# Patient Record
Sex: Male | Born: 1945
Health system: Southern US, Community
[De-identification: ages and names within clinical notes are randomized; demographics above are authoritative.]

## PROBLEM LIST (undated history)

## (undated) DIAGNOSIS — K219 Gastro-esophageal reflux disease without esophagitis: Secondary | ICD-10-CM

## (undated) DIAGNOSIS — E785 Hyperlipidemia, unspecified: Secondary | ICD-10-CM

## (undated) DIAGNOSIS — I10 Essential (primary) hypertension: Secondary | ICD-10-CM

## (undated) DIAGNOSIS — K589 Irritable bowel syndrome without diarrhea: Secondary | ICD-10-CM

## (undated) DIAGNOSIS — M1990 Unspecified osteoarthritis, unspecified site: Secondary | ICD-10-CM

## (undated) DIAGNOSIS — F419 Anxiety disorder, unspecified: Secondary | ICD-10-CM

## (undated) DIAGNOSIS — S83242A Other tear of medial meniscus, current injury, left knee, initial encounter: Secondary | ICD-10-CM

## (undated) HISTORY — PX: NASAL SINUS SURGERY: SHX719

## (undated) HISTORY — PX: CHOLECYSTECTOMY: SHX55

## (undated) HISTORY — DX: Essential (primary) hypertension: I10

## (undated) HISTORY — DX: Hyperlipidemia, unspecified: E78.5

## (undated) HISTORY — DX: Irritable bowel syndrome, unspecified: K58.9

---

## 2002-05-10 ENCOUNTER — Ambulatory Visit (HOSPITAL_BASED_OUTPATIENT_CLINIC_OR_DEPARTMENT_OTHER): Admission: RE | Admit: 2002-05-10 | Discharge: 2002-05-10 | Payer: Self-pay | Admitting: *Deleted

## 2002-10-14 ENCOUNTER — Encounter: Payer: Self-pay | Admitting: Emergency Medicine

## 2002-10-14 ENCOUNTER — Inpatient Hospital Stay (HOSPITAL_COMMUNITY): Admission: AD | Admit: 2002-10-14 | Discharge: 2002-10-15 | Payer: Self-pay | Admitting: Emergency Medicine

## 2002-10-15 ENCOUNTER — Encounter: Payer: Self-pay | Admitting: Internal Medicine

## 2004-02-05 ENCOUNTER — Ambulatory Visit: Payer: Self-pay | Admitting: Family Medicine

## 2004-04-20 ENCOUNTER — Ambulatory Visit: Payer: Self-pay | Admitting: Family Medicine

## 2004-05-03 ENCOUNTER — Ambulatory Visit: Payer: Self-pay | Admitting: Family Medicine

## 2004-05-31 ENCOUNTER — Ambulatory Visit: Payer: Self-pay | Admitting: Family Medicine

## 2004-08-20 ENCOUNTER — Ambulatory Visit: Payer: Self-pay | Admitting: Family Medicine

## 2004-11-25 ENCOUNTER — Ambulatory Visit: Payer: Self-pay | Admitting: Family Medicine

## 2005-01-17 ENCOUNTER — Ambulatory Visit: Payer: Self-pay | Admitting: Family Medicine

## 2005-03-24 ENCOUNTER — Ambulatory Visit: Payer: Self-pay | Admitting: Family Medicine

## 2005-04-19 ENCOUNTER — Ambulatory Visit: Payer: Self-pay | Admitting: Family Medicine

## 2005-05-20 ENCOUNTER — Ambulatory Visit: Payer: Self-pay | Admitting: Family Medicine

## 2005-08-17 ENCOUNTER — Ambulatory Visit: Payer: Self-pay | Admitting: Family Medicine

## 2005-09-06 ENCOUNTER — Ambulatory Visit: Payer: Self-pay | Admitting: Family Medicine

## 2006-03-21 ENCOUNTER — Ambulatory Visit: Payer: Self-pay | Admitting: Family Medicine

## 2006-04-03 ENCOUNTER — Ambulatory Visit: Payer: Self-pay | Admitting: Family Medicine

## 2006-04-03 LAB — CONVERTED CEMR LAB
ALT: 25 units/L (ref 0–40)
AST: 22 units/L (ref 0–37)
Albumin: 3.9 g/dL (ref 3.5–5.2)
Calcium: 8.9 mg/dL (ref 8.4–10.5)
Chloride: 105 meq/L (ref 96–112)
Creatinine, Ser: 1 mg/dL (ref 0.4–1.5)
GFR calc non Af Amer: 81 mL/min
Total Bilirubin: 0.7 mg/dL (ref 0.3–1.2)

## 2006-07-04 DIAGNOSIS — K219 Gastro-esophageal reflux disease without esophagitis: Secondary | ICD-10-CM | POA: Insufficient documentation

## 2006-07-04 DIAGNOSIS — J330 Polyp of nasal cavity: Secondary | ICD-10-CM

## 2006-07-04 DIAGNOSIS — IMO0001 Reserved for inherently not codable concepts without codable children: Secondary | ICD-10-CM

## 2006-07-28 ENCOUNTER — Encounter: Payer: Self-pay | Admitting: Family Medicine

## 2006-09-05 ENCOUNTER — Telehealth (INDEPENDENT_AMBULATORY_CARE_PROVIDER_SITE_OTHER): Payer: Self-pay | Admitting: *Deleted

## 2006-10-24 ENCOUNTER — Ambulatory Visit: Payer: Self-pay | Admitting: Family Medicine

## 2006-10-24 DIAGNOSIS — N410 Acute prostatitis: Secondary | ICD-10-CM | POA: Insufficient documentation

## 2006-10-24 DIAGNOSIS — R319 Hematuria, unspecified: Secondary | ICD-10-CM | POA: Insufficient documentation

## 2006-10-24 LAB — CONVERTED CEMR LAB
Bilirubin Urine: NEGATIVE
Glucose, Urine, Semiquant: NEGATIVE
Ketones, urine, test strip: NEGATIVE
Nitrite: NEGATIVE
Protein, U semiquant: NEGATIVE
Specific Gravity, Urine: 1.015
Urobilinogen, UA: NEGATIVE
WBC Urine, dipstick: NEGATIVE
pH: 6

## 2006-10-31 ENCOUNTER — Encounter (INDEPENDENT_AMBULATORY_CARE_PROVIDER_SITE_OTHER): Payer: Self-pay | Admitting: *Deleted

## 2006-10-31 LAB — CONVERTED CEMR LAB
Basophils Absolute: 0 10*3/uL (ref 0.0–0.1)
HCT: 47.1 % (ref 39.0–52.0)
Hemoglobin: 16.5 g/dL (ref 13.0–17.0)
Lymphocytes Relative: 20.2 % (ref 12.0–46.0)
MCHC: 35 g/dL (ref 30.0–36.0)
MCV: 91.7 fL (ref 78.0–100.0)
Monocytes Absolute: 0.8 10*3/uL — ABNORMAL HIGH (ref 0.2–0.7)
Neutro Abs: 6.3 10*3/uL (ref 1.4–7.7)
Neutrophils Relative %: 65.9 % (ref 43.0–77.0)
RDW: 12.8 % (ref 11.5–14.6)

## 2006-11-01 ENCOUNTER — Telehealth: Payer: Self-pay | Admitting: Family Medicine

## 2006-11-07 ENCOUNTER — Telehealth (INDEPENDENT_AMBULATORY_CARE_PROVIDER_SITE_OTHER): Payer: Self-pay | Admitting: *Deleted

## 2006-11-07 ENCOUNTER — Ambulatory Visit: Payer: Self-pay | Admitting: Family Medicine

## 2006-11-09 ENCOUNTER — Encounter (INDEPENDENT_AMBULATORY_CARE_PROVIDER_SITE_OTHER): Payer: Self-pay | Admitting: *Deleted

## 2006-11-29 ENCOUNTER — Ambulatory Visit: Payer: Self-pay | Admitting: Family Medicine

## 2006-12-01 ENCOUNTER — Telehealth (INDEPENDENT_AMBULATORY_CARE_PROVIDER_SITE_OTHER): Payer: Self-pay | Admitting: *Deleted

## 2006-12-07 LAB — CONVERTED CEMR LAB
AST: 27 units/L (ref 0–37)
Bilirubin, Direct: 0.1 mg/dL (ref 0.0–0.3)
CO2: 28 meq/L (ref 19–32)
Chloride: 105 meq/L (ref 96–112)
Cholesterol: 135 mg/dL (ref 0–200)
Creatinine, Ser: 1.1 mg/dL (ref 0.4–1.5)
Creatinine,U: 81.5 mg/dL
Glucose, Bld: 152 mg/dL — ABNORMAL HIGH (ref 70–99)
HDL: 51.8 mg/dL (ref >39.0)
Sodium: 139 meq/L (ref 135–145)
Total Bilirubin: 1 mg/dL (ref 0.3–1.2)

## 2006-12-15 ENCOUNTER — Telehealth: Payer: Self-pay | Admitting: Family Medicine

## 2007-01-08 ENCOUNTER — Telehealth (INDEPENDENT_AMBULATORY_CARE_PROVIDER_SITE_OTHER): Payer: Self-pay | Admitting: *Deleted

## 2007-01-22 ENCOUNTER — Ambulatory Visit: Payer: Self-pay | Admitting: Family Medicine

## 2007-01-22 DIAGNOSIS — J069 Acute upper respiratory infection, unspecified: Secondary | ICD-10-CM | POA: Insufficient documentation

## 2007-02-05 ENCOUNTER — Telehealth (INDEPENDENT_AMBULATORY_CARE_PROVIDER_SITE_OTHER): Payer: Self-pay | Admitting: *Deleted

## 2007-02-06 ENCOUNTER — Ambulatory Visit: Payer: Self-pay | Admitting: Internal Medicine

## 2007-02-06 DIAGNOSIS — J019 Acute sinusitis, unspecified: Secondary | ICD-10-CM

## 2007-03-27 ENCOUNTER — Ambulatory Visit: Payer: Self-pay | Admitting: Family Medicine

## 2007-05-15 ENCOUNTER — Ambulatory Visit: Payer: Self-pay | Admitting: Family Medicine

## 2007-05-20 ENCOUNTER — Encounter (INDEPENDENT_AMBULATORY_CARE_PROVIDER_SITE_OTHER): Payer: Self-pay | Admitting: *Deleted

## 2007-05-22 ENCOUNTER — Encounter (INDEPENDENT_AMBULATORY_CARE_PROVIDER_SITE_OTHER): Payer: Self-pay | Admitting: *Deleted

## 2007-07-03 ENCOUNTER — Encounter (INDEPENDENT_AMBULATORY_CARE_PROVIDER_SITE_OTHER): Payer: Self-pay | Admitting: *Deleted

## 2007-07-03 ENCOUNTER — Telehealth (INDEPENDENT_AMBULATORY_CARE_PROVIDER_SITE_OTHER): Payer: Self-pay | Admitting: *Deleted

## 2007-07-05 ENCOUNTER — Ambulatory Visit: Payer: Self-pay | Admitting: Internal Medicine

## 2007-07-30 ENCOUNTER — Telehealth (INDEPENDENT_AMBULATORY_CARE_PROVIDER_SITE_OTHER): Payer: Self-pay | Admitting: *Deleted

## 2007-07-30 ENCOUNTER — Encounter (INDEPENDENT_AMBULATORY_CARE_PROVIDER_SITE_OTHER): Payer: Self-pay | Admitting: *Deleted

## 2007-09-03 ENCOUNTER — Telehealth (INDEPENDENT_AMBULATORY_CARE_PROVIDER_SITE_OTHER): Payer: Self-pay | Admitting: *Deleted

## 2007-10-09 ENCOUNTER — Telehealth (INDEPENDENT_AMBULATORY_CARE_PROVIDER_SITE_OTHER): Payer: Self-pay | Admitting: *Deleted

## 2007-11-06 ENCOUNTER — Ambulatory Visit: Payer: Self-pay | Admitting: Family Medicine

## 2007-11-14 ENCOUNTER — Telehealth (INDEPENDENT_AMBULATORY_CARE_PROVIDER_SITE_OTHER): Payer: Self-pay | Admitting: *Deleted

## 2007-11-21 ENCOUNTER — Ambulatory Visit: Payer: Self-pay | Admitting: Internal Medicine

## 2008-01-01 ENCOUNTER — Telehealth (INDEPENDENT_AMBULATORY_CARE_PROVIDER_SITE_OTHER): Payer: Self-pay | Admitting: *Deleted

## 2008-01-07 ENCOUNTER — Telehealth (INDEPENDENT_AMBULATORY_CARE_PROVIDER_SITE_OTHER): Payer: Self-pay | Admitting: *Deleted

## 2008-03-13 ENCOUNTER — Encounter: Payer: Self-pay | Admitting: Family Medicine

## 2008-03-28 ENCOUNTER — Ambulatory Visit: Payer: Self-pay | Admitting: Family Medicine

## 2008-04-04 ENCOUNTER — Encounter: Payer: Self-pay | Admitting: Family Medicine

## 2008-04-21 ENCOUNTER — Telehealth (INDEPENDENT_AMBULATORY_CARE_PROVIDER_SITE_OTHER): Payer: Self-pay | Admitting: *Deleted

## 2008-05-01 ENCOUNTER — Encounter: Payer: Self-pay | Admitting: Family Medicine

## 2008-05-13 ENCOUNTER — Ambulatory Visit: Payer: Self-pay | Admitting: Family Medicine

## 2008-05-13 DIAGNOSIS — I10 Essential (primary) hypertension: Secondary | ICD-10-CM | POA: Insufficient documentation

## 2008-05-13 DIAGNOSIS — E559 Vitamin D deficiency, unspecified: Secondary | ICD-10-CM | POA: Insufficient documentation

## 2008-05-13 DIAGNOSIS — E785 Hyperlipidemia, unspecified: Secondary | ICD-10-CM

## 2008-05-29 LAB — CONVERTED CEMR LAB
Albumin: 4.1 g/dL (ref 3.5–5.2)
Alkaline Phosphatase: 44 units/L (ref 39–117)
Basophils Absolute: 0 10*3/uL (ref 0.0–0.1)
CO2: 28 meq/L (ref 19–32)
CRP, High Sensitivity: 2 (ref 0.00–5.00)
Calcium: 8.7 mg/dL (ref 8.4–10.5)
Creatinine,U: 93.9 mg/dL
Eosinophils Absolute: 0.5 10*3/uL (ref 0.0–0.7)
Glucose, Bld: 159 mg/dL — ABNORMAL HIGH (ref 70–99)
HDL: 43.1 mg/dL (ref >39.00)
Hemoglobin: 15.7 g/dL (ref 13.0–17.0)
Hgb A1c MFr Bld: 6.7 % — ABNORMAL HIGH (ref 4.6–6.5)
Lymphocytes Relative: 22.5 % (ref 12.0–46.0)
Lymphs Abs: 1.6 10*3/uL (ref 0.7–4.0)
MCHC: 34.3 g/dL (ref 30.0–36.0)
Microalb, Ur: 0.1 mg/dL (ref 0.0–1.9)
Monocytes Absolute: 0.6 10*3/uL (ref 0.1–1.0)
Neutro Abs: 4.4 10*3/uL (ref 1.4–7.7)
RDW: 12.3 % (ref 11.5–14.6)
Sodium: 141 meq/L (ref 135–145)
Triglycerides: 83 mg/dL (ref 0.0–149.0)

## 2008-05-30 ENCOUNTER — Encounter (INDEPENDENT_AMBULATORY_CARE_PROVIDER_SITE_OTHER): Payer: Self-pay | Admitting: *Deleted

## 2008-06-13 ENCOUNTER — Telehealth (INDEPENDENT_AMBULATORY_CARE_PROVIDER_SITE_OTHER): Payer: Self-pay | Admitting: *Deleted

## 2008-08-06 ENCOUNTER — Telehealth (INDEPENDENT_AMBULATORY_CARE_PROVIDER_SITE_OTHER): Payer: Self-pay | Admitting: *Deleted

## 2008-09-02 ENCOUNTER — Ambulatory Visit: Payer: Self-pay | Admitting: Family Medicine

## 2008-09-03 ENCOUNTER — Encounter (INDEPENDENT_AMBULATORY_CARE_PROVIDER_SITE_OTHER): Payer: Self-pay | Admitting: *Deleted

## 2008-09-03 ENCOUNTER — Encounter: Payer: Self-pay | Admitting: Family Medicine

## 2008-09-07 LAB — CONVERTED CEMR LAB: Vit D, 25-Hydroxy: 42 ng/mL (ref 30–89)

## 2008-09-09 ENCOUNTER — Telehealth (INDEPENDENT_AMBULATORY_CARE_PROVIDER_SITE_OTHER): Payer: Self-pay | Admitting: *Deleted

## 2008-09-17 ENCOUNTER — Encounter (INDEPENDENT_AMBULATORY_CARE_PROVIDER_SITE_OTHER): Payer: Self-pay | Admitting: *Deleted

## 2008-09-19 ENCOUNTER — Telehealth (INDEPENDENT_AMBULATORY_CARE_PROVIDER_SITE_OTHER): Payer: Self-pay | Admitting: *Deleted

## 2008-11-05 ENCOUNTER — Telehealth (INDEPENDENT_AMBULATORY_CARE_PROVIDER_SITE_OTHER): Payer: Self-pay | Admitting: *Deleted

## 2008-11-18 ENCOUNTER — Encounter: Payer: Self-pay | Admitting: Family Medicine

## 2009-02-17 ENCOUNTER — Encounter: Payer: Self-pay | Admitting: Family Medicine

## 2009-02-19 ENCOUNTER — Telehealth (INDEPENDENT_AMBULATORY_CARE_PROVIDER_SITE_OTHER): Payer: Self-pay | Admitting: *Deleted

## 2009-02-24 ENCOUNTER — Encounter: Payer: Self-pay | Admitting: Family Medicine

## 2009-02-24 ENCOUNTER — Ambulatory Visit: Payer: Self-pay | Admitting: Family

## 2009-02-25 LAB — CONVERTED CEMR LAB: Hgb A1c MFr Bld: 5.7 % (ref 4.6–6.5)

## 2009-02-27 ENCOUNTER — Telehealth: Payer: Self-pay | Admitting: Family Medicine

## 2009-03-02 LAB — CONVERTED CEMR LAB: Vit D, 25-Hydroxy: 47 ng/mL (ref 30–89)

## 2009-03-04 ENCOUNTER — Encounter: Payer: Self-pay | Admitting: Family Medicine

## 2009-03-17 ENCOUNTER — Ambulatory Visit: Payer: Self-pay | Admitting: Family Medicine

## 2009-04-08 ENCOUNTER — Encounter: Payer: Self-pay | Admitting: Family Medicine

## 2009-05-26 ENCOUNTER — Ambulatory Visit: Payer: Self-pay | Admitting: Family Medicine

## 2009-05-26 DIAGNOSIS — H6120 Impacted cerumen, unspecified ear: Secondary | ICD-10-CM

## 2009-05-27 ENCOUNTER — Ambulatory Visit: Payer: Self-pay | Admitting: Family Medicine

## 2009-05-28 LAB — CONVERTED CEMR LAB
ALT: 21 units/L (ref 0–53)
Alkaline Phosphatase: 41 units/L (ref 39–117)
BUN: 18 mg/dL (ref 6–23)
Bilirubin, Direct: 0 mg/dL (ref 0.0–0.3)
Cholesterol: 113 mg/dL (ref 0–200)
Creatinine, Ser: 1.1 mg/dL (ref 0.4–1.5)
GFR calc non Af Amer: 71.44 mL/min (ref >60)
Glucose, Bld: 110 mg/dL — ABNORMAL HIGH (ref 70–99)
Hgb A1c MFr Bld: 5.8 % (ref 4.6–6.5)
Total Bilirubin: 0.4 mg/dL (ref 0.3–1.2)
Total Protein: 7.1 g/dL (ref 6.0–8.3)
Vit D, 25-Hydroxy: 42 ng/mL (ref 30–89)

## 2009-06-04 ENCOUNTER — Telehealth (INDEPENDENT_AMBULATORY_CARE_PROVIDER_SITE_OTHER): Payer: Self-pay | Admitting: *Deleted

## 2009-07-31 ENCOUNTER — Telehealth (INDEPENDENT_AMBULATORY_CARE_PROVIDER_SITE_OTHER): Payer: Self-pay | Admitting: *Deleted

## 2009-09-21 ENCOUNTER — Encounter: Payer: Self-pay | Admitting: Family Medicine

## 2009-11-03 ENCOUNTER — Ambulatory Visit: Payer: Self-pay | Admitting: Family Medicine

## 2009-11-03 DIAGNOSIS — R350 Frequency of micturition: Secondary | ICD-10-CM

## 2009-11-03 DIAGNOSIS — M25519 Pain in unspecified shoulder: Secondary | ICD-10-CM

## 2009-11-03 LAB — CONVERTED CEMR LAB
Nitrite: NEGATIVE
Specific Gravity, Urine: 1.01
Urobilinogen, UA: 0.2
WBC Urine, dipstick: NEGATIVE

## 2009-11-06 ENCOUNTER — Telehealth (INDEPENDENT_AMBULATORY_CARE_PROVIDER_SITE_OTHER): Payer: Self-pay | Admitting: *Deleted

## 2009-11-10 LAB — CONVERTED CEMR LAB
BUN: 19 mg/dL (ref 6–23)
Basophils Absolute: 0.1 10*3/uL (ref 0.0–0.1)
Chloride: 101 meq/L (ref 96–112)
Creatinine, Ser: 1.2 mg/dL (ref 0.4–1.5)
Eosinophils Absolute: 0.2 10*3/uL (ref 0.0–0.7)
Eosinophils Relative: 2.8 % (ref 0.0–5.0)
Glucose, Bld: 134 mg/dL — ABNORMAL HIGH (ref 70–99)
HCT: 47.1 % (ref 39.0–52.0)
Lymphs Abs: 1.6 10*3/uL (ref 0.7–4.0)
MCHC: 34.6 g/dL (ref 30.0–36.0)
MCV: 92.2 fL (ref 78.0–100.0)
Monocytes Absolute: 0.8 10*3/uL (ref 0.1–1.0)
Neutrophils Relative %: 65.3 % (ref 43.0–77.0)
PSA: 0.52 ng/mL (ref 0.10–4.00)
Platelets: 181 10*3/uL (ref 150.0–400.0)
Potassium: 5 meq/L (ref 3.5–5.1)
RDW: 13.1 % (ref 11.5–14.6)
WBC: 7.6 10*3/uL (ref 4.5–10.5)

## 2009-11-30 ENCOUNTER — Ambulatory Visit: Payer: Self-pay | Admitting: Family Medicine

## 2009-12-01 ENCOUNTER — Encounter: Payer: Self-pay | Admitting: Family Medicine

## 2009-12-02 LAB — CONVERTED CEMR LAB: Hgb A1c MFr Bld: 6 % (ref 4.6–6.5)

## 2009-12-03 ENCOUNTER — Encounter: Payer: Self-pay | Admitting: Family Medicine

## 2009-12-14 ENCOUNTER — Telehealth: Payer: Self-pay | Admitting: Family Medicine

## 2009-12-14 ENCOUNTER — Telehealth (INDEPENDENT_AMBULATORY_CARE_PROVIDER_SITE_OTHER): Payer: Self-pay | Admitting: *Deleted

## 2009-12-15 ENCOUNTER — Telehealth: Payer: Self-pay | Admitting: Family Medicine

## 2009-12-16 ENCOUNTER — Ambulatory Visit: Payer: Self-pay | Admitting: Internal Medicine

## 2010-01-04 ENCOUNTER — Telehealth: Payer: Self-pay | Admitting: Family Medicine

## 2010-01-06 ENCOUNTER — Encounter: Payer: Self-pay | Admitting: Family Medicine

## 2010-02-10 ENCOUNTER — Ambulatory Visit
Admission: RE | Admit: 2010-02-10 | Discharge: 2010-02-10 | Payer: Self-pay | Source: Home / Self Care | Attending: Family Medicine | Admitting: Family Medicine

## 2010-02-10 DIAGNOSIS — N951 Menopausal and female climacteric states: Secondary | ICD-10-CM | POA: Insufficient documentation

## 2010-03-07 LAB — CONVERTED CEMR LAB
Albumin: 3.9 g/dL (ref 3.5–5.2)
Albumin: 4.2 g/dL (ref 3.5–5.2)
BUN: 8 mg/dL (ref 6–23)
CO2: 28 meq/L (ref 19–32)
Calcium: 8.8 mg/dL (ref 8.4–10.5)
Creatinine, Ser: 1.1 mg/dL (ref 0.4–1.5)
Creatinine, Ser: 1.1 mg/dL (ref 0.4–1.5)
GFR calc Af Amer: 87 mL/min
GFR calc non Af Amer: 72 mL/min
Hgb A1c MFr Bld: 6.2 % — ABNORMAL HIGH (ref 4.6–6.0)
Ketones, urine, test strip: NEGATIVE
Nitrite: NEGATIVE
Potassium: 3.8 meq/L (ref 3.5–5.1)
Sodium: 138 meq/L (ref 135–145)
Total Bilirubin: 0.9 mg/dL (ref 0.3–1.2)
Total Protein: 6.7 g/dL (ref 6.0–8.3)
Urobilinogen, UA: NEGATIVE
pH: 5

## 2010-03-09 NOTE — Consult Note (Signed)
Summary: [facility]  [facility]   Imported By: Lanelle Bal 12/10/2009 11:39:58  _____________________________________________________________________  External Attachment:    Type:   Image     Comment:   External Document

## 2010-03-09 NOTE — Progress Notes (Signed)
Summary: inhaler  Phone Note Call from Patient Call back at Work Phone [phone]   Caller: Patient Complaint: Breathing Problems Summary of Call: Pt would like to know if you would rx a inhaler as well. Pt c/o some wheezing with cough. [facility] uses [facility] [address]. Pls advise...............Marland KitchenFelecia Deloach CMA  December 15, 2009 1:08 PM   Follow-up for Phone Call        he would have to be seen to get an inhaler Follow-up by: Loreen Freud DO,  December 15, 2009 1:39 PM  Additional Follow-up for Phone Call Additional follow up Details #1::        Advised pt he will need an OV, he said he wanted to try the humidifier first and continue to take the meds  and see if he gets better, if not he will come in for an appt...Marland KitchenMarland KitchenMarland Kitchen  Call ended

## 2010-03-09 NOTE — Progress Notes (Signed)
Summary: refills  Phone Note Refill Request Call back at Work Phone [phone]   Refills Requested: Medication #1:  SPIRONOLACTONE 25 MG  TABS 1 by mouth once daily   Supply Requested: 3 months  Medication #2:  AMARYL 4 MG  TABS 1 by mouth once daily   Supply Requested: 3 months  Medication #3:  NEXIUM 40 MG 1 by mouth once daily   Supply Requested: 3 months  Medication #4:  ACCUPRIL 20 MG  TABS 1 by mouth once daily   Supply Requested: 3 months norvasc 10mg , onglyza 5 mg . [facility]..................................Marland KitchenFelecia Deloach CMA  December 14, 2009 9:47 AM      Prescriptions: ONGLYZA 5 MG TABS (SAXAGLIPTIN HCL) Take one tablet daily.  #90 x 1   Entered by:   Almeta Monas CMA (AAMA)   Authorized by:   Loreen Freud DO   Signed by:   Almeta Monas CMA (AAMA) on 12/15/2009   Method used:   Faxed to ...       [facility]       [address] [place], [place]  [address]       Ph: [phone]       Fax: [phone]   RxID:   [id number] AMARYL 4 MG  TABS (GLIMEPIRIDE) 1 by mouth once daily  #90 x 1   Entered by:   Almeta Monas CMA (AAMA)   Authorized by:   Loreen Freud DO   Signed by:   Almeta Monas CMA (AAMA) on 12/15/2009   Method used:   Faxed to ...       [facility] (mail-order)       [address] [place], [place]  [address]       Ph: [phone]       Fax: [phone]   RxID:   [id number] NEXIUM 40 MG 1 by mouth once daily  #90 x 1   Entered by:   Almeta Monas CMA (AAMA)   Authorized by:   Loreen Freud DO   Signed by:   Almeta Monas CMA (AAMA) on 12/15/2009   Method used:   Faxed to ...       [facility] (mail-order)       [address] [place], [place]  [address]       Ph: [phone]       Fax: [phone]   RxID:   [id number] ACCUPRIL 20 MG  TABS (QUINAPRIL HCL) 1 by mouth once daily  #90 x 1   Entered by:   Almeta Monas CMA (AAMA)   Authorized by:   Loreen Freud DO   Signed by:   Almeta Monas  CMA (AAMA) on 12/15/2009   Method used:   Faxed to ...       [facility] (mail[facility]order)       [address] [place], [place]  [address]       Ph: [phone]       Fax: [phone]   RxID:   [id number] NORVASC 10 MG  TABS (AMLODIPINE BESYLATE) 1 by mouth once daily  #90 x 1   Entered by:   Almeta Monas CMA (AAMA)   Authorized by:   Loreen Freud DO   Signed by:   Almeta Monas CMA (AAMA) on 12/15/2009   Method used:   Faxed to .Marland KitchenMarland Kitchen  [facility] (mail-order)       [address] [place], [place]  [address]       Ph: [phone]       Fax: [phone]   RxID:   [id number]

## 2010-03-09 NOTE — Consult Note (Signed)
Summary: [facility]  [facility]   Imported By: Lanelle Bal 01/14/2010 15:52:57  _____________________________________________________________________  External Attachment:    Type:   Image     Comment:   External Document

## 2010-03-09 NOTE — Progress Notes (Signed)
Summary: QUESTIONS ON LABS FOR 1/18; ALSO PRESCRIPTION REFILLS  Phone Note Call from Patient Call back at [facility] Phone [phone]   Caller: Patient Summary of Call: PATIENT IS SCHEDULED FOR LABS FOR 1/18 AT 10 AM----I SEE FROM OTHER LABS HE NEEDS VIT D RECHECK (WHAT DIAG CODE), HE NEEDS HGBA1C (250.00?) ---IS THERE ANY OTHER  LAB THAT NEEDS TO BE ENTERED?  MICROALBUMIN?  ALSO SAYS NEXIUM, QUINOPRIL 20 MG, GLIMEPIRIDE 4 MG, SPIRONOLACTONE 25 MG, ACTOS 45 MG, ONGLYSA 5 MG  PRESCRIPTION HAVE  EXPIRED OR BEEN FILLED FOR THE LAST REFILL---NEEDS NEW PRESCRIPTIONS   ALSO SAYS AMLODIPINE HAS TWO REFILLS LEFT---CAN A NEW PRESCRIPTION BE WRITTEN FOR IT SO HE CAN GET ALL HIS PRESCRIPTIONS AT THE SAME TIME---CALL HIM WHEN ALL THESE PRESCRIPTIONS ARE READY TO BE PICKED UP Initial call taken by: Jerolyn Shin,  February 19, 2009 12:50 PM  Follow-up for Phone Call        2500.0,268.9 vitamin d,a1c other labs not due until 05/2009.  left message to call  office. ..............Marland KitchenFelecia Deloach CMA  February 19, 2009 2:58 PM pt called back pt recently changed insurance company and mail order needs new rx sent to new pharmacy. pt aware rx sent to pharmacy.orders and codes added to labs...........Marland KitchenFelecia Deloach CMA  February 19, 2009 3:24 PM     Prescriptions: AMARYL 4 MG  TABS (GLIMEPIRIDE) 1 by mouth once daily  #90 x 0   Entered by:   Jeremy Johann CMA   Authorized by:   Loreen Freud DO   Signed by:   Jeremy Johann CMA on 02/19/2009   Method used:   Faxed to ...       [facility] (mail-order)       [address] [place], [place]  [address]       Ph: [phone]       Fax: [phone]   RxID:   [id number] NEXIUM 40 MG 1 by mouth once daily  #90 x 0   Entered by:   Jeremy Johann CMA   Authorized by:   Loreen Freud DO   Signed by:   Jeremy Johann CMA on 02/19/2009   Method used:   Faxed to ...       [facility] (mail-order)       [address] [place], [place]  [address]       Ph:  [phone]       Fax: [phone]   RxID:   [id number] ONGLYZA 5 MG TABS (SAXAGLIPTIN HCL) Take one tablet daily.  #90 x 0   Entered by:   Jeremy Johann CMA   Authorized by:   Loreen Freud DO   Signed by:   Jeremy Johann CMA on 02/19/2009   Method used:   Faxed to ...       [facility] (mail-order)       [address] [place], [place]  [address]       Ph: [phone]       Fax: [phone]   RxID:   [id number]

## 2010-03-09 NOTE — Progress Notes (Signed)
Summary: refills  Phone Note Refill Request Call back at Work Phone [phone] Message from:  Patient  Refills Requested: Medication #1:  SPIRONOLACTONE 25 MG  TABS 1 by mouth once daily   Supply Requested: 3 months  90-day [facility] Pt currently out of med and need 30-day supply sent to local pharmacy [facility] [address]............Marland KitchenFelecia Deloach CMA  January 04, 2010 8:32 AM    Follow-up for Phone Call        ok to fill 30 day x1 Follow-up by: Loreen Freud DO,  January 04, 2010 10:58 AM    Prescriptions: SPIRONOLACTONE 25 MG  TABS (SPIRONOLACTONE) 1 by mouth once daily  #30 x 1   Entered by:   Almeta Monas CMA (AAMA)   Authorized by:   Loreen Freud DO   Signed by:   Almeta Monas CMA (AAMA) on 01/04/2010   Method used:   Electronically to        [facility]  [address] [id number]* (retail)       [address]       [place], [place]  [address]       Ph: [phone] or [phone]       Fax: [phone]   RxID:   [id number]

## 2010-03-09 NOTE — Assessment & Plan Note (Signed)
Summary: SINUS INFECTION/[facility]   Vital Signs:  Patient profile:   65 year old male Weight:      229 pounds Temp:     97.9 degrees F oral Pulse rate:   66 / minute Pulse rhythm:   regular BP sitting:   126 / 80  (left arm) Cuff size:   large  Vitals Entered By: Army Fossa CMA (May 26, 2009 1:57 PM) CC: Pt c/o left side of face hurt, pressure in ear. , URI symptoms   History of Present Illness:       This is a 65 year old man who presents with URI symptoms.  The symptoms began 2 days ago.  Pt here c/o B/L sinus tenderness.  Pt is using Neti pot with little relief.  The patient complains of nasal congestion, purulent nasal discharge, earache, and sick contacts, but denies dry cough and productive cough.  The patient denies fever, low-grade fever (<100.5 degrees), fever of 100.5-103 degrees, fever of 103.1-104 degrees, fever to >104 degrees, stiff neck, dyspnea, wheezing, rash, vomiting, diarrhea, use of an antipyretic, and response to antipyretic.  The patient denies itchy watery eyes, itchy throat, sneezing, seasonal symptoms, response to antihistamine, headache, muscle aches, and severe fatigue.  The patient denies the following risk factors for Strep sinusitis: unilateral facial pain, unilateral nasal discharge, poor response to decongestant, double sickening, tooth pain, Strep exposure, tender adenopathy, and absence of cough.    Current Medications (verified): 1)  Actos 30 Mg Tabs (Pioglitazone Hcl) .... Take 1 Tab Once Daily 2)  Allegra 180 Mg  Tabs (Fexofenadine Hcl) .Marland Kitchen.. 1 By Mouth Once Daily 3)  Norvasc 10 Mg  Tabs (Amlodipine Besylate) .Marland Kitchen.. 1 By Mouth Once Daily 4)  Accupril 20 Mg  Tabs (Quinapril Hcl) .Marland Kitchen.. 1 By Mouth Once Daily 5)  Advicor 1000-40 Mg  Tb24 (Niacin-Lovastatin) .Marland Kitchen.. 1 By Mouth At Bedtime 6)  Nexium 40 Mg .Marland Kitchen.. 1 By Mouth Once Daily 7)  Spironolactone 25 Mg  Tabs (Spironolactone) .Marland Kitchen.. 1 By Mouth Once Daily 8)  Ambien 10 Mg  Tabs (Zolpidem Tartrate) .Marland Kitchen.. 1 By  Mouth At Bedtime As Needed 9)  Amaryl 4 Mg  Tabs (Glimepiride) .Marland Kitchen.. 1 By Mouth Once Daily 10)  Veramyst 27.5 Mcg/spray Susp (Fluticasone Furoate) .... 2 Sprays Each Nostril Once Daily 11)  Onglyza 5 Mg Tabs (Saxagliptin Hcl) .... Take One Tablet Daily. 12)  Glucosamine-Chondroitin  Caps (Glucosamine-Chondroit-Vit C-Mn) .... Take 2 Tabs Daily 13)  Fish Oil  Oil (Fish Oil) .... Take One Tablet 14)  Cayenne Pepper 450 Mg Caps (Capsicum [facility])) .... 2 Capsules Daily 15)  Multivitamins  Tabs (Multiple Vitamin) .... 2 Tabs Daily  Allergies: 1)  ! Keflex  Past History:  Past medical, surgical, family and social histories (including risk factors) reviewed for relevance to current acute and chronic problems.  Past Medical History: Reviewed history from 02/06/2007 and no changes required. 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 Cholecystectomy  Family History: Reviewed history and no changes required.  Social History: Reviewed history from 07/04/2006 and no changes required. Former Smoker  Physical Exam  General:  Well-developed,well-nourished,in no acute distress; alert,appropriate and cooperative throughout examination Ears:  b/L cerumen impaction irrigated b/l with little results colace used in L ear with good results--- L ear clear Nose:  no external deformity, L maxillary sinus tenderness, and R maxillary sinus tenderness.   Mouth:  Oral mucosa and oropharynx without lesions or  exudates.  Teeth in good repair. Neck:  No deformities, masses, or tenderness noted. Lungs:  Normal respiratory effort, chest expands symmetrically. Lungs are clear to auscultation, no crackles or wheezes. Heart:  normal rate and no murmur.   Psych:  Oriented X3 and normally interactive.     Impression & Recommendations:  Problem # 1:  SINUSITIS- ACUTE-NOS (ICD-461.9)  His updated medication list for this  problem includes:    Veramyst 27.5 Mcg/spray Susp (Fluticasone furoate) .Marland Kitchen... 2 sprays each nostril once daily    Augmentin 875-125 Mg Tabs (Amoxicillin-pot clavulanate) .Marland Kitchen... 1 by mouth two times a day  Instructed on treatment. Call if symptoms persist or worsen.   Problem # 2:  CERUMEN IMPACTION, BILATERAL (ICD-380.4) L ear clear  con't debrox in R ear and rto as needed   Complete Medication List: 1)  Actos 30 Mg Tabs (Pioglitazone hcl) .... Take 1 tab once daily 2)  Allegra 180 Mg Tabs (Fexofenadine hcl) .Marland Kitchen.. 1 by mouth once daily 3)  Norvasc 10 Mg Tabs (Amlodipine besylate) .Marland Kitchen.. 1 by mouth once daily 4)  Accupril 20 Mg Tabs (Quinapril hcl) .Marland Kitchen.. 1 by mouth once daily 5)  Advicor 1000-40 Mg Tb24 (Niacin-lovastatin) .Marland Kitchen.. 1 by mouth at bedtime 6)  Nexium 40 Mg  .Marland KitchenMarland Kitchen. 1 by mouth once daily 7)  Spironolactone 25 Mg Tabs (Spironolactone) .Marland Kitchen.. 1 by mouth once daily 8)  Ambien 10 Mg Tabs (Zolpidem tartrate) .Marland Kitchen.. 1 by mouth at bedtime as needed 9)  Amaryl 4 Mg Tabs (Glimepiride) .Marland Kitchen.. 1 by mouth once daily 10)  Veramyst 27.5 Mcg/spray Susp (Fluticasone furoate) .... 2 sprays each nostril once daily 11)  Onglyza 5 Mg Tabs (Saxagliptin hcl) .... Take one tablet daily. 12)  Glucosamine-chondroitin Caps (Glucosamine-chondroit-vit c-mn) .... Take 2 tabs daily 13)  Fish Oil Oil (Fish oil) .... Take one tablet 14)  Cayenne Pepper 450 Mg Caps (Capsicum (cayenne)) .... 2 capsules daily 15)  Multivitamins Tabs (Multiple vitamin) .... 2 tabs daily 16)  Augmentin 875-125 Mg Tabs (Amoxicillin-pot clavulanate) .Marland Kitchen.. 1 by mouth two times a day Prescriptions: AUGMENTIN 875-125 MG TABS (AMOXICILLIN-POT CLAVULANATE) 1 by mouth two times a day  #20 x 0   Entered and Authorized by:   Loreen Freud DO   Signed by:   Loreen Freud DO on 05/26/2009   Method used:   Electronically to        [id number] (retail)       [address]       [place], [place]  [address]       Ph:  [phone] or [phone]       Fax: [phone]   RxID:   [id number]

## 2010-03-09 NOTE — Progress Notes (Signed)
Phone Note Call from Patient   Summary of Call: Pt called and needs Nexium, Actos, Spironolactone, Amryl, Norvasc, and Onglyza all sent to [facility]. Army Fossa CMA  June 04, 2009 11:26 AM     Prescriptions: ONGLYZA 5 MG TABS (SAXAGLIPTIN HCL) Take one tablet daily.  #90 x 1   Entered by:   Army Fossa CMA   Authorized by:   Loreen Freud DO   Signed by:   Army Fossa CMA on 06/04/2009   Method used:   Faxed to ...       [facility] (mail[facility]order)       [address] [place], [place]  [address]       Ph: [phone]       Fax: [phone]   RxID:   [id number] AMARYL 4 MG  TABS (GLIMEPIRIDE) 1 by mouth once daily  #90 x 1   Entered by:   Army Fossa CMA   Authorized by:   Loreen Freud DO   Signed by:   Army Fossa CMA on 06/04/2009   Method used:   Faxed to ...       [facility] (mail[facility]order)       [address] [place], [place]  [address]       Ph: [phone]       Fax: [phone]   RxID:   [id number] SPIRONOLACTONE 25 MG  TABS (SPIRONOLACTONE) 1 by mouth once daily  #90 x 1   Entered by:   Army Fossa CMA   Authorized by:   Loreen Freud DO   Signed by:   Army Fossa CMA on 06/04/2009   Method used:   Faxed to ...       [facility] (mail-order)       [address] [place], [place]  [address]       Ph: [phone]       Fax: [phone]   RxID:   [id number] NEXIUM 40 MG 1 by mouth once daily  #90 x 1   Entered by:   Army Fossa CMA   Authorized by:   Loreen Freud DO   Signed by:   Army Fossa CMA on 06/04/2009   Method used:   Faxed to ...       [facility] (mail-order)       [address] [place], [place]  [address]       Ph: [phone]       Fax: [phone]   RxID:   [id number] ACCUPRIL 20 MG  TABS (QUINAPRIL HCL) 1 by mouth once daily  #90 x 1   Entered by:   Army Fossa CMA   Authorized by:   Loreen Freud DO   Signed by:   Army Fossa CMA on 06/04/2009   Method used:   Faxed  to ...       [facility] (mail-order)       [address] [place], [place]  [address]       Ph: [phone]       Fax: [phone]   RxID:   [id number] NORVASC 10 MG  TABS (AMLODIPINE BESYLATE) 1 by mouth once daily  #90 x 1   Entered by:   Army Fossa CMA   Authorized by:   Loreen Freud DO   Signed by:   Army Fossa  CMA on 06/04/2009   Method used:   Faxed to ...       [facility] (mail-order)       [address] [place], [place]  [address]       Ph: [phone]       Fax: [phone]   RxID:   [id number] ACTOS 30 MG TABS (PIOGLITAZONE HCL) take 1 tab once daily  #90 x 1   Entered by:   Army Fossa CMA   Authorized by:   Loreen Freud DO   Signed by:   Army Fossa CMA on 06/04/2009   Method used:   Faxed to ...       [facility] (mail-order)       [address] [place], [place]  [address]       Ph: [phone]       Fax: [phone]   RxID:   [id number]

## 2010-03-09 NOTE — Medication Information (Signed)
Summary: Possible Nonadherence with Amlodipine/[facility]  Possible Nonadherence with [facility]mlodipine/[facility]   Imported By: Lanelle Bal 03/11/2009 12:08:26  _____________________________________________________________________  External Attachment:    Type:   Image     Comment:   External Document

## 2010-03-09 NOTE — Assessment & Plan Note (Signed)
Summary: dr Laury Axon pat--still congested and coughing///sph   Vital Signs:  Patient profile:   65 year old male Weight:      235.6 pounds BMI:     37.03 O2 Sat:      94 % Temp:     98.4 degrees F oral Pulse rate:   80 / minute Resp:     17 per minute BP sitting:   124 / 78  (left arm) Cuff size:   large  Vitals Entered By: Shonna Chock CMA (December 16, 2009 2:57 PM) CC: Ongoing cough and congestion-on second round of ABX, Cough Comments Taking Mucinex for current symptoms   Primary Care Provider:  Laury Axon  CC:  Ongoing cough and congestion-on second round of ABX and Cough.  History of Present Illness: Cough      This is a 65 year old man who presents with Cough for 2-3 weeks.  The patient reports non-productive cough and wheezing, but denies pleuritic chest pain, shortness of breath, exertional dyspnea, fever, hemoptysis, and malaise.  Associated symtpoms include chronic rhinitis.  The patient denies the following symptoms: cold/URI symptoms, sore throat, nasal congestion, and acid reflux symptoms.  The cough is worse with lying down.  No past  history of asthma ; past  history of reflux.  Rx: Ceftin, Mucinex , Rx cough syrup.Marland Kitchen He is on Accupril. A1c was 6% recently; FBS average 120.No hypoglycemia.  Allergies: 1)  ! Keflex  Review of Systems General:  Denies chills and sweats. ENT:  Denies difficulty swallowing and hoarseness; No purulence, facial pain or frontal headache. GI:  Denies indigestion.  Physical Exam  General:  in no acute distress; alert,appropriate and cooperative throughout examination Ears:  External ear exam shows no significant lesions or deformities.  Otoscopic examination reveals clear canals, tympanic membranes are intact bilaterally without bulging, retraction, inflammation or discharge. Hearing is grossly normal bilaterally. Nose:  External nasal examination shows no deformity or inflammation. Nasal mucosa are pink and moist without lesions or  exudates. Mouth:  Oral mucosa and oropharynx without lesions or exudates.  Teeth in good repair. Lungs:  Normal respiratory effort, chest expands symmetrically. Lungs : scattered rhonchi , L > R Heart:  regular rhythm, no murmur, and tachycardia.   Skin:  Spotty erythematous lesions over face from Effudex Cervical Nodes:  No lymphadenopathy noted Axillary Nodes:  No palpable lymphadenopathy   Impression & Recommendations:  Problem # 1:  BRONCHITIS-ACUTE (ICD-466.0)  RAD component His updated medication list for this problem includes:    Cheratussin Ac 100-10 Mg/50ml Syrp (Guaifenesin-codeine) .Marland Kitchen... 1-2 tsp by mouth at bedtime as needed    Ceftin 500 Mg Tabs (Cefuroxime axetil) .Marland Kitchen... Take 1 by mouth two times a day for 10 days    Azithromycin 250 Mg Tabs (Azithromycin)    Dulera 200-5 Mcg/act Aero (Mometasone furo-formoterol fum) .Marland Kitchen... 1-2 puffs every 12 hrs as needed ; gargle & spit after use    Ventolin Hfa 108 (90 Base) Mcg/act Aers (Albuterol sulfate) .Marland Kitchen... 1-2 sprays every 4 hrs as needed    Singulair 10 Mg Tabs (Montelukast sodium) .Marland Kitchen... 1 once daily until cough resolves  Orders: T-2 View CXR (71020TC)  Problem # 2:  DIABETES MELLITUS (ICD-250.00) A1c 6% in 11/2009 His updated medication list for this problem includes:    Accupril 20 Mg Tabs (Quinapril hcl) .Marland Kitchen... 1 by mouth once daily    Amaryl 4 Mg Tabs (Glimepiride) .Marland Kitchen... 1/2  by mouth once daily (a1c)    Onglyza 5 Mg  Tabs (Saxagliptin hcl) .Marland Kitchen... Take one tablet daily.  Complete Medication List: 1)  Allegra 180 Mg Tabs (Fexofenadine hcl) .Marland Kitchen.. 1 by mouth once daily 2)  Norvasc 10 Mg Tabs (Amlodipine besylate) .Marland Kitchen.. 1 by mouth once daily 3)  Accupril 20 Mg Tabs (Quinapril hcl) .Marland Kitchen.. 1 by mouth once daily 4)  Advicor 1000-40 Mg Tb24 (Niacin-lovastatin) .Marland Kitchen.. 1 by mouth at bedtime 5)  Nexium 40 Mg  .Marland KitchenMarland Kitchen. 1 by mouth once daily 6)  Spironolactone 25 Mg Tabs (Spironolactone) .Marland Kitchen.. 1 by mouth once daily 7)  Ambien 10 Mg Tabs (Zolpidem  tartrate) .Marland Kitchen.. 1 by mouth at bedtime as needed 8)  Amaryl 4 Mg Tabs (Glimepiride) .... 1/2  by mouth once daily (a1c) 9)  Onglyza 5 Mg Tabs (Saxagliptin hcl) .... Take one tablet daily. 10)  Multivitamins Tabs (Multiple vitamin) .... 2 tabs daily 11)  Cheratussin Ac 100-10 Mg/37ml Syrp (Guaifenesin-codeine) .Marland Kitchen.. 1-2 tsp by mouth at bedtime as needed 12)  Nasonex 50 Mcg/act Susp (Mometasone furoate) .... 2 spray s each nostril once daily 13)  Ceftin 500 Mg Tabs (Cefuroxime axetil) .... Take 1 by mouth two times a day for 10 days 14)  Azithromycin 250 Mg Tabs (Azithromycin) 15)  Dulera 200-5 Mcg/act Aero (Mometasone furo-formoterol fum) .Marland Kitchen.. 1-2 puffs every 12 hrs as needed ; gargle & spit after use 16)  Ventolin Hfa 108 (90 Base) Mcg/act Aers (Albuterol sulfate) .Marland Kitchen.. 1-2 sprays every 4 hrs as needed 17)  Singulair 10 Mg Tabs (Montelukast sodium) .Marland Kitchen.. 1 once daily until cough resolves  Patient Instructions: 1)  Drink as much  NON dairy fluid as you can tolerate for the next few days. Prescriptions: ALLEGRA 180 MG  TABS (FEXOFENADINE HCL) 1 by mouth once daily  #90 x 1   Entered and Authorized by:   Marga Melnick MD   Signed by:   Marga Melnick MD on 12/16/2009   Method used:   Printed then faxed to ...       [facility]  [address] [id number]* (retail)       [address]       [place], [place]  [address]       Ph: [phone] or [phone]       Fax: [phone]   RxID:   [id number] SINGULAIR 10 MG TABS (MONTELUKAST SODIUM) 1 once daily until cough resolves  #30 x 5   Entered and Authorized by:   Marga Melnick MD   Signed by:   Marga Melnick MD on 12/16/2009   Method used:   Print then Give to Patient   RxID:   [id number] VENTOLIN HFA 108 (90 BASE) MCG/ACT AERS (ALBUTEROL SULFATE) 1-2 sprays every 4 hrs as needed  #1 x 2   Entered and Authorized by:   Marga Melnick MD   Signed by:   Marga Melnick MD on 12/16/2009   Method used:   Print then Give to  Patient   RxID:   [id number] DULERA 200-5 MCG/ACT AERO (MOMETASONE FURO-FORMOTEROL FUM) 1-2 puffs every 12 hrs as needed ; gargle & spit after use  #1 x 5   Entered and Authorized by:   Marga Melnick MD   Signed by:   Marga Melnick MD on 12/16/2009   Method used:   Print then Give to Patient   RxID:   [id number]    Orders Added: 1)  Est. Patient Level IV [23557] 2)  T-2 View CXR [71020TC]

## 2010-03-09 NOTE — Medication Information (Signed)
Summary: Possible Nonadherence with Amlodipine Besylate & Quinapril/[facility]  Possible Nonadherence with [facility]ine [facility] & Quinapril/[facility]   Imported By: Lanelle Bal 02/23/2009 13:51:20  _____________________________________________________________________  External Attachment:    Type:   Image     Comment:   External Document

## 2010-03-09 NOTE — Progress Notes (Signed)
Summary: REFILL REQUEST  Phone Note Refill Request Call back at [phone] Message from:  Pharmacy on July 31, 2009 8:22 AM  Refills Requested: Medication #1:  ADVICOR 1000-40 MG  TB24 1 by mouth at bedtime   Dosage confirmed as above?Dosage Confirmed   Supply Requested: 3 months   Last Refilled: 05/13/2009 [facility]  Next Appointment Scheduled: none Initial call taken by: Lavell Islam,  July 31, 2009 8:23 AM    Prescriptions: ADVICOR 1000-40 MG  TB24 (NIACIN-LOVASTATIN) 1 by mouth at bedtime  #90 x 2   Entered by:   Army Fossa CMA   Authorized by:   Loreen Freud DO   Signed by:   Army Fossa CMA on 07/31/2009   Method used:   Faxed to ...       [facility]       [address] [place], [place]  [address]       Ph: [phone]       Fax: [phone]   RxID:   [id number]

## 2010-03-09 NOTE — Medication Information (Signed)
Summary: Nonadherence with Advicor/[facility]  Nonadherence with Advicor/[facility]   Imported By: Lanelle Bal 04/15/2009 11:44:23  _____________________________________________________________________  External Attachment:    Type:   Image     Comment:   External Document

## 2010-03-09 NOTE — Progress Notes (Signed)
Summary: 9-30-URINE CULTURE  Phone Note Outgoing Call   Call placed by: Jeremy Johann CMA,  November 06, 2009 8:53 AM Details for Reason: technically negative < 100,000 col---finish cipro and recheck urine Summary of Call: left message to call office...............Marland KitchenFelecia Deloach CMA  November 06, 2009 8:53 AM   Follow-up for Phone Call        left detailed msg on patient voicemail...Marland KitchenMarland KitchenDoristine Devoid CMA  November 06, 2009 1:25 PM

## 2010-03-09 NOTE — Assessment & Plan Note (Signed)
Summary: sinus infection, 2500.0,268.9 vitamin d,a1c//fd   Vital Signs:  Patient profile:   65 year old male Weight:      229.6 pounds Temp:     97.7 degrees F BP sitting:   120 / 80  (left arm)  Vitals Entered By: Doristine Devoid (February 24, 2009 9:55 AM) CC: sinus infection    Primary Care Provider:  Laury Axon  CC:  sinus infection .  History of Present Illness: Bruce Romero is here today with a 2 week history of sinus pressure.  Denies fever.  Patient notes that he has used a Netti pot without improvement.    Allergies: 1)  ! Keflex  Review of Systems       Denies fever, notes + sinus pressure with yellow nasal discharge.  Physical Exam  General:  Elderly white male, awake, alert, NAD Head:  Normocephalic and atraumatic without obvious abnormalities. Mild bilateral maxillary sinus tenderness Ears:  External ear exam shows no significant lesions or deformities.  Otoscopic examination reveals clear canals, tympanic membranes are intact bilaterally without bulging, retraction, inflammation or discharge. Hearing is grossly normal bilaterally. Mouth:  Oral mucosa and oropharynx without lesions or exudates.  Teeth in good repair. Neck:  No deformities, masses, or tenderness noted. Lungs:  Normal respiratory effort, chest expands symmetrically. Lungs are clear to auscultation, no crackles or wheezes. Heart:  Normal rate and regular rhythm. S1 and S2 normal without gallop, murmur, click, rub or other extra sounds. Abdomen:  Bowel sounds positive,abdomen soft and non-tender without masses, organomegaly or hernias noted.   Impression & Recommendations:  Problem # 1:  SINUSITIS- ACUTE-NOS (ICD-461.9) Assessment New Will treat patient with z-pak due to history or anaphalaxis to keflex.  Patient  instructed to call if you develop fever over 101, increasing sinus pressure, pain with eye movement, increased facial tenderness of swelling, or if you develop visual changes.  His updated  medication list for this problem includes:    Flonase 50 Mcg/act Susp (Fluticasone propionate) .Marland Kitchen... 2 sprays each nostirl once as needed    Zithromax Z-pak 250 Mg Tabs (Azithromycin) .Marland Kitchen..Marland Kitchen Two tablets by mouth x 1 today, then one tablet by mouth daily x 4 more days  Complete Medication List: 1)  Actos 45 Mg Tabs (Pioglitazone hcl) .Marland Kitchen.. 1 by mouth once daily 2)  Allegra 180 Mg Tabs (Fexofenadine hcl) .Marland Kitchen.. 1 by mouth once daily 3)  Norvasc 10 Mg Tabs (Amlodipine besylate) .Marland Kitchen.. 1 by mouth once daily 4)  Accupril 20 Mg Tabs (Quinapril hcl) .Marland Kitchen.. 1 by mouth once daily 5)  Advicor 1000-40 Mg Tb24 (Niacin-lovastatin) .Marland Kitchen.. 1 by mouth at bedtime 6)  Nexium 40 Mg  .Marland KitchenMarland Kitchen. 1 by mouth once daily 7)  Spironolactone 25 Mg Tabs (Spironolactone) .Marland Kitchen.. 1 by mouth once daily 8)  Ambien 10 Mg Tabs (Zolpidem tartrate) .Marland Kitchen.. 1 by mouth at bedtime as needed 9)  Amaryl 4 Mg Tabs (Glimepiride) .Marland Kitchen.. 1 by mouth once daily 10)  Flonase 50 Mcg/act Susp (Fluticasone propionate) .... 2 sprays each nostirl once as needed 11)  Onglyza 5 Mg Tabs (Saxagliptin hcl) .... Take one tablet daily. 12)  Glucosamine-chondroitin Caps (Glucosamine-chondroit-vit c-mn) .... Take 2 tabs daily 13)  Fish Oil Oil (Fish oil) .... Take one tablet 14)  Cayenne Pepper 450 Mg Caps (Capsicum (cayenne)) .... 2 capsules daily 15)  Multivitamins Tabs (Multiple vitamin) .... 2 tabs daily 16)  Zithromax Z-pak 250 Mg Tabs (Azithromycin) .... Two tablets by mouth x 1 today, then one tablet by mouth daily  x 4 more days  Other Orders: Venipuncture ([address]) T-Vitamin D (25-Hydroxy) ([address]454-[address]11) TLB-A1C / Hgb A1C (Glycohemoglobin) (83036-A1C)  Patient Instructions: 1)   Call if you develop fever over 101, increasing sinus pressure, pain with eye movement, increased facial tenderness of swelling, or if you develop visual changes. Prescriptions: ZITHROMAX Z-PAK 250 MG TABS (AZITHROMYCIN) two tablets by mouth x 1 today, then one tablet by mouth daily x 4 more  days  #1 pack x 0   Entered and Authorized by:   Lemont Fillers FNP   Signed by:   Lemont Fillers FNP on 02/24/2009   Method used:   Electronically to        [id number] (retail)       [address]       [place], [place]  [address]       Ph: [phone] or [phone]       Fax: [phone]   RxID:   [id number]

## 2010-03-09 NOTE — Assessment & Plan Note (Signed)
Summary: FOR SINUS INFECTION//PH   Vital Signs:  Patient profile:   65 year old male Height:      67 inches Weight:      230 pounds BMI:     36.15 Temp:     97.7 degrees F oral Pulse rate:   62 / minute Pulse rhythm:   regular BP sitting:   118 / 76  (left arm) Cuff size:   large  Vitals Entered By: Army Fossa CMA (March 17, 2009 9:46 AM) CC: Pt c/o sinus infection not any better- was on a z-pack. pressure in his face, cough. , URI symptoms   History of Present Illness:       This is a 65 year old man who presents with URI symptoms.  The symptoms began 3 weeks ago.  Pt states he did get a little better after z pack but it never went away totally.  Pt not using any other meds.  The patient complains of nasal congestion, but denies clear nasal discharge, purulent nasal discharge, sore throat, dry cough, productive cough, earache, and sick contacts.  The patient denies fever, low-grade fever (<100.5 degrees), fever of 100.5-103 degrees, fever of 103.1-104 degrees, fever to >104 degrees, stiff neck, dyspnea, wheezing, rash, vomiting, diarrhea, use of an antipyretic, and response to antipyretic.  The patient also reports headache.  The patient denies itchy watery eyes, itchy throat, sneezing, seasonal symptoms, response to antihistamine, muscle aches, and severe fatigue.  The patient denies the following risk factors for Strep sinusitis: unilateral facial pain, unilateral nasal discharge, poor response to decongestant, double sickening, tooth pain, Strep exposure, tender adenopathy, and absence of cough.  + B/L frontal sinus pressure  Preventive Screening-Counseling & Management  Alcohol-Tobacco     Smoking Status: quit  Caffeine-Diet-Exercise     Diet Comments: high protein, low carb     Does Patient Exercise: yes  Current Medications (verified): 1)  Actos 30 Mg Tabs (Pioglitazone Hcl) .... Take 1 Tab Once Daily 2)  Allegra 180 Mg  Tabs (Fexofenadine Hcl) .Marland Kitchen.. 1 By Mouth Once  Daily 3)  Norvasc 10 Mg  Tabs (Amlodipine Besylate) .Marland Kitchen.. 1 By Mouth Once Daily 4)  Accupril 20 Mg  Tabs (Quinapril Hcl) .Marland Kitchen.. 1 By Mouth Once Daily 5)  Advicor 1000-40 Mg  Tb24 (Niacin-Lovastatin) .Marland Kitchen.. 1 By Mouth At Bedtime 6)  Nexium 40 Mg .Marland Kitchen.. 1 By Mouth Once Daily 7)  Spironolactone 25 Mg  Tabs (Spironolactone) .Marland Kitchen.. 1 By Mouth Once Daily 8)  Ambien 10 Mg  Tabs (Zolpidem Tartrate) .Marland Kitchen.. 1 By Mouth At Bedtime As Needed 9)  Amaryl 4 Mg  Tabs (Glimepiride) .Marland Kitchen.. 1 By Mouth Once Daily 10)  Veramyst 27.5 Mcg/spray Susp (Fluticasone Furoate) .... 2 Sprays Each Nostril Once Daily 11)  Onglyza 5 Mg Tabs (Saxagliptin Hcl) .... Take One Tablet Daily. 12)  Glucosamine-Chondroitin  Caps (Glucosamine-Chondroit-Vit C-Mn) .... Take 2 Tabs Daily 13)  Fish Oil  Oil (Fish Oil) .... Take One Tablet 14)  Cayenne Pepper 450 Mg Caps (Capsicum [facility])) .... 2 Capsules Daily 15)  Multivitamins  Tabs (Multiple Vitamin) .... 2 Tabs Daily 16)  Augmentin 875-125 Mg Tabs (Amoxicillin-Pot Clavulanate) .Marland Kitchen.. 1 By Mouth Two Times A Day  Allergies: 1)  ! Keflex  Past History:  Past medical, surgical, family and social histories (including risk factors) reviewed for relevance to current acute and chronic problems.  Past Medical History: Reviewed history from 02/06/2007 and no changes required. 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 Cholecystectomy  Family History: Reviewed history and no changes required.  Social History: Reviewed history from 07/04/2006 and no changes required. Former Smoker Does Patient Exercise:  yes  Review of Systems      See HPI  Physical Exam  General:  Well-developed,well-nourished,in no acute distress; alert,appropriate and cooperative throughout examination Ears:  + CERUMEN B/L  Nose:  no external erythema, L maxillary sinus tenderness, and R maxillary sinus tenderness.   Mouth:  Oral  mucosa and oropharynx without lesions or exudates.  Teeth in good repair. Neck:  cervical lymphadenopathy.   Lungs:  Normal respiratory effort, chest expands symmetrically. Lungs are clear to auscultation, no crackles or wheezes. Heart:  normal rate and Grade  2 /6 systolic ejection murmur.   Skin:  Intact without suspicious lesions or rashes Cervical Nodes:  R anterior LN enlarged and L anterior LN enlarged.   Psych:  Oriented X3 and normally interactive.     Impression & Recommendations:  Problem # 1:  SINUSITIS- ACUTE-NOS (ICD-461.9)  The following medications were removed from the medication list:    Zithromax Z-pak 250 Mg Tabs (Azithromycin) .Marland Kitchen..Marland Kitchen Two tablets by mouth x 1 today, then one tablet by mouth daily x 4 more days His updated medication list for this problem includes:    Veramyst 27.5 Mcg/spray Susp (Fluticasone furoate) .Marland Kitchen... 2 sprays each nostril once daily    Augmentin 875-125 Mg Tabs (Amoxicillin-pot clavulanate) .Marland Kitchen... 1 by mouth two times a day  Instructed on treatment. Call if symptoms persist or worsen.   Complete Medication List: 1)  Actos 30 Mg Tabs (Pioglitazone hcl) .... Take 1 tab once daily 2)  Allegra 180 Mg Tabs (Fexofenadine hcl) .Marland Kitchen.. 1 by mouth once daily 3)  Norvasc 10 Mg Tabs (Amlodipine besylate) .Marland Kitchen.. 1 by mouth once daily 4)  Accupril 20 Mg Tabs (Quinapril hcl) .Marland Kitchen.. 1 by mouth once daily 5)  Advicor 1000-40 Mg Tb24 (Niacin-lovastatin) .Marland Kitchen.. 1 by mouth at bedtime 6)  Nexium 40 Mg  .Marland KitchenMarland Kitchen. 1 by mouth once daily 7)  Spironolactone 25 Mg Tabs (Spironolactone) .Marland Kitchen.. 1 by mouth once daily 8)  Ambien 10 Mg Tabs (Zolpidem tartrate) .Marland Kitchen.. 1 by mouth at bedtime as needed 9)  Amaryl 4 Mg Tabs (Glimepiride) .Marland Kitchen.. 1 by mouth once daily 10)  Veramyst 27.5 Mcg/spray Susp (Fluticasone furoate) .... 2 sprays each nostril once daily 11)  Onglyza 5 Mg Tabs (Saxagliptin hcl) .... Take one tablet daily. 12)  Glucosamine-chondroitin Caps (Glucosamine-chondroit-vit c-mn) ....  Take 2 tabs daily 13)  Fish Oil Oil (Fish oil) .... Take one tablet 14)  Cayenne Pepper 450 Mg Caps (Capsicum (cayenne)) .... 2 capsules daily 15)  Multivitamins Tabs (Multiple vitamin) .... 2 tabs daily 16)  Augmentin 875-125 Mg Tabs (Amoxicillin-pot clavulanate) .Marland Kitchen.. 1 by mouth two times a day Prescriptions: VERAMYST 27.5 MCG/SPRAY SUSP (FLUTICASONE FUROATE) 2 SPRAYS EACH NOSTRIL once daily  #1 x 0   Entered and Authorized by:   Loreen Freud DO   Signed by:   Loreen Freud DO on 03/17/2009   Method used:   Historical   RxID:   [id number] AUGMENTIN 875-125 MG TABS (AMOXICILLIN-POT CLAVULANATE) 1 by mouth two times a day  #20 x 0   Entered and Authorized by:   Loreen Freud DO   Signed by:   Loreen Freud DO on 03/17/2009   Method used:   Electronically to        [id number] (retail)       [address]       [place], [place]  [address]       Ph: [phone] or [phone]       Fax: [phone]   RxID:   [id number]

## 2010-03-09 NOTE — Medication Information (Signed)
Summary: Nonadherence with Advicor/[facility]  Nonadherence with Advicor/[facility]   Imported By: Lanelle Bal 12/09/2009 12:54:29  _____________________________________________________________________  External Attachment:    Type:   Image     Comment:   External Document

## 2010-03-09 NOTE — Progress Notes (Signed)
Summary: still no better  Phone Note Call from Patient Call back at Work Phone [phone]   Caller: Patient Summary of Call: Pt still c/o of chest congestion,  and cough with yellowish mucous.  Pt seen on 11-30-09 for SINUSITIS.[facility] [address] Pls advise..............Marland KitchenFelecia Deloach CMA  December 14, 2009 9:48 AM    Follow-up for Phone Call        ceftin 500 1 by mouth two times a day for 10 days  Follow-up by: Loreen Freud DO,  December 14, 2009 10:13 AM  Additional Follow-up for Phone Call Additional follow up Details #1::        left pt detail message rx sent to pharmacy and OVINB...............Marland KitchenFelecia Deloach CMA  December 14, 2009 12:40 PM     New/Updated Medications: CEFTIN 500 MG TABS (CEFUROXIME AXETIL) Take 1 by mouth two times a day for 10 days Prescriptions: CEFTIN 500 MG TABS (CEFUROXIME AXETIL) Take 1 by mouth two times a day for 10 days  #20 x 0   Entered by:   Jeremy Johann CMA   Authorized by:   Loreen Freud DO   Signed by:   Jeremy Johann CMA on 12/14/2009   Method used:   Faxed to ...       [facility]  [address] [id number]* (retail)       [address]       [place], [place]  [address]       Ph: [phone] or [phone]       Fax: [phone]   RxID:   [id number]

## 2010-03-09 NOTE — Assessment & Plan Note (Signed)
Summary: congested/cough/cbs   Vital Signs:  Patient profile:   65 year old male Weight:      235.2 pounds O2 Sat:      97 % on Room air Temp:     97.9 degrees F oral Pulse rate:   69 / minute Pulse rhythm:   regular BP sitting:   118 / 80  (right arm) Cuff size:   large  Vitals Entered By: Almeta Monas CMA Duncan Dull) (November 30, 2009 2:49 PM)  O2 Flow:  Room air CC: c/o cough, congestion, sinus pressure, wants to discuss actos, URI symptoms   History of Present Illness:       This is a 65 year old man who presents with URI symptoms.  The symptoms began 6 days ago.  Pt is using neti pot, mucincex and flonase with no relief.  The patient complains of nasal congestion, purulent nasal discharge, dry cough, earache, and sick contacts.  The patient denies fever, low-grade fever (<100.5 degrees), fever of 100.5-103 degrees, fever of 103.1-104 degrees, fever to >104 degrees, stiff neck, dyspnea, wheezing, rash, vomiting, diarrhea, use of an antipyretic, and response to antipyretic.  The patient also reports headache.  The patient denies itchy watery eyes, itchy throat, sneezing, seasonal symptoms, response to antihistamine, muscle aches, and severe fatigue.  Risk factors for Strep sinusitis include unilateral facial pain.  The patient denies the following risk factors for Strep sinusitis: unilateral nasal discharge, poor response to decongestant, double sickening, tooth pain, Strep exposure, tender adenopathy, and absence of cough.    Current Medications (verified): 1)  Actos 30 Mg Tabs (Pioglitazone Hcl) .... Take 1 Tab Once Daily 2)  Allegra 180 Mg  Tabs (Fexofenadine Hcl) .Marland Kitchen.. 1 By Mouth Once Daily 3)  Norvasc 10 Mg  Tabs (Amlodipine Besylate) .Marland Kitchen.. 1 By Mouth Once Daily 4)  Accupril 20 Mg  Tabs (Quinapril Hcl) .Marland Kitchen.. 1 By Mouth Once Daily 5)  Advicor 1000-40 Mg  Tb24 (Niacin-Lovastatin) .Marland Kitchen.. 1 By Mouth At Bedtime 6)  Nexium 40 Mg .Marland Kitchen.. 1 By Mouth Once Daily 7)  Spironolactone 25 Mg  Tabs  (Spironolactone) .Marland Kitchen.. 1 By Mouth Once Daily 8)  Ambien 10 Mg  Tabs (Zolpidem Tartrate) .Marland Kitchen.. 1 By Mouth At Bedtime As Needed 9)  Amaryl 4 Mg  Tabs (Glimepiride) .Marland Kitchen.. 1 By Mouth Once Daily 10)  Veramyst 27.5 Mcg/spray Susp (Fluticasone Furoate) .... 2 Sprays Each Nostril Once Daily 11)  Onglyza 5 Mg Tabs (Saxagliptin Hcl) .... Take One Tablet Daily. 12)  Glucosamine-Chondroitin  Caps (Glucosamine-Chondroit-Vit C-Mn) .... Take 2 Tabs Daily 13)  Fish Oil  Oil (Fish Oil) .... Take One Tablet 14)  Cayenne Pepper 450 Mg Caps (Capsicum [facility])) .... 2 Capsules Daily 15)  Multivitamins  Tabs (Multiple Vitamin) .... 2 Tabs Daily 16)  Mobic 15 Mg Tabs (Meloxicam) .... 1/2 -1 By Mouth Once Daily 17)  Mobic 15 Mg Tabs (Meloxicam) .... 1/2 -1 By Mouth Once Daily As Needed Pain  Allergies (verified): 1)  ! 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  Otoscopic examination reveals clear canals, tympanic membranes are intact bilaterally without bulging, retraction, inflammation or discharge. Hearing is grossly normal bilaterally. Nose:  L frontal sinus tenderness, L maxillary sinus tenderness, R frontal sinus tenderness, and R maxillary sinus tenderness.  L frontal sinus tenderness, L maxillary sinus tenderness, R frontal sinus tenderness, and R maxillary sinus tenderness.   Mouth:  pharyngeal erythema and postnasal drip.  pharyngeal  erythema and postnasal drip.   Neck:  No deformities, masses, or tenderness noted. Lungs:  Normal respiratory effort, chest expands symmetrically. Lungs are clear to auscultation, no crackles or wheezes. Heart:  Normal rate and regular rhythm. S1 and S2 normal without gallop, murmur, click, rub or other extra sounds. Psych:  Cognition and judgment appear intact. Alert and cooperative with normal attention span and concentration. No apparent delusions, illusions, hallucinations   Impression & Recommendations:  Problem # 1:  SINUSITIS- ACUTE-NOS (ICD-461.9)  His updated medication list for this problem includes:    Veramyst 27.5 Mcg/spray Susp (Fluticasone furoate) .Marland Kitchen... 2 sprays each nostril once daily    Augmentin 875-125 Mg Tabs (Amoxicillin-pot clavulanate) .Marland Kitchen... 1 by mouth two times a day    Cheratussin Ac 100-10 Mg/92ml Syrp (Guaifenesin-codeine) .Marland Kitchen... 1-2 tsp by mouth at bedtime as needed    Nasonex 50 Mcg/act Susp (Mometasone furoate) .Marland Kitchen... 2 spray s each nostril once daily  Instructed on treatment. Call if symptoms persist or worsen.   Problem # 2:  DIABETES MELLITUS (ICD-250.00)  The following medications were removed from the medication list:    Actos 30 Mg Tabs (Pioglitazone hcl) .Marland Kitchen... Take 1 tab once daily His updated medication list for this problem includes:    Accupril 20 Mg Tabs (Quinapril hcl) .Marland Kitchen... 1 by mouth once daily    Amaryl 4 Mg Tabs (Glimepiride) .Marland Kitchen... 1 by mouth once daily    Onglyza 5 Mg Tabs (Saxagliptin hcl) .Marland Kitchen... Take one tablet daily.  Orders: Venipuncture (16109) TLB-A1C / Hgb A1C (Glycohemoglobin) (83036-A1C) Specimen Handling (60454)  Labs Reviewed: Creat: 1.2 (11/03/2009)    Reviewed HgBA1c results: 5.8 (05/27/2009)  5.7 (02/24/2009)  Complete Medication List: 1)  Allegra 180 Mg Tabs (Fexofenadine hcl) .Marland Kitchen.. 1 by mouth once daily 2)  Norvasc 10 Mg Tabs (Amlodipine besylate) .Marland Kitchen.. 1 by mouth once daily 3)  Accupril 20 Mg Tabs (Quinapril hcl) .Marland Kitchen..  1 by mouth once daily 4)  Advicor 1000-40 Mg Tb24 (Niacin-lovastatin) .Marland Kitchen.. 1 by mouth at bedtime 5)  Nexium 40 Mg  .Marland KitchenMarland Kitchen. 1 by mouth once daily 6)  Spironolactone 25 Mg Tabs (Spironolactone) .Marland Kitchen.. 1 by mouth once daily 7)  Ambien 10 Mg Tabs (Zolpidem tartrate) .Marland Kitchen.. 1 by mouth at bedtime as needed 8)  Amaryl 4 Mg Tabs (Glimepiride) .Marland Kitchen.. 1 by mouth once daily 9)  Veramyst 27.5 Mcg/spray Susp (Fluticasone furoate) .... 2 sprays each nostril once daily 10)  Onglyza 5 Mg Tabs (Saxagliptin hcl) .... Take one tablet daily. 11)  Glucosamine-chondroitin Caps (Glucosamine-chondroit-vit c-mn) .... Take 2 tabs daily 12)  Fish Oil Oil (Fish oil) .... Take one tablet 13)  Cayenne Pepper 450 Mg Caps (Capsicum (cayenne)) .... 2 capsules daily 14)  Multivitamins Tabs (Multiple vitamin) .... 2 tabs daily 15)  Mobic 15 Mg Tabs (Meloxicam) .... 1/2 -1 by mouth once daily 16)  Mobic 15 Mg Tabs (Meloxicam) .... 1/2 -1 by mouth once daily as needed pain 17)  Augmentin 875-125 Mg Tabs (Amoxicillin-pot clavulanate) .Marland Kitchen.. 1 by mouth two times a day 18)  Cheratussin Ac 100-10 Mg/50ml  Syrp (Guaifenesin-codeine) .Marland Kitchen.. 1-2 tsp by mouth at bedtime as needed 19)  Nasonex 50 Mcg/act Susp (Mometasone furoate) .... 2 spray s each nostril once daily Prescriptions: CHERATUSSIN AC 100-10 MG/5ML SYRP (GUAIFENESIN-CODEINE) 1-2 tsp by mouth at bedtime as needed  #6 oz x 0   Entered and Authorized by:   Loreen Freud DO   Signed by:   Loreen Freud DO on 11/30/2009   Method used:   Print then Give to Patient   RxID:   [id number] AUGMENTIN 875-125 MG TABS (AMOXICILLIN-POT CLAVULANATE) 1 by mouth two times a day  #20 x 0   Entered and Authorized by:   Loreen Freud DO   Signed by:   Loreen Freud DO on 11/30/2009   Method used:   Electronically to        [id number] (retail)       [address]       [place], [place]  [address]       Ph: [phone] or [phone]       Fax: [phone]    RxID:   [id number]    Orders Added: 1)  Venipuncture [36415] 2)  TLB-A1C / Hgb A1C (Glycohemoglobin) [83036-A1C] 3)  Specimen Handling [99000] 4)  Est. Patient Level III [95638]  Appended Document: congested/cough/cbs  Laboratory Results   Urine Tests   Date/Time Reported: November 30, 2009 3:07 PM   Routine Urinalysis   Color: yellow Appearance: Clear Glucose: negative   (Normal Range: Negative) Bilirubin: moderate   (Normal Range: Negative) Ketone: negative   (Normal Range: Negative) Spec. Gravity: 1.020   (Normal Range: 1.003-1.035) Blood: small   (Normal Range: Negative) pH: 5.0   (Normal Range: 5.0-8.0) Protein: trace   (Normal Range: Negative) Urobilinogen: negative   (Normal Range: 0-1) Nitrite: negative   (Normal Range: Negative) Leukocyte Esterace: negative   (Normal Range: Negative)    Comments: pt. states needs recheck from previous blood in urine... no symptoms....finished Cipro cx sent     Appended Document: Orders Update blood still in urine---  refer to urology  pt aware.... Almeta Monas CMA Duncan Dull)  November 30, 2009 4:58 PM  Clinical Lists Changes  Orders: Added new Referral order of Urology Referral (Urology) - Signed

## 2010-03-09 NOTE — Medication Information (Signed)
Summary: Fax Regarding Nexium & GERD/[facility]  Fax Regarding Nexium & GERD/[facility]   Imported By: Lanelle Bal 11/05/2009 11:20:52  _____________________________________________________________________  External Attachment:    Type:   Image     Comment:   External Document

## 2010-03-09 NOTE — Assessment & Plan Note (Signed)
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  Other wise it does not hurt---x 1 month Pt also c/o urinary frequency and pressure.  No fever or back pain.    Current Medications (verified): 1)  Actos 30 Mg Tabs (Pioglitazone Hcl) .... Take 1 Tab Once Daily 2)  Allegra 180 Mg  Tabs (Fexofenadine Hcl) .Marland Kitchen.. 1 By Mouth Once Daily 3)  Norvasc 10 Mg  Tabs (Amlodipine Besylate) .Marland Kitchen.. 1 By Mouth Once Daily 4)  Accupril 20 Mg  Tabs (Quinapril Hcl) .Marland Kitchen.. 1 By Mouth Once Daily 5)  Advicor 1000-40 Mg  Tb24 (Niacin-Lovastatin) .Marland Kitchen.. 1 By Mouth At Bedtime 6)  Nexium 40 Mg .Marland Kitchen.. 1 By Mouth Once Daily 7)  Spironolactone 25 Mg  Tabs (Spironolactone) .Marland Kitchen.. 1 By Mouth Once Daily 8)  Ambien 10 Mg  Tabs (Zolpidem Tartrate) .Marland Kitchen.. 1 By Mouth At Bedtime As Needed 9)  Amaryl 4 Mg  Tabs (Glimepiride) .Marland Kitchen.. 1 By Mouth Once Daily 10)  Veramyst 27.5 Mcg/spray Susp (Fluticasone Furoate) .... 2 Sprays Each Nostril Once Daily 11)  Onglyza 5 Mg Tabs (Saxagliptin Hcl) .... Take One Tablet Daily. 12)  Glucosamine-Chondroitin  Caps (Glucosamine-Chondroit-Vit C-Mn) .... Take 2 Tabs Daily 13)  Fish Oil  Oil (Fish Oil) .... Take One Tablet 14)  Cayenne Pepper 450 Mg Caps (Capsicum [facility])) .... 2 Capsules Daily 15)  Multivitamins  Tabs (Multiple Vitamin) .... 2 Tabs Daily 16)  Cipro 500 Mg Tabs (Ciprofloxacin Hcl) .Marland Kitchen.. 1 By Mouth Two Times A Day 17)  Mobic 15 Mg Tabs (Meloxicam) .... 1/2 -1 By Mouth Once Daily 18)  Mobic 15 Mg Tabs (Meloxicam) .... 1/2 -1 By Mouth Once Daily As Needed Pain  Allergies (verified): 1)  ! Keflex  Past History:  Past medical, surgical,  family and social histories (including risk factors) reviewed for relevance to current acute and chronic problems.  Past Medical History: Reviewed history from 02/06/2007 and no changes required. 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 Cholecystectomy  Family History: Reviewed history and no changes required.  Social History: Reviewed history from 07/04/2006 and no changes required. Former Smoker  Review of Systems      See HPI  Physical Exam  General:  Well-developed,well-nourished,in no acute distress; alert,appropriate and cooperative throughout examination Abdomen:  + suprapubic tenderness soft, no guarding, and no rebound tenderness.   Rectal:  No external abnormalities noted. Normal sphincter tone. No rectal masses or tenderness. Genitalia:  Testes bilaterally descended without nodularity, tenderness or masses. No scrotal masses or lesions. No penis lesions or urethral discharge. Prostate:  no nodules and 1+ enlarged.   no pain Psych:  Cognition and judgment appear intact. Alert and cooperative with normal attention span and concentration. No apparent delusions, illusions, hallucinations   Impression & Recommendations:  Problem # 1:  SHOULDER PAIN, RIGHT (ICD-719.41)  His updated medication list for this problem includes:    Mobic 15 Mg Tabs (Meloxicam) .Marland Kitchen... 1/2 -1 by mouth once daily  Mobic 15 Mg Tabs (Meloxicam) .Marland Kitchen... 1/2 -1 by mouth once daily as needed pain  Discussed shoulder exercises, use of moist heat or ice, and medication.   Orders: UA Dipstick w/o Micro (manual) (04540)  Problem # 2:  FREQUENCY, URINARY (ICD-788.41)  Orders: T-Culture, Urine (98119-14782) Venipuncture (95621) TLB-BMP (Basic Metabolic Panel-BMET) (80048-METABOL) TLB-CBC Platelet - w/Differential (85025-CBCD) TLB-PSA (Prostate Specific Antigen) (84153-PSA) UA Dipstick w/o Micro (manual)  (81002)  Discussed use of medication.   Complete Medication List: 1)  Actos 30 Mg Tabs (Pioglitazone hcl) .... Take 1 tab once daily 2)  Allegra 180 Mg Tabs (Fexofenadine hcl) .Marland Kitchen.. 1 by mouth once daily 3)  Norvasc 10 Mg Tabs (Amlodipine besylate) .Marland Kitchen.. 1 by mouth once daily 4)  Accupril 20 Mg Tabs (Quinapril hcl) .Marland Kitchen.. 1 by mouth once daily 5)  Advicor 1000-40 Mg Tb24 (Niacin-lovastatin) .Marland Kitchen.. 1 by mouth at bedtime 6)  Nexium 40 Mg  .Marland KitchenMarland Kitchen. 1 by mouth once daily 7)  Spironolactone 25 Mg Tabs (Spironolactone) .Marland Kitchen.. 1 by mouth once daily 8)  Ambien 10 Mg Tabs (Zolpidem tartrate) .Marland Kitchen.. 1 by mouth at bedtime as needed 9)  Amaryl 4 Mg Tabs (Glimepiride) .Marland Kitchen.. 1 by mouth once daily 10)  Veramyst 27.5 Mcg/spray Susp (Fluticasone furoate) .... 2 sprays each nostril once daily 11)  Onglyza 5 Mg Tabs (Saxagliptin hcl) .... Take one tablet daily. 12)  Glucosamine-chondroitin Caps (Glucosamine-chondroit-vit c-mn) .... Take 2 tabs daily 13)  Fish Oil Oil (Fish oil) .... Take one tablet 14)  Cayenne Pepper 450 Mg Caps (Capsicum (cayenne)) .... 2 capsules daily 15)  Multivitamins Tabs (Multiple vitamin) .... 2 tabs daily 16)  Cipro 500 Mg Tabs (Ciprofloxacin hcl) .Marland Kitchen.. 1 by mouth two times a day 17)  Mobic 15 Mg Tabs (Meloxicam) .... 1/2 -1 by mouth once daily 18)  Mobic 15 Mg Tabs (Meloxicam) .... 1/2 -1 by mouth once daily as needed pain Prescriptions: MOBIC 15 MG TABS (MELOXICAM) 1/2 -1 by mouth once daily as needed pain  #30 x 1   Entered and Authorized by:   Loreen Freud DO   Signed by:   Loreen Freud DO on 11/03/2009   Method used:   Electronically to        [id number] (retail)       [address]       [place], [place]  [address]       Ph: [phone] or [phone]       Fax: [phone]   RxID:   [id number] MOBIC 15 MG TABS (MELOXICAM) 1/2 -1 by mouth once daily  #30 x 2   Entered and Authorized by:   Loreen Freud DO   Signed by:   Loreen Freud DO  on 11/03/2009   Method used:   Electronically to        [id number] (retail)       [address]       [place], [place]  [address]       Ph: [phone] or [phone]       Fax: [phone]   RxID:   [id number] CIPRO 500 MG TABS (CIPROFLOXACIN HCL) 1 by mouth two times a day  #20 x 0   Entered and Authorized by:   Loreen Freud DO   Signed by:   Loreen Freud DO on 11/03/2009   Method used:   Electronically to  [facility]  [address] [id number] (retail)       [address]       [place], [place]  [address]       Ph: [phone] or [phone]       Fax: [phone]   RxID:   [id number]   Laboratory Results   Urine Tests    Routine Urinalysis   Color: yellow Appearance: Clear Glucose: negative   (Normal Range: Negative) Bilirubin: negative   (Normal Range: Negative) Ketone: negative   (Normal Range: Negative) Spec. Gravity: 1.010   (Normal Range: 1.003-1.035) Blood: small   (Normal Range: Negative) pH: 6.0   (Normal Range: 5.0-8.0) Protein: trace   (Normal Range: Negative) Urobilinogen: 0.2   (Normal Range: 0-1) Nitrite: negative   (Normal Range: Negative) Leukocyte Esterace: negative   (Normal Range: Negative)

## 2010-03-09 NOTE — Progress Notes (Signed)
Summary: stopping med  Phone Note Call from Patient Call back at [facility] Phone [phone] Call back at Work Phone [phone]   Caller: Patient Summary of Call: pt left VM that he just got a1c checked and it was 5.7%. pt states that he is on a high protein/fiber diet and would like to know if dr Laury Axon would consider D/C actos to help with his weight loss. dr Laury Axon pls advise on med....................Marland KitchenFelecia Deloach CMA  February 27, 2009 11:31 AM   Follow-up for Phone Call        Let do that slowly---- decrease to 30 mg once daily ---if still ok in 3 months we will decrease to 15 or stop depending on value Follow-up by: Loreen Freud DO,  February 27, 2009 12:17 PM  Additional Follow-up for Phone Call Additional follow up Details #1::        pt aware.new rx sent to pharamacy..............Marland KitchenFelecia Deloach CMA  February 27, 2009 1:45 PM     New/Updated Medications: ACTOS 30 MG TABS (PIOGLITAZONE HCL) take 1 tab once daily Prescriptions: ACTOS 30 MG TABS (PIOGLITAZONE HCL) take 1 tab once daily  #90 x 0   Entered by:   Jeremy Johann CMA   Authorized by:   Loreen Freud DO   Signed by:   Jeremy Johann CMA on 02/27/2009   Method used:   Faxed to ...       [facility]       [address] [place], [place]  [address]       Ph: [phone]       Fax: [phone]   RxID:   [id number]

## 2010-03-09 NOTE — Progress Notes (Signed)
Summary: still no better  Phone Note Call from Patient Call back at Work Phone [phone]   Caller: Patient Summary of Call: Pt  seen on  12-16-09 for acute bronchitis. PT now c/o yellow mucous, post nasal drainage, head congestion,and stuffy nose. Pt denies any fever, cough, or sore throat. Pt would like to have another med rx. Pt uses [facility] [address]. Pls advise........Marland KitchenFelecia Deloach CMA  January 04, 2010 8:35 AM   Follow-up for Phone Call        yvonne , he is post Ceftin & Zpack. Due to age & co-morbidities , he needs F/U  Follow-up by: Marga Melnick MD,  January 04, 2010 10:03 AM  Additional Follow-up for Phone Call Additional follow up Details #1::        avelox 400 mg 1 by mouth once daily for 10 days----ov if no better after that Additional Follow-up by: Loreen Freud DO,  January 04, 2010 11:00 AM    Additional Follow-up for Phone Call Additional follow up Details #2::    pt aware Rx sent to Pharmacy and he also requested to have his 90 day supply sent to [facility] because they don't have it. Adv a 30 day supply has been sent to local pharmacy as well.Pt voiced understanding, Advised if no better he will need an OV. Follow-up by: Almeta Monas CMA Duncan Dull),  January 04, 2010 11:09 AM  New/Updated Medications: AVELOX 400 MG TABS (MOXIFLOXACIN HCL) 1 by mouth once daily x10 days Prescriptions: SPIRONOLACTONE 25 MG  TABS (SPIRONOLACTONE) 1 by mouth once daily  #90 x 1   Entered by:   Almeta Monas CMA (AAMA)   Authorized by:   Loreen Freud DO   Signed by:   Almeta Monas CMA (AAMA) on 01/04/2010   Method used:   Faxed to ...       [facility])       [address] [place], [place]  [address]       Ph: [phone]       Fax: [phone]   RxID:   [id number] AVELOX 400 MG TABS (MOXIFLOXACIN HCL) 1 by mouth once daily x10 days  #10 x 0   Entered by:   Almeta Monas CMA (AAMA)   Authorized by:   Loreen Freud DO   Signed by:   Almeta Monas CMA (AAMA) on 01/04/2010   Method used:   Electronically to        [id number] (retail)       [address]       [place], [place]  [address]       Ph: [phone] or [phone]       Fax: [phone]   RxID:   [id number]

## 2010-03-11 NOTE — Assessment & Plan Note (Signed)
Summary: congestion//lch   Vital Signs:  Patient profile:   65 year old male Weight:      238.4 pounds O2 Sat:      96 % on Room air Temp:     98.1 degrees F oral Pulse rate:   93 / minute Pulse rhythm:   regular BP sitting:   144 / 82  (left arm) Cuff size:   large  Vitals Entered By: Almeta Monas CMA Duncan Dull) (February 10, 2010 9:47 AM)  O2 Flow:  Room air CC: still sick--c/o sinus drainage, ears feel clogged and sinus pressure, URI symptoms   History of Present Illness:       This is a 65 year old man who presents with URI symptoms.  Pt states he never completely got better from last visit.  The patient complains of nasal congestion, purulent nasal discharge, productive cough, earache, and sick contacts.  Associated symptoms include wheezing.  The patient denies fever, low-grade fever (<100.5 degrees), fever of 100.5-103 degrees, fever of 103.1-104 degrees, fever to >104 degrees, stiff neck, dyspnea, rash, vomiting, diarrhea, use of an antipyretic, and response to antipyretic.  The patient denies itchy watery eyes, itchy throat, sneezing, seasonal symptoms, response to antihistamine, headache, muscle aches, and severe fatigue.  The patient denies the following risk factors for Strep sinusitis: unilateral facial pain, unilateral nasal discharge, poor response to decongestant, double sickening, tooth pain, Strep exposure, tender adenopathy, and absence of cough.    Current Medications (verified): 1)  Allegra 180 Mg  Tabs (Fexofenadine Hcl) .Marland Kitchen.. 1 By Mouth Once Daily 2)  Norvasc 10 Mg  Tabs (Amlodipine Besylate) .Marland Kitchen.. 1 By Mouth Once Daily 3)  Accupril 20 Mg  Tabs (Quinapril Hcl) .Marland Kitchen.. 1 By Mouth Once Daily 4)  Advicor 1000-40 Mg  Tb24 (Niacin-Lovastatin) .Marland Kitchen.. 1 By Mouth At Bedtime 5)  Nexium 40 Mg .Marland Kitchen.. 1 By Mouth Once Daily 6)  Spironolactone 25 Mg  Tabs (Spironolactone) .Marland Kitchen.. 1 By Mouth Once Daily 7)  Ambien 10 Mg  Tabs (Zolpidem Tartrate) .Marland Kitchen.. 1 By Mouth At Bedtime As Needed 8)  Amaryl  4 Mg  Tabs (Glimepiride) .... 1/2  By Mouth Once Daily (A1c) 9)  Onglyza 5 Mg Tabs (Saxagliptin Hcl) .... Take One Tablet Daily. 10)  Vitamin D 2000 Unit Tabs (Cholecalciferol) .Marland Kitchen.. 1 By Mouth Once Daily 11)  Flonase 50 Mcg/act Susp (Fluticasone Propionate) .... 2 Sprays Each Nostil Once Daily 12)  Dulera 200-5 Mcg/act Aero (Mometasone Furo-Formoterol Fum) .Marland Kitchen.. 1-2 Puffs Every 12 Hrs As Needed ; Gargle & Spit After Use 13)  Ventolin Hfa 108 (90 Base) Mcg/act Aers (Albuterol Sulfate) .Marland Kitchen.. 1-2 Sprays Every 4 Hrs As Needed 14)  Singulair 10 Mg Tabs (Montelukast Sodium) .Marland Kitchen.. 1 Once Daily Until Cough Resolves 15)  Prednisone 10 Mg Tabs (Prednisone) .... 3 By Mouth Once Daily For 3 Days Then 2 By Mouth Once Daily For 3 Days Then 1 By Mouth Once Daily For 3 Days 16)  Cheratussin Ac 100-10 Mg/92ml Syrp (Guaifenesin-Codeine) .Marland Kitchen.. 1-2 Tsp By Mouth At Bedtime As Needed Cough  Allergies (verified): 1)  ! 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 No apparent alopecia or balding. Ears:  External ear exam shows no significant lesions or deformities.  Otoscopic examination reveals clear canals, tympanic membranes are intact bilaterally without bulging, retraction, inflammation or discharge. Hearing is grossly normal bilaterally. Nose:  R sinus pressure  Lungs:  normal respiratory effort, no intercostal retractions, and no wheezes.     Heart:  normal rate and no murmur.   Psych:  Oriented X3.     Impression & Recommendations:  Problem # 1:  BRONCHITIS- ACUTE (ICD-466.0) Assessment Improved  pt did not want to see pulm yet-- if no better after prednisone we will refer to pulm--pt agrees  The following medications were removed from the medication list:    Cheratussin Ac 100-10 Mg/45ml Syrp (Guaifenesin-codeine) .Marland Kitchen... 1-2 tsp by mouth at bedtime as needed    Ceftin 500 Mg Tabs (Cefuroxime axetil) .Marland Kitchen... Take 1 by mouth two times a day for 10 days    Azithromycin 250 Mg Tabs (Azithromycin)    Avelox 400 Mg Tabs (Moxifloxacin hcl) .Marland Kitchen... 1 by mouth once daily x10 days His updated medication list for this problem includes:    Dulera 200-5 Mcg/act Aero (Mometasone furo-formoterol fum) .Marland Kitchen... 1-2 puffs every 12 hrs as needed ; gargle & spit after use    Ventolin Hfa 108 (90 Base) Mcg/act Aers (Albuterol sulfate) .Marland Kitchen... 1-2 sprays every 4 hrs as needed    Singulair 10 Mg Tabs (Montelukast sodium) .Marland Kitchen... 1 once daily until cough resolves    Cheratussin Ac 100-10 Mg/72ml Syrp (Guaifenesin-codeine) .Marland Kitchen... 1-2 tsp by mouth at bedtime as needed cough  Take antibiotics and other medications as directed. Encouraged to push clear liquids, get enough rest, and take acetaminophen as needed. To be seen in 5-7 days if no improvement, sooner if worse.  Problem # 2:  SHOULDER PAIN, RIGHT (ICD-719.41)  pt prefers not to see ortho yet  Discussed shoulder exercises, use of moist heat or ice, and medication.   Complete Medication List: 1)  Allegra 180 Mg Tabs (Fexofenadine hcl) .Marland Kitchen.. 1 by mouth once daily 2)  Norvasc 10 Mg Tabs (Amlodipine besylate) .Marland Kitchen.. 1 by mouth once daily 3)  Accupril 20 Mg Tabs (Quinapril hcl) .Marland Kitchen.. 1 by mouth once daily 4)  Advicor 1000-40 Mg Tb24 (Niacin-lovastatin) .Marland Kitchen.. 1 by mouth at bedtime 5)  Nexium 40 Mg  .Marland KitchenMarland Kitchen. 1 by mouth once daily 6)  Spironolactone 25 Mg Tabs (Spironolactone) .Marland Kitchen.. 1 by mouth once daily 7)  Ambien 10  Mg Tabs (Zolpidem tartrate) .Marland Kitchen.. 1 by mouth at bedtime as needed 8)  Amaryl 4 Mg Tabs (Glimepiride) .... 1/2  by mouth once daily (a1c) 9)  Onglyza 5 Mg Tabs (Saxagliptin hcl) .... Take one tablet daily. 10)  Vitamin D 2000 Unit Tabs (Cholecalciferol) .Marland Kitchen.. 1 by mouth once daily 11)  Flonase 50 Mcg/act Susp (Fluticasone propionate) .... 2 sprays each nostil once daily 12)  Dulera 200-5 Mcg/act Aero (Mometasone furo-formoterol fum) .Marland Kitchen.. 1-2 puffs every 12 hrs as needed ; gargle & spit after use 13)  Ventolin Hfa 108 (90 Base) Mcg/act Aers (Albuterol sulfate) .Marland Kitchen.. 1-2 sprays every 4 hrs as needed 14)  Singulair 10 Mg Tabs (Montelukast sodium) .Marland Kitchen.. 1 once daily until cough resolves 15)  Prednisone 10 Mg Tabs (Prednisone) .... 3 by mouth once daily for 3 days then 2 by mouth once daily for 3 days then 1 by mouth once daily for 3 days 16)  Cheratussin Ac 100-10 Mg/1ml Syrp (Guaifenesin-codeine) .Marland Kitchen.. 1-2 tsp by mouth at bedtime as needed cough  Patient Instructions: 1)  use the Dulera 2 x a day and the ventolin only as needed Prescriptions: DULERA 200-5 MCG/ACT AERO (MOMETASONE FURO-FORMOTEROL FUM) 1-2 puffs every 12 hrs as needed ; gargle & spit after use  #1 x 5   Entered and Authorized by:   Loreen Freud DO   Signed by:   Loreen Freud DO on 02/10/2010   Method used:   Print then Give to Patient   RxID:   [id number] CHERATUSSIN AC 100-10 MG/5ML SYRP (GUAIFENESIN-CODEINE) 1-2 tsp by mouth at bedtime as needed cough  #6 oz x 0   Entered and Authorized by:   Loreen Freud DO   Signed by:   Loreen Freud DO on 02/10/2010   Method used:   Print then Give to Patient   RxID:   [id number] PREDNISONE 10 MG TABS (PREDNISONE) 3 by mouth once daily for 3 days then 2 by mouth once daily for 3 days then 1 by mouth once daily for 3 days  #18 x 0   Entered and Authorized by:   Loreen Freud DO   Signed by:   Loreen Freud DO on 02/10/2010   Method used:   Electronically to        [facility] [id number] (retail)       [address]       [place], [place]  [address]       Ph: [phone] or [phone]       Fax: [phone]   RxID:   [id number] FLONASE 50 MCG/ACT SUSP (FLUTICASONE PROPIONATE) 2 sprays each nostil once daily  #1 x 1   Entered and Authorized by:   Loreen Freud DO   Signed by:   Loreen Freud DO on 02/10/2010   Method used:   Electronically to        [facility] [address] [id number]* (retail)       [address]       [place], [place]  [address]       Ph: [phone] or [phone]       Fax: [phone]   RxID:   [id number]    Orders Added: 1)  Est. Patient Level III [48546]

## 2010-03-24 ENCOUNTER — Telehealth: Payer: Self-pay | Admitting: Family Medicine

## 2010-03-29 ENCOUNTER — Telehealth: Payer: Self-pay | Admitting: Family Medicine

## 2010-03-30 ENCOUNTER — Encounter (INDEPENDENT_AMBULATORY_CARE_PROVIDER_SITE_OTHER): Payer: Self-pay | Admitting: *Deleted

## 2010-03-30 ENCOUNTER — Other Ambulatory Visit (INDEPENDENT_AMBULATORY_CARE_PROVIDER_SITE_OTHER): Payer: Medicare Other

## 2010-03-30 ENCOUNTER — Other Ambulatory Visit: Payer: Self-pay | Admitting: Family Medicine

## 2010-03-30 ENCOUNTER — Telehealth (INDEPENDENT_AMBULATORY_CARE_PROVIDER_SITE_OTHER): Payer: Self-pay | Admitting: *Deleted

## 2010-03-30 DIAGNOSIS — E119 Type 2 diabetes mellitus without complications: Secondary | ICD-10-CM

## 2010-03-30 LAB — BASIC METABOLIC PANEL
BUN: 20 mg/dL (ref 6–23)
CO2: 26 mEq/L (ref 19–32)
Calcium: 9.3 mg/dL (ref 8.4–10.5)
Chloride: 103 mEq/L (ref 96–112)
Creatinine, Ser: 1.3 mg/dL (ref 0.4–1.5)
Glucose, Bld: 140 mg/dL — ABNORMAL HIGH (ref 70–99)

## 2010-03-31 NOTE — Progress Notes (Signed)
Summary: Test strips rx  Phone Note Call from Patient Call back at Work Phone [phone]   Summary of Call: Patient left message on triage that he uses Abbott Precision Extra test strips and would like them sent to mail order--[facility] in order to save money. He notes that he would like boxes of #50 to prevent them expiring.   Patient aware that prescription was sent. He is also aware that he needs labs, but states he will come in for them once he gets his dermatology issue taken care of. Initial call taken by: Lucious Groves CMA,  March 24, 2010 11:29 AM    New/Updated Medications: PRECISION XTRA BLOOD GLUCOSE  STRP (GLUCOSE BLOOD) use as directed up to twice daily Prescriptions: PRECISION XTRA BLOOD GLUCOSE  STRP (GLUCOSE BLOOD) use as directed up to twice daily  #50 x 3   Entered by:   Lucious Groves CMA   Authorized by:   Loreen Freud DO   Signed by:   Lucious Groves CMA on 03/24/2010   Method used:   Faxed to ...       [facility] (mail[facility]order[facility]       [address] [place], [place]  [address]       Ph: [phone]       Fax: [phone]   RxID:   [id number]

## 2010-04-01 ENCOUNTER — Encounter: Payer: Self-pay | Admitting: Family Medicine

## 2010-04-06 NOTE — Progress Notes (Signed)
Summary: refill  Phone Note Refill Request Message from:  Fax from Pharmacy on March 30, 2010 8:58 AM  Refills Requested: Medication #1:  PRECISION XTRA BLOOD GLUCOSE  STRP use as directed up to twice daily. Jocelyn Lamer  - fax [phone]  Initial call taken by: Okey Regal Spring,  March 30, 2010 8:59 AM    Prescriptions: PRECISION XTRA BLOOD GLUCOSE  STRP (GLUCOSE BLOOD) use as directed up to twice daily  #200 x 3   Entered by:   Almeta Monas CMA (AAMA)   Authorized by:   Loreen Freud DO   Signed by:   Almeta Monas CMA (AAMA) on 03/30/2010   Method used:   Faxed to ...       [facility] (mail[facility]order)       [address] [place], [place]  [address]       Ph: [phone]       Fax: [phone]   RxID:   [id number]

## 2010-04-06 NOTE — Progress Notes (Signed)
Summary: dose changed---FYI  Phone Note Call from Patient Call back at Work Phone [phone]   Summary of Call: patient wanted change of dose noted in chart -dose was changed from 1 tab to 1/2 tak a day -patient was taking 1/2 tab of ameryl blood sugar was going up he increased it to 1 tab a day Initial call taken by: Okey Regal Spring,  March 29, 2010 11:10 AM  Follow-up for Phone Call        Spoke with patient and he was prescribed Amaryl 4mg  1 po qd by Dr.lowne, stated when he came in and saw Dr.Hopper in November he was advised  to cut Amaryl down to half a tablet everyday. Patient wanted to let Dr.Lowne know that his CBG's had been running higher and for the last 2 weeks he had been taking 1 full tablet of the Amaryl, stated he will have labs done in the morning and wanted Dr. Laury Axon to be aware of what was going on.....  Follow-up by: Almeta Monas CMA Duncan Dull),  March 29, 2010 3:52 PM  Additional Follow-up for Phone Call Additional follow up Details #1::        noted Additional Follow-up by: Loreen Freud DO,  March 29, 2010 4:24 PM    New/Updated Medications: AMARYL 4 MG  TABS (GLIMEPIRIDE) 1 by mouth once daily (A1c)

## 2010-04-15 NOTE — Medication Information (Signed)
Summary: Order for Diabetic Test Strips  Order for Diabetic Test Strips   Imported By: Maryln Gottron 04/06/2010 13:46:18  _____________________________________________________________________  External Attachment:    Type:   Image     Comment:   External Document

## 2010-04-28 ENCOUNTER — Encounter: Payer: Self-pay | Admitting: Family Medicine

## 2010-04-28 ENCOUNTER — Ambulatory Visit (INDEPENDENT_AMBULATORY_CARE_PROVIDER_SITE_OTHER): Payer: Medicare Other | Admitting: Family Medicine

## 2010-04-28 VITALS — BP 124/72 | HR 100 | Temp 98.4°F | Wt 220.6 lb

## 2010-04-28 DIAGNOSIS — E119 Type 2 diabetes mellitus without complications: Secondary | ICD-10-CM

## 2010-04-28 DIAGNOSIS — R109 Unspecified abdominal pain: Secondary | ICD-10-CM

## 2010-04-28 LAB — POCT URINALYSIS DIPSTICK
Bilirubin, UA: NEGATIVE
Glucose, UA: NEGATIVE
Ketones, UA: NEGATIVE
Leukocytes, UA: NEGATIVE
Nitrite, UA: NEGATIVE
pH, UA: 5

## 2010-04-28 LAB — POCT CBG (FASTING - GLUCOSE)-MANUAL ENTRY: Glucose Fasting, POC: 160 mg/dL

## 2010-04-28 MED ORDER — CIPROFLOXACIN HCL 500 MG PO TABS: 500.0000 mg | ORAL_TABLET | Freq: Two times a day (BID) | ORAL | Status: AC

## 2010-04-28 MED ORDER — METRONIDAZOLE 500 MG PO TABS: 500.0000 mg | ORAL_TABLET | Freq: Three times a day (TID) | ORAL | Status: AC

## 2010-04-28 NOTE — Progress Notes (Signed)
Addended by: Floydene Flock on: 04/28/2010 04:55 PM   Modules accepted: Orders

## 2010-04-28 NOTE — Assessment & Plan Note (Signed)
Take abx If pain no better we will check CT If pain worsens go to ER

## 2010-04-28 NOTE — Progress Notes (Signed)
  Subjective:    Patient ID: Bruce Romero, male    DOB: 1945/11/16, 65 y.o.   MRN: [id number]  Abdominal Pain This is a new problem. The current episode started in the past 7 days. The onset quality is gradual. The problem occurs intermittently. The most recent episode lasted 7 days. The problem has been unchanged. The pain is located in the LLQ and LUQ. The pain is at a severity of 3/10. The pain is mild. The quality of the pain is aching. The abdominal pain does not radiate. Associated symptoms include diarrhea. Pertinent negatives include no anorexia, arthralgias, belching, constipation, dysuria, fever, flatus, frequency, headaches, hematochezia, hematuria, melena, myalgias, nausea, vomiting or weight loss. The pain is aggravated by drinking alcohol. The pain is relieved by bowel movements. He has tried nothing for the symptoms. His past medical history is significant for abdominal surgery, gallstones and GERD. There is no history of colon cancer, Crohn's disease, irritable bowel syndrome, PUD or ulcerative colitis.      Review of Systems  Constitutional: Negative for fever and weight loss.  Gastrointestinal: Positive for abdominal pain and diarrhea. Negative for nausea, vomiting, constipation, melena, hematochezia, anorexia and flatus.  Genitourinary: Negative for dysuria, frequency and hematuria.  Musculoskeletal: Negative for myalgias and arthralgias.  Neurological: Negative for headaches.  pt had 1 episode only of watery stool.  Today it is normal.  Pt BS this am was 225 after eating grapes and an apple.     Objective:   Physical Exam  Constitutional: He appears well-developed and well-nourished. No distress.  Cardiovascular: Normal rate and regular rhythm.   Pulmonary/Chest: Effort normal and breath sounds normal.  Abdominal: Soft. Normal appearance and bowel sounds are normal. He exhibits no fluid wave and no ascites. There is tenderness in the left lower quadrant. There is no  rigidity, no guarding, no tenderness at McBurney's point and negative Murphy's sign.  Skin: He is not diaphoretic.          Assessment & Plan:

## 2010-04-28 NOTE — Patient Instructions (Signed)
Finish antibiotics If symptoms do not improve or worsen return to office or go to ER

## 2010-04-29 LAB — CBC WITH DIFFERENTIAL/PLATELET
Basophils Relative: 0.3 % (ref 0.0–3.0)
Eosinophils Absolute: 0.2 10*3/uL (ref 0.0–0.7)
Eosinophils Relative: 2.2 % (ref 0.0–5.0)
Lymphocytes Relative: 13.8 % (ref 12.0–46.0)
Monocytes Absolute: 0.5 10*3/uL (ref 0.1–1.0)
Neutrophils Relative %: 78 % — ABNORMAL HIGH (ref 43.0–77.0)
Platelets: 170 10*3/uL (ref 150.0–400.0)
RBC: 5.06 Mil/uL (ref 4.22–5.81)
WBC: 9.4 10*3/uL (ref 4.5–10.5)

## 2010-04-29 LAB — HEPATIC FUNCTION PANEL
Alkaline Phosphatase: 54 U/L (ref 39–117)
Bilirubin, Direct: 0.2 mg/dL (ref 0.0–0.3)
Total Bilirubin: 0.6 mg/dL (ref 0.3–1.2)
Total Protein: 7.4 g/dL (ref 6.0–8.3)

## 2010-04-29 LAB — BASIC METABOLIC PANEL
BUN: 21 mg/dL (ref 6–23)
Chloride: 96 mEq/L (ref 96–112)
GFR: 66.34 mL/min (ref >60.00)
Potassium: 4.3 mEq/L (ref 3.5–5.1)
Sodium: 132 mEq/L — ABNORMAL LOW (ref 135–145)

## 2010-04-29 LAB — LIPASE: Lipase: 45 U/L (ref 11.0–59.0)

## 2010-05-05 ENCOUNTER — Telehealth: Payer: Self-pay | Admitting: Family Medicine

## 2010-05-05 MED ORDER — ESOMEPRAZOLE MAGNESIUM 40 MG PO CPDR: 40.0000 mg | DELAYED_RELEASE_CAPSULE | Freq: Every day | ORAL | Status: DC

## 2010-05-05 MED ORDER — SAXAGLIPTIN-METFORMIN ER 5-500 MG PO TB24: 1.0000 | ORAL_TABLET | Freq: Every day | ORAL | Status: DC

## 2010-05-05 MED ORDER — QUINAPRIL HCL 20 MG PO TABS: 20.0000 mg | ORAL_TABLET | Freq: Every day | ORAL | Status: DC

## 2010-05-05 MED ORDER — NIACIN-LOVASTATIN ER 1000-40 MG PO TB24: 1.0000 mg | ORAL_TABLET | Freq: Every day | ORAL | Status: DC

## 2010-05-05 MED ORDER — SPIRONOLACTONE 25 MG PO TABS: 25.0000 mg | ORAL_TABLET | Freq: Every day | ORAL | Status: DC

## 2010-05-05 MED ORDER — AMLODIPINE BESYLATE 10 MG PO TABS: 10.0000 mg | ORAL_TABLET | Freq: Every day | ORAL | Status: DC

## 2010-05-05 NOTE — Telephone Encounter (Signed)
RX sent      KP

## 2010-05-05 NOTE — Telephone Encounter (Signed)
nexium 40 mg - advicor 1000/40 mg - spironolactone 25 mg - amlodipine 10mg  - kombiglyz xr 5/500 mg - quinapril 20mg   - [facility]

## 2010-05-10 ENCOUNTER — Other Ambulatory Visit: Payer: Self-pay | Admitting: Family Medicine

## 2010-05-10 ENCOUNTER — Telehealth: Payer: Self-pay | Admitting: *Deleted

## 2010-05-10 DIAGNOSIS — R109 Unspecified abdominal pain: Secondary | ICD-10-CM

## 2010-05-10 NOTE — Telephone Encounter (Signed)
Pt still c/o abdominal pain radiate to his back, and some loose stool but not diarrhea.Pt wife notes that next step was CT scan. Pt wife also indicated that a family member suggested IV antibiotic as a option. Pt uses [facility] [address]. Pls advise.

## 2010-05-10 NOTE — Telephone Encounter (Signed)
Order CT abd and pelvis with contrast   Re- L sided abd pain

## 2010-05-10 NOTE — Telephone Encounter (Signed)
Pt aware awaiting appt info.

## 2010-05-11 ENCOUNTER — Ambulatory Visit (HOSPITAL_BASED_OUTPATIENT_CLINIC_OR_DEPARTMENT_OTHER)
Admission: RE | Admit: 2010-05-11 | Discharge: 2010-05-11 | Disposition: A | Discharge status: Home / Self Care | Payer: Medicare Other | Source: Ambulatory Visit | Attending: Family Medicine | Admitting: Family Medicine

## 2010-05-11 DIAGNOSIS — R197 Diarrhea, unspecified: Secondary | ICD-10-CM

## 2010-05-11 DIAGNOSIS — R109 Unspecified abdominal pain: Secondary | ICD-10-CM

## 2010-05-11 MED ORDER — IOHEXOL 300 MG/ML  SOLN
100.0000 mL | Freq: Once | INTRAMUSCULAR | Status: AC | PRN
  Administered 2010-05-11: 100 mL via INTRAVENOUS

## 2010-05-12 ENCOUNTER — Telehealth: Payer: Self-pay | Admitting: Family Medicine

## 2010-05-12 NOTE — Telephone Encounter (Signed)
Patient called about results of CT scan.  Patient was informed that the test was normal.  He still complains of dull pain that comes and go.  His stool is soft, not liquidy.  Wants to know what he should do next.

## 2010-05-12 NOTE — Telephone Encounter (Signed)
We can refer to GI.

## 2010-05-12 NOTE — Telephone Encounter (Signed)
Referral put in--Pt aware an has been advised to continue to watch diet and if symptoms worsens to come in or go to the ED, He voiced understanding and agreed      KP

## 2010-06-25 NOTE — H&P (Signed)
NAME:  Bruce Romero, Bruce Romero                        ACCOUNT NO.:  [id number]   MEDICAL RECORD NO.:  [id number]                   PATIENT TYPE:  EMS   LOCATION:  MINO                                 FACILITY:  [facility]   PHYSICIAN:  Bruce H. Swords, M.D. [facility]           DATE OF BIRTH:  [birth year]   DATE OF ADMISSION:  10/14/2002  DATE OF DISCHARGE:                                HISTORY & PHYSICAL   CHIEF COMPLAINT:  Chest pain.   HISTORY OF PRESENT ILLNESS:  Mr. Heuring is a 65 year old gentleman who  had chest pain at approximately 2:30 p.m. while driving today.  He had a 5-  minute episode of substernal chest pain which he describes as a deep ache.  This was not associated with any nausea, vomiting, diaphoresis.  There was  no radiation of the pain.  He had no other complaints at that time other  than that he felt a little bit lightheaded.  He drove all the way home  (another hour to two), took his blood pressure several times which was in  the 160s/90s range with a pulse in the 90 to 100 range.  He decided to come  to the emergency department for further evaluation.  No other complaints  voiced, and he has no complaints in the Review of Systems.   PAST MEDICAL HISTORY:  1. Recent chest wall cyst for which he was prescribed Augmentin     approximately one week ago.  2. Type 2 diabetes.  3. Hypertension.  4. Gastroesophageal reflux disease.  5. Recently treated for depression.   CURRENT MEDICATIONS:  1. Wellbutrin XL 200 mg p.o. daily (first dose at 300 mg was today).  2. Norvasc 5 mg p.o. daily.  3. Accupril 10 mg p.o. daily.  4. HCTZ 12.5 mg p.o. daily.  5. Amaryl 4 mg p.o. daily.  6. Singulair 10 mg p.o. daily.  7. Rhinocort 2 puffs each nostril daily.  8. Nexium 40 mg p.o. daily.  9. Aspirin 81 mg p.o. daily.   ALLERGIES:  KEFLEX.   FAMILY HISTORY:  Brother with coronary artery disease in his early 34s.  Father deceased with esophageal cancer.  Mother deceased with  Alzheimer's  and strokes.   SOCIAL HISTORY:  He is an ex-smoker for 15 years.  He works in [employer], broadcasting.   PHYSICAL EXAMINATION:  VITAL SIGNS:  Temperature 97.7.  At the time of my  evaluation, blood pressure was 143/83, pulse 100, respirations 14.  Initial  blood pressure was 186/97.  GENERAL:  Well developed, well nourished, mildly obese male in no acute  distress.  HEENT:  Atraumatic, normocephalic.  Extraocular muscles intact.  NECK:  Supple without lymphadenopathy, thyromegaly, jugular venous  distention, or carotid bruits.  CHEST:  Clear to auscultation without any increased work of breathing.  CARDIAC:  S1 and S2 are normal without significant murmurs or gallops.  There is a right parasternal chest wall  cyst, nontender to palpation.  ABDOMEN:  Overweight.  Active bowel sounds.  Soft, nontender.  There is no  hepatosplenomegaly.  No masses are palpated.  EXTREMITIES:  No clubbing, cyanosis, or edema.  NEUROLOGIC:  Alert and oriented without motor or sensory deficits.   LABORATORY DATA:  EKG was obtained that demonstrates normal sinus rhythm,  minor ST and T wave changes.   D-dimer 0.3 (normal 0.0 to 0.48), creatinine 1.3, potassium 3.1, glucose  150, bicarbonate 27.  Myoglobin slightly elevated at 216.  Troponin I less  than 0.05.  CK-MB 3.3.  Repeat myoglobin was 303.   ASSESSMENT AND PLAN:  1. Chest pain with multiple risk factors.  He needs further evaluation,     especially in light of the mildly elevated myoglobin.  I will admit to     the hospital.  Will ask for a stress Cardiolite either tomorrow or early     this week as an outpatient.  I think he needs evaluation in the hospital     to rule out for an myocardial infarction.  2. Chest wall cyst.  No response to Augmentin.  Will be discontinued.  3. Depression.  He thinks his blood pressure is somewhat elevated because of     the Wellbutrin and will discontinue at this time.  4. Hypertension.  Will continue on  current medications at this time.  5. Hypokalemia.  Needs replacement at this time.  Likely hypokalemia is     secondary to diuretic use.                                                Bruce Rexene Edison Swords, M.D. [facility]    BHS/MEDQ  D:  10/14/2002  T:  10/14/2002  Job:  [id number]   cc:   Angelena Sole, M.D. [facility]

## 2010-06-25 NOTE — Discharge Summary (Signed)
   NAMELOUDEN, HOUSEWORTH                        ACCOUNT NO.:  [id number]   MEDICAL RECORD NO.:  [id number]                   PATIENT TYPE:  INP   LOCATION:  3709                                 FACILITY:  [facility]   PHYSICIAN:  Rene Paci, M.D. [facility]          DATE OF BIRTH:  1945-10-06   DATE OF ADMISSION:  10/14/2002  DATE OF DISCHARGE:  10/15/2002                                 DISCHARGE SUMMARY   DISCHARGE DIAGNOSES:  1. Chest pain.  2. Myocardial infarction ruled out.  3. Hypokalemia.   BRIEF ADMISSION HISTORY:  Mr. Puccinelli is a 65 year old male who presented  with complaints of chest pain.  The patient was admitted to rule out  myocardial infarction.   HOSPITAL COURSE:  CARDIOVASCULAR:  The patient did rule out for myocardial  infarction.  He was scheduled for an exercise Cardiolite.  This was negative  for ischemia, and he was felt to be stable for discharge home.   DISCHARGE MEDICATIONS:  He was instructed to resume his home medications.   Follow up with Dr. Ruthine Dose on Thursday, October 17, 2002, at 11:30 a.m.        Cornell Barman, P.A. [facility]                  Rene Paci, M.D. [facility]    LC/MEDQ  D:  11/06/2002  T:  11/06/2002  Job:  [id number]   cc:   Angelena Sole, M.D. [facility]

## 2010-06-29 ENCOUNTER — Other Ambulatory Visit: Payer: Self-pay | Admitting: *Deleted

## 2010-06-29 DIAGNOSIS — E119 Type 2 diabetes mellitus without complications: Secondary | ICD-10-CM

## 2010-06-30 ENCOUNTER — Other Ambulatory Visit (INDEPENDENT_AMBULATORY_CARE_PROVIDER_SITE_OTHER): Payer: Medicare Other

## 2010-06-30 DIAGNOSIS — E119 Type 2 diabetes mellitus without complications: Secondary | ICD-10-CM

## 2010-06-30 LAB — BASIC METABOLIC PANEL
BUN: 13 mg/dL (ref 6–23)
Chloride: 104 mEq/L (ref 96–112)
GFR: 82.32 mL/min (ref >60.00)
Potassium: 4.3 mEq/L (ref 3.5–5.1)
Sodium: 139 mEq/L (ref 135–145)

## 2010-06-30 LAB — HEPATIC FUNCTION PANEL
ALT: 31 U/L (ref 0–53)
AST: 31 U/L (ref 0–37)
Alkaline Phosphatase: 44 U/L (ref 39–117)
Bilirubin, Direct: 0.1 mg/dL (ref 0.0–0.3)
Total Bilirubin: 0.6 mg/dL (ref 0.3–1.2)

## 2010-06-30 LAB — MICROALBUMIN / CREATININE URINE RATIO
Creatinine,U: 229.7 mg/dL
Microalb, Ur: 1.4 mg/dL (ref 0.0–1.9)

## 2010-06-30 LAB — LIPID PANEL
LDL Cholesterol: 28 mg/dL (ref 0–99)
Total CHOL/HDL Ratio: 2
VLDL: 14.4 mg/dL (ref 0.0–40.0)

## 2010-06-30 LAB — HEMOGLOBIN A1C: Hgb A1c MFr Bld: 6.2 % (ref 4.6–6.5)

## 2010-07-01 ENCOUNTER — Ambulatory Visit: Payer: Medicare Other | Admitting: Family Medicine

## 2010-07-06 ENCOUNTER — Other Ambulatory Visit: Payer: Self-pay | Admitting: Family Medicine

## 2010-07-07 ENCOUNTER — Encounter: Payer: Self-pay | Admitting: *Deleted

## 2010-07-07 MED ORDER — NIACIN-LOVASTATIN ER 1000-40 MG PO TB24: 1.0000 mg | ORAL_TABLET | Freq: Every day | ORAL | Status: DC

## 2010-07-07 NOTE — Telephone Encounter (Signed)
Faxed.   KP 

## 2010-07-14 ENCOUNTER — Encounter: Payer: Self-pay | Admitting: Family Medicine

## 2010-07-20 ENCOUNTER — Ambulatory Visit (INDEPENDENT_AMBULATORY_CARE_PROVIDER_SITE_OTHER): Payer: Medicare Other | Admitting: Family Medicine

## 2010-07-20 ENCOUNTER — Encounter: Payer: Self-pay | Admitting: Family Medicine

## 2010-07-20 VITALS — BP 130/88 | HR 84 | Temp 97.3°F | Wt 215.8 lb

## 2010-07-20 DIAGNOSIS — S161XXA Strain of muscle, fascia and tendon at neck level, initial encounter: Secondary | ICD-10-CM

## 2010-07-20 DIAGNOSIS — S139XXA Sprain of joints and ligaments of unspecified parts of neck, initial encounter: Secondary | ICD-10-CM

## 2010-07-20 DIAGNOSIS — J329 Chronic sinusitis, unspecified: Secondary | ICD-10-CM

## 2010-07-20 MED ORDER — FLUTICASONE PROPIONATE 50 MCG/ACT NA SUSP: 2.0000 | Freq: Every day | NASAL | Status: DC

## 2010-07-20 MED ORDER — AMOXICILLIN-POT CLAVULANATE 875-125 MG PO TABS: 1.0000 | ORAL_TABLET | Freq: Two times a day (BID) | ORAL | Status: DC

## 2010-07-20 MED ORDER — CYCLOBENZAPRINE HCL 10 MG PO TABS: 10.0000 mg | ORAL_TABLET | Freq: Three times a day (TID) | ORAL | Status: DC | PRN

## 2010-07-20 MED ORDER — MELOXICAM 15 MG PO TABS: 15.0000 mg | ORAL_TABLET | Freq: Every day | ORAL | Status: DC

## 2010-07-20 NOTE — Progress Notes (Signed)
  Subjective:     Bruce Romero is a 65 y.o. male who presents for evaluation of sinus pain. Symptoms include: congestion, nasal congestion and sinus pressure. Onset of symptoms was 4 days ago. Symptoms have been gradually worsening since that time. Past history is significant for no history of pneumonia or bronchitis. Patient is a non-smoker.  The following portions of the patient's history were reviewed and updated as appropriate: allergies, current medications, past family history, past medical history, past social history, past surgical history and problem list.  Review of Systems Pertinent items are noted in HPI.   Objective:    BP 130/88  Pulse 84  Temp(Src) 97.3 F (36.3 C) (Oral)  Wt 215 lb 12.8 oz (97.886 kg)  SpO2 98% General appearance: alert, cooperative, appears stated age and no distress Ears: normal TM's and external ear canals both ears Nose: Nares normal. Septum midline. Mucosa normal. No drainage or sinus tenderness., moderate congestion, sinus tenderness bilateral Throat: lips, mucosa, and tongue normal; teeth and gums normal Neck: no adenopathy, no carotid bruit, no JVD, supple, symmetrical, trachea midline and thyroid not enlarged, symmetric, no tenderness/mass/nodules Lungs: clear to auscultation bilaterally Heart: regular rate and rhythm, S1, S2 normal, no murmur, click, rub or gallop Extremities: extremities normal, atraumatic, no cyanosis or edema Skin: Skin color, texture, turgor normal. No rashes or lesions   neck--+ muscle spasm R trap Assessment:    Acute bacterial sinusitis.   cervical sprain Plan:    Nasal steroids per medication orders. Augmentin per medication orders. f/u prn  Flexeril  mobic Moist heat to neck /' shoulder

## 2010-07-20 NOTE — Patient Instructions (Signed)

## 2010-08-17 ENCOUNTER — Other Ambulatory Visit: Payer: Self-pay | Admitting: *Deleted

## 2010-08-17 MED ORDER — AMLODIPINE BESYLATE 10 MG PO TABS: 10.0000 mg | ORAL_TABLET | Freq: Every day | ORAL | Status: DC

## 2010-08-17 MED ORDER — NIACIN-LOVASTATIN ER 1000-40 MG PO TB24: 1.0000 mg | ORAL_TABLET | Freq: Every day | ORAL | Status: DC

## 2010-08-17 MED ORDER — SPIRONOLACTONE 25 MG PO TABS: 25.0000 mg | ORAL_TABLET | Freq: Every day | ORAL | Status: DC

## 2010-08-17 MED ORDER — SAXAGLIPTIN-METFORMIN ER 5-500 MG PO TB24: 1.0000 | ORAL_TABLET | Freq: Every day | ORAL | Status: DC

## 2010-08-17 MED ORDER — GLIMEPIRIDE 4 MG PO TABS: 4.0000 mg | ORAL_TABLET | Freq: Every day | ORAL | Status: DC

## 2010-08-17 MED ORDER — ESOMEPRAZOLE MAGNESIUM 40 MG PO CPDR: 40.0000 mg | DELAYED_RELEASE_CAPSULE | Freq: Every day | ORAL | Status: DC

## 2010-08-17 MED ORDER — QUINAPRIL HCL 20 MG PO TABS: 20.0000 mg | ORAL_TABLET | Freq: Every day | ORAL | Status: DC

## 2010-08-17 NOTE — Telephone Encounter (Signed)
Pt left message requesting refills of all of the above be sent in 90 day supply with refills to [facility]. He notes that he is almost out of Advicor and needs the 90 day rx as well as 30 day supply sent to [facility] on [address].

## 2010-08-17 NOTE — Telephone Encounter (Signed)
Rx faxed to the pharmacy and patient had been made aware   KP

## 2010-08-18 ENCOUNTER — Encounter: Payer: Self-pay | Admitting: *Deleted

## 2010-08-24 ENCOUNTER — Telehealth: Payer: Self-pay | Admitting: *Deleted

## 2010-08-24 NOTE — Telephone Encounter (Signed)
Fax from pharmacy states that [facility] has processed a test claim for requested medication and is paying. Appeal is not necessary at this time. Fax letter scan to chart.

## 2010-08-25 ENCOUNTER — Telehealth: Payer: Self-pay | Admitting: *Deleted

## 2010-08-25 DIAGNOSIS — M542 Cervicalgia: Secondary | ICD-10-CM

## 2010-08-25 DIAGNOSIS — M25519 Pain in unspecified shoulder: Secondary | ICD-10-CM

## 2010-08-25 NOTE — Telephone Encounter (Signed)
Pt still c/o tenderness and pain in neck and shoulder area. Pt is now requesting to see specialist for issue. See OV  07-20-10 Please advise

## 2010-08-25 NOTE — Telephone Encounter (Signed)
Pt aware referral put in. 

## 2010-08-25 NOTE — Telephone Encounter (Signed)
Refer to ortho for neck and shoulder pain

## 2010-09-01 ENCOUNTER — Other Ambulatory Visit: Payer: Self-pay | Admitting: *Deleted

## 2010-09-01 MED ORDER — NIACIN-LOVASTATIN ER 1000-40 MG PO TB24: 1.0000 mg | ORAL_TABLET | Freq: Every day | ORAL | Status: DC

## 2010-09-02 ENCOUNTER — Other Ambulatory Visit: Payer: Self-pay | Admitting: Family Medicine

## 2010-09-02 MED ORDER — NIACIN-LOVASTATIN ER 1000-40 MG PO TB24: 1.0000 | ORAL_TABLET | Freq: Every day | ORAL | Status: DC

## 2010-09-02 NOTE — Telephone Encounter (Signed)
Refaxed   KP 

## 2010-09-08 NOTE — Telephone Encounter (Signed)
Pt called back stating that he still has not received med. Pt contacted [employer] and was advise that a appeal letter needs to be submitted in order for them to fill advicor. Advise Pt that per faxed we received appeal was not needed but will give [facility] a call to determine what is needed. [facility]oke with [facility] who states that for some reason they process the wrong med which was the amlodipine so that why a paid claim fax was sent. Re-faxed appeal letter to [phone] awaiting response. Pt made aware of process.

## 2010-09-09 ENCOUNTER — Telehealth: Payer: Self-pay

## 2010-09-09 NOTE — Telephone Encounter (Signed)
Call from [facility] and they stated that the Advicor is not covered under patients insurance anymore and she wanted to know if you would like to change to Simcor. Please advise    KP

## 2010-09-10 MED ORDER — NIACIN-SIMVASTATIN ER 1000-40 MG PO TB24: 1.0000 | ORAL_TABLET | Freq: Every day | ORAL | Status: DC

## 2010-09-10 NOTE — Telephone Encounter (Signed)
Yes ---- ok to switch--- same dose  simcor 1000/40  Should have labs done in 2 months unless already scheduled

## 2010-09-10 NOTE — Telephone Encounter (Signed)
Left Pt detail message of med change and Rx sent in to pharmacy.

## 2010-11-17 ENCOUNTER — Other Ambulatory Visit: Payer: Self-pay | Admitting: Family Medicine

## 2010-11-17 NOTE — Telephone Encounter (Signed)
The pt is requesting refills through [employer] on:   Fluticasone - 50 mg Spironotactone - 25mg   AmLODipine - 10mg  Glimepiride - 4mg  Saxagliptin-Metformin 5-500mg  tb24  Quinapril - 20mg   Esomeprazole - 40 mg

## 2010-11-18 MED ORDER — ESOMEPRAZOLE MAGNESIUM 40 MG PO CPDR: 40.0000 mg | DELAYED_RELEASE_CAPSULE | Freq: Every day | ORAL | Status: DC

## 2010-11-18 MED ORDER — AMLODIPINE BESYLATE 10 MG PO TABS: 10.0000 mg | ORAL_TABLET | Freq: Every day | ORAL | Status: DC

## 2010-11-18 MED ORDER — QUINAPRIL HCL 20 MG PO TABS: 20.0000 mg | ORAL_TABLET | Freq: Every day | ORAL | Status: DC

## 2010-11-18 MED ORDER — GLIMEPIRIDE 4 MG PO TABS: 4.0000 mg | ORAL_TABLET | Freq: Every day | ORAL | Status: DC

## 2010-11-18 MED ORDER — SPIRONOLACTONE 25 MG PO TABS: 25.0000 mg | ORAL_TABLET | Freq: Every day | ORAL | Status: DC

## 2010-11-18 MED ORDER — FLUTICASONE PROPIONATE 50 MCG/ACT NA SUSP: 2.0000 | Freq: Every day | NASAL | Status: DC

## 2010-11-18 MED ORDER — SAXAGLIPTIN-METFORMIN ER 5-500 MG PO TB24: 1.0000 | ORAL_TABLET | Freq: Every day | ORAL | Status: DC

## 2010-11-18 NOTE — Telephone Encounter (Signed)
All labs are current---Rx faxed    KP

## 2010-11-25 ENCOUNTER — Ambulatory Visit (INDEPENDENT_AMBULATORY_CARE_PROVIDER_SITE_OTHER): Payer: Medicare Other | Admitting: Family Medicine

## 2010-11-25 VITALS — BP 150/78 | HR 88 | Temp 97.6°F | Ht 70.0 in | Wt 224.0 lb

## 2010-11-25 DIAGNOSIS — R05 Cough: Secondary | ICD-10-CM

## 2010-11-25 DIAGNOSIS — J019 Acute sinusitis, unspecified: Secondary | ICD-10-CM

## 2010-11-25 DIAGNOSIS — J3489 Other specified disorders of nose and nasal sinuses: Secondary | ICD-10-CM

## 2010-11-25 DIAGNOSIS — J329 Chronic sinusitis, unspecified: Secondary | ICD-10-CM

## 2010-11-25 MED ORDER — AMOXICILLIN-POT CLAVULANATE 875-125 MG PO TABS: 1.0000 | ORAL_TABLET | Freq: Two times a day (BID) | ORAL | Status: AC

## 2010-11-25 MED ORDER — FLUTICASONE PROPIONATE 50 MCG/ACT NA SUSP: 2.0000 | Freq: Every day | NASAL | Status: DC

## 2010-11-25 NOTE — Progress Notes (Signed)
  Subjective:    Patient ID: Bruce Romero, male    DOB: [birth year], 65 y.o.   MRN: [id number]  HPI URI- sxs started yesterday w/ sore throat.  + sinus pressure, 'tenderness'.  'typically i get 2-3 sinus infxns per year'.  Did the Netti pot this AM w/ some blood on the return.  + cough- nonproductive, nasal congestion.  No ear pain, fevers.  No known sick contacts.  Taking Allegra daily.   Review of Systems For ROS see HPI     Objective:   Physical Exam  Constitutional: He appears well-developed and well-nourished. No distress.  HENT:  Head: Normocephalic and atraumatic.  Right Ear: Tympanic membrane normal.  Left Ear: Tympanic membrane normal.  Nose: Mucosal edema and rhinorrhea present. Right sinus exhibits maxillary sinus tenderness and frontal sinus tenderness. Left sinus exhibits maxillary sinus tenderness and frontal sinus tenderness.  Mouth/Throat: Mucous membranes are normal. Oropharyngeal exudate and posterior oropharyngeal erythema present. No posterior oropharyngeal edema.       + PND  Eyes: Conjunctivae and EOM are normal. Pupils are equal, round, and reactive to light.  Neck: Normal range of motion. Neck supple.  Cardiovascular: Normal rate, regular rhythm and normal heart sounds.   Pulmonary/Chest: Effort normal and breath sounds normal. No respiratory distress. He has no wheezes.       + hacking cough  Lymphadenopathy:    He has no cervical adenopathy.  Skin: Skin is warm and dry.          Assessment & Plan:

## 2010-11-25 NOTE — Patient Instructions (Signed)
This appears to be an early sinus infection Take the Augmentin (w/ food) for 10 days (you will have 8 extra pills- you can take these only if needed) Restart the Flonase Drink plenty of fluids Add Mucinex to thin your congestion REST! Hang in there!!!

## 2010-11-25 NOTE — Assessment & Plan Note (Signed)
Pt's PE consistent w/ infxn.  Start Augmentin as this is what pt typically takes.  Reviewed supportive care and red flags that should prompt return.  Pt expressed understanding and is in agreement w/ plan.

## 2010-11-30 ENCOUNTER — Telehealth: Payer: Self-pay | Admitting: *Deleted

## 2010-11-30 NOTE — Telephone Encounter (Signed)
Pt left VM that he still c/o chest congestion despite using mucinex and antibiotic. Pt is not requesting a inhaler to help with chest congestion  Spoke with Pt who denies any SOB or wheezing. Pt does states that he does have a ongoing dry cough with very little production. .Please advise

## 2010-11-30 NOTE — Telephone Encounter (Signed)
Albuterol inhalers can have side effects- increased blood pressure, palpitations.  Since he was treated for a sinus infection and not a respiratory illness he should be seen, or ask if his PCP is ok w/ giving this

## 2010-11-30 NOTE — Telephone Encounter (Signed)
Discuss with patient  

## 2010-12-08 ENCOUNTER — Other Ambulatory Visit (HOSPITAL_COMMUNITY): Payer: Self-pay | Admitting: Orthopaedic Surgery

## 2010-12-08 DIAGNOSIS — R200 Anesthesia of skin: Secondary | ICD-10-CM

## 2010-12-09 ENCOUNTER — Telehealth: Payer: Self-pay | Admitting: *Deleted

## 2010-12-09 MED ORDER — ALPRAZOLAM 0.25 MG PO TABS: ORAL_TABLET | ORAL | Status: DC

## 2010-12-09 NOTE — Telephone Encounter (Signed)
Discussed with patient and he voiced understanding. Rx faxed     KP 

## 2010-12-09 NOTE — Telephone Encounter (Signed)
Pt left VM that he is schedule to have MRI done on tomorrow and would like to know if Dr Laury Axon can Rx him something to help him relax. .Please advise

## 2010-12-09 NOTE — Telephone Encounter (Signed)
Xanax 0.5 mg  1 po 30 min before procedure----#10    ---tell pt to bring bottle med with him as well and someone else will need to drive him.

## 2010-12-10 ENCOUNTER — Ambulatory Visit (HOSPITAL_COMMUNITY)
Admission: RE | Admit: 2010-12-10 | Discharge: 2010-12-10 | Disposition: A | Discharge status: Home / Self Care | Payer: Medicare Other | Source: Ambulatory Visit | Attending: Orthopaedic Surgery | Admitting: Orthopaedic Surgery

## 2010-12-10 DIAGNOSIS — M542 Cervicalgia: Secondary | ICD-10-CM | POA: Insufficient documentation

## 2010-12-10 DIAGNOSIS — M5030 Other cervical disc degeneration, unspecified cervical region: Secondary | ICD-10-CM | POA: Insufficient documentation

## 2010-12-10 DIAGNOSIS — R200 Anesthesia of skin: Secondary | ICD-10-CM

## 2010-12-10 DIAGNOSIS — M4802 Spinal stenosis, cervical region: Secondary | ICD-10-CM | POA: Insufficient documentation

## 2010-12-10 DIAGNOSIS — M47812 Spondylosis without myelopathy or radiculopathy, cervical region: Secondary | ICD-10-CM | POA: Insufficient documentation

## 2010-12-10 DIAGNOSIS — M25519 Pain in unspecified shoulder: Secondary | ICD-10-CM | POA: Insufficient documentation

## 2010-12-20 ENCOUNTER — Ambulatory Visit (INDEPENDENT_AMBULATORY_CARE_PROVIDER_SITE_OTHER): Payer: Medicare Other | Admitting: Family Medicine

## 2010-12-20 ENCOUNTER — Encounter: Payer: Self-pay | Admitting: Family Medicine

## 2010-12-20 VITALS — BP 144/92 | HR 97 | Temp 98.5°F | Wt 226.2 lb

## 2010-12-20 DIAGNOSIS — E119 Type 2 diabetes mellitus without complications: Secondary | ICD-10-CM

## 2010-12-20 DIAGNOSIS — E876 Hypokalemia: Secondary | ICD-10-CM

## 2010-12-20 MED ORDER — SAXAGLIPTIN-METFORMIN ER 5-1000 MG PO TB24: ORAL_TABLET | ORAL | Status: DC

## 2010-12-20 NOTE — Patient Instructions (Signed)

## 2010-12-20 NOTE — Progress Notes (Signed)
  Subjective:    Patient ID: Bruce Romero, male    DOB: Oct 22, 1945, 65 y.o.   MRN: [id number]  HPI [place] here [place]---pt got up from bed after sneezing and he felt very dizzy.   No NVD.  Pt was found to have low K and high sugar and dehydration.  Pt was given IVF and K and d/c'd home.   Pt has been fine since.     Review of Systems As above    Objective:   Physical Exam  Constitutional: He is oriented to person, place, and time. He appears well-developed and well-nourished.  Cardiovascular: Normal rate and regular rhythm.   Pulmonary/Chest: Effort normal and breath sounds normal.  Neurological: He is alert and oriented to person, place, and time.  Psychiatric: He has a normal mood and affect. His behavior is normal. Judgment and thought content normal.          Assessment & Plan:  Hx dehydration / hypokalemia----improved,  Check labs dmII --check labs today,  BS running high---increase kombiglyza htn Hyperlipidemia--check labs

## 2010-12-21 LAB — LIPID PANEL
LDL Cholesterol: 31 mg/dL (ref 0–99)
VLDL: 25.8 mg/dL (ref 0.0–40.0)

## 2010-12-21 LAB — HEPATIC FUNCTION PANEL
AST: 40 U/L — ABNORMAL HIGH (ref 0–37)
Alkaline Phosphatase: 49 U/L (ref 39–117)
Total Bilirubin: 0.7 mg/dL (ref 0.3–1.2)

## 2010-12-21 LAB — HEMOGLOBIN A1C: Hgb A1c MFr Bld: 6.7 % — ABNORMAL HIGH (ref 4.6–6.5)

## 2010-12-21 LAB — BASIC METABOLIC PANEL
Chloride: 99 mEq/L (ref 96–112)
Potassium: 4.3 mEq/L (ref 3.5–5.1)

## 2010-12-21 NOTE — Progress Notes (Signed)
Addended by: Legrand Como on: 12/21/2010 08:41 AM   Modules accepted: Orders

## 2010-12-22 ENCOUNTER — Telehealth: Payer: Self-pay | Admitting: *Deleted

## 2010-12-22 NOTE — Telephone Encounter (Signed)
Ok for Glucosamine- can take tylenol until Dr Laury Axon is able to review (injection was likely a steroid and he should avoid additional advil/aleve/ibuprofen)

## 2010-12-22 NOTE — Telephone Encounter (Signed)
Discuss with patient  

## 2010-12-22 NOTE — Telephone Encounter (Signed)
Pt left VM that he saw Dr Soundra Pilon who states that the pain was coming from arthritis in C3-C4. Pt notes that he was given injection in office but would like to know what he can take for pain and if it would be ok to take glucosamine chondroitin.Please advise

## 2010-12-23 ENCOUNTER — Telehealth: Payer: Self-pay | Admitting: *Deleted

## 2010-12-23 MED ORDER — SITAGLIP PHOS-METFORMIN HCL ER 100-1000 MG PO TB24: 1.0000 | ORAL_TABLET | Freq: Every day | ORAL | Status: DC

## 2010-12-23 NOTE — Telephone Encounter (Signed)
janumet xr 100/1000 1 po qd #30   5 refills

## 2010-12-23 NOTE — Telephone Encounter (Signed)
Ok to take glucosamine----for other pain meds suggestions he should probably call Dr Regino Schultze.

## 2010-12-23 NOTE — Telephone Encounter (Signed)
Pt called wanting lab results, should he start a potassium supplement? Also states he received letter from insurance that Kombiglyze will no longer be covered after 05/2011. Preferred alternatives will be Janumet, Janumet XR and Jantadueto. Please advise.

## 2010-12-23 NOTE — Telephone Encounter (Signed)
patient made aware and voiced understanding    KP

## 2010-12-23 NOTE — Telephone Encounter (Signed)
discussed with patient and Rx sent to the pharmacy and put on hold. I advised I would call him with his lab results    KP

## 2010-12-28 ENCOUNTER — Telehealth: Payer: Self-pay | Admitting: Family Medicine

## 2010-12-28 NOTE — Telephone Encounter (Signed)
Pt c/o some increased dizziness in AM only when he first awakes and with positioning. Pt thinks that symptoms may be related to recent med changes.. BS 115 2 hour after meal 113  BP last night 143/80. BP has been ranging 150-170 and in the 90s. Pt denies any SOB, numbness, tingling, chest pain. Pt advise ED/UC if symptoms increase or worsen, Pt ok.Please advise

## 2010-12-29 ENCOUNTER — Inpatient Hospital Stay (HOSPITAL_COMMUNITY)
Admission: AD | Admit: 2010-12-29 | Discharge: 2010-12-31 | DRG: 309 | Disposition: A | Discharge status: Home / Self Care | Payer: Medicare Other | Source: Ambulatory Visit | Attending: Internal Medicine | Admitting: Internal Medicine

## 2010-12-29 ENCOUNTER — Encounter (HOSPITAL_COMMUNITY): Payer: Self-pay | Admitting: Emergency Medicine

## 2010-12-29 ENCOUNTER — Emergency Department (HOSPITAL_COMMUNITY): Payer: Medicare Other

## 2010-12-29 ENCOUNTER — Other Ambulatory Visit: Payer: Self-pay

## 2010-12-29 DIAGNOSIS — J069 Acute upper respiratory infection, unspecified: Secondary | ICD-10-CM

## 2010-12-29 DIAGNOSIS — I498 Other specified cardiac arrhythmias: Secondary | ICD-10-CM | POA: Diagnosis present

## 2010-12-29 DIAGNOSIS — H6120 Impacted cerumen, unspecified ear: Secondary | ICD-10-CM

## 2010-12-29 DIAGNOSIS — R350 Frequency of micturition: Secondary | ICD-10-CM

## 2010-12-29 DIAGNOSIS — M25519 Pain in unspecified shoulder: Secondary | ICD-10-CM

## 2010-12-29 DIAGNOSIS — E559 Vitamin D deficiency, unspecified: Secondary | ICD-10-CM

## 2010-12-29 DIAGNOSIS — R109 Unspecified abdominal pain: Secondary | ICD-10-CM

## 2010-12-29 DIAGNOSIS — N951 Menopausal and female climacteric states: Secondary | ICD-10-CM

## 2010-12-29 DIAGNOSIS — J330 Polyp of nasal cavity: Secondary | ICD-10-CM

## 2010-12-29 DIAGNOSIS — S161XXA Strain of muscle, fascia and tendon at neck level, initial encounter: Secondary | ICD-10-CM

## 2010-12-29 DIAGNOSIS — K219 Gastro-esophageal reflux disease without esophagitis: Secondary | ICD-10-CM

## 2010-12-29 DIAGNOSIS — E119 Type 2 diabetes mellitus without complications: Secondary | ICD-10-CM

## 2010-12-29 DIAGNOSIS — R002 Palpitations: Principal | ICD-10-CM

## 2010-12-29 DIAGNOSIS — R42 Dizziness and giddiness: Secondary | ICD-10-CM

## 2010-12-29 DIAGNOSIS — E785 Hyperlipidemia, unspecified: Secondary | ICD-10-CM

## 2010-12-29 DIAGNOSIS — Z79899 Other long term (current) drug therapy: Secondary | ICD-10-CM

## 2010-12-29 DIAGNOSIS — J019 Acute sinusitis, unspecified: Secondary | ICD-10-CM

## 2010-12-29 DIAGNOSIS — N410 Acute prostatitis: Secondary | ICD-10-CM

## 2010-12-29 DIAGNOSIS — Z7982 Long term (current) use of aspirin: Secondary | ICD-10-CM

## 2010-12-29 DIAGNOSIS — Z23 Encounter for immunization: Secondary | ICD-10-CM

## 2010-12-29 DIAGNOSIS — I10 Essential (primary) hypertension: Secondary | ICD-10-CM

## 2010-12-29 DIAGNOSIS — Z87891 Personal history of nicotine dependence: Secondary | ICD-10-CM

## 2010-12-29 DIAGNOSIS — IMO0001 Reserved for inherently not codable concepts without codable children: Secondary | ICD-10-CM

## 2010-12-29 DIAGNOSIS — R000 Tachycardia, unspecified: Secondary | ICD-10-CM

## 2010-12-29 LAB — DIFFERENTIAL
Basophils Absolute: 0 10*3/uL (ref 0.0–0.1)
Basophils Relative: 1 % (ref 0–1)
Eosinophils Absolute: 0.3 10*3/uL (ref 0.0–0.7)
Monocytes Absolute: 1 10*3/uL (ref 0.1–1.0)
Monocytes Relative: 12 % (ref 3–12)
Neutrophils Relative %: 53 % (ref 43–77)

## 2010-12-29 LAB — CBC
Hemoglobin: 15.6 g/dL (ref 13.0–17.0)
MCH: 31.8 pg (ref 26.0–34.0)
MCHC: 35.9 g/dL (ref 30.0–36.0)
RDW: 12.9 % (ref 11.5–15.5)

## 2010-12-29 LAB — POCT I-STAT TROPONIN I

## 2010-12-29 NOTE — Telephone Encounter (Signed)
If symptoms con't ---come in to be seen

## 2010-12-29 NOTE — Telephone Encounter (Signed)
Discuss with patient  

## 2010-12-29 NOTE — Telephone Encounter (Signed)
Blood pressures are good ----symptoms may be related to bld sugars---what are fasting bld sugars running?

## 2010-12-29 NOTE — Telephone Encounter (Signed)
Fasting BS today 124 and have been running around 115-135 fasting

## 2010-12-29 NOTE — Telephone Encounter (Signed)
Pt called back this AM to report that BP monitor that he was using was broke so he purchased another one last night and BP have been running today 122/70 and fasting BS 124. BP average 139/76 on yesterday and today 129/73. Pt notes that symptoms started before he was changed to janumet. Pt notes that yesterday was the first day that he took the janumet.

## 2010-12-29 NOTE — ED Notes (Signed)
PT. REPORTS PALPITATIONS THIS EVENING , DENIES CHEST PAIN , NO SOB , NO COUGH , DENIES NAUSEA OR DIAPHORESIS.  HR= 120'S AT TRIAGE IRREGULAR.

## 2010-12-29 NOTE — Telephone Encounter (Signed)
Left message to call office

## 2010-12-30 ENCOUNTER — Encounter (HOSPITAL_COMMUNITY): Payer: Self-pay | Admitting: Internal Medicine

## 2010-12-30 DIAGNOSIS — M7989 Other specified soft tissue disorders: Secondary | ICD-10-CM

## 2010-12-30 DIAGNOSIS — R002 Palpitations: Principal | ICD-10-CM | POA: Diagnosis present

## 2010-12-30 LAB — URINALYSIS, ROUTINE W REFLEX MICROSCOPIC
Bilirubin Urine: NEGATIVE
Hgb urine dipstick: NEGATIVE
Specific Gravity, Urine: 1.009 (ref 1.005–1.030)
pH: 7 (ref 5.0–8.0)

## 2010-12-30 LAB — COMPREHENSIVE METABOLIC PANEL
Albumin: 4 g/dL (ref 3.5–5.2)
Alkaline Phosphatase: 48 U/L (ref 39–117)
BUN: 14 mg/dL (ref 6–23)
Calcium: 9 mg/dL (ref 8.4–10.5)
Creatinine, Ser: 0.87 mg/dL (ref 0.50–1.35)
GFR calc Af Amer: >90 mL/min (ref >90)
Glucose, Bld: 140 mg/dL — ABNORMAL HIGH (ref 70–99)
Potassium: 3.9 mEq/L (ref 3.5–5.1)
Total Protein: 7.1 g/dL (ref 6.0–8.3)

## 2010-12-30 LAB — T4, FREE: Free T4: 1.42 ng/dL (ref 0.80–1.80)

## 2010-12-30 LAB — CBC
HCT: 44.2 % (ref 39.0–52.0)
MCV: 88.8 fL (ref 78.0–100.0)
Platelets: 188 10*3/uL (ref 150–400)
RBC: 4.98 MIL/uL (ref 4.22–5.81)
RDW: 12.9 % (ref 11.5–15.5)
WBC: 11.2 10*3/uL — ABNORMAL HIGH (ref 4.0–10.5)

## 2010-12-30 LAB — T4: T4, Total: 9.7 ug/dL (ref 5.0–12.5)

## 2010-12-30 LAB — RAPID URINE DRUG SCREEN, HOSP PERFORMED
Amphetamines: NOT DETECTED
Benzodiazepines: NOT DETECTED
Opiates: NOT DETECTED

## 2010-12-30 LAB — BASIC METABOLIC PANEL
BUN: 15 mg/dL (ref 6–23)
Creatinine, Ser: 1.09 mg/dL (ref 0.50–1.35)
GFR calc non Af Amer: 69 mL/min — ABNORMAL LOW (ref >90)
Glucose, Bld: 149 mg/dL — ABNORMAL HIGH (ref 70–99)
Potassium: 4.1 mEq/L (ref 3.5–5.1)

## 2010-12-30 LAB — T3, FREE: T3, Free: 3.3 pg/mL (ref 2.3–4.2)

## 2010-12-30 LAB — CARDIAC PANEL(CRET KIN+CKTOT+MB+TROPI)
CK, MB: 2.9 ng/mL (ref 0.3–4.0)
CK, MB: 2.9 ng/mL (ref 0.3–4.0)
CK, MB: 2.5 ng/mL (ref 0.3–4.0)
Total CK: 98 U/L (ref 7–232)
Troponin I: <0.3 ng/mL (ref <0.30)
Troponin I: <0.3 ng/mL (ref <0.30)

## 2010-12-30 LAB — TSH: TSH: 0.979 u[IU]/mL (ref 0.350–4.500)

## 2010-12-30 MED ORDER — NIACIN-SIMVASTATIN ER 1000-40 MG PO TB24: 1.0000 | ORAL_TABLET | Freq: Every day | ORAL | Status: DC

## 2010-12-30 MED ORDER — LISINOPRIL 20 MG PO TABS
20.0000 mg | ORAL_TABLET | Freq: Every day | ORAL | Status: DC
  Administered 2010-12-30: 20 mg via ORAL
  Filled 2010-12-30 (×2): qty 1

## 2010-12-30 MED ORDER — ROSUVASTATIN CALCIUM 10 MG PO TABS
10.0000 mg | ORAL_TABLET | Freq: Every day | ORAL | Status: DC
  Administered 2010-12-30: 10 mg via ORAL
  Filled 2010-12-30 (×2): qty 1

## 2010-12-30 MED ORDER — CYCLOBENZAPRINE HCL 10 MG PO TABS: 10.0000 mg | ORAL_TABLET | Freq: Three times a day (TID) | ORAL | Status: DC | PRN

## 2010-12-30 MED ORDER — AMLODIPINE BESYLATE 10 MG PO TABS
10.0000 mg | ORAL_TABLET | Freq: Every day | ORAL | Status: DC
  Administered 2010-12-30 – 2010-12-31 (×2): 10 mg via ORAL
  Filled 2010-12-30 (×2): qty 1

## 2010-12-30 MED ORDER — ALPRAZOLAM 0.25 MG PO TABS: 0.2500 mg | ORAL_TABLET | Freq: Three times a day (TID) | ORAL | Status: DC | PRN

## 2010-12-30 MED ORDER — CLONAZEPAM 0.5 MG PO TABS
0.5000 mg | ORAL_TABLET | Freq: Two times a day (BID) | ORAL | Status: DC
  Administered 2010-12-30 – 2010-12-31 (×3): 0.5 mg via ORAL
  Filled 2010-12-30 (×3): qty 1

## 2010-12-30 MED ORDER — LORATADINE 10 MG PO TABS
10.0000 mg | ORAL_TABLET | Freq: Every day | ORAL | Status: DC
  Administered 2010-12-31: 10 mg via ORAL
  Filled 2010-12-30 (×2): qty 1

## 2010-12-30 MED ORDER — HYDROCODONE-ACETAMINOPHEN 5-325 MG PO TABS: 1.0000 | ORAL_TABLET | Freq: Four times a day (QID) | ORAL | Status: DC | PRN

## 2010-12-30 MED ORDER — PANTOPRAZOLE SODIUM 40 MG PO TBEC
40.0000 mg | DELAYED_RELEASE_TABLET | Freq: Every day | ORAL | Status: DC
  Administered 2010-12-30 – 2010-12-31 (×2): 40 mg via ORAL
  Filled 2010-12-30 (×2): qty 1

## 2010-12-30 MED ORDER — QUINAPRIL HCL 10 MG PO TABS
20.0000 mg | ORAL_TABLET | Freq: Every day | ORAL | Status: DC
  Filled 2010-12-30: qty 2

## 2010-12-30 MED ORDER — FLUTICASONE PROPIONATE 50 MCG/ACT NA SUSP
2.0000 | Freq: Every day | NASAL | Status: DC
  Filled 2010-12-30: qty 16

## 2010-12-30 MED ORDER — ZOLPIDEM TARTRATE 5 MG PO TABS: 10.0000 mg | ORAL_TABLET | Freq: Every evening | ORAL | Status: DC | PRN

## 2010-12-30 MED ORDER — ONDANSETRON HCL 4 MG PO TABS: 4.0000 mg | ORAL_TABLET | Freq: Four times a day (QID) | ORAL | Status: DC | PRN

## 2010-12-30 MED ORDER — INSULIN ASPART 100 UNIT/ML ~~LOC~~ SOLN
0.0000 [IU] | Freq: Three times a day (TID) | SUBCUTANEOUS | Status: DC
  Administered 2010-12-30 – 2010-12-31 (×2): 1 [IU] via SUBCUTANEOUS
  Administered 2010-12-31: 2 [IU] via SUBCUTANEOUS
  Filled 2010-12-30: qty 3

## 2010-12-30 MED ORDER — ENOXAPARIN SODIUM 40 MG/0.4ML ~~LOC~~ SOLN
40.0000 mg | SUBCUTANEOUS | Status: DC
  Administered 2010-12-30 – 2010-12-31 (×2): 40 mg via SUBCUTANEOUS
  Filled 2010-12-30 (×2): qty 0.4

## 2010-12-30 MED ORDER — SODIUM CHLORIDE 0.9 % IV SOLN
INTRAVENOUS | Status: DC
  Administered 2010-12-30: 07:00:00 via INTRAVENOUS

## 2010-12-30 MED ORDER — METOPROLOL TARTRATE 25 MG PO TABS
25.0000 mg | ORAL_TABLET | Freq: Two times a day (BID) | ORAL | Status: DC
  Administered 2010-12-30 (×2): 25 mg via ORAL
  Filled 2010-12-30 (×5): qty 1

## 2010-12-30 MED ORDER — ACETAMINOPHEN 325 MG PO TABS
650.0000 mg | ORAL_TABLET | Freq: Four times a day (QID) | ORAL | Status: DC | PRN
  Administered 2010-12-30 – 2010-12-31 (×2): 650 mg via ORAL
  Filled 2010-12-30 (×2): qty 2

## 2010-12-30 MED ORDER — GLIMEPIRIDE 4 MG PO TABS
4.0000 mg | ORAL_TABLET | Freq: Every day | ORAL | Status: DC
  Administered 2010-12-30 – 2010-12-31 (×2): 4 mg via ORAL
  Filled 2010-12-30 (×3): qty 1

## 2010-12-30 MED ORDER — NIACIN ER 500 MG PO CPCR
1000.0000 mg | ORAL_CAPSULE | Freq: Every day | ORAL | Status: DC
  Administered 2010-12-30: 1000 mg via ORAL
  Filled 2010-12-30 (×2): qty 2

## 2010-12-30 MED ORDER — ONDANSETRON HCL 4 MG/2ML IJ SOLN: 4.0000 mg | Freq: Four times a day (QID) | INTRAMUSCULAR | Status: DC | PRN

## 2010-12-30 MED ORDER — MINOXIDIL 2.5 MG PO TABS: 2.5000 mg | ORAL_TABLET | Freq: Every day | ORAL | Status: DC

## 2010-12-30 MED ORDER — ACETAMINOPHEN 650 MG RE SUPP: 650.0000 mg | Freq: Four times a day (QID) | RECTAL | Status: DC | PRN

## 2010-12-30 NOTE — ED Provider Notes (Signed)
History     CSN: [id number] Arrival date & time: 12/29/2010 10:59 PM   First MD Initiated Contact with Patient 12/30/10 0103      Chief Complaint  Patient presents with  . Palpitations    (Consider location/radiation/quality/duration/timing/severity/associated sxs/prior treatment) HPI Comments: 65 year old male with a history of hypertension, diabetes who presents with "uneasiness" and palpitations. According to paperwork which he has brought with him from a recent admission to the hospital at the [facility] he recently was found to have a low potassium of 3.1, blood sugar around 250 and a near syncope. This completely resolved and was related to being out fishing all day long while at the beach. He states that since coming home he has done very well but tonight felt an uneasy feeling in his stomach like his heart was racing. He denies shortness of breath, chest pain, back pain, headache. He denies swelling in his legs, recent travel trauma or surgery. Symptoms are mild, persistent, nothing makes better or worse. Symptoms were gradual in onset this evening and when he took his blood pressure at home it was 170 systolic, heart rate 101. He takes several different medications for blood pressure but is not on a beta blocker  The history is provided by the patient and a relative.    Past Medical History  Diagnosis Date  . 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 Diabetes mellitus   . Hyperlipidemia   . Hypertension     Past Surgical History  Procedure Date  . Cholecystectomy     No family history on file.  History  Substance Use Topics  . Smoking status: Never Smoker   . Smokeless tobacco: Never Used  . Alcohol Use: Yes      Review of Systems  All other systems reviewed and are negative.    Allergies  Cephalexin  Home Medications   Current Outpatient Rx  Name Route Sig Dispense Refill  . ALPRAZOLAM 0.25 MG PO TABS Oral Take by mouth 3 (three) times daily as  needed. Take 1 tablet by mouth 30 minutes before procedure. For Anxiety     . AMLODIPINE BESYLATE 10 MG PO TABS Oral Take 1 tablet (10 mg total) by mouth daily. 90 tablet 0  . VITAMIN D 2000 UNITS PO TABS Oral Take 2,000 Units by mouth daily.      . CYCLOBENZAPRINE HCL 10 MG PO TABS Oral Take 1 tablet (10 mg total) by mouth every 8 (eight) hours as needed for muscle spasms. 30 tablet 1  . ESOMEPRAZOLE MAGNESIUM 40 MG PO CPDR Oral Take 1 capsule (40 mg total) by mouth daily. 90 capsule 0  . FEXOFENADINE HCL 180 MG PO TABS Oral Take 180 mg by mouth daily.      Marland Kitchen FLUTICASONE PROPIONATE 50 MCG/ACT NA SUSP Nasal Place 2 sprays into the nose daily. 16 g 5  . GLIMEPIRIDE 4 MG PO TABS Oral Take 1 tablet (4 mg total) by mouth daily. 90 tablet 0  . HYDROCODONE-ACETAMINOPHEN 5-325 MG PO TABS Oral Take 1 tablet by mouth every 6 (six) hours as needed. For pain    . MELOXICAM 15 MG PO TABS Oral Take 1 tablet (15 mg total) by mouth daily. 30 tablet 2  . NIACIN-SIMVASTATIN 1000-40 MG PO TB24 Oral Take 1 tablet by mouth at bedtime. 90 tablet 1  . QUINAPRIL HCL 20 MG PO TABS Oral Take 1 tablet (20 mg total) by mouth at bedtime. 90 tablet 0  .  SITAGLIPTIN-METFORMIN HCL 50-1000 MG PO TABS Oral Take 1 tablet by mouth 2 (two) times daily with a meal.      . SPIRONOLACTONE 25 MG PO TABS Oral Take 1 tablet (25 mg total) by mouth daily. 90 tablet 0    Ref # [id number]  . ZOLPIDEM TARTRATE 10 MG PO TABS Oral Take 10 mg by mouth at bedtime as needed. For sleep      BP 143/77  Pulse 115  Temp(Src) 97.3 F (36.3 C) (Oral)  Resp 18  SpO2 96%  Physical Exam  Nursing note and vitals reviewed. Constitutional: He appears well-developed and well-nourished. No distress.  HENT:  Head: Normocephalic and atraumatic.  Mouth/Throat: Oropharynx is clear and moist. No oropharyngeal exudate.  Eyes: Conjunctivae and EOM are normal. Pupils are equal, round, and reactive to light. Right eye exhibits no discharge. Left eye exhibits  no discharge. No scleral icterus.  Neck: Normal range of motion. Neck supple. No JVD present. No thyromegaly present.  Cardiovascular: Regular rhythm, normal heart sounds and intact distal pulses.  Exam reveals no gallop and no friction rub.   No murmur heard.      Tachycardia of 115  Pulmonary/Chest: Effort normal and breath sounds normal. No respiratory distress. He has no wheezes. He has no rales.  Abdominal: Soft. Bowel sounds are normal. He exhibits no distension and no mass. There is no tenderness.  Musculoskeletal: Normal range of motion. He exhibits edema (Scant bilateral lower extremity edema). He exhibits no tenderness.  Lymphadenopathy:    He has no cervical adenopathy.  Neurological: He is alert. Coordination normal.  Skin: Skin is warm and dry. No rash noted. No erythema.  Psychiatric: He has a normal mood and affect. His behavior is normal.    ED Course  Procedures (including critical care time)  Labs Reviewed  BASIC METABOLIC PANEL - Abnormal; Notable for the following:    Sodium 132 (*)    Chloride 94 (*)    Glucose, Bld 149 (*)    GFR calc non Af Amer 69 (*)    GFR calc Af Amer 80 (*)    All other components within normal limits  CBC  DIFFERENTIAL  POCT I-STAT TROPONIN I  D-DIMER, QUANTITATIVE  URINALYSIS, ROUTINE W REFLEX MICROSCOPIC  TSH  T4  URINE RAPID DRUG SCREEN (HOSP PERFORMED)   Dg Chest 2 View  12/30/2010  *RADIOLOGY REPORT*  Clinical Data: Hypertension.  Tachycardia.  Diabetes.  CHEST - 2 VIEW 12/29/2010:  Comparison: Two-view chest x-ray 12/16/2009 [facility].  Findings: Cardiac silhouette normal in size.  Thoracic aorta mildly tortuous, unchanged.  Hilar and mediastinal contours otherwise unremarkable.  Lungs clear.  Bronchovascular markings normal.  No pleural effusions.  Degenerative changes involving the thoracic spine.  No significant interval change.  IMPRESSION: No acute cardiopulmonary disease.  Stable examination.  Original Report  Authenticated By: Arnell Sieving, M.D.     1. Tachycardia   2. Light-headedness       MDM  Overall patient is well appearing though he is tachycardic to 115-120. He states that he has been using Xanax since his MRI of the cervical spine which showed some mild stenosis. He took one of these tonight without any relief. EKG shows no signs of ischemia though tachycardia is confirmed. Lab work to ensue including d-dimer to rule out pulmonary embolism as this was not addressed as prior admission.  ED ECG REPORT   Date: 12/30/2010   Rate: 122  Rhythm: sinus tachycardia  QRS  Axis: normal  Intervals: normal  ST/T Wave abnormalities: nonspecific ST/T changes  Conduction Disutrbances:none  Narrative Interpretation:   Old EKG Reviewed: none available   Lab workup unremarkable, the patient remains tachycardic at around 120. Have discussed with the hospitalist who will admit to the hospital. TSH pending, drug screen pending     Vida Roller, MD 12/30/10 [phone]

## 2010-12-30 NOTE — Progress Notes (Signed)
Bruce Romero EAV:[id number] is a 65 y.o. male,  Outpatient Primary MD for the patient is Loreen Freud, DO, DO Chief Complaint  Patient presents with  . Palpitations      12/30/2010   Subjective:   Bruce Romero today has, No headache, No chest pain, No abdominal pain - No Nausea, No new weakness tingling or numbness, No Cough - SOB.   Objective:   Filed Vitals:   12/30/10 0219 12/30/10 0229 12/30/10 0514 12/30/10 0534  BP: 154/75 143/77 150/87   Pulse: 112 115    Temp:  97.3 F (36.3 C) 97.4 F (36.3 C)   TempSrc:  Oral Oral   Resp: 11 18 18    Height:    5\' 10"  (1.778 m)  Weight:   99.519 kg (219 lb 6.4 oz)   SpO2: 96% 96% 93%    Wt Readings from Last 3 Encounters:  12/30/10 99.519 kg (219 lb 6.4 oz)  12/20/10 102.604 kg (226 lb 3.2 oz)  11/25/10 101.606 kg (224 lb)    Intake/Output Summary (Last 24 hours) at 12/30/10 0931 Last data filed at 12/30/10 0820  Gross per 24 hour  Intake    120 ml  Output    400 ml  Net   -280 ml   Exam Awake Alert, Oriented *3, No new F.N deficits, Normal affect [place].AT,PERRAL Supple Neck,No JVD, No cervical lymphadenopathy appriciated.  Symmetrical Chest wall movement, Good air movement bilaterally, CTAB RRR,No Gallops,Rubs or new Murmurs, No Parasternal Heave +ve B.Sounds, Abd Soft, Non tender, No organomegaly appriciated, No rebound -guarding or rigidity. No Cyanosis, Clubbing or edema, No new Rash or bruise    Data Review  CBC  Lab 12/30/10 0600 12/29/10 2309  WBC 11.2* 8.4  HGB 15.3 15.6  HCT 44.2 43.4  PLT 188 196  MCV 88.8 88.6  MCH 30.7 31.8  MCHC 34.6 35.9  RDW 12.9 12.9  LYMPHSABS -- 2.6  MONOABS -- 1.0  EOSABS -- 0.3  BASOSABS -- 0.0  BANDABS -- --    Lab 12/30/10 0600 12/29/10 2309  NA 135 132*  K 3.9 4.1  CL 98 94*  CO2 26 27  GLUCOSE 140* 149*  BUN 14 15  CREATININE 0.87 1.09  CALCIUM 9.0 9.3  MG 1.9 --   No results found for this basename: INR:5,PROTIME:5 in the last 168  hours Cardiac markers:  Lab 12/30/10 0630  CKMB 2.9  TROPONINI <0.30  MYOGLOBIN --   No results found for this basename: POCBNP:3 in the last 168 hours No results found for this or any previous visit (from the past 240 hour(s)).  Radiology Reports Dg Chest 2 View  12/30/2010  *RADIOLOGY REPORT*  Clinical Data: Hypertension.  Tachycardia.  Diabetes.  CHEST - 2 VIEW 12/29/2010:  Comparison: Two-view chest x-ray 12/16/2009 [facility].  Findings: Cardiac silhouette normal in size.  Thoracic aorta mildly tortuous, unchanged.  Hilar and mediastinal contours otherwise unremarkable.  Lungs clear.  Bronchovascular markings normal.  No pleural effusions.  Degenerative changes involving the thoracic spine.  No significant interval change.  IMPRESSION: No acute cardiopulmonary disease.  Stable examination.  Original Report Authenticated By: Arnell Sieving, M.D.   Mr Cervical Spine Wo Contrast  12/10/2010  *RADIOLOGY REPORT*  Clinical Data: Neck pain and right shoulder pain.  MRI CERVICAL SPINE WITHOUT CONTRAST  Technique:  Multiplanar and multiecho pulse sequences of the cervical spine, to include the craniocervical junction and cervicothoracic junction, were obtained according to standard protocol without intravenous contrast.  Comparison:  None  Findings: There is no abnormality at the foramen magnum, C1-2 years C2-3.  At C3-4, there is minimal bulging of the disc and minimal facet degeneration.  No stenosis of the canal or foramina.  At C4-5, there is facet arthropathy on the right that could be a cause of pain.  There is 1 mm of anterolisthesis.  There is mild bulging of the disc.  Foraminal narrowing on the right could effect the C5 nerve root.  The C5-6 level is normal.  At C6-7, the disc bulges minimally.  The canal and foramina are widely patent.  The C7-T1 level is normal.  No upper thoracic abnormality.  No abnormal cord signal.  IMPRESSION: The significant finding on this case is probably  facet arthropathy at C4-5 on the right.  This allows anterolisthesis of 1 mm.  There is mild bulging of the disc.  There is foraminal stenosis on the right that could effect the exiting C5 nerve root.  In addition, the facet arthropathy itself could be a cause of pain.  Original Report Authenticated By: Thomasenia Sales, M.D.   Scheduled Meds:   . amLODipine  10 mg Oral Daily  . fluticasone  2 spray Each Nare Daily  . glimepiride  4 mg Oral QAC breakfast  . lisinopril  20 mg Oral Daily  . loratadine  10 mg Oral Daily  . niacin  1,000 mg Oral QHS   And  . rosuvastatin  10 mg Oral QHS  . pantoprazole  40 mg Oral Q1200  . DISCONTD: minoxidil  2.5 mg Oral Daily  . DISCONTD: Niacin-Simvastatin  1 tablet Oral QHS  . DISCONTD: quinapril  20 mg Oral QHS   Continuous Infusions:   . sodium chloride 75 mL/hr at 12/30/10 0701   PRN Meds:.acetaminophen, acetaminophen, ALPRAZolam, cyclobenzaprine, HYDROcodone-acetaminophen, ondansetron (ZOFRAN) IV, ondansetron, zolpidem  Assessment & Plan   1)Palpitations with sinus tachycarida - EKG NSR, TSH pending, Echo and Leg [place] being done today, pain free, no SOB, -ve D dimer, monitor results, little anxious so add klonipin. Tele. Cycle enzymes.   2)Diabetes mellitus type 2 - home meds and outpt follow, add ISS as needed  CBG (last 3)   Basename 12/30/10 0520  GLUCAP 150*     3)Hypertension - stable on Norvasc add low dose B blocker for better control.   DVT Prophylaxis  Lovenox   See all Orders from today for further details

## 2010-12-30 NOTE — Progress Notes (Signed)
*  PRELIMINARY RESULTS* Echocardiogram 2D Echocardiogram has been performed.  Clide Deutscher RDCS 12/30/2010, 9:26 AM

## 2010-12-30 NOTE — H&P (Signed)
Bruce Romero is an 65 y.o. male.   Chief Complaint: Palpitations HPI: 65 year old male with history of dm2 and hypertension has been experiencing palpitations since last night 8 pm. He had a similar episode [place] on [place] 9 2 weeks ago. At that time he felt dizzy and had gone to the local ER. He was given iv fluids had ct head and potassium was replaced. CT head did not show anything acute. Since then he has been getting dizzy when he changes position. Denies any chest pain, shortness of breath, fever, chills, cough, or focal deficits, headache or dysuria or diarrhea. In the ER over here at [place] patient had EKG which shows sinus tachy cardia. D DImer is negative. Due to his persistent sinus tachycardia with rate around 120's patient has been admitted for further work up.  Past Medical History  Diagnosis Date  . 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 Diabetes mellitus   . Hyperlipidemia   . Hypertension     Past Surgical History  Procedure Date  . Cholecystectomy     Family History  Problem Relation Age of Onset  . Stroke Mother   . Pulmonary embolism Father    Social History:  reports that he has quit smoking. He has never used smokeless tobacco. He reports that he drinks alcohol. He reports that he does not use illicit drugs.  Allergies:  Allergies  Allergen Reactions  . Cephalexin     REACTION: pt is not allergic to penicillin    No current facility-administered medications on file as of 12/29/2010.   Medications Prior to Admission  Medication Sig Dispense Refill  . ALPRAZolam (XANAX) 0.25 MG tablet Take by mouth 3 (three) times daily as needed. Take 1 tablet by mouth 30 minutes before procedure. For Anxiety       . amLODipine (NORVASC) 10 MG tablet Take 1 tablet (10 mg total) by mouth daily.  90 tablet  0  . Cholecalciferol (VITAMIN D) 2000 UNITS tablet Take 2,000 Units by mouth daily.        . cyclobenzaprine (FLEXERIL) 10  MG tablet Take 1 tablet (10 mg total) by mouth every 8 (eight) hours as needed for muscle spasms.  30 tablet  1  . esomeprazole (NEXIUM) 40 MG capsule Take 1 capsule (40 mg total) by mouth daily.  90 capsule  0  . fexofenadine (ALLEGRA) 180 MG tablet Take 180 mg by mouth daily.        . fluticasone (FLONASE) 50 MCG/ACT nasal spray Place 2 sprays into the nose daily.  16 g  5  . glimepiride (AMARYL) 4 MG tablet Take 1 tablet (4 mg total) by mouth daily.  90 tablet  0  . HYDROcodone-acetaminophen (NORCO) 5-325 MG per tablet Take 1 tablet by mouth every 6 (six) hours as needed. For pain      . meloxicam (MOBIC) 15 MG tablet Take 1 tablet (15 mg total) by mouth daily.  30 tablet  2  . Niacin-Simvastatin [facility]) 1000-40 MG TB24 Take 1 tablet by mouth at bedtime.  90 tablet  1  . quinapril (ACCUPRIL) 20 MG tablet Take 1 tablet (20 mg total) by mouth at bedtime.  90 tablet  0  . spironolactone (ALDACTONE) 25 MG tablet Take 1 tablet (25 mg total) by mouth daily.  90 tablet  0  . zolpidem (AMBIEN) 10 MG tablet Take 10 mg by mouth at bedtime  as needed. For sleep        Results for orders placed during the hospital encounter of 12/29/10 (from the past 48 hour(s))  BASIC METABOLIC PANEL     Status: Abnormal   Collection Time   12/29/10 11:09 PM      Component Value Range Comment   Sodium 132 (*) 135 - 145 (mEq/L)    Potassium 4.1  3.5 - 5.1 (mEq/L)    Chloride 94 (*) 96 - 112 (mEq/L)    CO2 27  19 - 32 (mEq/L)    Glucose, Bld 149 (*) 70 - 99 (mg/dL)    BUN 15  6 - 23 (mg/dL)    Creatinine, Ser 4.54  0.50 - 1.35 (mg/dL)    Calcium 9.3  8.4 - 10.5 (mg/dL)    GFR calc non Af Amer 69 (*) >90 (mL/min)    GFR calc Af Amer 80 (*) >90 (mL/min)   CBC     Status: Normal   Collection Time   12/29/10 11:09 PM      Component Value Range Comment   WBC 8.4  4.0 - 10.5 (K/uL)    RBC 4.90  4.22 - 5.81 (MIL/uL)    Hemoglobin 15.6  13.0 - 17.0 (g/dL)    HCT 09.8  11.9 - 14.7 (%)    MCV 88.6  78.0 - 100.0 (fL)     MCH 31.8  26.0 - 34.0 (pg)    MCHC 35.9  30.0 - 36.0 (g/dL)    RDW 82.9  56.2 - 13.0 (%)    Platelets 196  150 - 400 (K/uL)   DIFFERENTIAL     Status: Normal   Collection Time   12/29/10 11:09 PM      Component Value Range Comment   Neutrophils Relative 53  43 - 77 (%)    Neutro Abs 4.5  1.7 - 7.7 (K/uL)    Lymphocytes Relative 31  12 - 46 (%)    Lymphs Abs 2.6  0.7 - 4.0 (K/uL)    Monocytes Relative 12  3 - 12 (%)    Monocytes Absolute 1.0  0.1 - 1.0 (K/uL)    Eosinophils Relative 3  0 - 5 (%)    Eosinophils Absolute 0.3  0.0 - 0.7 (K/uL)    Basophils Relative 1  0 - 1 (%)    Basophils Absolute 0.0  0.0 - 0.1 (K/uL)   POCT I-STAT TROPONIN I     Status: Normal   Collection Time   12/29/10 11:41 PM      Component Value Range Comment   Troponin i, poc 0.00  0.00 - 0.08 (ng/mL)    Comment 3            D-DIMER, QUANTITATIVE     Status: Normal   Collection Time   12/30/10  1:29 AM      Component Value Range Comment   D-Dimer, Quant 0.27  0.00 - 0.48 (ug/mL-FEU)   URINALYSIS, ROUTINE W REFLEX MICROSCOPIC     Status: Normal   Collection Time   12/30/10  2:19 AM      Component Value Range Comment   Color, Urine YELLOW  YELLOW     Appearance CLEAR  CLEAR     Specific Gravity, Urine 1.009  1.005 - 1.030     pH 7.0  5.0 - 8.0     Glucose, UA NEGATIVE  NEGATIVE (mg/dL)    Hgb urine dipstick NEGATIVE  NEGATIVE     Bilirubin Urine NEGATIVE  NEGATIVE     Ketones, ur NEGATIVE  NEGATIVE (mg/dL)    Protein, ur NEGATIVE  NEGATIVE (mg/dL)    Urobilinogen, UA 0.2  0.0 - 1.0 (mg/dL)    Nitrite NEGATIVE  NEGATIVE     Leukocytes, UA NEGATIVE  NEGATIVE  MICROSCOPIC NOT DONE ON URINES WITH NEGATIVE PROTEIN, BLOOD, LEUKOCYTES, NITRITE, OR GLUCOSE <1000 mg/dL.  URINE RAPID DRUG SCREEN (HOSP PERFORMED)     Status: Normal   Collection Time   12/30/10  2:19 AM      Component Value Range Comment   Opiates NONE DETECTED  NONE DETECTED     Cocaine NONE DETECTED  NONE DETECTED     Benzodiazepines  NONE DETECTED  NONE DETECTED     Amphetamines NONE DETECTED  NONE DETECTED     Tetrahydrocannabinol NONE DETECTED  NONE DETECTED     Barbiturates NONE DETECTED  NONE DETECTED     Dg Chest 2 View  12/30/2010  *RADIOLOGY REPORT*  Clinical Data: Hypertension.  Tachycardia.  Diabetes.  CHEST - 2 VIEW 12/29/2010:  Comparison: Two-view chest x-ray 12/16/2009 [facility].  Findings: Cardiac silhouette normal in size.  Thoracic aorta mildly tortuous, unchanged.  Hilar and mediastinal contours otherwise unremarkable.  Lungs clear.  Bronchovascular markings normal.  No pleural effusions.  Degenerative changes involving the thoracic spine.  No significant interval change.  IMPRESSION: No acute cardiopulmonary disease.  Stable examination.  Original Report Authenticated By: Arnell Sieving, M.D.    Review of Systems  Constitutional: Negative.   HENT: Negative.   Eyes: Negative.   Respiratory: Negative.   Cardiovascular: Positive for palpitations.  Gastrointestinal: Negative.   Genitourinary: Negative.   Musculoskeletal: Negative.   Skin: Negative.   Neurological: Positive for dizziness.  Endo/Heme/Allergies: Negative.   Psychiatric/Behavioral: Negative.     Blood pressure 143/77, pulse 115, temperature 97.3 F (36.3 C), temperature source Oral, resp. rate 18, SpO2 96.00%. Physical Exam  Constitutional: He is oriented to person, place, and time. He appears well-developed and well-nourished.  HENT:  Head: Normocephalic and atraumatic.  Right Ear: External ear normal.  Left Ear: External ear normal.  Nose: Nose normal.  Mouth/Throat: Oropharynx is clear and moist.  Eyes: Conjunctivae and EOM are normal. Pupils are equal, round, and reactive to light.  Neck: Normal range of motion. Neck supple.  Cardiovascular: Regular rhythm, normal heart sounds and intact distal pulses.   Respiratory: Effort normal and breath sounds normal.  GI: Soft. Bowel sounds are normal. He exhibits no  distension. There is no tenderness. There is no rebound.  Musculoskeletal: Normal range of motion.  Neurological: He is alert and oriented to person, place, and time. He has normal reflexes. No cranial nerve deficit. He exhibits normal muscle tone. Coordination normal.  Skin: Skin is warm and dry.  Psychiatric: His behavior is normal.     Assessment/Plan 1)Palpitations with sinus tachycarida 2)Diabetes mellitus type 2 3)Hypertension  Plan:  Admit to telemetry Will check -  thyroid function tests to rule out hyperthyroidism.                   - 2d echo                   - urine drug screen                   - doppler of the lower extremities to rule out dvt(I don't see any obvious swelling in his calf though er physician  Felt his  right calf was mildly swollen. Based on these tests we will plan further recommendations.  Eduard Clos. 12/30/2010, 4:23 AM

## 2010-12-30 NOTE — Progress Notes (Signed)
Bilateral lower extremity venous duplex completed. No evidence of DVT, superficial thrombosis. or Baker's cystMilta Deiters, [place] D 12/30/2010, 8:28 PM

## 2010-12-30 NOTE — ED Notes (Signed)
Patient states that he noticed through out the evening he felt like his heart was racing and took his BP

## 2010-12-31 ENCOUNTER — Other Ambulatory Visit: Payer: Self-pay

## 2010-12-31 ENCOUNTER — Encounter (HOSPITAL_COMMUNITY): Payer: Self-pay | Admitting: *Deleted

## 2010-12-31 DIAGNOSIS — R42 Dizziness and giddiness: Secondary | ICD-10-CM

## 2010-12-31 LAB — BASIC METABOLIC PANEL
BUN: 14 mg/dL (ref 6–23)
Calcium: 8.8 mg/dL (ref 8.4–10.5)
GFR calc non Af Amer: 88 mL/min — ABNORMAL LOW (ref >90)
Glucose, Bld: 124 mg/dL — ABNORMAL HIGH (ref 70–99)

## 2010-12-31 LAB — GLUCOSE, CAPILLARY: Glucose-Capillary: 167 mg/dL — ABNORMAL HIGH (ref 70–99)

## 2010-12-31 LAB — CBC
HCT: 42.1 % (ref 39.0–52.0)
Hemoglobin: 14.6 g/dL (ref 13.0–17.0)
MCH: 31.2 pg (ref 26.0–34.0)
MCHC: 34.7 g/dL (ref 30.0–36.0)
MCV: 90 fL (ref 78.0–100.0)

## 2010-12-31 MED ORDER — TRAMADOL HCL 50 MG PO TABS
50.0000 mg | ORAL_TABLET | Freq: Four times a day (QID) | ORAL | Status: DC
  Filled 2010-12-31: qty 1

## 2010-12-31 MED ORDER — ASPIRIN EC 325 MG PO TBEC: 325.0000 mg | DELAYED_RELEASE_TABLET | Freq: Every day | ORAL | Status: AC

## 2010-12-31 NOTE — Progress Notes (Signed)
PT IS TO BE DC TO HOME TODAY. PT HAS NO HH NEEDS. Willa Rough 12/31/2010 [phone] OR [phone]

## 2010-12-31 NOTE — Progress Notes (Signed)
UTILIZATION REVIEW COMPLETE ADP IS FOR HOME TODAY WITH NO HH NEEDS. Bruce Romero 12/31/2010 [phone] OR [phone]

## 2010-12-31 NOTE — Progress Notes (Signed)
*  PRELIMINARY RESULTS*  Carotid Doppler has been performed. Right: Mid ICA shows mild increase in velocities (40-59%) which is most likely due to very tortuous vessel. Left: No significant ICA stenosis. Antegrade vertebral flow bilaterally.  Farrel Demark 12/31/2010, 10:38 AM

## 2010-12-31 NOTE — Discharge Summary (Signed)
Bruce Romero, 65 y.o., DOB 05-20-1945, MRN [id number]. Admission date: 12/29/2010 Discharge Date 12/31/2010 Primary MD Loreen Freud, DO, DO Admitting Physician Leroy Sea, MD  Admission Diagnosis  Light-headedness [780.4] Tachycardia [785.0] Cervical strain [847.0] palipation   Discharge Diagnosis   Principal Problem:  *Palpitations Active Problems:  DIABETES MELLITUS  HYPERLIPIDEMIA      Past Medical History  Diagnosis Date  . 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 Diabetes mellitus   . Hyperlipidemia   . Hypertension     Past Surgical History  Procedure Date  . Cholecystectomy     Consults Cardiology  Significant Tests:  See full reports for all details     Dg Chest 2 View  12/30/2010  *RADIOLOGY REPORT*  Clinical Data: Hypertension.  Tachycardia.  Diabetes.  CHEST - 2 VIEW 12/29/2010:  Comparison: Two-view chest x-ray 12/16/2009 [facility].  Findings: Cardiac silhouette normal in size.  Thoracic aorta mildly tortuous, unchanged.  Hilar and mediastinal contours otherwise unremarkable.  Lungs clear.  Bronchovascular markings normal.  No pleural effusions.  Degenerative changes involving the thoracic spine.  No significant interval change.  IMPRESSION: No acute cardiopulmonary disease.  Stable examination.  Original Report Authenticated By: Arnell Sieving, M.D.   Echo- - Left ventricle: The cavity size was normal. There was moderate concentric hypertrophy. Systolic function was normal. The estimated ejection fraction was in the range of 60% to 65%. Wall motion was normal; there were no regional wall motion abnormalities. - Left atrium: The atrium was mildly dilated.   Carotid US - Right Mid ICA shows mild increase in velocities (40-59%) which is most likely due to very tortuous vessel. There is no obvious plaque to support this increase. Left ICA shows no significant ICA stenosis. Vertebrals are patent with antegrade flow. -  Mild calcific plaque in Left bifurcation.   Leg [place] - No evidence of deep vein or superficial thrombosis involving the right lower extremity and left lower extremity. - Dilated non thrombosed varicosity in the popliteal fossa - No evidence of Baker's cyst on the right or left.  CXR No acute cardiopulmonary disease. Stable examination.   Hospital Course See H&P, Labs, Consult and Test reports for all details in brief, patient was admitted for   1)Palpitations with sinus tachycarida - completely resolved with IVF, likely due to dehydration, needs Sugars closely monitored, EKG,ECH,Leg [place] stable, seen by Cardiology-outpt Event monitor.  2)Diabetes mellitus type 2 - home meds and outpt follow, counseled on Accuchecks and to PCP in 3 days with logs CBG (last 3)   Basename 12/31/10 1640 12/31/10 1156 12/31/10 0541  GLUCAP 167* 80 124*     3)Hypertension - stable on Home Meds.   4. H/O Dizziness in the recent past - as #1 above, Carotids show Mild-Mod dies ease , will place on ASA, please follow Lipid profile and Outpt Vas Surg follow in 1-2 weeks.     Today   Subjective:   Bruce Romero today has no headache,no chest abdominal pain,no new weakness tingling or numbness, feels much better wants to go home today.   Objective:   Blood pressure 126/84, pulse 55, temperature 97.7 F (36.5 C), temperature source Oral, resp. rate 16, height 5\' 10"  (1.778 m), weight 99.519 kg (219 lb 6.4 oz), SpO2 95.00%.  Intake/Output Summary (Last 24 hours) at 12/31/10 1704 Last data filed at 12/31/10 0700  Gross per 24 hour  Intake   1455 ml  Output  500 ml  Net    955 ml    Exam Awake Alert, Oriented *3, No new F.N deficits, Normal affect [place]da.AT,PERRAL Supple Neck,No JVD, No cervical lymphadenopathy appriciated.  Symmetrical Chest wall movement, Good air movement bilaterally, CTAB RRR,No Gallops,Rubs or new Murmurs, No Parasternal Heave +ve B.Sounds, Abd Soft, Non tender, No  organomegaly appriciated, No rebound -guarding or rigidity. No Cyanosis, Clubbing or edema, No new Rash or bruise  Data Review    CBC w Diff: Lab Results  Component Value Date   WBC 7.6 12/31/2010   HGB 14.6 12/31/2010   HCT 42.1 12/31/2010   PLT 177 12/31/2010   LYMPHOPCT 31 12/29/2010   MONOPCT 12 12/29/2010   EOSPCT 3 12/29/2010   BASOPCT 1 12/29/2010   CMP: Lab Results  Component Value Date   NA 136 12/31/2010   K 3.9 12/31/2010   CL 101 12/31/2010   CO2 25 12/31/2010   BUN 14 12/31/2010   CREATININE 0.86 12/31/2010   PROT 7.1 12/30/2010   ALBUMIN 4.0 12/30/2010   BILITOT 0.4 12/30/2010   ALKPHOS 48 12/30/2010   AST 26 12/30/2010   ALT 33 12/30/2010  . Discharge Instructions     Follow with Primary MD Loreen Freud, DO, DO in 3 days   Get CBC, CMP, checked 3 days by Primary MD and again as instructed by your Primary MD.   Accuchecks 4 times/day, Once in AM empty stomach and then before each meal. Log in all results and show them to your Prim.MD in 3 days. If any glucose reading is under 80 or above 300 call your Prim MD immidiately. Follow Low glucose instructions for glucose under 80 as instructed.  Get Medicines reviewed and adjusted.  Please request your Prim.MD to go over all Hospital Tests and Procedure/Radiological results at the follow up, please get all Hospital records sent to your Prim MD by signing hospital release before you go home.  Activity: Fall precautions use walker/cane & assistance as needed  Do not drive  until you have seen by Primary MD and advised to drive.  Do not drive when taking Pain medications.   Wear Seat belts while driving.  Diet: Diabetic low Sodium  For Heart failure patients - Check your Weight same time everyday, if you gain over 2 pounds, or you develop in leg swelling, experience more shortness of breath or chest pain, call your Primary MD immediately. Follow Cardiac Low Salt Diet and 1.8 lit/day fluid  restriction.  Disposition Home  If you experience worsening of your admission symptoms, develop shortness of breath, life threatening emergency, suicidal or homicidal thoughts you must seek medical attention immediately by calling 911 or calling your MD immediately  if symptoms less severe.  Do not take more than prescribed Pain, Sleep and Anxiety Medications  Special Instructions: If you have smoked or chewed Tobacco  in the last 2 yrs please stop smoking, stop any regular Alcohol  and or any Recreational drug use.  You Must read complete instructions/literature along with all the possible adverse reactions/side effects for all the Medicines you take and that have been prescribed to you. Take any new Medicines after you have completely understood and accpet all the possible adverse reactions/side effects.      Follow-up Information    Follow up with CROITORU,MIHAI. Make an appointment in 3 days.   Contact information:   [address] [place] [address] [phone]       Follow up with EARLY,TODD F,  MD. Make an appointment in 1 week.   Contact information:   [address] [place] [address] [phone]          Discharge Medications  Medication List  As of 12/31/2010  5:13 PM   START taking these medications         aspirin EC 325 MG tablet   Take 1 tablet (325 mg total) by mouth daily.         CONTINUE taking these medications         ALPRAZolam 0.25 MG tablet   Commonly known as: XANAX      amLODipine 10 MG tablet   Commonly known as: NORVASC   Take 1 tablet (10 mg total) by mouth daily.      esomeprazole 40 MG capsule   Commonly known as: NEXIUM   Take 1 capsule (40 mg total) by mouth daily.      fexofenadine 180 MG tablet   Commonly known as: ALLEGRA      fluticasone 50 MCG/ACT nasal spray   Commonly known as: FLONASE   Place 2 sprays into the nose daily.      glimepiride 4 MG tablet   Commonly known as:  AMARYL   Take 1 tablet (4 mg total) by mouth daily.      meloxicam 15 MG tablet   Commonly known as: MOBIC   Take 1 tablet (15 mg total) by mouth daily.      Niacin-Simvastatin 1000-40 MG Tb24   Take 1 tablet by mouth at bedtime.      quinapril 20 MG tablet   Commonly known as: ACCUPRIL   Take 1 tablet (20 mg total) by mouth at bedtime.      sitaGLIPtan-metformin 50-1000 MG per tablet   Commonly known as: JANUMET      spironolactone 25 MG tablet   Commonly known as: ALDACTONE   Take 1 tablet (25 mg total) by mouth daily.      Vitamin D 2000 UNITS tablet         STOP taking these medications         AMBIEN 10 MG tablet      cyclobenzaprine 10 MG tablet      HYDROcodone-acetaminophen 5-325 MG per tablet          Where to get your medications    These are the prescriptions that you need to pick up.   You may get these medications from any pharmacy.         aspirin EC 325 MG tablet           Total Time in preparing paper work, data evaluation and todays exam - 35 minutes  Signed: Leroy Sea 12/31/2010 5:04 PM  The patient was admitted to the Hospitalist Service.

## 2010-12-31 NOTE — Consult Note (Signed)
Reason for Consult: Palpitations and bradycardia and new dizziness in November.     Referring Physician: [facility]   Bruce Romero is an 65 y.o. male.    Chief Complaint: Palpitations described has fast heart rate.    HPI: Asked by the hospitalist see 65 year old white married male,  retired Medical illustrator for [employer],for tachycardia and with beta blocker added bradycardia during the night. Patient is diabetic with a history of hypertension. He was seen by Dr. Alanda Amass in the past and stress test was done which was negative for ischemia per the patient.  Patient had been in his usual state of health without complications except he does have cervical disc disease C3-C4 and stenosis of the foramen in C5 to having muscle spasms in his right neck and has undergone steroid injection. He had been attending physical therapy for the neck pain as well. Beginning November 1 [place] the beach had been [place] all day then after sneezing he became very dizzy became nervous this heart was also racing in this time frame he went to the emergency room in near his blood pressure was found to be elevated the dizziness it continued he underwent CT of his head which she was told was fine. He was told EKG was stable that his potassium was 3.1 and that he was dehydrated his glucose was also elevated at 224 he was given I V. Fluids approximately 2 L and Xanax.  He describes his dizziness as more of room spinning. Was initially anyway and felt syncopal on the initial presentation.  No chest pain. Since the episode November 1 he's had episodes of dizziness sometimes room spinning sometimes lightheaded difficult to assess completely he continues with pain in his neck typically on the right side.  Also since November his medications have been adjusted mostly due to the insurance changes his Advicor was changed to Zocor and his diabetic medications but change to change meds also a higher dose because of  hyperglycemia.  Over the last couple of days when he had a massage he would turn his head to the left and he would become dizzy. He states the dizziness is positional it is only the position of his head.  He has a history of hypertension and has been on his ACE inhibitor, spironolactone, and amlodipine for a very long time.  In the past he had a vasovagal symptom and was evaluated at [facility] and at that point they told him not to let his potassium drop or he could have a heart attack unsure of the context of that message.   On arrival here in the emergency room at [facility] he was found to be in a sinus tachycardia without EKG changes. His blood pressure was elevated at 182/84. Patient stated he did not have chest pain but was feeling dizzy, restless,  took his blood pressure  Which was elevated. He also felt his heart beating rapidly. They came to the emergency room. He was started on Lopressor and last evening during his sleep his heart rate dropped a sinus bradycardia of 47.  On further questioning his wife states he does snore at night and the patient himself states he wakes up catching his breath at times, possible sleep apnea.  Currently he has had carotid Dopplers, revealing mild ICA increase in velocities (40-59%) on the right though this is most likely due to the tortuosity of the vessel. The left had no significant ICA stenosis and the flow was antegrade vertebral bilaterally.  2 D echo  with normal EF, no acute processes except for LVH.  Troponin is negative, TSH is pending.    Past Medical History  Diagnosis Date  . 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 Diabetes mellitus   . Hyperlipidemia   . Hypertension     Past Surgical History  Procedure Date  . Cholecystectomy     Family History  Problem Relation Age of Onset  . Stroke Mother   . Pulmonary embolism Father    Social History:  reports that he has quit smoking. He has never used smokeless tobacco.  He reports that he drinks alcohol. He reports that he does not use illicit drugs.  Allergies:  Allergies  Allergen Reactions  . Cephalexin     REACTION: pt is not allergic to penicillin    Medications Prior to Admission  Medication Dose Route Frequency Provider Last Rate Last Dose  . 0.9 %  sodium chloride infusion   Intravenous Continuous Eduard Clos 75 mL/hr at 12/30/10 0701    . acetaminophen (TYLENOL) tablet 650 mg  650 mg Oral Q6H PRN Eduard Clos   650 mg at 12/31/10 8295   Or  . acetaminophen (TYLENOL) suppository 650 mg  650 mg Rectal Q6H PRN Eduard Clos      . ALPRAZolam Prudy Feeler) tablet 0.25 mg  0.25 mg Oral TID PRN Eduard Clos      . amLODipine (NORVASC) tablet 10 mg  10 mg Oral Daily Arshad N. Kakrakandy   10 mg at 12/31/10 6213  . clonazePAM (KLONOPIN) tablet 0.5 mg  0.5 mg Oral BID Leroy Sea, MD   0.5 mg at 12/31/10 0865  . cyclobenzaprine (FLEXERIL) tablet 10 mg  10 mg Oral Q8H PRN Eduard Clos      . enoxaparin (LOVENOX) injection 40 mg  40 mg Subcutaneous Q24H Leroy Sea, MD   40 mg at 12/31/10 0937  . fluticasone (FLONASE) 50 MCG/ACT nasal spray 2 spray  2 spray Each Nare Daily Eduard Clos      . glimepiride (AMARYL) tablet 4 mg  4 mg Oral QAC breakfast Eduard Clos   4 mg at 12/31/10 0631  . HYDROcodone-acetaminophen (NORCO) 5-325 MG per tablet 1 tablet  1 tablet Oral Q6H PRN Eduard Clos      . insulin aspart (novoLOG) injection 0-9 Units  0-9 Units Subcutaneous TID WC Leroy Sea, MD   1 Units at 12/31/10 0631  . lisinopril (PRINIVIL,ZESTRIL) tablet 20 mg  20 mg Oral Daily Hilario Quarry Amend, PHARMD   20 mg at 12/30/10 2127  . loratadine (CLARITIN) tablet 10 mg  10 mg Oral Daily Arshad N. Kakrakandy   10 mg at 12/31/10 0939  . niacin CR capsule 1,000 mg  1,000 mg Oral QHS Hilario [place], PHARMD   1,000 mg at 12/30/10 2127   And  . rosuvastatin (CRESTOR) tablet 10 mg  10 mg Oral QHS  Hilario Quarry Amend, PHARMD   10 mg at 12/30/10 2127  . ondansetron (ZOFRAN) tablet 4 mg  4 mg Oral Q6H PRN Eduard Clos       Or  . ondansetron (ZOFRAN) injection 4 mg  4 mg Intravenous Q6H PRN Eduard Clos      . pantoprazole (PROTONIX) EC tablet 40 mg  40 mg Oral Q1200 Arshad N. Kakrakandy   40 mg at 12/31/10 1206  . zolpidem (AMBIEN) tablet 10 mg  10 mg Oral QHS PRN Eduard Clos      . DISCONTD: metoprolol tartrate (LOPRESSOR) tablet 25 mg  25 mg Oral BID Leroy Sea, MD   25 mg at 12/30/10 2127  . DISCONTD: minoxidil (LONITEN) tablet 2.5 mg  2.5 mg Oral Daily Leroy Sea, MD      . DISCONTD: Niacin-Simvastatin 1000-40 MG TB24 1 tablet  1 tablet Oral QHS Eduard Clos      . DISCONTD: quinapril (ACCUPRIL) tablet 20 mg  20 mg Oral QHS Eduard Clos       Medications Prior to Admission  Medication Sig Dispense Refill  . ALPRAZolam (XANAX) 0.25 MG tablet Take by mouth 3 (three) times daily as needed. Take 1 tablet by mouth 30 minutes before procedure. For Anxiety       . amLODipine (NORVASC) 10 MG tablet Take 1 tablet (10 mg total) by mouth daily.  90 tablet  0  . Cholecalciferol (VITAMIN D) 2000 UNITS tablet Take 2,000 Units by mouth daily.        . cyclobenzaprine (FLEXERIL) 10 MG tablet Take 1 tablet (10 mg total) by mouth every 8 (eight) hours as needed for muscle spasms.  30 tablet  1  . esomeprazole (NEXIUM) 40 MG capsule Take 1 capsule (40 mg total) by mouth daily.  90 capsule  0  . fexofenadine (ALLEGRA) 180 MG tablet Take 180 mg by mouth daily.        . fluticasone (FLONASE) 50 MCG/ACT nasal spray Place 2 sprays into the nose daily.  16 g  5  . glimepiride (AMARYL) 4 MG tablet Take 1 tablet (4 mg total) by mouth daily.  90 tablet  0  . HYDROcodone-acetaminophen (NORCO) 5-325 MG per tablet Take 1 tablet by mouth every 6 (six) hours as needed. For pain      . meloxicam (MOBIC) 15 MG tablet Take 1 tablet (15 mg total) by mouth daily.  30  tablet  2  . Niacin-Simvastatin [facility]) 1000-40 MG TB24 Take 1 tablet by mouth at bedtime.  90 tablet  1  . quinapril (ACCUPRIL) 20 MG tablet Take 1 tablet (20 mg total) by mouth at bedtime.  90 tablet  0  . spironolactone (ALDACTONE) 25 MG tablet Take 1 tablet (25 mg total) by mouth daily.  90 tablet  0  . zolpidem (AMBIEN) 10 MG tablet Take 10 mg by mouth at bedtime as needed. For sleep        Results for orders placed during the hospital encounter of 12/29/10 (from the past 48 hour(s))  BASIC METABOLIC PANEL     Status: Abnormal   Collection Time   12/29/10 11:09 PM      Component Value Range Comment   Sodium 132 (*) 135 - 145 (mEq/L)    Potassium 4.1  3.5 - 5.1 (mEq/L)    Chloride 94 (*) 96 - 112 (mEq/L)    CO2 27  19 - 32 (mEq/L)    Glucose, Bld 149 (*) 70 - 99 (mg/dL)    BUN 15  6 - 23 (mg/dL)    Creatinine, Ser 4.54  0.50 - 1.35 (mg/dL)    Calcium 9.3  8.4 - 10.5 (mg/dL)    GFR calc non Af Amer 69 (*) >90 (mL/min)    GFR calc Af Amer 80 (*) >90 (mL/min)   CBC     Status: Normal   Collection Time   12/29/10 11:09 PM      Component Value Range  Comment   WBC 8.4  4.0 - 10.5 (K/uL)    RBC 4.90  4.22 - 5.81 (MIL/uL)    Hemoglobin 15.6  13.0 - 17.0 (g/dL)    HCT 09.8  11.9 - 14.7 (%)    MCV 88.6  78.0 - 100.0 (fL)    MCH 31.8  26.0 - 34.0 (pg)    MCHC 35.9  30.0 - 36.0 (g/dL)    RDW 82.9  56.2 - 13.0 (%)    Platelets 196  150 - 400 (K/uL)   DIFFERENTIAL     Status: Normal   Collection Time   12/29/10 11:09 PM      Component Value Range Comment   Neutrophils Relative 53  43 - 77 (%)    Neutro Abs 4.5  1.7 - 7.7 (K/uL)    Lymphocytes Relative 31  12 - 46 (%)    Lymphs Abs 2.6  0.7 - 4.0 (K/uL)    Monocytes Relative 12  3 - 12 (%)    Monocytes Absolute 1.0  0.1 - 1.0 (K/uL)    Eosinophils Relative 3  0 - 5 (%)    Eosinophils Absolute 0.3  0.0 - 0.7 (K/uL)    Basophils Relative 1  0 - 1 (%)    Basophils Absolute 0.0  0.0 - 0.1 (K/uL)   POCT I-STAT TROPONIN I     Status:  Normal   Collection Time   12/29/10 11:41 PM      Component Value Range Comment   Troponin i, poc 0.00  0.00 - 0.08 (ng/mL)    Comment 3            D-DIMER, QUANTITATIVE     Status: Normal   Collection Time   12/30/10  1:29 AM      Component Value Range Comment   D-Dimer, Quant 0.27  0.00 - 0.48 (ug/mL-FEU)   URINALYSIS, ROUTINE W REFLEX MICROSCOPIC     Status: Normal   Collection Time   12/30/10  2:19 AM      Component Value Range Comment   Color, Urine YELLOW  YELLOW     Appearance CLEAR  CLEAR     Specific Gravity, Urine 1.009  1.005 - 1.030     pH 7.0  5.0 - 8.0     Glucose, UA NEGATIVE  NEGATIVE (mg/dL)    Hgb urine dipstick NEGATIVE  NEGATIVE     Bilirubin Urine NEGATIVE  NEGATIVE     Ketones, ur NEGATIVE  NEGATIVE (mg/dL)    Protein, ur NEGATIVE  NEGATIVE (mg/dL)    Urobilinogen, UA 0.2  0.0 - 1.0 (mg/dL)    Nitrite NEGATIVE  NEGATIVE     Leukocytes, UA NEGATIVE  NEGATIVE  MICROSCOPIC NOT DONE ON URINES WITH NEGATIVE PROTEIN, BLOOD, LEUKOCYTES, NITRITE, OR GLUCOSE <1000 mg/dL.  URINE RAPID DRUG SCREEN (HOSP PERFORMED)     Status: Normal   Collection Time   12/30/10  2:19 AM      Component Value Range Comment   Opiates NONE DETECTED  NONE DETECTED     Cocaine NONE DETECTED  NONE DETECTED     Benzodiazepines NONE DETECTED  NONE DETECTED     Amphetamines NONE DETECTED  NONE DETECTED     Tetrahydrocannabinol NONE DETECTED  NONE DETECTED     Barbiturates NONE DETECTED  NONE DETECTED    TSH     Status: Normal   Collection Time   12/30/10  3:00 AM      Component Value Range Comment  TSH 1.100  0.350 - 4.500 (uIU/mL)   T4     Status: Normal   Collection Time   12/30/10  3:00 AM      Component Value Range Comment   T4, Total 9.7  5.0 - 12.5 (ug/dL)   GLUCOSE, CAPILLARY     Status: Abnormal   Collection Time   12/30/10  5:20 AM      Component Value Range Comment   Glucose-Capillary 150 (*) 70 - 99 (mg/dL)   COMPREHENSIVE METABOLIC PANEL     Status: Abnormal    Collection Time   12/30/10  6:00 AM      Component Value Range Comment   Sodium 135  135 - 145 (mEq/L)    Potassium 3.9  3.5 - 5.1 (mEq/L)    Chloride 98  96 - 112 (mEq/L)    CO2 26  19 - 32 (mEq/L)    Glucose, Bld 140 (*) 70 - 99 (mg/dL)    BUN 14  6 - 23 (mg/dL)    Creatinine, Ser 1.61  0.50 - 1.35 (mg/dL)    Calcium 9.0  8.4 - 10.5 (mg/dL)    Total Protein 7.1  6.0 - 8.3 (g/dL)    Albumin 4.0  3.5 - 5.2 (g/dL)    AST 26  0 - 37 (U/L)    ALT 33  0 - 53 (U/L)    Alkaline Phosphatase 48  39 - 117 (U/L)    Total Bilirubin 0.4  0.3 - 1.2 (mg/dL)    GFR calc non Af Amer 88 (*) >90 (mL/min)    GFR calc Af Amer >90  >90 (mL/min)   CBC     Status: Abnormal   Collection Time   12/30/10  6:00 AM      Component Value Range Comment   WBC 11.2 (*) 4.0 - 10.5 (K/uL)    RBC 4.98  4.22 - 5.81 (MIL/uL)    Hemoglobin 15.3  13.0 - 17.0 (g/dL)    HCT 09.6  04.5 - 40.9 (%)    MCV 88.8  78.0 - 100.0 (fL)    MCH 30.7  26.0 - 34.0 (pg)    MCHC 34.6  30.0 - 36.0 (g/dL)    RDW 81.1  91.4 - 78.2 (%)    Platelets 188  150 - 400 (K/uL)   MAGNESIUM     Status: Normal   Collection Time   12/30/10  6:00 AM      Component Value Range Comment   Magnesium 1.9  1.5 - 2.5 (mg/dL)   TSH     Status: Normal   Collection Time   12/30/10  6:00 AM      Component Value Range Comment   TSH 0.979  0.350 - 4.500 (uIU/mL)   T4, FREE     Status: Normal   Collection Time   12/30/10  6:00 AM      Component Value Range Comment   Free T4 1.42  0.80 - 1.80 (ng/dL)   T3, FREE     Status: Normal   Collection Time   12/30/10  6:00 AM      Component Value Range Comment   T3, Free 3.3  2.3 - 4.2 (pg/mL)   CARDIAC PANEL(CRET KIN+CKTOT+MB+TROPI)     Status: Normal   Collection Time   12/30/10  6:30 AM      Component Value Range Comment   Total CK 122  7 - 232 (U/L)    CK, MB 2.9  0.3 - 4.0 (  ng/mL)    Troponin I <0.30  <0.30 (ng/mL)    Relative Index 2.4  0.0 - 2.5    GLUCOSE, CAPILLARY     Status: Abnormal    Collection Time   12/30/10 11:43 AM      Component Value Range Comment   Glucose-Capillary 103 (*) 70 - 99 (mg/dL)   CARDIAC PANEL(CRET KIN+CKTOT+MB+TROPI)     Status: Abnormal   Collection Time   12/30/10 12:50 PM      Component Value Range Comment   Total CK 107  7 - 232 (U/L)    CK, MB 2.9  0.3 - 4.0 (ng/mL)    Troponin I <0.30  <0.30 (ng/mL)    Relative Index 2.7 (*) 0.0 - 2.5    GLUCOSE, CAPILLARY     Status: Abnormal   Collection Time   12/30/10  4:28 PM      Component Value Range Comment   Glucose-Capillary 139 (*) 70 - 99 (mg/dL)   CARDIAC PANEL(CRET KIN+CKTOT+MB+TROPI)     Status: Normal   Collection Time   12/30/10  9:31 PM      Component Value Range Comment   Total CK 98  7 - 232 (U/L)    CK, MB 2.5  0.3 - 4.0 (ng/mL)    Troponin I <0.30  <0.30 (ng/mL)    Relative Index RELATIVE INDEX IS INVALID  0.0 - 2.5    GLUCOSE, CAPILLARY     Status: Abnormal   Collection Time   12/30/10 10:33 PM      Component Value Range Comment   Glucose-Capillary 139 (*) 70 - 99 (mg/dL)   GLUCOSE, CAPILLARY     Status: Abnormal   Collection Time   12/31/10  5:41 AM      Component Value Range Comment   Glucose-Capillary 124 (*) 70 - 99 (mg/dL)   CBC     Status: Normal   Collection Time   12/31/10  6:00 AM      Component Value Range Comment   WBC 7.6  4.0 - 10.5 (K/uL)    RBC 4.68  4.22 - 5.81 (MIL/uL)    Hemoglobin 14.6  13.0 - 17.0 (g/dL)    HCT 84.6  96.2 - 95.2 (%)    MCV 90.0  78.0 - 100.0 (fL)    MCH 31.2  26.0 - 34.0 (pg)    MCHC 34.7  30.0 - 36.0 (g/dL)    RDW 84.1  32.4 - 40.1 (%)    Platelets 177  150 - 400 (K/uL)   BASIC METABOLIC PANEL     Status: Abnormal   Collection Time   12/31/10  6:00 AM      Component Value Range Comment   Sodium 136  135 - 145 (mEq/L)    Potassium 3.9  3.5 - 5.1 (mEq/L)    Chloride 101  96 - 112 (mEq/L)    CO2 25  19 - 32 (mEq/L)    Glucose, Bld 124 (*) 70 - 99 (mg/dL)    BUN 14  6 - 23 (mg/dL)    Creatinine, Ser 0.27  0.50 - 1.35  (mg/dL)    Calcium 8.8  8.4 - 10.5 (mg/dL)    GFR calc non Af Amer 88 (*) >90 (mL/min)    GFR calc Af Amer >90  >90 (mL/min)   MAGNESIUM     Status: Normal   Collection Time   12/31/10  6:00 AM      Component Value Range Comment   Magnesium 2.0  1.5 - 2.5 (mg/dL)   GLUCOSE, CAPILLARY     Status: Normal   Collection Time   12/31/10 11:56 AM      Component Value Range Comment   Glucose-Capillary 80  70 - 99 (mg/dL)    Dg Chest 2 View  40/98/1191  *RADIOLOGY REPORT*  Clinical Data: Hypertension.  Tachycardia.  Diabetes.  CHEST - 2 VIEW 12/29/2010:  Comparison: Two-view chest x-ray 12/16/2009 [facility].  Findings: Cardiac silhouette normal in size.  Thoracic aorta mildly tortuous, unchanged.  Hilar and mediastinal contours otherwise unremarkable.  Lungs clear.  Bronchovascular markings normal.  No pleural effusions.  Degenerative changes involving the thoracic spine.  No significant interval change.  IMPRESSION: No acute cardiopulmonary disease.  Stable examination.  Original Report Authenticated By: Arnell Sieving, M.D.    ROS: General: History of sinus infections none in over a month and has had sinus surgery in the past. Skin: No rashes or ulcers. HEENT: denies blurred vision only complaints of dizziness and neck pain due to cervical disc disease. Cardiovascular: No chest pain, episodic tachycardia. Pulmonary: No shortness of breath. Stopped 3 pack-a-day tobacco in 1990. GI: Denies diarrhea constipation melena. Colonoscopy this year with polypectomy by Dr. Evette Cristal. GU: No hematuria or dysuria and has had a urology evaluation within the past year Neuro: no syncope, see HPI concerning dizziness. Endocrine: Diabetic with recent hyperglycemia with steroids.  Blood pressure 126/84, pulse 55, temperature 97.7 F (36.5 C), temperature source Oral, resp. rate 16, height 5\' 10"  (1.778 m), weight 99.519 kg (219 lb 6.4 oz), SpO2 95.00%. PE: General: Alert oriented white male no acute  distress pleasant affect somewhat anxious about his symptoms Skin: Warm and dry brisk capillary refill. HEENT: Normocephalic, sclera clear. Movement of head back and forth does not cause dizziness at the current time. Neck: Supple, no JVD, right carotid bruit soft. Lungs: Clear without rales rhonchi or wheezes. Heart: S1-S2 regular rate and rhythm with a soft 1/6 systolic murmur. Do not appreciate gallop or rub. Abdomen: Soft nontender positive bowel sounds do not palpate liver spleen or masses. Extremities: No lower extremity edema 2+ pedal pulses bilaterally. Neuro: Alert oriented white male x3, moves all extremities, follows commands.   Assessment/Plan Patient Active Problem List  Diagnoses  . DIABETES MELLITUS  . UNSPECIFIED VITAMIN D DEFICIENCY  . HYPERLIPIDEMIA  . CERUMEN IMPACTION, BILATERAL  . ESSENTIAL HYPERTENSION, BENIGN  . SINUSITIS- ACUTE-NOS  . URI  . NASAL POLYP  . REFLUX, ESOPHAGEAL  . HEMATURIA  . PROSTATITIS, ACUTE  . SHOULDER PAIN, RIGHT  . FIBROMYALGIA  . FREQUENCY, URINARY  . POSTMENOPAUSAL STATUS  . Abdominal  pain, other specified site  . Palpitations   PLAN:  Patient primary complaint is palpitations which per his description is tachycardia. He also has since November been having episodes of tachycardia as well as dizziness and hypertension. He was worked up at [facility] with CT of his head with no acute process. Usually his blood pressure 120s over 80s and pulse in the 60s. Patient is diabetic. Patient's family history with one brother who has had 2 coronary stents and his father all dying of esophageal cancer did have a history of atrial flutter.  Patient is concerned these are anxiety attacks but he has never had this problem in the past. He does have anxiety but has always been mild. He has been retired for 2 years and no significant change in his life in these 2 years. The patient his wife and one of  the daughters were in the room during my exam and were  concerned about the cause of his problem.  He may need outpatient monitor, most likely will need sleep study. And it may be beneficial to do a stress Myoview. Dr. Royann Shivers will see him and decide on the timing of these studies and any further recommendations.  INGOLD,LAURA R 12/31/2010, 2:00 PM    I have seen and examined the patient along with Nada Boozer, NP.  I have reviewed the chart, notes and new data.  I agree with PA/NP's note.  The overall history is most compatible with orthostatic hypotension and dizziness do to relative hypovolemia. In turn this appears to be secondary to excessive hyperglycemia following numerous changes in his antidiabetic medications and steroid injection to the cervical spine.  He has a history of vasovagal syncope which makes him more prone to these events.  Although it is less likely that he suffered a true arrhythmia recommend that he have an outpatient 24-hour Holter monitor. He has numerous coronary risk factors we will consider performing an outpatient stress Myoview.  She does score fairly high on the Epworth Sleepiness Scale and his wife (who herself has sleep apnea) has witnessed the patient having apnea.we will schedule him for an outpatient sleep study.  PLAN: He is currently doing quite well. His vital signs are stable and he appears compensated. The remainder of his workup can be performed as an outpatient  Edwen Mclester 12/31/2010, 3:53 PM Thurmon Fair, MD, [facility] [phone]

## 2010-12-31 NOTE — Progress Notes (Signed)
Went in to speak with patient per hospitalist's request for a consult, but found out Bruce Romero identifies himself as a patient of Dr. Kandis Cocking and previously had a cardiac workup at [facility] including stress testing and he would like to continue with their group. Nada Boozer, NP made aware. Thank you.   Bruce Prestwood PA-C [facility]

## 2010-12-31 NOTE — Progress Notes (Signed)
Pt discharged to home with family, follow up appointments in place.  Pt verbalizes understanding all discharge instructions.

## 2011-01-04 ENCOUNTER — Other Ambulatory Visit: Payer: Self-pay | Admitting: Family Medicine

## 2011-01-04 ENCOUNTER — Ambulatory Visit (INDEPENDENT_AMBULATORY_CARE_PROVIDER_SITE_OTHER): Payer: Medicare Other | Admitting: Family Medicine

## 2011-01-04 ENCOUNTER — Encounter: Payer: Self-pay | Admitting: Family Medicine

## 2011-01-04 VITALS — BP 142/86 | HR 97 | Temp 98.6°F | Wt 222.0 lb

## 2011-01-04 DIAGNOSIS — S139XXA Sprain of joints and ligaments of unspecified parts of neck, initial encounter: Secondary | ICD-10-CM

## 2011-01-04 DIAGNOSIS — F419 Anxiety disorder, unspecified: Secondary | ICD-10-CM

## 2011-01-04 DIAGNOSIS — R9389 Abnormal findings on diagnostic imaging of other specified body structures: Secondary | ICD-10-CM

## 2011-01-04 DIAGNOSIS — R002 Palpitations: Secondary | ICD-10-CM

## 2011-01-04 DIAGNOSIS — F411 Generalized anxiety disorder: Secondary | ICD-10-CM

## 2011-01-04 DIAGNOSIS — E119 Type 2 diabetes mellitus without complications: Secondary | ICD-10-CM

## 2011-01-04 DIAGNOSIS — Z23 Encounter for immunization: Secondary | ICD-10-CM

## 2011-01-04 DIAGNOSIS — S161XXA Strain of muscle, fascia and tendon at neck level, initial encounter: Secondary | ICD-10-CM

## 2011-01-04 LAB — BASIC METABOLIC PANEL
Chloride: 99 mEq/L (ref 96–112)
Creatinine, Ser: 1 mg/dL (ref 0.4–1.5)
GFR: 77.56 mL/min (ref >60.00)
Potassium: 4 mEq/L (ref 3.5–5.1)

## 2011-01-04 LAB — CBC WITH DIFFERENTIAL/PLATELET
Basophils Absolute: 0.1 10*3/uL (ref 0.0–0.1)
Eosinophils Absolute: 0.1 10*3/uL (ref 0.0–0.7)
Lymphocytes Relative: 19.9 % (ref 12.0–46.0)
Lymphs Abs: 1.8 10*3/uL (ref 0.7–4.0)
MCV: 92.4 fl (ref 78.0–100.0)
Neutro Abs: 6.2 10*3/uL (ref 1.4–7.7)
Platelets: 201 10*3/uL (ref 150.0–400.0)
RBC: 5.13 Mil/uL (ref 4.22–5.81)
WBC: 8.8 10*3/uL (ref 4.5–10.5)

## 2011-01-04 LAB — HEPATIC FUNCTION PANEL: Albumin: 4.6 g/dL (ref 3.5–5.2)

## 2011-01-04 MED ORDER — ALPRAZOLAM 0.25 MG PO TABS: ORAL_TABLET | ORAL | Status: DC

## 2011-01-04 MED ORDER — MELOXICAM 15 MG PO TABS: 15.0000 mg | ORAL_TABLET | Freq: Every day | ORAL | Status: AC

## 2011-01-04 MED ORDER — CITALOPRAM HYDROBROMIDE 10 MG PO TABS: 10.0000 mg | ORAL_TABLET | Freq: Every day | ORAL | Status: DC

## 2011-01-04 MED ORDER — CYCLOBENZAPRINE HCL 5 MG PO TABS: 5.0000 mg | ORAL_TABLET | Freq: Three times a day (TID) | ORAL | Status: AC | PRN

## 2011-01-04 MED ORDER — SITAGLIP PHOS-METFORMIN HCL ER 100-1000 MG PO TB24: 1.0000 | ORAL_TABLET | ORAL | Status: DC

## 2011-01-04 NOTE — Assessment & Plan Note (Signed)
Recheck labs as requested in D/C summary F/u cardiology ---  ? Etiology anxiety celexa 10 mg 1 po qd Cont' xanax

## 2011-01-04 NOTE — Progress Notes (Signed)
  Subjective:    Patient ID: Bruce Romero, male    DOB: 1945-07-28, 65 y.o.   MRN: [id number]  HPI Pt is here for hospital f/u from palpitations.  Cardiac w/u was normal.  Pt states he feels better when he takes a xanax.   Pt has seen the cardiologist and will see him again next week.   He has not had any palpitations since d/c from hospital.    Review of Systems As above    Objective:   Physical Exam  Constitutional: He is oriented to person, place, and time. He appears well-developed and well-nourished.  Cardiovascular: Normal rate, regular rhythm and normal heart sounds.   No murmur heard. Pulmonary/Chest: Effort normal and breath sounds normal.  Neurological: He is alert and oriented to person, place, and time.  Psychiatric: He has a normal mood and affect. His behavior is normal. Judgment and thought content normal.          Assessment & Plan:  Palpitations----maybe anxiety                        celexa 10 mg 1 po qd                         con't xanax                         F/u 1 month or sooner prn----keep cardio f/u

## 2011-01-04 NOTE — Patient Instructions (Signed)

## 2011-02-03 ENCOUNTER — Encounter: Payer: Self-pay | Admitting: Family Medicine

## 2011-02-11 ENCOUNTER — Ambulatory Visit (INDEPENDENT_AMBULATORY_CARE_PROVIDER_SITE_OTHER): Payer: Medicare Other | Admitting: Family Medicine

## 2011-02-11 ENCOUNTER — Encounter: Payer: Self-pay | Admitting: Family Medicine

## 2011-02-11 DIAGNOSIS — E785 Hyperlipidemia, unspecified: Secondary | ICD-10-CM

## 2011-02-11 DIAGNOSIS — F411 Generalized anxiety disorder: Secondary | ICD-10-CM

## 2011-02-11 DIAGNOSIS — J069 Acute upper respiratory infection, unspecified: Secondary | ICD-10-CM

## 2011-02-11 DIAGNOSIS — F419 Anxiety disorder, unspecified: Secondary | ICD-10-CM

## 2011-02-11 DIAGNOSIS — E119 Type 2 diabetes mellitus without complications: Secondary | ICD-10-CM

## 2011-02-11 DIAGNOSIS — I2510 Atherosclerotic heart disease of native coronary artery without angina pectoris: Secondary | ICD-10-CM

## 2011-02-11 DIAGNOSIS — M479 Spondylosis, unspecified: Secondary | ICD-10-CM

## 2011-02-11 DIAGNOSIS — I10 Essential (primary) hypertension: Secondary | ICD-10-CM

## 2011-02-11 MED ORDER — AMLODIPINE BESYLATE 10 MG PO TABS: 10.0000 mg | ORAL_TABLET | Freq: Every day | ORAL | Status: DC

## 2011-02-11 MED ORDER — ASPIRIN 81 MG PO TABS: 160.0000 mg | ORAL_TABLET | Freq: Every day | ORAL | Status: AC

## 2011-02-11 MED ORDER — SITAGLIP PHOS-METFORMIN HCL ER 100-1000 MG PO TB24: 1.0000 | ORAL_TABLET | ORAL | Status: DC

## 2011-02-11 MED ORDER — GLIMEPIRIDE 4 MG PO TABS: 4.0000 mg | ORAL_TABLET | Freq: Every day | ORAL | Status: DC

## 2011-02-11 MED ORDER — QUINAPRIL HCL 20 MG PO TABS: 20.0000 mg | ORAL_TABLET | Freq: Every day | ORAL | Status: DC

## 2011-02-11 MED ORDER — ESOMEPRAZOLE MAGNESIUM 40 MG PO CPDR: 40.0000 mg | DELAYED_RELEASE_CAPSULE | Freq: Every day | ORAL | Status: DC

## 2011-02-11 MED ORDER — NIACIN-SIMVASTATIN ER 1000-40 MG PO TB24: 1.0000 | ORAL_TABLET | Freq: Every day | ORAL | Status: DC

## 2011-02-11 MED ORDER — CITALOPRAM HYDROBROMIDE 10 MG PO TABS: 10.0000 mg | ORAL_TABLET | Freq: Every day | ORAL | Status: DC

## 2011-02-11 NOTE — Progress Notes (Signed)
  Subjective:    Patient ID: Bruce Romero, male    DOB: [birth year], 66 y.o.   MRN: [id number]  HPI Pt here for f/u.  Pt is doing well.  Pt c/o R ear feels stopped up other wise he is doing well.  HYPERTENSION Disease Monitoring Blood pressure range-  110/64--121/69 Chest pain- no   Dyspnea- no Medications Compliance- good Lightheadedness- good Edema- good   DIABETES Disease Monitoring Blood Sugar ranges-79-148 Polyuria- no New Visual problems- *no Medications Compliance- good Hypoglycemic symptoms- no   HYPERLIPIDEMIA Disease Monitoring See symptoms for Hypertension Medications Compliance- good RUQ pain- no Muscle aches- no  ROS See HPI above   PMH Smoking Status noted    Review of Systems as above   Objective:   Physical Exam  Constitutional: He is oriented to person, place, and time. He appears well-developed and well-nourished.  Musculoskeletal: He exhibits no edema and no tenderness.  Neurological: He is alert and oriented to person, place, and time.  Psychiatric: He has a normal mood and affect. His behavior is normal. Judgment and thought content normal.          Assessment & Plan:

## 2011-02-11 NOTE — Assessment & Plan Note (Signed)
Stable con't meds 

## 2011-02-11 NOTE — Assessment & Plan Note (Signed)
con't meds Check labs in May

## 2011-02-11 NOTE — Patient Instructions (Signed)

## 2011-02-11 NOTE — Assessment & Plan Note (Signed)
Stable con't meds Check labs in May

## 2011-02-15 ENCOUNTER — Other Ambulatory Visit: Payer: Self-pay | Admitting: Family Medicine

## 2011-02-15 MED ORDER — AMLODIPINE BESYLATE 10 MG PO TABS: 10.0000 mg | ORAL_TABLET | Freq: Every day | ORAL | Status: DC

## 2011-02-15 NOTE — Telephone Encounter (Signed)
I called [facility] Rx not received---Re-faxed     KP

## 2011-02-21 ENCOUNTER — Telehealth: Payer: Self-pay | Admitting: *Deleted

## 2011-02-21 NOTE — Telephone Encounter (Signed)
Ok to just stop if only taking 10 mg

## 2011-02-21 NOTE — Telephone Encounter (Signed)
Patient requesting to D/C usage of Celexa 10 mg, "feels like Rx is not needed anymore" and is causing trouble with sleep. Requesting directions to taper off of medication. Please advise.

## 2011-02-21 NOTE — Telephone Encounter (Signed)
Patient made aware and he voiced understanding    KP

## 2011-02-25 ENCOUNTER — Telehealth: Payer: Self-pay

## 2011-02-25 MED ORDER — NIACIN ER (ANTIHYPERLIPIDEMIC) 1000 MG PO TBCR: 1000.0000 mg | EXTENDED_RELEASE_TABLET | Freq: Every day | ORAL | Status: DC

## 2011-02-25 MED ORDER — ATORVASTATIN CALCIUM 10 MG PO TABS: 10.0000 mg | ORAL_TABLET | Freq: Every day | ORAL | Status: DC

## 2011-02-25 NOTE — Telephone Encounter (Signed)
Letter from insurance company advising- Amlodipine and Simcor can not be combined. Per Dr. Laury Axon change to Niaspan 1000 mg  #30 mg 1 po qhs and Lipitor 10 mg 1 po qhs # 30.----  msg left to call the office    KP

## 2011-02-25 NOTE — Telephone Encounter (Signed)
Discussed with patient and he voiced understanding ----Rx faxed to mail order and to [facility] or a 30 day supply.    KP

## 2011-03-01 ENCOUNTER — Other Ambulatory Visit: Payer: Medicare Other

## 2011-03-01 ENCOUNTER — Encounter: Payer: Medicare Other | Admitting: Vascular Surgery

## 2011-03-07 ENCOUNTER — Telehealth: Payer: Self-pay

## 2011-03-07 NOTE — Telephone Encounter (Signed)
Discussed with [facility] pharmacist Kathlene November and he wanted the verbal to discontinue the Simcor and continue the Amlodipine.  Done and they have verified that they have received the Lipitor and Niacin to replace the Simcor.  KP

## 2011-03-22 ENCOUNTER — Ambulatory Visit (INDEPENDENT_AMBULATORY_CARE_PROVIDER_SITE_OTHER): Payer: Medicare Other | Admitting: Family Medicine

## 2011-03-22 ENCOUNTER — Encounter: Payer: Self-pay | Admitting: Family Medicine

## 2011-03-22 VITALS — BP 120/70 | HR 67 | Temp 98.1°F | Wt 227.6 lb

## 2011-03-22 DIAGNOSIS — J329 Chronic sinusitis, unspecified: Secondary | ICD-10-CM

## 2011-03-22 DIAGNOSIS — H109 Unspecified conjunctivitis: Secondary | ICD-10-CM

## 2011-03-22 MED ORDER — MOXIFLOXACIN HCL 0.5 % OP SOLN: 1.0000 [drp] | Freq: Three times a day (TID) | OPHTHALMIC | Status: AC

## 2011-03-22 MED ORDER — BECLOMETHASONE DIPROPIONATE 80 MCG/ACT NA AERS: 2.0000 | INHALATION_SPRAY | Freq: Every day | NASAL | Status: DC

## 2011-03-22 MED ORDER — AMOXICILLIN-POT CLAVULANATE 875-125 MG PO TABS: 1.0000 | ORAL_TABLET | Freq: Two times a day (BID) | ORAL | Status: AC

## 2011-03-22 NOTE — Patient Instructions (Signed)

## 2011-03-22 NOTE — Progress Notes (Signed)
  Subjective:     Bruce Romero is a 66 y.o. male who presents for evaluation of symptoms of a URI, possible sinusitis. Symptoms include congestion, facial pain, nasal congestion, no  fever, post nasal drip, productive cough with  yellow colored sputum, purulent nasal discharge and sinus pressure. Onset of symptoms was 5 days ago, and has been gradually worsening since that time. Treatment to date: mucinex.  The following portions of the patient's history were reviewed and updated as appropriate: allergies, current medications, past family history, past medical history, past social history, past surgical history and problem list.  Review of Systems Pertinent items are noted in HPI.   Objective:    BP 120/70  Pulse 67  Temp(Src) 98.1 F (36.7 C) (Oral)  Wt 227 lb 9.6 oz (103.239 kg)  SpO2 96% General appearance: alert, cooperative, appears stated age and no distress Ears: normal TM's and external ear canals both ears Nose: green discharge, moderate congestion, turbinates red, swollen, edematous, sinus tenderness bilateral Throat: lips, mucosa, and tongue normal; teeth and gums normal Neck: no adenopathy, supple, symmetrical, trachea midline and thyroid not enlarged, symmetric, no tenderness/mass/nodules Lungs: clear to auscultation bilaterally Heart: S1, S2 normal Extremities: extremities normal, atraumatic, no cyanosis or edema   Assessment:    sinusitis   Plan:    Discussed the diagnosis and treatment of sinusitis. Suggested symptomatic OTC remedies. Nasal saline spray for congestion. Ceftin per orders. Nasal steroids per orders. Follow up as needed.

## 2011-04-15 ENCOUNTER — Ambulatory Visit (INDEPENDENT_AMBULATORY_CARE_PROVIDER_SITE_OTHER): Payer: Medicare Other | Admitting: Family Medicine

## 2011-04-15 ENCOUNTER — Encounter: Payer: Self-pay | Admitting: Family Medicine

## 2011-04-15 VITALS — BP 114/76 | HR 77 | Temp 97.5°F | Wt 225.0 lb

## 2011-04-15 DIAGNOSIS — K5289 Other specified noninfective gastroenteritis and colitis: Secondary | ICD-10-CM

## 2011-04-15 DIAGNOSIS — K529 Noninfective gastroenteritis and colitis, unspecified: Secondary | ICD-10-CM

## 2011-04-15 DIAGNOSIS — R319 Hematuria, unspecified: Secondary | ICD-10-CM

## 2011-04-15 LAB — CBC WITH DIFFERENTIAL/PLATELET
Basophils Relative: 0.6 % (ref 0.0–3.0)
Eosinophils Relative: 2.7 % (ref 0.0–5.0)
HCT: 48.4 % (ref 39.0–52.0)
Lymphs Abs: 1.6 10*3/uL (ref 0.7–4.0)
MCV: 91.9 fl (ref 78.0–100.0)
Monocytes Absolute: 0.6 10*3/uL (ref 0.1–1.0)
Platelets: 182 10*3/uL (ref 150.0–400.0)
RBC: 5.26 Mil/uL (ref 4.22–5.81)
WBC: 8.3 10*3/uL (ref 4.5–10.5)

## 2011-04-15 LAB — HEPATIC FUNCTION PANEL
ALT: 67 U/L — ABNORMAL HIGH (ref 0–53)
Bilirubin, Direct: 0.1 mg/dL (ref 0.0–0.3)
Total Protein: 7.8 g/dL (ref 6.0–8.3)

## 2011-04-15 LAB — POCT URINALYSIS DIPSTICK
Glucose, UA: NEGATIVE
Leukocytes, UA: NEGATIVE
Nitrite, UA: NEGATIVE
Protein, UA: NEGATIVE
Spec Grav, UA: 1.01
Urobilinogen, UA: 0.2

## 2011-04-15 LAB — BASIC METABOLIC PANEL
BUN: 10 mg/dL (ref 6–23)
Chloride: 102 mEq/L (ref 96–112)
Potassium: 4 mEq/L (ref 3.5–5.1)

## 2011-04-15 MED ORDER — PROMETHAZINE HCL 25 MG PO TABS: 25.0000 mg | ORAL_TABLET | Freq: Three times a day (TID) | ORAL | Status: AC | PRN

## 2011-04-15 MED ORDER — PROMETHAZINE HCL 25 MG PO TABS: 25.0000 mg | ORAL_TABLET | Freq: Three times a day (TID) | ORAL | Status: DC | PRN

## 2011-04-15 NOTE — Progress Notes (Signed)
  Subjective:     Bruce Romero is a 66 y.o. male who presents for evaluation of nonbilious vomiting a few times per day and nausea. Symptoms have been present for 5 days. Patient denies acholic stools, blood in stool, constipation, dark urine, dysuria, fever, heartburn, hematemesis, hematuria and melena. Patient's oral intake has been normal. Patient's urine output has been adequate. Other contacts with similar symptoms include: none. Patient denies recent travel history. Patient has not had recent ingestion of possible contaminated food, toxic plants, or inappropriate medications/poisons.   The following portions of the patient's history were reviewed and updated as appropriate: allergies, current medications, past family history, past medical history, past social history, past surgical history and problem list.  Review of Systems Pertinent items are noted in HPI.    Objective:     BP 114/76  Pulse 77  Temp(Src) 97.5 F (36.4 C) (Oral)  Wt 225 lb (102.059 kg)  SpO2 77% General appearance: alert, cooperative, appears stated age and no distress Lungs: clear to auscultation bilaterally Heart: S1, S2 normal Abdomen: soft, non-tender; bowel sounds normal; no masses,  no organomegaly    Assessment:    Acute Gastroenteritis --- improving   Plan:    1. Discussed oral rehydration, reintroduction of solid foods, signs of dehydration. 2. Return or go to emergency department if worsening symptoms, blood or bile, signs of dehydration, diarrhea lasting longer than 5 days or any new concerns. 3. Follow up in a few days or sooner as needed.

## 2011-04-15 NOTE — Progress Notes (Signed)
Addended by: Silvio Pate D on: 04/15/2011 01:23 PM   Modules accepted: Orders

## 2011-04-15 NOTE — Patient Instructions (Signed)
Nausea and Vomiting  Nausea is a sick feeling that often comes before throwing up (vomiting). Vomiting is a reflex where stomach contents come out of your mouth. Vomiting can cause severe loss of body fluids (dehydration). Children and elderly adults can become dehydrated quickly, especially if they also have diarrhea. Nausea and vomiting are symptoms of a condition or disease. It is important to find the cause of your symptoms.  CAUSES    Direct irritation of the stomach lining. This irritation can result from increased acid production (gastroesophageal reflux disease), infection, food poisoning, taking certain medicines (such as nonsteroidal anti-inflammatory drugs), alcohol use, or tobacco use.   Signals from the brain.These signals could be caused by a headache, heat exposure, an inner ear disturbance, increased pressure in the brain from injury, infection, a tumor, or a concussion, pain, emotional stimulus, or metabolic problems.   An obstruction in the gastrointestinal tract (bowel obstruction).   Illnesses such as diabetes, hepatitis, gallbladder problems, appendicitis, kidney problems, cancer, sepsis, atypical symptoms of a heart attack, or eating disorders.   Medical treatments such as chemotherapy and radiation.   Receiving medicine that makes you sleep (general anesthetic) during surgery.  DIAGNOSIS  Your caregiver may ask for tests to be done if the problems do not improve after a few days. Tests may also be done if symptoms are severe or if the reason for the nausea and vomiting is not clear. Tests may include:   Urine tests.   Blood tests.   Stool tests.   Cultures (to look for evidence of infection).   X-rays or other imaging studies.  Test results can help your caregiver make decisions about treatment or the need for additional tests.  TREATMENT  You need to stay well hydrated. Drink frequently but in small amounts.You may wish to drink water, sports drinks, clear broth, or eat frozen  ice pops or gelatin dessert to help stay hydrated.When you eat, eating slowly may help prevent nausea.There are also some antinausea medicines that may help prevent nausea.  HOME CARE INSTRUCTIONS    Take all medicine as directed by your caregiver.   If you do not have an appetite, do not force yourself to eat. However, you must continue to drink fluids.   If you have an appetite, eat a normal diet unless your caregiver tells you differently.   Eat a variety of complex carbohydrates (rice, wheat, potatoes, bread), lean meats, yogurt, fruits, and vegetables.   Avoid high-fat foods because they are more difficult to digest.   Drink enough water and fluids to keep your urine clear or pale yellow.   If you are dehydrated, ask your caregiver for specific rehydration instructions. Signs of dehydration may include:   Severe thirst.   Dry lips and mouth.   Dizziness.   Dark urine.   Decreasing urine frequency and amount.   Confusion.   Rapid breathing or pulse.  SEEK IMMEDIATE MEDICAL CARE IF:    You have blood or brown flecks (like coffee grounds) in your vomit.   You have black or bloody stools.   You have a severe headache or stiff neck.   You are confused.   You have severe abdominal pain.   You have chest pain or trouble breathing.   You do not urinate at least once every 8 hours.   You develop cold or clammy skin.   You continue to vomit for longer than 24 to 48 hours.   You have a fever.  MAKE SURE YOU:      Understand these instructions.   Will watch your condition.   Will get help right away if you are not doing well or get worse.  Document Released: 01/24/2005 Document Revised: 01/13/2011 Document Reviewed: 06/23/2010  [employer] Patient Information 2012 [employer].

## 2011-04-18 ENCOUNTER — Encounter: Payer: Self-pay | Admitting: Family Medicine

## 2011-04-19 LAB — URINE CULTURE: Colony Count: 50000

## 2011-04-20 ENCOUNTER — Other Ambulatory Visit (INDEPENDENT_AMBULATORY_CARE_PROVIDER_SITE_OTHER): Payer: Medicare Other

## 2011-04-20 DIAGNOSIS — E785 Hyperlipidemia, unspecified: Secondary | ICD-10-CM

## 2011-04-20 LAB — HEPATIC FUNCTION PANEL
ALT: 35 U/L (ref 0–53)
Albumin: 4 g/dL (ref 3.5–5.2)
Total Protein: 6.7 g/dL (ref 6.0–8.3)

## 2011-07-05 ENCOUNTER — Other Ambulatory Visit: Payer: Self-pay | Admitting: Family Medicine

## 2011-07-05 MED ORDER — AMLODIPINE BESYLATE 10 MG PO TABS: 10.0000 mg | ORAL_TABLET | Freq: Every day | ORAL | Status: DC

## 2011-07-05 MED ORDER — GLIMEPIRIDE 4 MG PO TABS: 4.0000 mg | ORAL_TABLET | Freq: Every day | ORAL | Status: DC

## 2011-07-05 MED ORDER — QUINAPRIL HCL 20 MG PO TABS: 20.0000 mg | ORAL_TABLET | Freq: Every day | ORAL | Status: DC

## 2011-07-05 MED ORDER — NIACIN ER (ANTIHYPERLIPIDEMIC) 1000 MG PO TBCR: 1000.0000 mg | EXTENDED_RELEASE_TABLET | Freq: Every day | ORAL | Status: DC

## 2011-07-05 MED ORDER — ESOMEPRAZOLE MAGNESIUM 40 MG PO CPDR: 40.0000 mg | DELAYED_RELEASE_CAPSULE | Freq: Every day | ORAL | Status: DC

## 2011-07-05 NOTE — Telephone Encounter (Signed)
Refills x 5 1-nexium cap 40mg   Take one capsule daily  Requesting 90-day supply Last written 1.4.13  2-niaspan er tab 1000mg  Take one tablet at bedtime  Requesting 90-day supply Last written 1.18.13  3-quinapril tab 20mg   Take one tablet at bedtime Requesting 90-day supply Last written 1.4.13  4-glimepiride tab 40mg  Take one tablet daily  Requesting 90-day supply Last written 1.4.13  5-lipitor tab 10mg  Take one tablet daily Requesting 90-day supply Last written 1.18.13  Last ov 3.8.13

## 2011-07-05 NOTE — Telephone Encounter (Signed)
Addended by: Arnette Norris on: 07/05/2011 10:58 AM   Modules accepted: Orders

## 2011-07-06 ENCOUNTER — Other Ambulatory Visit: Payer: Self-pay | Admitting: Family Medicine

## 2011-07-06 MED ORDER — SITAGLIP PHOS-METFORMIN HCL ER 100-1000 MG PO TB24: 1.0000 | ORAL_TABLET | ORAL | Status: DC

## 2011-07-06 NOTE — Telephone Encounter (Signed)
Refill done.  

## 2011-07-06 NOTE — Telephone Encounter (Signed)
refill janumet xr tab [phone] Take one tablet daily  Requesting 90-day supply  I do not see on snapshot or meds list Last ov 3.8.13

## 2011-08-15 ENCOUNTER — Encounter: Payer: Self-pay | Admitting: Family Medicine

## 2011-08-15 ENCOUNTER — Ambulatory Visit (INDEPENDENT_AMBULATORY_CARE_PROVIDER_SITE_OTHER): Payer: Medicare Other | Admitting: Family Medicine

## 2011-08-15 ENCOUNTER — Telehealth: Payer: Self-pay | Admitting: Family Medicine

## 2011-08-15 VITALS — BP 132/86 | HR 63 | Temp 97.9°F | Wt 222.2 lb

## 2011-08-15 DIAGNOSIS — R350 Frequency of micturition: Secondary | ICD-10-CM

## 2011-08-15 DIAGNOSIS — R102 Pelvic and perineal pain: Secondary | ICD-10-CM

## 2011-08-15 DIAGNOSIS — R109 Unspecified abdominal pain: Secondary | ICD-10-CM

## 2011-08-15 LAB — POCT URINALYSIS DIPSTICK
Bilirubin, UA: NEGATIVE
Leukocytes, UA: NEGATIVE
Nitrite, UA: NEGATIVE
Protein, UA: NEGATIVE
pH, UA: 6

## 2011-08-15 MED ORDER — AMLODIPINE BESYLATE 10 MG PO TABS: 10.0000 mg | ORAL_TABLET | Freq: Every day | ORAL | Status: DC

## 2011-08-15 MED ORDER — CIPROFLOXACIN HCL 500 MG PO TABS: 500.0000 mg | ORAL_TABLET | Freq: Two times a day (BID) | ORAL | Status: DC

## 2011-08-15 NOTE — Patient Instructions (Signed)
Prostatitis Prostatitis is an inflammation (the body's way of reacting to injury and/or infection) of the prostate gland. The prostate gland is a male organ. The gland is about the size and shape of a walnut. The prostate is located just below the bladder. It produces semen, which is a fluid that helps nourish and transport sperm. Prostatitis is the most common urinary tract problem in men younger than age 67. There are 4 categories of prostatitis:  I - Acute bacterial prostatitis.   II - Chronic bacterial prostatitis.   III - Chronic prostatitis and chronic pelvic pain syndrome (CPPS).   Inflammatory.   Non inflammatory.   IV - Asymptomatic inflammatory prostatitis.  Acute and chronic bacterial prostatitis are problems with bacterial infections of the prostate. "Acute" infection is usually a one-time problem. "Chronic" bacterial prostatitis is a condition with recurrent infection. It is usually caused by the same germ(bacteria). CPPS has symptoms similar to prostate infection. However, no infection is actually found. This condition can cause problems of ongoing pain. Currently, it cannot be cured. Treatments are available and aimed at symptom control.  Asymptomatic inflammatory prostatitis has no symptoms. It is a condition where infection-fighting cells are found by chance in the urine. The diagnosis is made most often during an exam for other conditions. Other conditions could be infertility or a high level of PSA (prostate-specific antigen) in the blood. SYMPTOMS  Symptoms can vary depending upon the type of prostatitis that exists. There can also be overlap in symptoms. This can make diagnosis difficult. Symptoms: For Acute bacterial prostatitis  Painful urination.   Fever or chills.   Muscle or joint pains.   Low back pain.   Low abdominal pain.   Inability to empty bladder completely.   Sudden urges to urinate.   Frequent urination during the day.   Difficulty starting  urine stream.   Need to urinate several times at night (nocturia).   Weak urine stream.   Urethral (tube that carries urine from the bladder out of the body) discharge and dribbling after urination.  For Chronic bacterial prostatitis  Rectal pain.   Pain in the testicles, penis, or tip of the penis.   Pain in the space between the anus and scrotum (perineum).   Low back pain.   Low abdominal pain.   Problems with sexual function.   Painful ejaculation.   Bloody semen.   Inability to empty bladder completely.   Painful urination.   Sudden urges to urinate.   Frequent urination during the day.   Difficulty starting urine stream.   Need to urinate several times at night (nocturia).   Weak urine stream.   Dribbling after urination.   Urethral discharge.  For Chronic prostatitis and chronic pelvic pain syndrome (CPPS) Symptoms are the same as those for chronic bacterial prostatitis. Problems with sexual function are often the reason for seeking care. This important problem should be discussed with your caregiver. For Asymptomatic inflammatory prostatitis As noted above, there are no symptoms with this condition. DIAGNOSIS   Your caregiver may perform a rectal exam. This exam is to determine if the prostate is swollen and tender.   Sometimes blood work is performed. This is done to see if your white blood cell count is elevated. The Prostate Specific Antigen (PSA) is also measured. PSA is a blood test that can help detect early prostate cancer.   A urinalysis is done to find out what type of infection is present if this is a suspected cause. An   additional urinalysis may be done after a digital rectal exam. This is to see if white blood cells are pushed out of the prostate and into the urine. A low-grade infection of the prostate may not be found on the first urinalysis.  In more difficult cases, your caregiver may advise other tests. Tests could include:  Urodynamics  -- Tests the function of the bladder and the organs involved in triggering and controlling normal urination.   Urine flow rate.   Cystoscopy -- In this procedure, a thin, telescope-like tube with a light and tiny camera attached (cystoscope) is inserted into the bladder through the urethra. This allows the caregiver to see the inside of the urethra and bladder.   Electromyography -- This procedure tests how the muscles and nerves of the bladder work. It is focused on the muscles that control the anus and pelvic floor. These are the muscles between the anus and scrotum.  In people who show no signs of infection, certain uncommon infections might be causing constant or recurrent symptoms. These uncommon infections are difficult to detect. More work in medicine may help find solutions to these problems. TREATMENT  Antibiotics are used to treat infections caused by germs. If the infection is not treated and becomes long lasting (chronic), it may become a lower grade infection with minor, continual problems. Without treatment, the prostate may develop a boil or furuncle (abscess). This may require surgical treatment. For those with chronic prostatitis and CPPS, it is important to work closely with your primary caregiver and urologist. For some, the medicines that are used to treat a non-cancerous, enlarged prostate (benign prostatic hypertrophy) may be helpful. Referrals to specialists other than urologists may be necessary. In rare cases when all treatments have been inadequate for pain control, an operation to remove the prostate may be recommended. This is very rare and before this is considered thorough discussion with your urologist is highly recommended.  In cases of secondary to chronic non-bacterial prostatitis, a good relationship with your urologist or primary caregiver is essential because it is often a recurrent prolonged condition that requires a good understanding of the causes and a commitment  to therapy aimed at controlling your symptoms. HOME CARE INSTRUCTIONS   Hot sitz baths for 20 minutes, 4 times per day, may help relieve pain.   Non-prescription pain killers may be used as your caregiver recommends if you have no allergies to them. Some illnesses or conditions prevent use of non-prescription drugs. If unsure, check with your caregiver. Take all medications as directed. Take the antibiotics for the prescribed length of time, even if you are feeling better.  SEEK MEDICAL CARE IF:   You have any worsening of the symptoms that originally brought you to your caregiver.   You have an oral temperature above 102 F (38.9 C).   You experience any side effects from medications prescribed.  SEEK IMMEDIATE MEDICAL CARE IF:   You have an oral temperature above 102 F (38.9 C), not controlled by medicine.   You have pain not relieved with medications.   You develop nausea, vomiting, lightheadedness, or have a fainting episode.   You are unable to urinate.   You pass bloody urine or clots.  Document Released: 01/22/2000 Document Revised: 01/13/2011 Document Reviewed: 12/27/2010 [employer] Patient Information 2012 [employer]. 

## 2011-08-15 NOTE — Telephone Encounter (Signed)
Refill: Norvasc tab 10mg . Take 1 tablet daily. 90 day supply

## 2011-08-15 NOTE — Progress Notes (Signed)
  Subjective:    Bruce Romero is a 66 y.o. male who complains of frequency, hesitancy, suprapubic pressure and urgency. He has had symptoms for several days. Patient also complains of back pain and stomach ache. Patient denies congestion, cough, fever, headache, rhinitis and sorethroat. Patient does not have a history of recurrent UTI. Patient does not have a history of pyelonephritis. Pt states it feels the same as when he had prostatitis.  The following portions of the patient's history were reviewed and updated as appropriate: allergies, current medications, past family history, past medical history, past social history, past surgical history and problem list.  Review of Systems Pertinent items are noted in HPI.    Objective:    BP 132/86  Pulse 63  Temp 97.9 F (36.6 C) (Oral)  Wt 222 lb 3.2 oz (100.789 kg)  SpO2 96% General appearance: alert, cooperative, appears stated age and no distress Abdomen: normal findings: soft and abnormal findings:  mild tenderness in the lower abdomen Male genitalia: normal Rectal: normal tone, normal prostate, no masses or tenderness  Laboratory:  Urine dipstick: negative for all components.   Micro exam: not done.    Assessment:    urinary frequency with low abd pain     Plan:    Medications: ciprofloxacin. Maintain adequate hydration. Follow up if symptoms not improving, and as needed. Check culture

## 2011-08-17 ENCOUNTER — Encounter: Payer: Self-pay | Admitting: Family Medicine

## 2011-08-17 ENCOUNTER — Other Ambulatory Visit: Payer: Self-pay | Admitting: Family Medicine

## 2011-08-17 DIAGNOSIS — R350 Frequency of micturition: Secondary | ICD-10-CM

## 2011-08-17 DIAGNOSIS — R102 Pelvic and perineal pain: Secondary | ICD-10-CM

## 2011-08-17 LAB — URINE CULTURE: Colony Count: NO GROWTH

## 2011-08-18 ENCOUNTER — Telehealth: Payer: Self-pay | Admitting: Family Medicine

## 2011-08-18 NOTE — Telephone Encounter (Signed)
Caller: Heru/Patient; Phone Number: [phone]; Message from caller: Pt.  Calling to speak with Selena Batten as f/u.  His sx appear to be GI related today (had a bm today and it relieved alot of his sx).  He has taken 3 days of the Cipro and needs to know if he should continue with the Cipro or stop same?

## 2011-08-23 ENCOUNTER — Telehealth: Payer: Self-pay | Admitting: *Deleted

## 2011-08-23 DIAGNOSIS — E119 Type 2 diabetes mellitus without complications: Secondary | ICD-10-CM

## 2011-08-23 DIAGNOSIS — E782 Mixed hyperlipidemia: Secondary | ICD-10-CM

## 2011-08-23 DIAGNOSIS — R6889 Other general symptoms and signs: Secondary | ICD-10-CM

## 2011-08-23 NOTE — Telephone Encounter (Signed)
Transferred call to Referral rep per noted needs copy of recent OV due to in office currently

## 2011-08-23 NOTE — Telephone Encounter (Signed)
Patient called stating Dr. Alanda Amass wants him to have a cmp and lipid panel done. Patient would like to know if Dr. Laury Axon thinks he should have anything else done while he is here. He is coming in on Thursday, 08/25/11. Patient also wanted to let Dr. Laury Axon know that he saw the urologist and is being treated with cipro 250mg  and mobic for 30 days.

## 2011-08-23 NOTE — Telephone Encounter (Signed)
hgb a1c, microalbumin

## 2011-08-23 NOTE — Addendum Note (Signed)
Addended by: Arnette Norris on: 08/23/2011 04:59 PM   Modules accepted: Orders

## 2011-08-23 NOTE — Telephone Encounter (Signed)
Patient called stating Dr.Weintrub wants patient to have a cmp and lipid panel done. Patient would like to know if you would like for him to have any additional labs done at that time. Please advise   KP

## 2011-08-25 ENCOUNTER — Other Ambulatory Visit (INDEPENDENT_AMBULATORY_CARE_PROVIDER_SITE_OTHER): Payer: Medicare Other

## 2011-08-25 DIAGNOSIS — R6889 Other general symptoms and signs: Secondary | ICD-10-CM

## 2011-08-25 DIAGNOSIS — E119 Type 2 diabetes mellitus without complications: Secondary | ICD-10-CM

## 2011-08-25 DIAGNOSIS — E782 Mixed hyperlipidemia: Secondary | ICD-10-CM

## 2011-08-25 LAB — COMPREHENSIVE METABOLIC PANEL
ALT: 36 U/L (ref 0–53)
AST: 41 U/L — ABNORMAL HIGH (ref 0–37)
Albumin: 4.1 g/dL (ref 3.5–5.2)
Alkaline Phosphatase: 41 U/L (ref 39–117)
BUN: 13 mg/dL (ref 6–23)
CO2: 26 mEq/L (ref 19–32)
Calcium: 8.8 mg/dL (ref 8.4–10.5)
Chloride: 103 mEq/L (ref 96–112)
Creatinine, Ser: 1 mg/dL (ref 0.4–1.5)
GFR: 84.03 mL/min (ref >60.00)
Glucose, Bld: 163 mg/dL — ABNORMAL HIGH (ref 70–99)
Potassium: 4.2 mEq/L (ref 3.5–5.1)
Sodium: 137 mEq/L (ref 135–145)
Total Bilirubin: 0.8 mg/dL (ref 0.3–1.2)
Total Protein: 7 g/dL (ref 6.0–8.3)

## 2011-08-25 LAB — LIPID PANEL
HDL: 56.1 mg/dL (ref >39.00)
Total CHOL/HDL Ratio: 2
VLDL: 18 mg/dL (ref 0.0–40.0)

## 2011-08-25 LAB — MICROALBUMIN / CREATININE URINE RATIO
Creatinine,U: 192.9 mg/dL
Microalb, Ur: 0.2 mg/dL (ref 0.0–1.9)

## 2011-08-25 LAB — HEMOGLOBIN A1C: Hgb A1c MFr Bld: 6.6 % — ABNORMAL HIGH (ref 4.6–6.5)

## 2011-08-30 ENCOUNTER — Telehealth: Payer: Self-pay | Admitting: Family Medicine

## 2011-08-30 NOTE — Telephone Encounter (Signed)
Caller: [place]/Patient; PCP: Lelon Perla.; CB#: [phone]; ; ; Call regarding Referral To Urologist; Seen in Office 08/15/11.  Urologist has placed the patient on mobic qd and cipro 250mg  BID for a month.  States was not able to tolerate the mobic, feeling it was causing his abdominal pain,  and stopped it after a week.  Continues to have aching in R side and pelvis where he had symptoms when he was diagnosed with diverticulitis a year ago.  States has not seen significant improvement on cipro.  Urinary symptoms have not improved.  Afebrile.  Pain is constant and rated 4/10, limiting his usual activity.  Per protocol, emergent symptoms denied; advised appt within 24 hours.  Appt sched 08/31/11 0945 with Dr. Beverely Low.

## 2011-08-31 ENCOUNTER — Ambulatory Visit (INDEPENDENT_AMBULATORY_CARE_PROVIDER_SITE_OTHER): Payer: Medicare Other | Admitting: Family Medicine

## 2011-08-31 ENCOUNTER — Encounter: Payer: Self-pay | Admitting: Family Medicine

## 2011-08-31 VITALS — BP 128/78 | HR 80 | Temp 98.0°F | Ht 70.0 in | Wt 225.2 lb

## 2011-08-31 DIAGNOSIS — R109 Unspecified abdominal pain: Secondary | ICD-10-CM

## 2011-08-31 LAB — CBC WITH DIFFERENTIAL/PLATELET
Basophils Absolute: 0 10*3/uL (ref 0.0–0.1)
Basophils Relative: 0.7 % (ref 0.0–3.0)
Eosinophils Absolute: 0.2 10*3/uL (ref 0.0–0.7)
Lymphocytes Relative: 17.6 % (ref 12.0–46.0)
MCHC: 33.8 g/dL (ref 30.0–36.0)
MCV: 92 fl (ref 78.0–100.0)
Monocytes Absolute: 0.5 10*3/uL (ref 0.1–1.0)
Neutrophils Relative %: 71.2 % (ref 43.0–77.0)
Platelets: 158 10*3/uL (ref 150.0–400.0)
RBC: 4.82 Mil/uL (ref 4.22–5.81)
RDW: 12.6 % (ref 11.5–14.6)

## 2011-08-31 LAB — H. PYLORI ANTIBODY, IGG: H Pylori IgG: NEGATIVE

## 2011-08-31 MED ORDER — GI COCKTAIL ~~LOC~~
30.0000 mL | Freq: Once | ORAL | Status: AC
  Administered 2011-08-31: 30 mL via ORAL

## 2011-08-31 MED ORDER — DICYCLOMINE HCL 20 MG PO TABS: 20.0000 mg | ORAL_TABLET | Freq: Four times a day (QID) | ORAL | Status: DC

## 2011-08-31 MED ORDER — ALPRAZOLAM 0.25 MG PO TABS: ORAL_TABLET | ORAL | Status: DC

## 2011-08-31 NOTE — Patient Instructions (Addendum)
Follow up as needed This is most likely irritable bowel w/ increased gas Use the Bentyl as needed for abdominal cramping/spasm Use Gas-X as needed Drink plenty of fluids Continue your fiber intake Exercise as you are able We'll notify you of your lab results Call with any questions or concerns Hang in there!

## 2011-08-31 NOTE — Progress Notes (Signed)
  Subjective:    Patient ID: Bruce Romero, male    DOB: October 02, 1945, 66 y.o.   MRN: [id number]  HPI abd pain- was dx'd w/ diverticulitis 1 yr ago.  Had CT and colonoscopy.  tx'd w/ Cipro at that time.  Now again having 'dull aching pain on L side'.  Seen recently for UTI and tx'd w/ Cipro 250mg  BID x30 days.  cipro isn't helping w/ abd discomfort but bladder and scrotal pain has improved.  + lethargy.  + anorexia.  Mild nausea this AM but last night had good appetite.  No diarrhea.  Pain relieves w/ BM.  No constipation.  + gas/bloating.   Review of Systems For ROS see HPI     Objective:   Physical Exam  Vitals reviewed. Constitutional: He appears well-developed and well-nourished. No distress.  Cardiovascular: Normal rate, regular rhythm and normal heart sounds.   Pulmonary/Chest: Effort normal and breath sounds normal. No respiratory distress. He has no wheezes. He has no rales.  Abdominal: Soft. He exhibits distension. There is no tenderness. There is no rebound and no guarding.       Hyperactive BS          Assessment & Plan:

## 2011-09-13 NOTE — Assessment & Plan Note (Signed)
New to provider.  Pt w/out tenderness/pain consistent w/ diverticulitis.  This also would have improved w/ Cipro.  Suspect that this is more IBS than any infxn.  Check CBC and H pylori to r/o infxn.  Start Bentyl to relieve abd spasm.  Reviewed dx and lifestyle modifications to improve sxs.  Reviewed supportive care and red flags that should prompt return.  Pt expressed understanding and is in agreement w/ plan.

## 2011-09-30 ENCOUNTER — Ambulatory Visit (INDEPENDENT_AMBULATORY_CARE_PROVIDER_SITE_OTHER): Payer: Medicare Other | Admitting: Family Medicine

## 2011-09-30 ENCOUNTER — Encounter: Payer: Self-pay | Admitting: Family Medicine

## 2011-09-30 VITALS — BP 116/76 | HR 61 | Temp 98.2°F | Wt 228.8 lb

## 2011-09-30 DIAGNOSIS — M549 Dorsalgia, unspecified: Secondary | ICD-10-CM

## 2011-09-30 MED ORDER — METAXALONE 800 MG PO TABS: 800.0000 mg | ORAL_TABLET | Freq: Three times a day (TID) | ORAL | Status: AC

## 2011-09-30 NOTE — Patient Instructions (Addendum)
Back Pain, Adult Low back pain is very common. About 1 in 5 people have back pain.The cause of low back pain is rarely dangerous. The pain often gets better over time.About half of people with a sudden onset of back pain feel better in just 2 weeks. About 8 in 10 people feel better by 6 weeks.  CAUSES Some common causes of back pain include:  Strain of the muscles or ligaments supporting the spine.   Wear and tear (degeneration) of the spinal discs.   Arthritis.   Direct injury to the back.  DIAGNOSIS Most of the time, the direct cause of low back pain is not known.However, back pain can be treated effectively even when the exact cause of the pain is unknown.Answering your caregiver's questions about your overall health and symptoms is one of the most accurate ways to make sure the cause of your pain is not dangerous. If your caregiver needs more information, he or she may order lab work or imaging tests (X-rays or MRIs).However, even if imaging tests show changes in your back, this usually does not require surgery. HOME CARE INSTRUCTIONS For many people, back pain returns.Since low back pain is rarely dangerous, it is often a condition that people can learn to manageon their own.   Remain active. It is stressful on the back to sit or stand in one place. Do not sit, drive, or stand in one place for more than 30 minutes at a time. Take short walks on level surfaces as soon as pain allows.Try to increase the length of time you walk each day.   Do not stay in bed.Resting more than 1 or 2 days can delay your recovery.   Do not avoid exercise or work.Your body is made to move.It is not dangerous to be active, even though your back may hurt.Your back will likely heal faster if you return to being active before your pain is gone.   Pay attention to your body when you bend and lift. Many people have less discomfortwhen lifting if they bend their knees, keep the load close to their  bodies,and avoid twisting. Often, the most comfortable positions are those that put less stress on your recovering back.   Find a comfortable position to sleep. Use a firm mattress and lie on your side with your knees slightly bent. If you lie on your back, put a pillow under your knees.   Only take over-the-counter or prescription medicines as directed by your caregiver. Over-the-counter medicines to reduce pain and inflammation are often the most helpful.Your caregiver may prescribe muscle relaxant drugs.These medicines help dull your pain so you can more quickly return to your normal activities and healthy exercise.   Put ice on the injured area.   Put ice in a plastic bag.   Place a towel between your skin and the bag.   Leave the ice on for 15 to 20 minutes, 3 to 4 times a day for the first 2 to 3 days. After that, ice and heat may be alternated to reduce pain and spasms.   Ask your caregiver about trying back exercises and gentle massage. This may be of some benefit.   Avoid feeling anxious or stressed.Stress increases muscle tension and can worsen back pain.It is important to recognize when you are anxious or stressed and learn ways to manage it.Exercise is a great option.  SEEK MEDICAL CARE IF:  You have pain that is not relieved with rest or medicine.   You have   pain that does not improve in 1 week.   You have new symptoms.   You are generally not feeling well.  SEEK IMMEDIATE MEDICAL CARE IF:   You have pain that radiates from your back into your legs.   You develop new bowel or bladder control problems.   You have unusual weakness or numbness in your arms or legs.   You develop nausea or vomiting.   You develop abdominal pain.   You feel faint.  Document Released: 01/24/2005 Document Revised: 01/13/2011 Document Reviewed: 06/14/2010 Exit[employer] Patient Information 2012 [employer]. 

## 2011-09-30 NOTE — Progress Notes (Signed)
  Subjective:    Bruce Romero is a 66 y.o. male who presents for evaluation of low back pain. The patient has had recurrent self limited episodes of low back pain in the past. Symptoms have been present for 2 months and are unchanged.  Onset was related to / precipitated by no known injury. The pain is located in the right sacroiliac area, left sacroiliac area, right gluteal area, left gluteal area or across the lower back and radiates to the left thigh. The pain is described as burning and occurs all day. He rates his pain as moderate. Symptoms are exacerbated by sitting and standing. Symptoms are improved by ice. He has also tried nothing which provided no symptom relief. He has no other symptoms associated with the back pain. The patient has no "red flag" history indicative of complicated back pain.  The following portions of the patient's history were reviewed and updated as appropriate: allergies, current medications, past family history, past medical history, past social history, past surgical history and problem list.  Review of Systems Pertinent items are noted in HPI.    Objective:   Full range of motion without pain, no tenderness, no spasm, no curvature. Normal reflexes, gait, strength and negative straight-leg raise.    Assessment:    Nonspecific acute low back pain    Plan:    Educational material distributed. Stretching exercises discussed. Regular aerobic and trunk strengthening exercises discussed. Short (2-4 day) period of relative rest recommended until acute symptoms improve. Ice to affected area as needed for local pain relief. Heat to affected area as needed for local pain relief. Muscle relaxants per medication orders. Follow-up in 2 weeks.

## 2011-10-03 ENCOUNTER — Other Ambulatory Visit: Payer: Self-pay | Admitting: Family Medicine

## 2011-10-03 MED ORDER — ATORVASTATIN CALCIUM 10 MG PO TABS: 10.0000 mg | ORAL_TABLET | Freq: Every day | ORAL | Status: DC

## 2011-10-03 NOTE — Telephone Encounter (Signed)
Refill LIPITOR 10 MG Take 1 tablet (10 mg total) by mouth daily. Requesting 90-day supply Last wrt 1.18.13 30 no refills Last ov 8.23.13 back pain

## 2011-11-21 ENCOUNTER — Encounter: Payer: Self-pay | Admitting: Family Medicine

## 2011-11-21 ENCOUNTER — Ambulatory Visit (INDEPENDENT_AMBULATORY_CARE_PROVIDER_SITE_OTHER): Payer: Medicare Other | Admitting: Family Medicine

## 2011-11-21 VITALS — BP 130/78 | HR 66 | Temp 98.1°F | Ht 70.0 in | Wt 231.0 lb

## 2011-11-21 DIAGNOSIS — Z23 Encounter for immunization: Secondary | ICD-10-CM

## 2011-11-21 DIAGNOSIS — L0390 Cellulitis, unspecified: Secondary | ICD-10-CM

## 2011-11-21 DIAGNOSIS — L0291 Cutaneous abscess, unspecified: Secondary | ICD-10-CM | POA: Insufficient documentation

## 2011-11-21 MED ORDER — DOXYCYCLINE HYCLATE 100 MG PO TABS: 100.0000 mg | ORAL_TABLET | Freq: Two times a day (BID) | ORAL | Status: DC

## 2011-11-21 NOTE — Patient Instructions (Signed)
Abscess An abscess is an infected area that contains a collection of pus and debris. It can occur in almost any part of the body. An abscess is also known as a furuncle or boil. CAUSES   An abscess occurs when tissue gets infected. This can occur from blockage of oil or sweat glands, infection of hair follicles, or a minor injury to the skin. As the body tries to fight the infection, pus collects in the area and creates pressure under the skin. This pressure causes pain. People with weakened immune systems have difficulty fighting infections and get certain abscesses more often.   SYMPTOMS Usually an abscess develops on the skin and becomes a painful mass that is red, warm, and tender. If the abscess forms under the skin, you may feel a moveable soft area under the skin. Some abscesses break open (rupture) on their own, but most will continue to get worse without care. The infection can spread deeper into the body and eventually into the bloodstream, causing you to feel ill.   DIAGNOSIS   Your caregiver will take your medical history and perform a physical exam. A sample of fluid may also be taken from the abscess to determine what is causing your infection. TREATMENT   Your caregiver may prescribe antibiotic medicines to fight the infection. However, taking antibiotics alone usually does not cure an abscess. Your caregiver may need to make a small cut (incision) in the abscess to drain the pus. In some cases, gauze is packed into the abscess to reduce pain and to continue draining the area. HOME CARE INSTRUCTIONS    Only take over-the-counter or prescription medicines for pain, discomfort, or fever as directed by your caregiver.   If you were prescribed antibiotics, take them as directed. Finish them even if you start to feel better.   If gauze is used, follow your caregiver's directions for changing the gauze.   To avoid spreading the infection:   Keep your draining abscess covered with a  bandage.   Wash your hands well.   Do not share personal care items, towels, or whirlpools with others.   Avoid skin contact with others.   Keep your skin and clothes clean around the abscess.   Keep all follow-up appointments as directed by your caregiver.  SEEK MEDICAL CARE IF:    You have increased pain, swelling, redness, fluid drainage, or bleeding.   You have muscle aches, chills, or a general ill feeling.   You have a fever.  MAKE SURE YOU:    Understand these instructions.   Will watch your condition.   Will get help right away if you are not doing well or get worse.  Document Released: 11/03/2004 Document Revised: 07/26/2011 Document Reviewed: 04/08/2011 [employer] Patient Information 2013 [employer].    

## 2011-11-21 NOTE — Progress Notes (Signed)
  Subjective:    Patient ID: Bruce Romero, male    DOB: [birth year], 66 y.o.   MRN: [id number]  HPI Pt here c/o bump R side abd.  No fevers, no d/c .  No otc.   Review of Systems As above    Objective:   Physical Exam  Nursing note and vitals reviewed. Constitutional: He appears well-developed and well-nourished.  Skin:       + small abscess R side low abd,  No d/c + errythema  And warm to touch  Psychiatric: He has a normal mood and affect. His behavior is normal. Judgment and thought content normal.          Assessment & Plan:

## 2011-11-21 NOTE — Assessment & Plan Note (Signed)
Warm soaks or soak in tub Doxy for 10 days To surgeon if no improvement

## 2011-11-24 ENCOUNTER — Other Ambulatory Visit: Payer: Self-pay

## 2011-11-24 MED ORDER — QUINAPRIL HCL 20 MG PO TABS: 20.0000 mg | ORAL_TABLET | Freq: Every day | ORAL | Status: DC

## 2011-11-24 MED ORDER — SITAGLIP PHOS-METFORMIN HCL ER 100-1000 MG PO TB24: 1.0000 | ORAL_TABLET | ORAL | Status: DC

## 2011-11-24 MED ORDER — NIACIN ER (ANTIHYPERLIPIDEMIC) 1000 MG PO TBCR: 1000.0000 mg | EXTENDED_RELEASE_TABLET | Freq: Every day | ORAL | Status: DC

## 2011-11-24 MED ORDER — ESOMEPRAZOLE MAGNESIUM 40 MG PO CPDR: 40.0000 mg | DELAYED_RELEASE_CAPSULE | Freq: Every day | ORAL | Status: DC

## 2011-11-24 NOTE — Telephone Encounter (Signed)
Rx sent pt aware.   MW  

## 2011-11-28 ENCOUNTER — Telehealth: Payer: Self-pay | Admitting: Family Medicine

## 2011-11-28 ENCOUNTER — Telehealth: Payer: Self-pay

## 2011-11-28 NOTE — Telephone Encounter (Signed)
Caller: Lum/Patient; Patient Name: Bruce Romero; PCP: Lelon Perla.; Best Callback Phone Number: [phone].  Pt reports he was seen in the office on 11/21/11  for abscess on right side.  Pt was prescribed Doxycycline for 10 days.  Pt is on day 8 and the abscess is not improving.  Pt states Dr Laury Axon has mentioned pt would never referral to another MD for lancing of abscess if medication did not clear the abscess up.  The pt originally called for a referral but during the triage decided her would call his own dermatologist first.  Pt states he is going to consult with his dermatologist about lancing the abscess and pt will call back if he needs to office to begin referral process for abscess.

## 2011-11-28 NOTE — Telephone Encounter (Signed)
Spoke to pharmacy janumet direction on Rx was incorrect.  I clarified Rx. Rx done     MW

## 2011-11-28 NOTE — Telephone Encounter (Signed)
Noted  

## 2011-12-11 IMAGING — CT CT ABD-PELV W/ CM
2 of 5 series · 17 of 46 positions shown, 19 images · IV contrast (agent unspecified)
Comparison: None.

CLINICAL DATA: Left-sided abdominal pain.  Diarrhea.

CT ABDOMEN AND PELVIS WITH CONTRAST
TECHNIQUE: Multidetector CT imaging of the abdomen and pelvis was
performed following the standard protocol during bolus
administration of intravenous contrast.
Contrast: 100 ml Rmnipaque-L66 and oral contrast

[Series 2: abd/pelvis 5.0 b31f · axial · 0.85mm/px · z∈[-462,-7]mm · 14 of 103 slices shown, 16 images]
[im 6/103  soft-tissue]
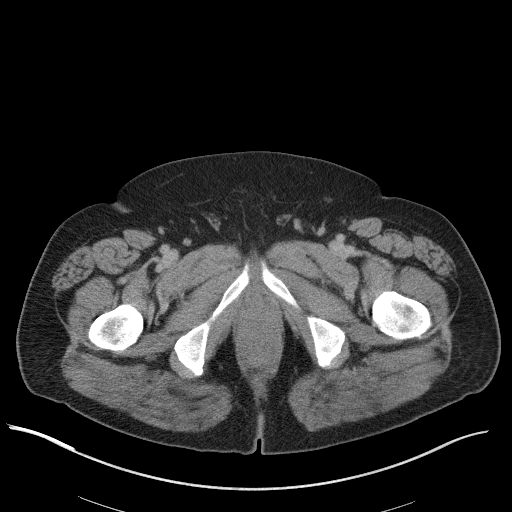
[im 6/103  bone]
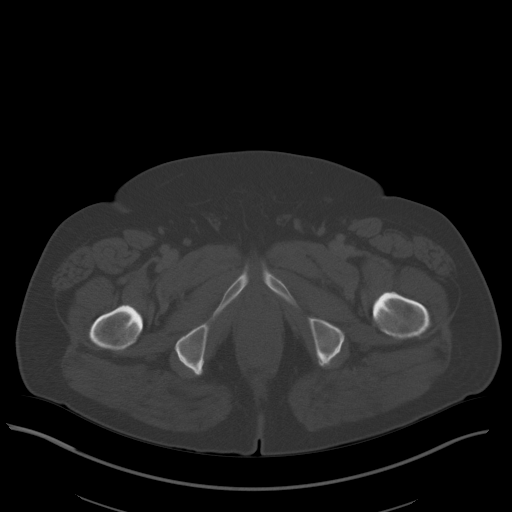
[im 11/103  soft-tissue]
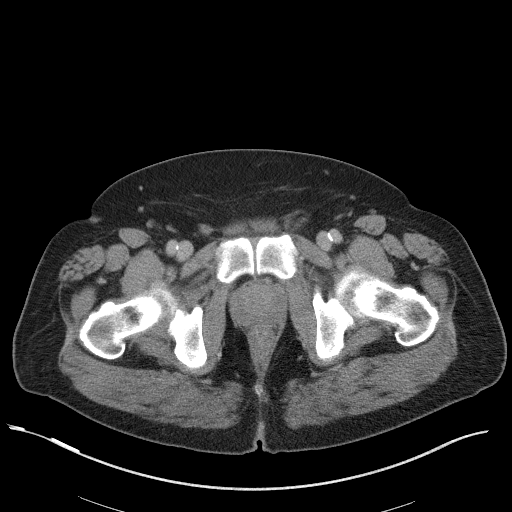
[im 22/103  soft-tissue]
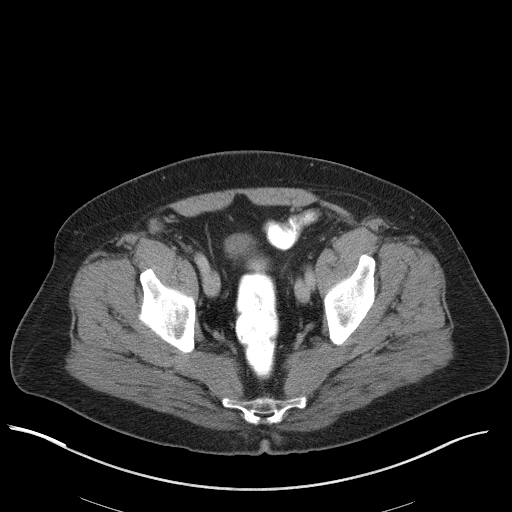
[im 27/103  soft-tissue]
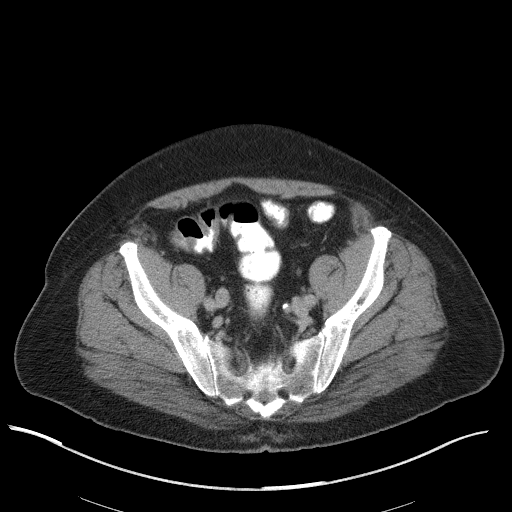
[im 33/103  soft-tissue]
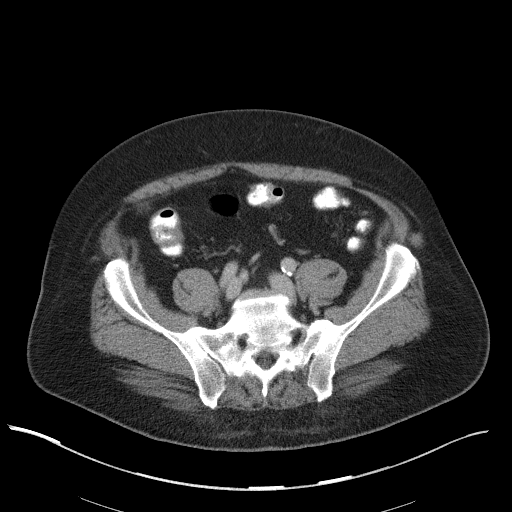
[im 43/103  soft-tissue]
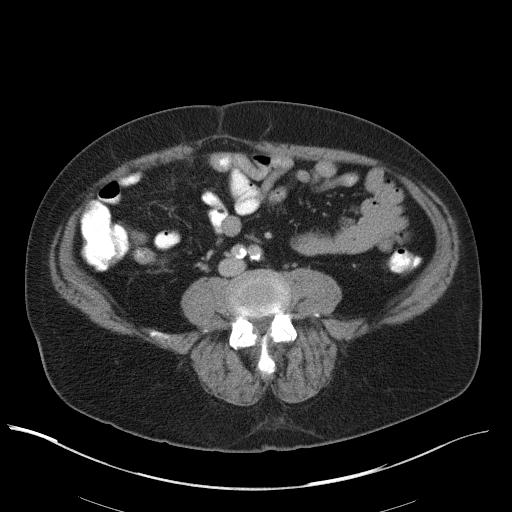
[im 49/103  soft-tissue]
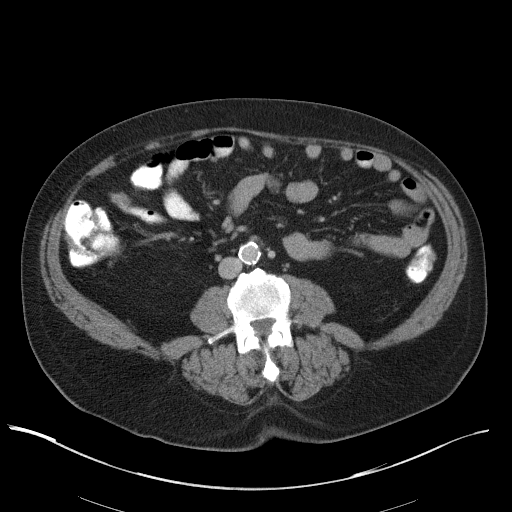
[im 54/103  soft-tissue]
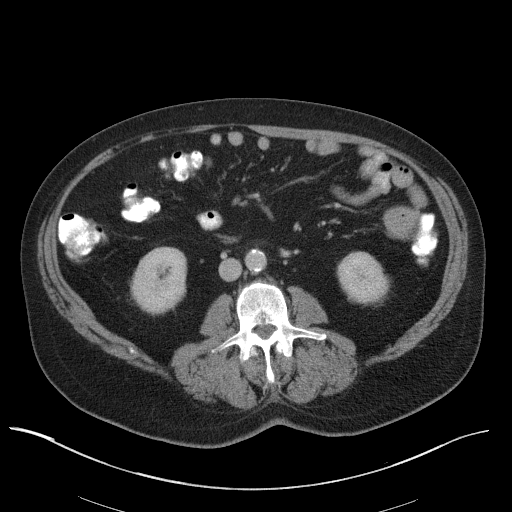
[im 60/103  soft-tissue]
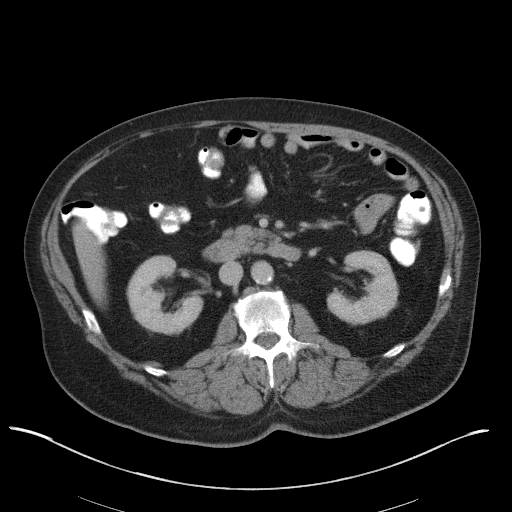
[im 60/103  bone]
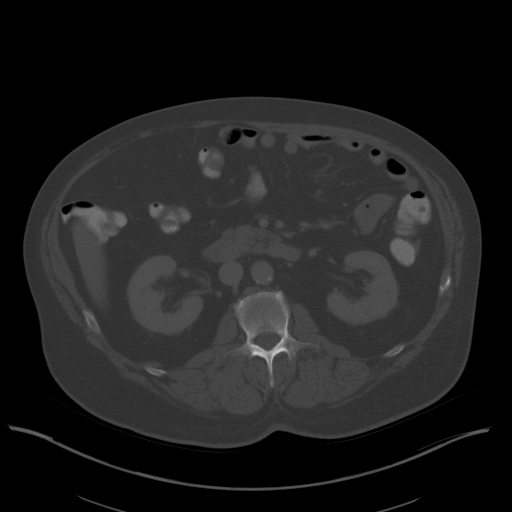
[im 70/103  soft-tissue]
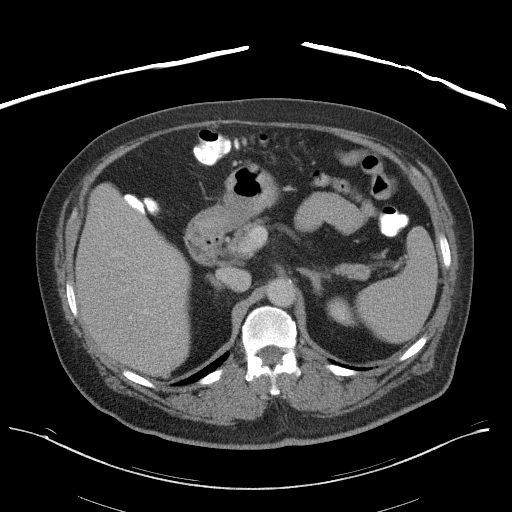
[im 76/103  soft-tissue]
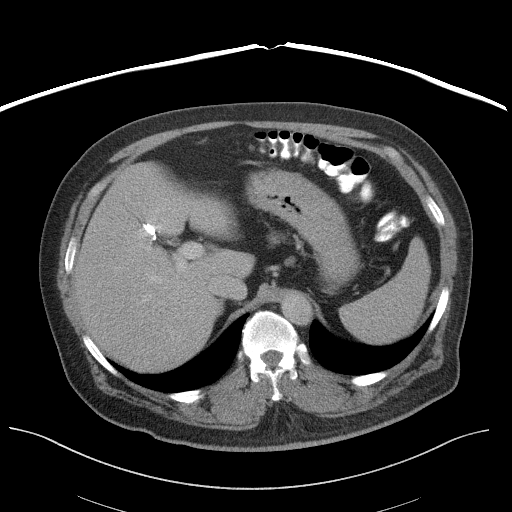
[im 81/103  soft-tissue]
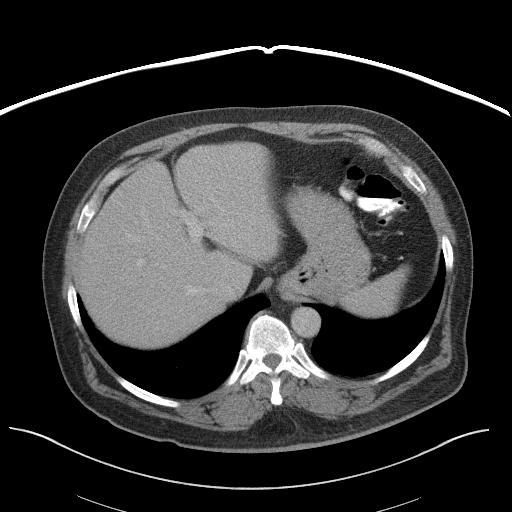
[im 92/103  soft-tissue]
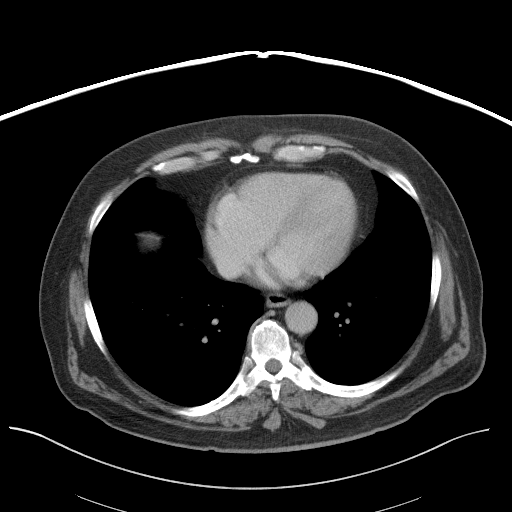
[im 97/103  soft-tissue]
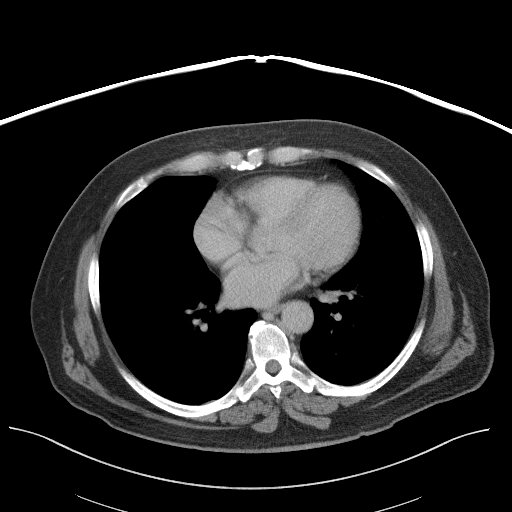

[Series 5: abd/pelvis 3.0 coronal · coronal · 0.88mm/px · 3 of 96 slices shown]
[im 32/96  soft-tissue]
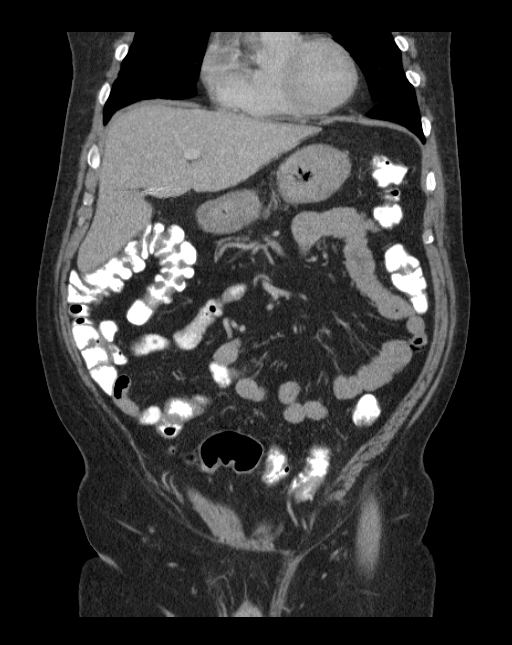
[im 43/96  soft-tissue]
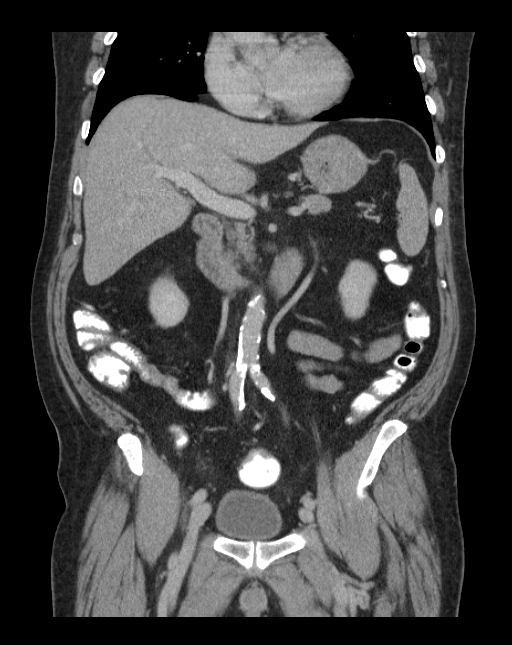
[im 53/96  soft-tissue]
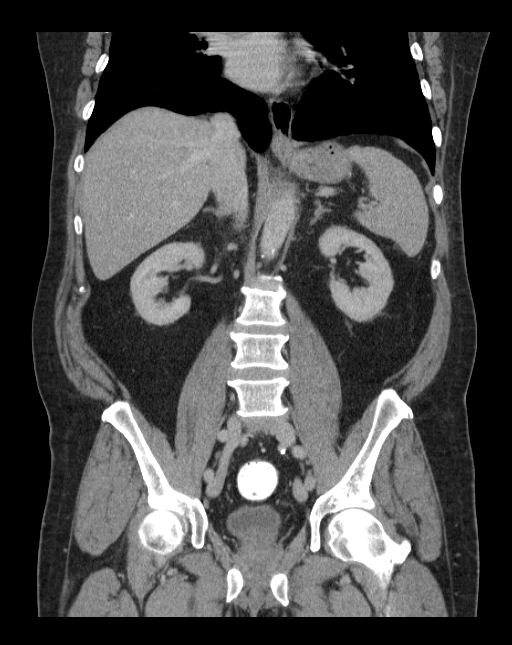

[17 of 46 positions shown; findings below may reference images not displayed]

FINDINGS: Surgical clips seen from prior cholecystectomy.  The
abdominal parenchymal organs are normal in appearance.  No evidence
of biliary dilatation or hydronephrosis.

No soft tissue masses or lymphadenopathy identified within the
abdomen or pelvis.  There is no evidence of inflammatory process or
abnormal fluid collections.  No evidence of bowel wall thickening
or dilatation.
IMPRESSION: Negative.  No acute findings or other significant abnormality.

## 2011-12-12 ENCOUNTER — Ambulatory Visit (INDEPENDENT_AMBULATORY_CARE_PROVIDER_SITE_OTHER): Payer: Medicare Other | Admitting: Family Medicine

## 2011-12-12 ENCOUNTER — Encounter: Payer: Self-pay | Admitting: Family Medicine

## 2011-12-12 VITALS — BP 122/74 | HR 61 | Temp 98.3°F | Wt 228.0 lb

## 2011-12-12 DIAGNOSIS — J329 Chronic sinusitis, unspecified: Secondary | ICD-10-CM

## 2011-12-12 MED ORDER — AZELASTINE-FLUTICASONE 137-50 MCG/ACT NA SUSP: 1.0000 | Freq: Two times a day (BID) | NASAL | Status: DC

## 2011-12-12 MED ORDER — AMOXICILLIN-POT CLAVULANATE 875-125 MG PO TABS: 1.0000 | ORAL_TABLET | Freq: Two times a day (BID) | ORAL | Status: DC

## 2011-12-12 NOTE — Progress Notes (Signed)
  Subjective:     Bruce Romero is a 66 y.o. male who presents for evaluation of sinus pain. Symptoms include: congestion, cough, facial pain, nasal congestion and sinus pressure. Onset of symptoms was 2 weeks ago. Symptoms have been gradually worsening since that time. Past history is significant for no history of pneumonia or bronchitis. Patient is a non-smoker.  Pt has been on doxy for 3 weeks for mrsa---2 days left.  He is taking allegra.  The following portions of the patient's history were reviewed and updated as appropriate: allergies, current medications, past family history, past medical history, past social history, past surgical history and problem list.  Review of Systems Pertinent items are noted in HPI.   Objective:    BP 122/74  Pulse 61  Temp 98.3 F (36.8 C) (Oral)  Wt 228 lb (103.42 kg)  SpO2 96% General appearance: alert, cooperative, appears stated age and no distress Ears: R tm--+ fluid,  L tm normal Nose: green discharge, moderate congestion, turbinates red, swollen, sinus tenderness bilateral Throat: abnormal findings: mild oropharyngeal erythema and PND Neck: mild anterior cervical adenopathy, supple, symmetrical, trachea midline and thyroid not enlarged, symmetric, no tenderness/mass/nodules Lungs: clear to auscultation bilaterally Extremities: extremities normal, atraumatic, no cyanosis or edema    Assessment:    Acute bacterial sinusitis.    Plan:    Nasal steroids per medication orders. Antihistamines per medication orders. Augmentin per medication orders. f/u prn

## 2011-12-12 NOTE — Patient Instructions (Signed)

## 2012-02-13 ENCOUNTER — Ambulatory Visit (INDEPENDENT_AMBULATORY_CARE_PROVIDER_SITE_OTHER): Payer: Medicare Other | Admitting: Family Medicine

## 2012-02-13 ENCOUNTER — Encounter: Payer: Self-pay | Admitting: Family Medicine

## 2012-02-13 VITALS — BP 122/80 | HR 75 | Temp 98.2°F | Wt 233.2 lb

## 2012-02-13 DIAGNOSIS — J9801 Acute bronchospasm: Secondary | ICD-10-CM

## 2012-02-13 DIAGNOSIS — E785 Hyperlipidemia, unspecified: Secondary | ICD-10-CM

## 2012-02-13 MED ORDER — PREDNISONE 10 MG PO TABS: ORAL_TABLET | ORAL | Status: DC

## 2012-02-13 MED ORDER — ALBUTEROL SULFATE HFA 108 (90 BASE) MCG/ACT IN AERS
2.0000 | INHALATION_SPRAY | Freq: Four times a day (QID) | RESPIRATORY_TRACT | Status: DC | PRN
Start: 2012-02-13 — End: 2014-05-15

## 2012-02-13 NOTE — Patient Instructions (Signed)
Bronchospasm, Adult  Bronchospasm means that there is a spasm or tightening of the airways going into the lungs. Because the airways go into a spasm and get smaller it makes breathing more difficult.  For reasons not completely known, workings (functions) of the airways designed to protect the lungs become over active. This causes the airways to become more sensitive to:   Infection.   Weather.   Exercise.   Irritants.   Things that cause allergic reactions or allergies (allergens).  Frequent coughing or respiratory episodes should be checked for the cause. This condition may be made worse by exercise.  CAUSES   Inflammation is often the cause of this condition. Allergy, viral respiratory infections, or irritants in the air often cause this problem. Allergic reactions produce immediate and delayed responses. Late reactions may produce more serious inflammation. This may lead to increased reactivity of the airways. Sometimes this is inherited.  Some common triggers are:   Allergies.   Infection commonly triggers attacks. Antibiotics are not helpful for viral infections and usually do not help with attacks of bronchospasm.   Exercise (running, etc.) can trigger an attack. Proper pre-exercise medications help most individuals participate in sports. Swimming is the least likely sport to cause problems.   Irritants (for example, pollution, cigarette smoke, strong odors, aerosol sprays, paint fumes, etc.) may trigger attacks. You cannot smoke and do not allow smoking in your home. This is absolutely necessary. Show this instruction to mates, relatives and significant others that may not agree with you.   Weather changes may cause lung problems but moving around trying to find an ideal climate does not seem to be overly helpful. Winds increase molds and pollens in the air. Rain refreshes the air by washing irritants out. Cold air may cause irritation.   Emotional problems do not cause lung problems but can  trigger attacks.  SYMPTOMS   Wheezing is the most common symptom. Frequent coughing (with or without exercise and or crying) and repeated respiratory infections are all early warning signs of bronchospasm. Chest tightness and shortness of breath are other symptoms.  DIAGNOSIS   Early hidden bronchospasm may go for long periods of time without being detected. This is especially true if wheezing cannot be detected by your caregiver. Lung (pulmonary) function studies may help with diagnosis in these cases.  HOME CARE INSTRUCTIONS    It is necessary to remain calm during an attack. Try to relax and breathe more slowly. During this time medications may be given. If any breathing problems seem to be getting worse and are unresponsive to treatment seek immediate medical care.   If you have severe breathing difficulty or have had a life threatening attack it is probably a good idea for you to learn how to give adrenaline (epi-pen) or use an anaphylaxis kit. Your caregiver can help you with this. These are the same kits carried by people who have severe allergic reactions. This is especially important if you do not have readily accessible medical care.   With any severe breathing problems where epinephrine (adrenaline) has been given at home call 911 immediately as the delayed reaction may be even more severe.  SEEK MEDICAL CARE IF:    There is wheezing and shortness of breath, even if medications are given to prevent attacks.   An oral temperature above 102 F (38.9 C) develops.   There are muscle aches, chest pain, or thickening of sputum.   The sputum changes from clear or white to yellow, green,   gray, or bloody.   There are problems that may be related to the medicine you are given, such as a rash, itching, swelling, or trouble breathing.  SEEK IMMEDIATE MEDICAL CARE IF:    The usual medicines do not stop your wheezing, or there is increased coughing.   You have increased difficulty breathing.  MAKE SURE YOU:     Understand these instructions.   Will watch your condition.   Will get help right away if you are not doing well or get worse.  Document Released: 01/27/2003 Document Revised: 04/18/2011 Document Reviewed: 09/12/2007  [employer] Patient Information 2013 [employer].

## 2012-02-13 NOTE — Progress Notes (Signed)
  Subjective:     Bruce Romero is a 67 y.o. male who presents for evaluation of symptoms of a URI. Symptoms include congestion, nasal congestion, non productive cough, post nasal drip, purulent nasal discharge and wheezing. Onset of symptoms was 10 days ago, and has been gradually worsening since that time. Treatment to date: antibiotics and cough suppressants.  The following portions of the patient's history were reviewed and updated as appropriate: allergies, current medications, past family history, past medical history, past social history, past surgical history and problem list.  Review of Systems Pertinent items are noted in HPI.   Objective:    BP 122/80  Pulse 75  Temp 98.2 F (36.8 C) (Other (Comment))  Wt 233 lb 3.2 oz (105.779 kg)  SpO2 96% General appearance: alert, cooperative, appears stated age and no distress Ears: normal TM's and external ear canals both ears Nose: clear discharge, mild congestion, turbinates red, swollen, no sinus tenderness Throat: lips, mucosa, and tongue normal; teeth and gums normal Neck: no adenopathy, supple, symmetrical, trachea midline and thyroid not enlarged, symmetric, no tenderness/mass/nodules Lungs: wheezes LLL Heart: S1, S2 normal   Assessment:  viral  bronchitis -- with bronchospasm  Plan:    Suggested symptomatic OTC remedies. Nasal saline spray for congestion. Follow up as needed. f/u prn  pred taper  proair and dulera con't reg meds

## 2012-02-14 ENCOUNTER — Telehealth: Payer: Self-pay | Admitting: Family Medicine

## 2012-02-14 DIAGNOSIS — J329 Chronic sinusitis, unspecified: Secondary | ICD-10-CM

## 2012-02-14 MED ORDER — BECLOMETHASONE DIPROPIONATE 80 MCG/ACT NA AERS: 2.0000 | INHALATION_SPRAY | Freq: Every day | NASAL | Status: DC

## 2012-02-14 MED ORDER — MOMETASONE FURO-FORMOTEROL FUM 100-5 MCG/ACT IN AERO: 2.0000 | INHALATION_SPRAY | Freq: Two times a day (BID) | RESPIRATORY_TRACT | Status: DC

## 2012-02-14 NOTE — Telephone Encounter (Signed)
Ok to refill dulera for 6 months And qnasl  #3 with 3 refills

## 2012-02-14 NOTE — Telephone Encounter (Signed)
Pt states he was in the office on 02/13/12 and was treated for bronchospasm.  Pt thought he still had samples of Dulera but they are all expired.  Pt is calling to see if Dr Laury Axon will write new RX for the [facility] Inhaler and have it sent to [facility] off [address]  Pt is also requesting a 90 day RX be sent to the [facility] for his Qnasal.  OFFICE PLEASE FOLLOW UP WITH PATIENT REGARDING THE DULERA AND QNASAL

## 2012-02-14 NOTE — Telephone Encounter (Signed)
Please advise      KP 

## 2012-02-22 ENCOUNTER — Other Ambulatory Visit (INDEPENDENT_AMBULATORY_CARE_PROVIDER_SITE_OTHER): Payer: Medicare Other

## 2012-02-22 DIAGNOSIS — E785 Hyperlipidemia, unspecified: Secondary | ICD-10-CM

## 2012-02-22 LAB — HEPATIC FUNCTION PANEL
Bilirubin, Direct: 0.1 mg/dL (ref 0.0–0.3)
Total Bilirubin: 0.9 mg/dL (ref 0.3–1.2)
Total Protein: 7.2 g/dL (ref 6.0–8.3)

## 2012-02-22 LAB — BASIC METABOLIC PANEL
BUN: 19 mg/dL (ref 6–23)
Chloride: 99 mEq/L (ref 96–112)
Glucose, Bld: 141 mg/dL — ABNORMAL HIGH (ref 70–99)
Potassium: 3.9 mEq/L (ref 3.5–5.1)

## 2012-02-22 LAB — LIPID PANEL
LDL Cholesterol: 45 mg/dL (ref 0–99)
VLDL: 23.6 mg/dL (ref 0.0–40.0)

## 2012-02-23 ENCOUNTER — Telehealth: Payer: Self-pay | Admitting: *Deleted

## 2012-02-23 MED ORDER — MOMETASONE FUROATE 50 MCG/ACT NA SUSP: 2.0000 | Freq: Every day | NASAL | Status: DC

## 2012-02-23 NOTE — Telephone Encounter (Signed)
nasonex #3  2 sprays each nostril qd ,  5 refills

## 2012-02-23 NOTE — Telephone Encounter (Signed)
Pt left msg stating that his insurance does not cover the qnasl that was sent into [facility]. Pt states that the generic nasonex is covered & would like a prescription called into [facility]. Please advise.

## 2012-03-04 ENCOUNTER — Encounter: Payer: Self-pay | Admitting: Family Medicine

## 2012-03-05 ENCOUNTER — Other Ambulatory Visit: Payer: Self-pay | Admitting: Family Medicine

## 2012-03-05 ENCOUNTER — Other Ambulatory Visit (HOSPITAL_COMMUNITY): Payer: Self-pay | Admitting: Cardiovascular Disease

## 2012-03-05 DIAGNOSIS — I10 Essential (primary) hypertension: Secondary | ICD-10-CM

## 2012-03-05 DIAGNOSIS — M797 Fibromyalgia: Secondary | ICD-10-CM

## 2012-03-05 DIAGNOSIS — R0989 Other specified symptoms and signs involving the circulatory and respiratory systems: Secondary | ICD-10-CM

## 2012-03-05 DIAGNOSIS — R011 Cardiac murmur, unspecified: Secondary | ICD-10-CM

## 2012-03-11 ENCOUNTER — Encounter: Payer: Self-pay | Admitting: Family Medicine

## 2012-03-20 ENCOUNTER — Other Ambulatory Visit (INDEPENDENT_AMBULATORY_CARE_PROVIDER_SITE_OTHER): Payer: Medicare Other

## 2012-03-20 DIAGNOSIS — M797 Fibromyalgia: Secondary | ICD-10-CM

## 2012-03-20 DIAGNOSIS — E1059 Type 1 diabetes mellitus with other circulatory complications: Secondary | ICD-10-CM

## 2012-03-20 DIAGNOSIS — IMO0001 Reserved for inherently not codable concepts without codable children: Secondary | ICD-10-CM

## 2012-03-20 LAB — BASIC METABOLIC PANEL
BUN: 16 mg/dL (ref 6–23)
Chloride: 100 mEq/L (ref 96–112)
Glucose, Bld: 136 mg/dL — ABNORMAL HIGH (ref 70–99)
Potassium: 3.8 mEq/L (ref 3.5–5.1)

## 2012-03-20 LAB — MAGNESIUM: Magnesium: 1.7 mg/dL (ref 1.5–2.5)

## 2012-03-26 ENCOUNTER — Ambulatory Visit (HOSPITAL_COMMUNITY)
Admission: RE | Admit: 2012-03-26 | Discharge: 2012-03-26 | Disposition: A | Discharge status: Home / Self Care | Payer: Medicare Other | Source: Ambulatory Visit | Attending: Cardiovascular Disease | Admitting: Cardiovascular Disease

## 2012-03-26 DIAGNOSIS — R011 Cardiac murmur, unspecified: Secondary | ICD-10-CM

## 2012-03-26 DIAGNOSIS — I10 Essential (primary) hypertension: Secondary | ICD-10-CM

## 2012-03-26 DIAGNOSIS — R0989 Other specified symptoms and signs involving the circulatory and respiratory systems: Secondary | ICD-10-CM | POA: Insufficient documentation

## 2012-03-26 NOTE — Progress Notes (Signed)
2D Echo Performed 03/26/2012    Clearence Ped, RCS

## 2012-03-26 NOTE — Progress Notes (Signed)
Carotid Duplex Complete Bruce Romero 

## 2012-04-01 ENCOUNTER — Encounter: Payer: Self-pay | Admitting: Family Medicine

## 2012-04-02 MED ORDER — GLUCOSE BLOOD VI STRP: ORAL_STRIP | Status: DC

## 2012-04-19 ENCOUNTER — Telehealth: Payer: Self-pay | Admitting: Family Medicine

## 2012-04-19 MED ORDER — ATORVASTATIN CALCIUM 10 MG PO TABS: 10.0000 mg | ORAL_TABLET | Freq: Every day | ORAL | Status: DC

## 2012-04-19 MED ORDER — GLIMEPIRIDE 4 MG PO TABS: 4.0000 mg | ORAL_TABLET | Freq: Every day | ORAL | Status: DC

## 2012-04-19 MED ORDER — SITAGLIP PHOS-METFORMIN HCL ER 100-1000 MG PO TB24: 1.0000 | ORAL_TABLET | ORAL | Status: DC

## 2012-04-19 NOTE — Telephone Encounter (Signed)
Refills x 3 1-Janumet XR Tab [id number] Take 1 tablet daily no qty or last fill date listed last wrt 10.17.13 #90 x 1  2-Lipitor Tab 10mg  Take 1 tablet daily no qty or last fill date listed last wrt 8.26.13 #90 x 1  3-glimepiride tab 4mg  take 1 tablet daily no qty or last fill date listed last wrt 5.28.13 #90 x 1

## 2012-04-20 ENCOUNTER — Telehealth: Payer: Self-pay | Admitting: *Deleted

## 2012-04-20 NOTE — Telephone Encounter (Signed)
Received a call from [facility] needing clarification on Janumet XR. Clarififed that this is to be taken as 1 tab once daily.

## 2012-05-10 ENCOUNTER — Telehealth: Payer: Self-pay | Admitting: Family Medicine

## 2012-05-10 MED ORDER — QUINAPRIL HCL 20 MG PO TABS: 20.0000 mg | ORAL_TABLET | Freq: Every day | ORAL | Status: DC

## 2012-05-10 MED ORDER — NIACIN ER (ANTIHYPERLIPIDEMIC) 1000 MG PO TBCR: 1000.0000 mg | EXTENDED_RELEASE_TABLET | Freq: Every day | ORAL | Status: DC

## 2012-05-10 NOTE — Telephone Encounter (Signed)
Refill: Quinapril tab 20 mg. Take 1 tablet at bedtime. 90 day supply Niaspan er tab 1000mg . Take 1 tablet at bedtime. 90 day supply

## 2012-05-16 ENCOUNTER — Encounter: Payer: Self-pay | Admitting: Lab

## 2012-05-16 ENCOUNTER — Ambulatory Visit (INDEPENDENT_AMBULATORY_CARE_PROVIDER_SITE_OTHER): Payer: Medicare Other | Admitting: Family Medicine

## 2012-05-16 ENCOUNTER — Encounter: Payer: Self-pay | Admitting: Family Medicine

## 2012-05-16 VITALS — BP 120/70 | HR 62 | Temp 98.1°F | Ht 69.25 in | Wt 223.2 lb

## 2012-05-16 DIAGNOSIS — J019 Acute sinusitis, unspecified: Secondary | ICD-10-CM

## 2012-05-16 DIAGNOSIS — J329 Chronic sinusitis, unspecified: Secondary | ICD-10-CM

## 2012-05-16 MED ORDER — AMOXICILLIN-POT CLAVULANATE 875-125 MG PO TABS: 1.0000 | ORAL_TABLET | Freq: Two times a day (BID) | ORAL | Status: DC

## 2012-05-16 MED ORDER — GUAIFENESIN-CODEINE 100-10 MG/5ML PO SYRP: 10.0000 mL | ORAL_SOLUTION | Freq: Three times a day (TID) | ORAL | Status: DC | PRN

## 2012-05-16 NOTE — Progress Notes (Signed)
  Subjective:    Patient ID: Bruce Romero, male    DOB: 1945/12/15, 67 y.o.   MRN: [id number]  HPI URI- 'my usual sinus issues'.  sxs started 1 week ago w/ sore throat, nasal congestion, hoarseness.  Mild cough- intermittently productive, particularly when lying down.  + maxillary pain.  No fever.  + sick contacts.  Hx of seasonal allergies, on Allegra.   Review of Systems For ROS see HPI     Objective:   Physical Exam  Vitals reviewed. Constitutional: He appears well-developed and well-nourished. No distress.  HENT:  Head: Normocephalic and atraumatic.  Right Ear: Tympanic membrane normal.  Left Ear: Tympanic membrane normal.  Nose: Mucosal edema and rhinorrhea present. Right sinus exhibits maxillary sinus tenderness. Right sinus exhibits no frontal sinus tenderness. Left sinus exhibits maxillary sinus tenderness. Left sinus exhibits no frontal sinus tenderness.  Mouth/Throat: Mucous membranes are normal. Oropharyngeal exudate and posterior oropharyngeal erythema present. No posterior oropharyngeal edema.  + PND  Eyes: Conjunctivae and EOM are normal. Pupils are equal, round, and reactive to light.  Neck: Normal range of motion. Neck supple.  Cardiovascular: Normal rate, regular rhythm and normal heart sounds.   Pulmonary/Chest: Effort normal and breath sounds normal. No respiratory distress. He has no wheezes.  + hacking cough  Lymphadenopathy:    He has no cervical adenopathy.  Skin: Skin is warm and dry.          Assessment & Plan:

## 2012-05-16 NOTE — Addendum Note (Signed)
Addended by: Candie Echevaria L on: 05/16/2012 03:26 PM   Modules accepted: Orders

## 2012-05-16 NOTE — Patient Instructions (Addendum)
This is a sinus infection Start the augmentin twice daily- take w/ food Use the cough syrup as needed Drink plenty of fluids REST! Hang in there!

## 2012-05-16 NOTE — Assessment & Plan Note (Signed)
Pt's sxs and PE consistent w/ infxn.  Start abx.  Reviewed supportive care and red flags that should prompt return.  Pt expressed understanding and is in agreement w/ plan.  

## 2012-06-19 ENCOUNTER — Other Ambulatory Visit: Payer: Self-pay | Admitting: *Deleted

## 2012-06-19 MED ORDER — AMLODIPINE BESYLATE 10 MG PO TABS: 10.0000 mg | ORAL_TABLET | Freq: Every day | ORAL | Status: DC

## 2012-06-19 NOTE — Telephone Encounter (Signed)
Rx sent 

## 2012-07-30 IMAGING — CR DG CHEST 2V
2 series · 2 of 2 positions shown · non-contrast
Comparison: Two-view chest x-ray 12/16/2009 [REDACTED].

CLINICAL DATA: Hypertension.  Tachycardia.  Diabetes.

CHEST - 2 VIEW 12/29/2010:

[w chest pa]
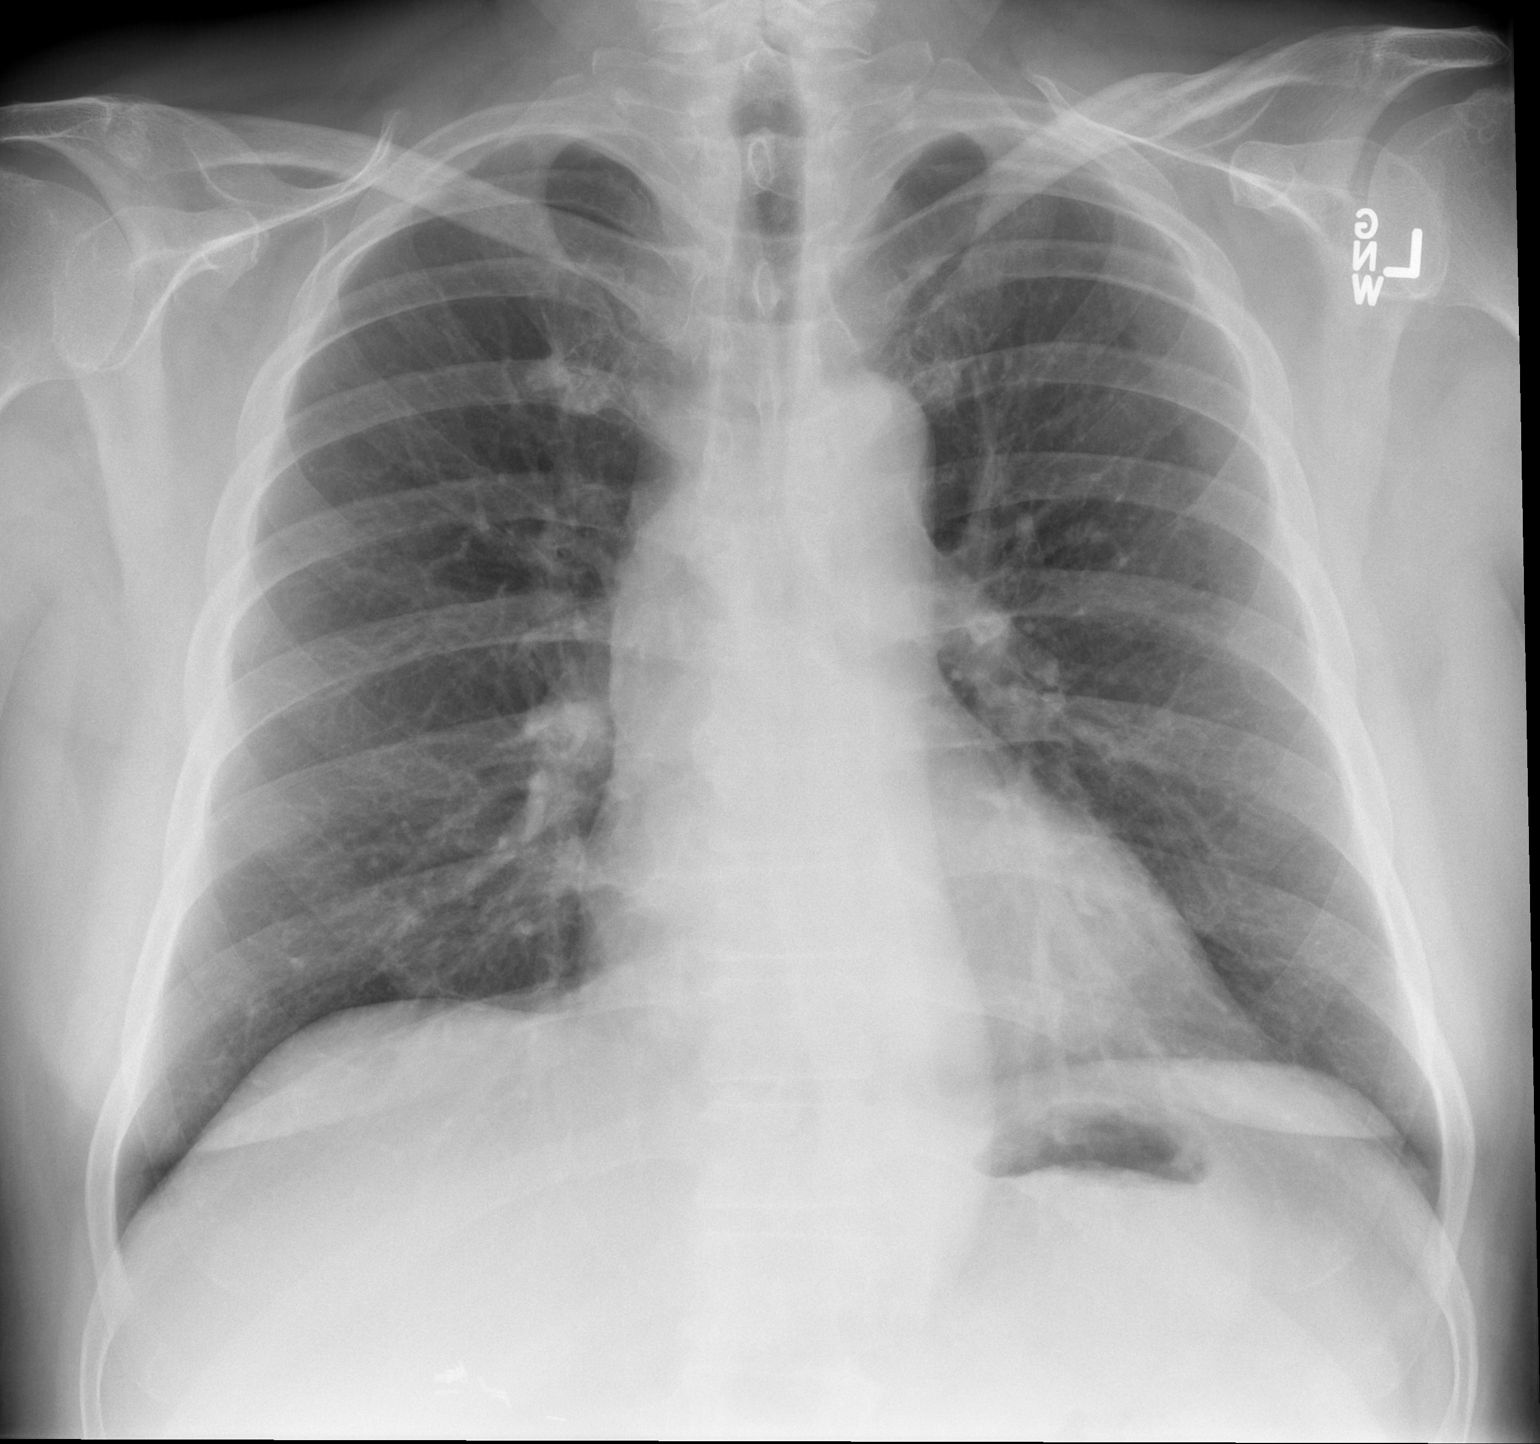

[w chest lat]
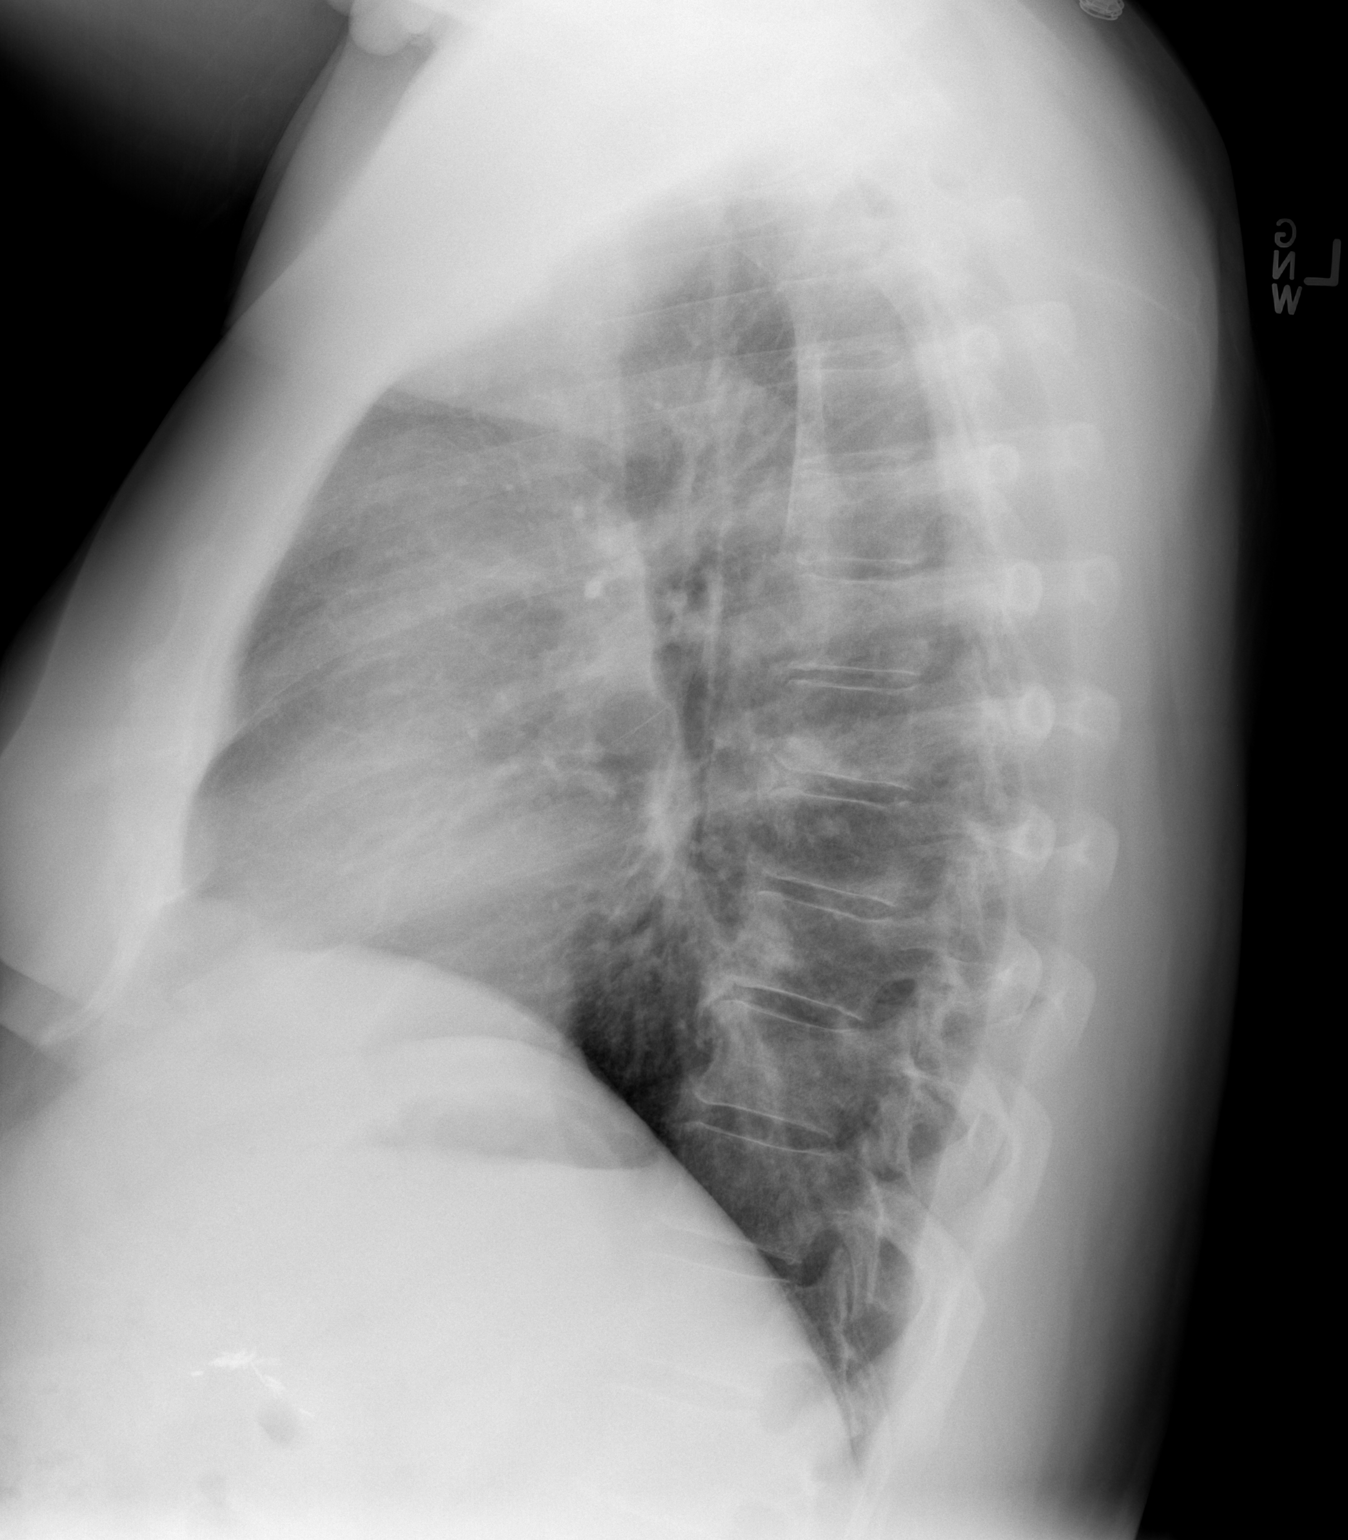

[2 of 2 positions shown; findings below may reference images not displayed]

FINDINGS: Cardiac silhouette normal in size.  Thoracic aorta mildly
tortuous, unchanged.  Hilar and mediastinal contours otherwise
unremarkable.  Lungs clear.  Bronchovascular markings normal.  No
pleural effusions.  Degenerative changes involving the thoracic
spine.  No significant interval change.
IMPRESSION: No acute cardiopulmonary disease.  Stable examination.

## 2012-08-16 ENCOUNTER — Encounter: Payer: Self-pay | Admitting: Family Medicine

## 2012-08-16 MED ORDER — ESOMEPRAZOLE MAGNESIUM 40 MG PO CPDR: 40.0000 mg | DELAYED_RELEASE_CAPSULE | Freq: Every day | ORAL | Status: DC

## 2012-08-25 ENCOUNTER — Encounter: Payer: Self-pay | Admitting: Family Medicine

## 2012-08-27 MED ORDER — ATORVASTATIN CALCIUM 10 MG PO TABS: 10.0000 mg | ORAL_TABLET | Freq: Every day | ORAL | Status: DC

## 2012-08-27 MED ORDER — QUINAPRIL HCL 20 MG PO TABS: 20.0000 mg | ORAL_TABLET | Freq: Every day | ORAL | Status: DC

## 2012-08-27 MED ORDER — NIACIN ER (ANTIHYPERLIPIDEMIC) 1000 MG PO TBCR: 1000.0000 mg | EXTENDED_RELEASE_TABLET | Freq: Every day | ORAL | Status: DC

## 2012-09-01 ENCOUNTER — Other Ambulatory Visit: Payer: Self-pay | Admitting: Family Medicine

## 2012-09-03 NOTE — Telephone Encounter (Signed)
Last refill:10-03-11 Last OV:08-31-11- this was the last time was seen for abd pain Please advise./AB/CMA

## 2012-10-22 ENCOUNTER — Other Ambulatory Visit: Payer: Self-pay | Admitting: *Deleted

## 2012-10-22 MED ORDER — GLIMEPIRIDE 4 MG PO TABS: 4.0000 mg | ORAL_TABLET | Freq: Every day | ORAL | Status: DC

## 2012-10-22 NOTE — Telephone Encounter (Signed)
Rx refilled for glimepiride 4mg .  Ag cma

## 2012-10-23 ENCOUNTER — Other Ambulatory Visit: Payer: Self-pay | Admitting: *Deleted

## 2012-10-23 MED ORDER — GLIMEPIRIDE 4 MG PO TABS: 4.0000 mg | ORAL_TABLET | Freq: Every day | ORAL | Status: DC

## 2012-11-29 ENCOUNTER — Ambulatory Visit (INDEPENDENT_AMBULATORY_CARE_PROVIDER_SITE_OTHER): Payer: Medicare Other | Admitting: Physician Assistant

## 2012-11-29 ENCOUNTER — Encounter: Payer: Self-pay | Admitting: Physician Assistant

## 2012-11-29 VITALS — BP 126/72 | HR 72 | Temp 98.2°F | Resp 16 | Ht 69.25 in | Wt 222.0 lb

## 2012-11-29 DIAGNOSIS — Z23 Encounter for immunization: Secondary | ICD-10-CM | POA: Insufficient documentation

## 2012-11-29 DIAGNOSIS — J019 Acute sinusitis, unspecified: Secondary | ICD-10-CM

## 2012-11-29 MED ORDER — AMOXICILLIN-POT CLAVULANATE 875-125 MG PO TABS: 1.0000 | ORAL_TABLET | Freq: Two times a day (BID) | ORAL | Status: DC

## 2012-11-29 NOTE — Assessment & Plan Note (Signed)
Flu shot given by nursing staff. 

## 2012-11-29 NOTE — Progress Notes (Signed)
Patient ID: Bruce Romero, male   DOB: September 07, 1945, 67 y.o.   MRN: [id number]  Patient presents to clinic today c/o sinus congestion, post-nasal drip, right ear pressure and non-productive over the past several days.  Patient denies fever, chills, malaise.  Denies tooth pain.  Denies recent travel, sick contact, or new pet.  Denies shortness of breath or wheezing.  Has history of allergies.  Takes medications as prescribed.  Has tried Mucinex with mild relief of symptoms.   Past Medical History  Diagnosis Date  . 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 Diabetes mellitus   . Hyperlipidemia   . Hypertension     Current Outpatient Prescriptions on File Prior to Visit  Medication Sig Dispense Refill  . albuterol (PROAIR HFA) 108 (90 BASE) MCG/ACT inhaler Inhale 2 puffs into the lungs every 6 (six) hours as needed for wheezing.  1 Inhaler  2  . ALPRAZolam (XANAX) 0.25 MG tablet 1 po tid prn  60 tablet  0  . amLODipine (NORVASC) 10 MG tablet Take 1 tablet (10 mg total) by mouth daily.  90 tablet  1  . atorvastatin (LIPITOR) 10 MG tablet Take 1 tablet (10 mg total) by mouth daily.  90 tablet  1  . Cholecalciferol (VITAMIN D) 2000 UNITS tablet Take 2,000 Units by mouth daily.        Marland Kitchen dicyclomine (BENTYL) 20 MG tablet take 1 tablet by mouth every 6 hours  60 tablet  3  . esomeprazole (NEXIUM) 40 MG capsule Take 1 capsule (40 mg total) by mouth daily.  90 capsule  3  . fexofenadine (ALLEGRA) 180 MG tablet Take 180 mg by mouth daily.        Marland Kitchen glimepiride (AMARYL) 4 MG tablet Take 1 tablet (4 mg total) by mouth daily.  90 tablet  1  . glucose blood (PRECISION XTRA TEST STRIPS) test strip Test blood sugar once a day. Dx 250.00  100 each  12  . guaiFENesin-codeine (ROBITUSSIN AC) 100-10 MG/5ML syrup Take 10 mLs by mouth 3 (three) times daily as needed for cough.  240 mL  0  . mometasone (NASONEX) 50 MCG/ACT nasal spray Place 2 sprays into the nose daily.  41 g  1  .  mometasone-formoterol (DULERA) 100-5 MCG/ACT AERO Inhale 2 puffs into the lungs 2 (two) times daily.  1 Inhaler  5  . nebivolol (BYSTOLIC) 5 MG tablet Take 5 mg by mouth daily.        . niacin (NIASPAN) 1000 MG CR tablet Take 1 tablet (1,000 mg total) by mouth at bedtime.  90 tablet  1  . predniSONE (DELTASONE) 10 MG tablet 3 po qd for 3 days then 2 po qd for 3 days the 1 po qd for 3 days  18 tablet  0  . quinapril (ACCUPRIL) 20 MG tablet Take 1 tablet (20 mg total) by mouth at bedtime.  90 tablet  1  . SitaGLIPtin-MetFORMIN HCl (JANUMET XR) [id number] MG TB24 Take 1 tablet by mouth 1 day or 1 dose.  90 tablet  1  . [DISCONTINUED] citalopram (CELEXA) 10 MG tablet Take 1 tablet (10 mg total) by mouth daily.  90 tablet  0   No current facility-administered medications on file prior to visit.    Allergies  Allergen Reactions  . Cephalexin     REACTION: pt is not allergic to penicillin    Family History  Problem Relation Age of Onset  . Stroke  Mother   . Pulmonary embolism Father     History   Social History  . Marital Status: Married    Spouse Name: N/A    Number of Children: N/A  . Years of Education: N/A   Social History Main Topics  . Smoking status: Former Games developer  . Smokeless tobacco: Never Used  . Alcohol Use: 0.0 oz/week    0 Cans of beer per week  . Drug Use: No  . Sexual Activity:    Other Topics Concern  . None   Social History Narrative  . None   ROS See HPI.  All other ROS are negative.  Filed Vitals:   11/29/12 1310  BP: 126/72  Pulse: 72  Temp: 98.2 F (36.8 C)  Resp: 16   Physical Exam  Vitals reviewed. Constitutional: He is oriented to person, place, and time and well-developed, well-nourished, and in no distress.  HENT:  Head: Normocephalic and atraumatic.  Right Ear: External ear normal.  Left Ear: External ear normal.  Nose: Nose normal.  Mouth/Throat: Oropharynx is clear and moist. No oropharyngeal exudate.  L TM within normal limits.  R  TM not visualized due to excessive cerumen. Tenderness to percussion over right maxillary sinus and right frontal sinus.  Eyes: Conjunctivae are normal.  Neck: Neck supple.  Cardiovascular: Normal rate, regular rhythm and normal heart sounds.   Pulmonary/Chest: Effort normal and breath sounds normal. No respiratory distress. He has no wheezes. He has no rales. He exhibits no tenderness.  Lymphadenopathy:    He has no cervical adenopathy.  Neurological: He is alert and oriented to person, place, and time.  Skin: Skin is warm and dry. No rash noted.   Assessment/Plan: Sinusitis, acute Rx Augmentin.  Increase fluids.  Probiotic.  Saline nasal spray.  Rest.  Humidifier.  OTC Mucinex.

## 2012-11-29 NOTE — Patient Instructions (Signed)
Take Augmentin twice daily (with food) for 10 days.  Increase fluid intake.  Take a daily probiotic.  Saline nasal spray.  Get plenty of rest.  Continue allergy medications.  Please call or return to clinic if symptoms are not improving.

## 2012-11-29 NOTE — Assessment & Plan Note (Signed)
Rx Augmentin.  Increase fluids.  Probiotic.  Saline nasal spray.  Rest.  Humidifier.  OTC Mucinex.

## 2012-12-12 ENCOUNTER — Ambulatory Visit (INDEPENDENT_AMBULATORY_CARE_PROVIDER_SITE_OTHER): Payer: Medicare Other | Admitting: Physician Assistant

## 2012-12-12 ENCOUNTER — Encounter: Payer: Self-pay | Admitting: Physician Assistant

## 2012-12-12 VITALS — BP 118/72 | HR 71 | Temp 98.2°F | Resp 18 | Ht 69.25 in | Wt 221.5 lb

## 2012-12-12 DIAGNOSIS — J9801 Acute bronchospasm: Secondary | ICD-10-CM | POA: Insufficient documentation

## 2012-12-12 MED ORDER — HYDROCOD POLST-CHLORPHEN POLST 10-8 MG/5ML PO LQCR: 5.0000 mL | Freq: Two times a day (BID) | ORAL | Status: DC | PRN

## 2012-12-12 MED ORDER — PREDNISONE 10 MG PO TABS: ORAL_TABLET | ORAL | Status: DC

## 2012-12-12 NOTE — Patient Instructions (Signed)
Please take medications as prescribed.  Continue to drink plenty of fluids and get plenty of rest.  Continue saline nasal spray and daily probiotic.  Use your Dulera inhaler as prescribed.  Follow-up with Dr. Laury Axon as needed.

## 2012-12-12 NOTE — Progress Notes (Signed)
Patient ID: Bruce Romero, male   DOB: 1945/07/27, 67 y.o.   MRN: [id number]  Patient presents to clinic today c/o dry, persistent cough.  Patient was seen 1 week ago and diagnosed with sinusitis.  Patient Rx'd with Augmentin.  Patient states sinus symptoms have resolved, but he now has a lingering dry cough.  Patient denies fever, chills, malaise, shortness of breath.  Endorses occasional wheezing.  Patient has Rx for albuterol and Dulera from a previous episode of bronchospasm, but has not been using them.   Past Medical History  Diagnosis Date  . 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 Diabetes mellitus   . Hyperlipidemia   . Hypertension     Current Outpatient Prescriptions on File Prior to Visit  Medication Sig Dispense Refill  . albuterol (PROAIR HFA) 108 (90 BASE) MCG/ACT inhaler Inhale 2 puffs into the lungs every 6 (six) hours as needed for wheezing.  1 Inhaler  2  . ALPRAZolam (XANAX) 0.25 MG tablet 1 po tid prn  60 tablet  0  . amLODipine (NORVASC) 10 MG tablet Take 1 tablet (10 mg total) by mouth daily.  90 tablet  1  . atorvastatin (LIPITOR) 10 MG tablet Take 1 tablet (10 mg total) by mouth daily.  90 tablet  1  . Cholecalciferol (VITAMIN D) 2000 UNITS tablet Take 2,000 Units by mouth daily.        Marland Kitchen dicyclomine (BENTYL) 20 MG tablet take 1 tablet by mouth every 6 hours  60 tablet  3  . esomeprazole (NEXIUM) 40 MG capsule Take 1 capsule (40 mg total) by mouth daily.  90 capsule  3  . fexofenadine (ALLEGRA) 180 MG tablet Take 180 mg by mouth daily.        Marland Kitchen glimepiride (AMARYL) 4 MG tablet Take 1 tablet (4 mg total) by mouth daily.  90 tablet  1  . glucose blood (PRECISION XTRA TEST STRIPS) test strip Test blood sugar once a day. Dx 250.00  100 each  12  . mometasone (NASONEX) 50 MCG/ACT nasal spray Place 2 sprays into the nose daily.  41 g  1  . mometasone-formoterol (DULERA) 100-5 MCG/ACT AERO Inhale 2 puffs into the lungs 2 (two) times daily.  1 Inhaler  5   . nebivolol (BYSTOLIC) 5 MG tablet Take 5 mg by mouth daily.        . niacin (NIASPAN) 1000 MG CR tablet Take 1 tablet (1,000 mg total) by mouth at bedtime.  90 tablet  1  . quinapril (ACCUPRIL) 20 MG tablet Take 1 tablet (20 mg total) by mouth at bedtime.  90 tablet  1  . SitaGLIPtin-MetFORMIN HCl (JANUMET XR) [phone] MG TB24 Take 1 tablet by mouth 1 day or 1 dose.  90 tablet  1  . [DISCONTINUED] citalopram (CELEXA) 10 MG tablet Take 1 tablet (10 mg total) by mouth daily.  90 tablet  0   No current facility-administered medications on file prior to visit.    Allergies  Allergen Reactions  . Cephalexin     REACTION: pt is not allergic to penicillin    Family History  Problem Relation Age of Onset  . Stroke Mother   . Pulmonary embolism Father     History   Social History  . Marital Status: Married    Spouse Name: N/A    Number of Children: N/A  . Years of Education: N/A   Social History Main Topics  . Smoking status:  Former Smoker  . Smokeless tobacco: Never Used  . Alcohol Use: 0.0 oz/week    0 Cans of beer per week  . Drug Use: No  . Sexual Activity:    Other Topics Concern  . None   Social History Narrative  . None   ROS See HPI.  All other ROS are negative.   Filed Vitals:   12/12/12 1317  BP: 118/72  Pulse: 71  Temp: 98.2 F (36.8 C)  Resp: 18   Physical Exam  Vitals reviewed. Constitutional: He is oriented to person, place, and time and well-developed, well-nourished, and in no distress.  HENT:  Head: Normocephalic and atraumatic.  Right Ear: External ear normal.  Left Ear: External ear normal.  Nose: Nose normal.  Mouth/Throat: Oropharynx is clear and moist. No oropharyngeal exudate.  TM wnl bilaterally.  No tenderness to percussion over sinuses noted on exam.  Cardiovascular: Normal rate, regular rhythm and normal heart sounds.   Pulmonary/Chest: Effort normal and breath sounds normal. No respiratory distress. He has no wheezes. He has no  rales. He exhibits no tenderness.  Neurological: He is alert and oriented to person, place, and time.  Skin: Skin is warm and dry. No rash noted.  Psychiatric: Affect normal.   Assessment/Plan: Bronchospasm Rx Prednisone Taper.  Rx Tussionex.  Saline nasal spray.  Increase fluids.  Rest.  Albuterol and Dulera as prescribed.  Follow-up with PCP if symptoms persist.

## 2012-12-12 NOTE — Assessment & Plan Note (Signed)
Rx Prednisone Taper.  Rx Tussionex.  Saline nasal spray.  Increase fluids.  Rest.  Albuterol and Dulera as prescribed.  Follow-up with PCP if symptoms persist.

## 2012-12-15 ENCOUNTER — Encounter: Payer: Self-pay | Admitting: Family Medicine

## 2012-12-17 MED ORDER — SITAGLIP PHOS-METFORMIN HCL ER 100-1000 MG PO TB24: 1.0000 | ORAL_TABLET | ORAL | Status: DC

## 2012-12-17 MED ORDER — AMLODIPINE BESYLATE 10 MG PO TABS: 10.0000 mg | ORAL_TABLET | Freq: Every day | ORAL | Status: DC

## 2012-12-17 MED ORDER — NEBIVOLOL HCL 5 MG PO TABS: 5.0000 mg | ORAL_TABLET | Freq: Every day | ORAL | Status: DC

## 2012-12-17 MED ORDER — ATORVASTATIN CALCIUM 10 MG PO TABS: 10.0000 mg | ORAL_TABLET | Freq: Every day | ORAL | Status: DC

## 2012-12-24 ENCOUNTER — Telehealth: Payer: Self-pay | Admitting: Family Medicine

## 2012-12-24 DIAGNOSIS — E119 Type 2 diabetes mellitus without complications: Secondary | ICD-10-CM

## 2012-12-24 DIAGNOSIS — E785 Hyperlipidemia, unspecified: Secondary | ICD-10-CM

## 2012-12-24 NOTE — Telephone Encounter (Signed)
Patient receive MyChart message telling him to call and schedule labs. Please advise on orders.

## 2012-12-24 NOTE — Telephone Encounter (Signed)
Orders are in.     KP 

## 2012-12-25 ENCOUNTER — Other Ambulatory Visit (INDEPENDENT_AMBULATORY_CARE_PROVIDER_SITE_OTHER): Payer: Medicare Other

## 2012-12-25 DIAGNOSIS — E785 Hyperlipidemia, unspecified: Secondary | ICD-10-CM

## 2012-12-25 DIAGNOSIS — E119 Type 2 diabetes mellitus without complications: Secondary | ICD-10-CM

## 2012-12-25 LAB — HEPATIC FUNCTION PANEL
ALT: 30 U/L (ref 0–53)
AST: 27 U/L (ref 0–37)
Albumin: 3.9 g/dL (ref 3.5–5.2)
Bilirubin, Direct: 0 mg/dL (ref 0.0–0.3)
Total Bilirubin: 0.9 mg/dL (ref 0.3–1.2)
Total Protein: 7 g/dL (ref 6.0–8.3)

## 2012-12-25 LAB — BASIC METABOLIC PANEL
BUN: 14 mg/dL (ref 6–23)
CO2: 27 mEq/L (ref 19–32)
Calcium: 8.9 mg/dL (ref 8.4–10.5)
Creatinine, Ser: 1 mg/dL (ref 0.4–1.5)
GFR: 80.75 mL/min (ref >60.00)
Glucose, Bld: 147 mg/dL — ABNORMAL HIGH (ref 70–99)
Potassium: 4.3 mEq/L (ref 3.5–5.1)

## 2012-12-25 LAB — LIPID PANEL
Cholesterol: 124 mg/dL (ref 0–200)
HDL: 47.1 mg/dL (ref >39.00)
LDL Cholesterol: 53 mg/dL (ref 0–99)
Triglycerides: 122 mg/dL (ref 0.0–149.0)
VLDL: 24.4 mg/dL (ref 0.0–40.0)

## 2013-01-17 ENCOUNTER — Encounter: Payer: Self-pay | Admitting: Family Medicine

## 2013-01-17 ENCOUNTER — Ambulatory Visit (INDEPENDENT_AMBULATORY_CARE_PROVIDER_SITE_OTHER): Payer: Medicare Other | Admitting: Family Medicine

## 2013-01-17 VITALS — BP 128/78 | HR 70 | Temp 98.1°F | Ht 69.0 in | Wt 231.2 lb

## 2013-01-17 DIAGNOSIS — J019 Acute sinusitis, unspecified: Secondary | ICD-10-CM

## 2013-01-17 MED ORDER — SULFAMETHOXAZOLE-TMP DS 800-160 MG PO TABS: 1.0000 | ORAL_TABLET | Freq: Two times a day (BID) | ORAL | Status: DC

## 2013-01-17 NOTE — Progress Notes (Signed)
Pre visit review using our clinic review tool, if applicable. No additional management support is needed unless otherwise documented below in the visit note. 

## 2013-01-17 NOTE — Assessment & Plan Note (Signed)
New to provider, recurrent problem for pt.  sxs and PE consistent w/ infxn.  Start abx.  Reviewed supportive care and red flags that should prompt return.  Pt expressed understanding and is in agreement w/ plan.

## 2013-01-17 NOTE — Patient Instructions (Signed)
Follow up as needed Start the Bactrim twice daily- take w/ food Drink plenty of fluids Mucinex DM for cough and congestion REST! Call with any questions or concerns Hang in there! Happy Holidays!!!

## 2013-01-17 NOTE — Progress Notes (Signed)
   Subjective:    Patient ID: Bruce Romero, male    DOB: [birth year], 67 y.o.   MRN: [id number]  HPI URI- pt reports similar sxs last month and saw Selena Batten, tx'd w/ .  'this time it hasn't gone into my chest'. sxs started 'a couple of days ago' w/ watery eyes.  Yesterday developed cough, R ear pain, R maxillary sinus pain.  No fever.  No N/V.  + sick contacts.    Review of Systems For ROS see HPI     Objective:   Physical Exam  Vitals reviewed. Constitutional: He appears well-developed and well-nourished. No distress.  HENT:  Head: Normocephalic and atraumatic.  Right Ear: Tympanic membrane normal.  Left Ear: Tympanic membrane normal.  Nose: Mucosal edema and rhinorrhea present. Right sinus exhibits maxillary sinus tenderness and frontal sinus tenderness. Left sinus exhibits no maxillary sinus tenderness and no frontal sinus tenderness.  Mouth/Throat: Mucous membranes are normal. Oropharyngeal exudate and posterior oropharyngeal erythema present. No posterior oropharyngeal edema.  + PND  Eyes: Conjunctivae and EOM are normal. Pupils are equal, round, and reactive to light.  Neck: Normal range of motion. Neck supple.  Cardiovascular: Normal rate, regular rhythm and normal heart sounds.   Pulmonary/Chest: Effort normal and breath sounds normal. No respiratory distress. He has no wheezes.  + hacking cough  Lymphadenopathy:    He has no cervical adenopathy.  Skin: Skin is warm and dry.          Assessment & Plan:

## 2013-01-21 ENCOUNTER — Ambulatory Visit (INDEPENDENT_AMBULATORY_CARE_PROVIDER_SITE_OTHER): Payer: Medicare Other | Admitting: Cardiovascular Disease

## 2013-01-21 ENCOUNTER — Encounter: Payer: Self-pay | Admitting: Cardiovascular Disease

## 2013-01-21 VITALS — BP 130/70 | HR 55 | Ht 70.0 in | Wt 226.1 lb

## 2013-01-21 DIAGNOSIS — I10 Essential (primary) hypertension: Secondary | ICD-10-CM

## 2013-01-21 DIAGNOSIS — E785 Hyperlipidemia, unspecified: Secondary | ICD-10-CM

## 2013-01-21 NOTE — Assessment & Plan Note (Signed)
On statin therapy with recent lipid profile performed 12/25/12 revealing a glucose of 124, LDL of 53 an HDL of 47

## 2013-01-21 NOTE — Assessment & Plan Note (Signed)
Under good control on current medications 

## 2013-01-21 NOTE — Progress Notes (Signed)
01/21/2013 Bruce Romero   11/10/1945  [id number]  Primary Physician Loreen Freud, DO Primary Cardiologist: Runell Gess MD Roseanne Reno   HPI:  [name] is a 67 year old moderately overweight married Caucasian male father of 4, confrontative her grandchildren who is retired from [employer]. He was formally a patient of Dr. Kandis Cocking. His history includes hypertension, diabetes and hyperlipidemia. His brother did have a stent in his heart in his 74s. He is never had a heart attack or stroke and denies chest pain or shortness of breath. He had a recent normal 2-D echo and Myoview stress test performed 01/12/11 was nonischemic.   Current Outpatient Prescriptions  Medication Sig Dispense Refill  . albuterol (PROAIR HFA) 108 (90 BASE) MCG/ACT inhaler Inhale 2 puffs into the lungs every 6 (six) hours as needed for wheezing.  1 Inhaler  2  . ALPRAZolam (XANAX) 0.25 MG tablet 1 po tid prn  60 tablet  0  . amLODipine (NORVASC) 10 MG tablet Take 1 tablet (10 mg total) by mouth daily.  90 tablet  1  . atorvastatin (LIPITOR) 10 MG tablet Take 1 tablet (10 mg total) by mouth daily.  90 tablet  1  . chlorpheniramine-HYDROcodone (TUSSIONEX PENNKINETIC ER) 10-8 MG/5ML LQCR Take 5 mLs by mouth every 12 (twelve) hours as needed for cough.  115 mL  0  . Cholecalciferol (VITAMIN D) 2000 UNITS tablet Take 2,000 Units by mouth daily.        Marland Kitchen dicyclomine (BENTYL) 20 MG tablet take 1 tablet by mouth every 6 hours  60 tablet  3  . esomeprazole (NEXIUM) 40 MG capsule Take 1 capsule (40 mg total) by mouth daily.  90 capsule  3  . fexofenadine (ALLEGRA) 180 MG tablet Take 180 mg by mouth daily.        Marland Kitchen glimepiride (AMARYL) 4 MG tablet Take 1 tablet (4 mg total) by mouth daily.  90 tablet  1  . glucose blood (PRECISION XTRA TEST STRIPS) test strip Test blood sugar once a day. Dx 250.00  100 each  12  . mometasone (NASONEX) 50 MCG/ACT nasal spray Place 2 sprays into the nose  daily.  41 g  1  . mometasone-formoterol (DULERA) 100-5 MCG/ACT AERO Inhale 2 puffs into the lungs 2 (two) times daily.  1 Inhaler  5  . nebivolol (BYSTOLIC) 5 MG tablet Take 1 tablet (5 mg total) by mouth daily.  90 tablet  1  . niacin (NIASPAN) 1000 MG CR tablet Take 1 tablet (1,000 mg total) by mouth at bedtime.  90 tablet  1  . quinapril (ACCUPRIL) 20 MG tablet Take 1 tablet (20 mg total) by mouth at bedtime.  90 tablet  1  . SitaGLIPtin-MetFORMIN HCl (JANUMET XR) [id number] MG TB24 Take 1 tablet by mouth 1 day or 1 dose.  90 tablet  1  . sulfamethoxazole-trimethoprim (BACTRIM DS) 800-160 MG per tablet Take 1 tablet by mouth 2 (two) times daily.  20 tablet  0  . [DISCONTINUED] citalopram (CELEXA) 10 MG tablet Take 1 tablet (10 mg total) by mouth daily.  90 tablet  0   No current facility-administered medications for this visit.    Allergies  Allergen Reactions  . Cephalexin     REACTION: pt is not allergic to penicillin    History   Social History  . Marital Status: Married    Spouse Name: N/A    Number of Children: N/A  . Years of Education: N/A  Occupational History  . Not on file.   Social History Main Topics  . Smoking status: Former Games developer  . Smokeless tobacco: Never Used  . Alcohol Use: 0.0 oz/week    0 Cans of beer per week  . Drug Use: No  . Sexual Activity:    Other Topics Concern  . Not on file   Social History Narrative  . No narrative on file     Review of Systems: General: negative for chills, fever, night sweats or weight changes.  Cardiovascular: negative for chest pain, dyspnea on exertion, edema, orthopnea, palpitations, paroxysmal nocturnal dyspnea or shortness of breath Dermatological: negative for rash Respiratory: negative for cough or wheezing Urologic: negative for hematuria Abdominal: negative for nausea, vomiting, diarrhea, bright red blood per rectum, melena, or hematemesis Neurologic: negative for visual changes, syncope, or  dizziness All other systems reviewed and are otherwise negative except as noted above.    Blood pressure 130/70, pulse 55, height 5\' 10"  (1.778 m), weight 226 lb 1.6 oz (102.558 kg).  General appearance: alert and no distress Neck: no adenopathy, no carotid bruit, no JVD, supple, symmetrical, trachea midline and thyroid not enlarged, symmetric, no tenderness/mass/nodules Lungs: clear to auscultation bilaterally  EKG sinus bradycardia at 55 without ST or T wave changes  ASSESSMENT AND PLAN:   ESSENTIAL HYPERTENSION, BENIGN Under good control on current medications  HYPERLIPIDEMIA On statin therapy with recent lipid profile performed 12/25/12 revealing a glucose of 124, LDL of 53 an HDL of 47      Runell Gess MD [facility], [facility] 01/21/2013 12:13 PM

## 2013-01-21 NOTE — Patient Instructions (Signed)
Your physician wants you to follow-up in: 6 months with an extender and 1 year with Dr Berry. You will receive a reminder letter in the mail two months in advance. If you don't receive a letter, please call our office to schedule the follow-up appointment.  

## 2013-01-23 ENCOUNTER — Telehealth: Payer: Self-pay | Admitting: *Deleted

## 2013-01-23 MED ORDER — AMOXICILLIN-POT CLAVULANATE 875-125 MG PO TABS: 1.0000 | ORAL_TABLET | Freq: Two times a day (BID) | ORAL | Status: DC

## 2013-01-23 NOTE — Telephone Encounter (Signed)
Patient states that the Bactrim that he was prescribed on 01/17/2013. Patient states that he believes he is experiencing side effects from this medication. Patient states he is experiencing joint pain, nausea. Patient still has the cough and the congestion. Please advise. SW

## 2013-01-23 NOTE — Telephone Encounter (Signed)
Patient notified and med sent to pharmacy.  

## 2013-01-23 NOTE — Telephone Encounter (Signed)
augmentin 875 mg 1 po bid  #20   D/c bactrim

## 2013-02-13 ENCOUNTER — Encounter: Payer: Self-pay | Admitting: Family Medicine

## 2013-02-14 ENCOUNTER — Other Ambulatory Visit: Payer: Self-pay

## 2013-02-14 ENCOUNTER — Telehealth: Payer: Self-pay | Admitting: Cardiovascular Disease

## 2013-02-14 MED ORDER — QUINAPRIL HCL 20 MG PO TABS: 20.0000 mg | ORAL_TABLET | Freq: Every day | ORAL | Status: DC

## 2013-02-14 MED ORDER — NEBIVOLOL HCL 5 MG PO TABS: 5.0000 mg | ORAL_TABLET | Freq: Every day | ORAL | Status: DC

## 2013-02-14 MED ORDER — AMLODIPINE BESYLATE 10 MG PO TABS: 10.0000 mg | ORAL_TABLET | Freq: Every day | ORAL | Status: DC

## 2013-02-14 MED ORDER — ATORVASTATIN CALCIUM 10 MG PO TABS: 10.0000 mg | ORAL_TABLET | Freq: Every day | ORAL | Status: DC

## 2013-02-14 MED ORDER — NIACIN ER (ANTIHYPERLIPIDEMIC) 1000 MG PO TBCR: 1000.0000 mg | EXTENDED_RELEASE_TABLET | Freq: Every day | ORAL | Status: DC

## 2013-02-14 MED ORDER — GLIMEPIRIDE 4 MG PO TABS: 4.0000 mg | ORAL_TABLET | Freq: Every day | ORAL | Status: DC

## 2013-02-14 MED ORDER — SITAGLIP PHOS-METFORMIN HCL ER 100-1000 MG PO TB24: 1.0000 | ORAL_TABLET | ORAL | Status: DC

## 2013-02-14 MED ORDER — ESOMEPRAZOLE MAGNESIUM 40 MG PO CPDR: 40.0000 mg | DELAYED_RELEASE_CAPSULE | Freq: Every day | ORAL | Status: DC

## 2013-02-14 NOTE — Telephone Encounter (Signed)
Returning [place] call

## 2013-02-14 NOTE — Telephone Encounter (Signed)
Returned call.  Left message to call back before 4pm.  

## 2013-02-14 NOTE — Telephone Encounter (Signed)
Went to get med [employer] and insurance plan has changed  Bystolic 188 dollars  Cannot afford.  Can he be changed to something less expensive.  Please call

## 2013-02-14 NOTE — Telephone Encounter (Signed)
Returned call and pt verified x 2.  Pt requesting an alternative for Bystolic.  Stated he has requested a Tier Change and if approved would lower cost from $240 for 90-days to $120, but this would still affect him going into the doughnut hole.  Pt stated he is trying to avoid that if at all possible.  Pt informed Curt Bears, Dr. Kennon Holter nurse will be notified to discuss options with him.  Pt verbalized understanding and agreed w/ plan.  Message forwarded to K. Donne Hazel, RN to discuss w/ Dr. Gwenlyn Found.

## 2013-02-15 ENCOUNTER — Encounter: Payer: Self-pay | Admitting: Family Medicine

## 2013-02-15 NOTE — Telephone Encounter (Signed)
Please advise. Thank you

## 2013-02-15 NOTE — Telephone Encounter (Signed)
Spoke with patient, he has about 4 months of Bystolic on hand.  I advised that when he runs out, increase his quinapril to 40mg  daily (in evening) and start with daily BP checks.  Call me after 10-14 days to verify BP still controlled.  Pt voiced understanding.

## 2013-02-20 ENCOUNTER — Other Ambulatory Visit: Payer: Self-pay | Admitting: Family Medicine

## 2013-02-22 ENCOUNTER — Other Ambulatory Visit: Payer: Self-pay | Admitting: *Deleted

## 2013-02-22 ENCOUNTER — Telehealth: Payer: Self-pay | Admitting: *Deleted

## 2013-02-22 NOTE — Telephone Encounter (Signed)
Patient called and stated that he would to switch his Janumet to something else. Wife brought in a formulary list. Per Dr. Etter Sjogren Acarbose 25mg  1 po TID start of meal and Metformin XR 1000mg  QHS. Patient decide to continue the janumet because he didn't want to remember to take the pills

## 2013-02-25 ENCOUNTER — Encounter: Payer: Self-pay | Admitting: Family Medicine

## 2013-02-27 NOTE — Telephone Encounter (Signed)
The patient is already on Amaryl 4 mg. Are we changing his script to 2 mg???

## 2013-03-01 ENCOUNTER — Other Ambulatory Visit: Payer: Self-pay | Admitting: Family Medicine

## 2013-03-01 DIAGNOSIS — E1159 Type 2 diabetes mellitus with other circulatory complications: Secondary | ICD-10-CM

## 2013-03-01 MED ORDER — METFORMIN HCL 1000 MG PO TABS
1000.0000 mg | ORAL_TABLET | Freq: Two times a day (BID) | ORAL | Status: DC
Start: 2013-03-01 — End: 2013-03-04

## 2013-03-04 ENCOUNTER — Ambulatory Visit (INDEPENDENT_AMBULATORY_CARE_PROVIDER_SITE_OTHER): Payer: Medicare Other | Admitting: Internal Medicine

## 2013-03-04 ENCOUNTER — Encounter: Payer: Self-pay | Admitting: Internal Medicine

## 2013-03-04 VITALS — BP 138/82 | HR 78 | Temp 97.9°F | Wt 228.0 lb

## 2013-03-04 DIAGNOSIS — H01009 Unspecified blepharitis unspecified eye, unspecified eyelid: Secondary | ICD-10-CM

## 2013-03-04 DIAGNOSIS — E119 Type 2 diabetes mellitus without complications: Secondary | ICD-10-CM

## 2013-03-04 MED ORDER — GENTAMICIN FORTIFIED OPHTHALMIC SOLUTION: 2.0000 [drp] | Freq: Four times a day (QID) | OPHTHALMIC | Status: DC

## 2013-03-04 NOTE — Progress Notes (Signed)
Pre visit review using our clinic review tool, if applicable. No additional management support is needed unless otherwise documented below in the visit note. 

## 2013-03-04 NOTE — Patient Instructions (Signed)
Continue cleaning the eyes with baby shampoo and warm water and use Systane For the next 5 days use antibiotic drops If you're not improving in, please call your ophthalmologist.  You are due to see Dr. Etter Sjogren, please make an appointment

## 2013-03-04 NOTE — Progress Notes (Signed)
   Subjective:    Patient ID: Bruce Romero, male    DOB: [birth year], 68 y.o.   MRN: [id number]  HPI Acute visit A week ago noted bilateral eye irritation, worse in the morning, mild redness at both conjunctiva and eye lids. Last year saw the eye doctor and was dx w/ blepharitis Other than that he feels well. Med list updated, not taking metformin rather janumet  Past Medical History  Diagnosis Date  . 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 Diabetes mellitus   . Hyperlipidemia   . Hypertension    Past Surgical History  Procedure Laterality Date  . Cholecystectomy       Review of Systems Vision  remains at baseline. Minimal if any eye discharge. Denies eyes itching or hurting. Denies chronic dry eyes    Objective:   Physical Exam  BP 138/82  Pulse 78  Temp(Src) 97.9 F (36.6 C)  Wt 228 lb (103.42 kg)  SpO2 98% General -- alert, well-developed, NAD.  HEENT--   EOMI-PERLA Conjunctiva w/ mild redness, upper eye lids with mild redness as well, no scalines  Ant chambers normal Neurologic--  alert & oriented X3. Speech normal, gait normal, strength normal in all extremities.  Psych-- Cognition and judgment appear intact. Cooperative with normal attention span and concentration. No anxious or depressed appearing.      Assessment & Plan:   Mild blepharitis, Continue with routine care as rx by eye doctor: baby shampoo and systane;  Garamycin x few days . See instructions. Diabetes, med list updated, on janumet,, recommend to see PCP

## 2013-03-07 ENCOUNTER — Telehealth: Payer: Self-pay

## 2013-03-07 NOTE — Telephone Encounter (Signed)
Relevant patient education assigned to patient using Emmi. ° °

## 2013-04-16 ENCOUNTER — Telehealth: Payer: Self-pay | Admitting: *Deleted

## 2013-04-16 NOTE — Telephone Encounter (Signed)
Received approval letter by mail from [facility] approving prior authorization for Niacin ER. This medication is approved through April 11, 2014. Approval letter sent to batch to be scanned. JG//CMA

## 2013-04-30 ENCOUNTER — Other Ambulatory Visit (INDEPENDENT_AMBULATORY_CARE_PROVIDER_SITE_OTHER): Payer: Medicare Other

## 2013-04-30 ENCOUNTER — Encounter: Payer: Self-pay | Admitting: Lab

## 2013-04-30 DIAGNOSIS — E119 Type 2 diabetes mellitus without complications: Secondary | ICD-10-CM

## 2013-04-30 DIAGNOSIS — E785 Hyperlipidemia, unspecified: Secondary | ICD-10-CM

## 2013-04-30 LAB — BASIC METABOLIC PANEL
BUN: 15 mg/dL (ref 6–23)
CO2: 28 meq/L (ref 19–32)
Calcium: 9.1 mg/dL (ref 8.4–10.5)
Chloride: 97 mEq/L (ref 96–112)
Creatinine, Ser: 1 mg/dL (ref 0.4–1.5)
GFR: 75.32 mL/min (ref >60.00)
Glucose, Bld: 152 mg/dL — ABNORMAL HIGH (ref 70–99)
POTASSIUM: 4 meq/L (ref 3.5–5.1)
SODIUM: 134 meq/L — AB (ref 135–145)

## 2013-04-30 LAB — LIPID PANEL
Cholesterol: 138 mg/dL (ref 0–200)
HDL: 48 mg/dL (ref >39.00)
LDL Cholesterol: 47 mg/dL (ref 0–99)
Total CHOL/HDL Ratio: 3
Triglycerides: 213 mg/dL — ABNORMAL HIGH (ref 0.0–149.0)
VLDL: 42.6 mg/dL — AB (ref 0.0–40.0)

## 2013-04-30 LAB — HEPATIC FUNCTION PANEL
ALBUMIN: 4.1 g/dL (ref 3.5–5.2)
ALK PHOS: 44 U/L (ref 39–117)
ALT: 36 U/L (ref 0–53)
AST: 48 U/L — AB (ref 0–37)
Bilirubin, Direct: 0 mg/dL (ref 0.0–0.3)
TOTAL PROTEIN: 7.2 g/dL (ref 6.0–8.3)
Total Bilirubin: 0.7 mg/dL (ref 0.3–1.2)

## 2013-04-30 LAB — HEMOGLOBIN A1C: Hgb A1c MFr Bld: 6.9 % — ABNORMAL HIGH (ref 4.6–6.5)

## 2013-05-06 ENCOUNTER — Encounter: Payer: Self-pay | Admitting: Family Medicine

## 2013-05-06 MED ORDER — SITAGLIP PHOS-METFORMIN HCL ER 100-1000 MG PO TB24: 1.0000 | ORAL_TABLET | Freq: Every day | ORAL | Status: DC

## 2013-05-09 ENCOUNTER — Telehealth: Payer: Self-pay

## 2013-05-09 NOTE — Telephone Encounter (Signed)
Janumet XR [phone] samples given to the patient. Ok per Dr.Lowne to deplete the sample closet.       KP

## 2013-05-14 ENCOUNTER — Encounter: Payer: Self-pay | Admitting: Family Medicine

## 2013-05-14 ENCOUNTER — Ambulatory Visit (INDEPENDENT_AMBULATORY_CARE_PROVIDER_SITE_OTHER): Payer: Medicare Other | Admitting: Family Medicine

## 2013-05-14 VITALS — BP 120/70 | HR 63 | Temp 98.2°F | Wt 226.0 lb

## 2013-05-14 DIAGNOSIS — E785 Hyperlipidemia, unspecified: Secondary | ICD-10-CM

## 2013-05-14 DIAGNOSIS — I10 Essential (primary) hypertension: Secondary | ICD-10-CM

## 2013-05-14 DIAGNOSIS — E1159 Type 2 diabetes mellitus with other circulatory complications: Secondary | ICD-10-CM

## 2013-05-14 DIAGNOSIS — M1990 Unspecified osteoarthritis, unspecified site: Secondary | ICD-10-CM

## 2013-05-14 MED ORDER — QUINAPRIL HCL 40 MG PO TABS: 40.0000 mg | ORAL_TABLET | Freq: Every day | ORAL | Status: DC

## 2013-05-14 MED ORDER — MELOXICAM 15 MG PO TABS
ORAL_TABLET | ORAL | Status: DC
Start: 2013-05-14 — End: 2013-08-28

## 2013-05-14 MED ORDER — TIZANIDINE HCL 4 MG PO CAPS: 4.0000 mg | ORAL_CAPSULE | Freq: Three times a day (TID) | ORAL | Status: DC | PRN

## 2013-05-14 NOTE — Progress Notes (Signed)
Pre visit review using our clinic review tool, if applicable. No additional management support is needed unless otherwise documented below in the visit note. 

## 2013-05-14 NOTE — Progress Notes (Signed)
   Subjective:    Patient ID: Bruce Romero, male    DOB: 08/13/1945, 68 y.o.   MRN: [id number]  Arthritis Pertinent negatives include no dysuria or fatigue.  Hypertension Pertinent negatives include no chest pain, headaches, palpitations or shortness of breath.    HPI HYPERTENSION  Blood pressure range-good  Chest pain- no      Dyspnea- no Lightheadedness- no   Edema- no Other side effects - no   Medication compliance: good-- but too expensive Low salt diet- yes  DIABETES  Blood Sugar ranges-good  Polyuria- no New Visual problems- no Hypoglycemic symptoms- no Other side effects-no Medication compliance - good Last eye exam-  Foot exam- today  HYPERLIPIDEMIA  Medication compliance- good RUQ pain- no  Muscle aches- no Other side effects-no     Review of Systems  Constitutional: Negative.  Negative for diaphoresis, appetite change, fatigue and unexpected weight change.  HENT: Negative for congestion, ear pain, hearing loss, nosebleeds, postnasal drip, rhinorrhea, sinus pressure, sneezing and tinnitus.   Eyes: Negative for photophobia, pain, discharge, redness, itching and visual disturbance.  Respiratory: Negative.  Negative for cough, chest tightness, shortness of breath and wheezing.   Cardiovascular: Negative.  Negative for chest pain, palpitations and leg swelling.  Gastrointestinal: Negative for abdominal pain, constipation, blood in stool, abdominal distention and anal bleeding.  Endocrine: Negative.  Negative for cold intolerance, heat intolerance, polydipsia, polyphagia and polyuria.  Genitourinary: Negative.  Negative for dysuria, frequency and difficulty urinating.  Musculoskeletal: Positive for arthritis.  Skin: Negative.   Allergic/Immunologic: Negative.   Neurological: Negative for dizziness, weakness, light-headedness, numbness and headaches.  Psychiatric/Behavioral: Negative for suicidal ideas, confusion, sleep disturbance, dysphoric mood, decreased  concentration and agitation. The patient is not nervous/anxious.        Objective:   Physical Exam  Constitutional: He is oriented to person, place, and time. Vital signs are normal. He appears well-developed and well-nourished. He is sleeping.  HENT:  Head: Normocephalic and atraumatic.  Mouth/Throat: Oropharynx is clear and moist.  Eyes: EOM are normal. Pupils are equal, round, and reactive to light.  Neck: Normal range of motion. Neck supple. No thyromegaly present.  Cardiovascular: Normal rate and regular rhythm.   No murmur heard. Pulmonary/Chest: Effort normal and breath sounds normal. No respiratory distress. He has no wheezes. He has no rales. He exhibits no tenderness.  Musculoskeletal: He exhibits no edema and no tenderness.  Neurological: He is alert and oriented to person, place, and time.  Skin: Skin is warm and dry.  Psychiatric: He has a normal mood and affect. His behavior is normal. Judgment and thought content normal.  Sensory exam of the foot is normal, tested with the monofilament. Good pulses, no lesions or ulcers, good peripheral pulses.         Assessment & Plan:  1. Osteoarthritis If meds needed regularly -- may need to change to ultram - meloxicam (MOBIC) 15 MG tablet; 1/2-1 po qd prn pain  Dispense: 30 tablet; Refill: 5  2. HTN (hypertension) Stable----but bystolic too expensive-- inc accupril to 40 mg - quinapril (ACCUPRIL) 40 MG tablet; Take 1 tablet (40 mg total) by mouth at bedtime.  Dispense: 90 tablet; Refill: 3  3. Type II or unspecified type diabetes mellitus with peripheral circulatory disorders, uncontrolled(250.72) Labs reviewed with pt Recheck 3 months  4. Other and unspecified hyperlipidemia con't meds Labs reviewed with pt

## 2013-05-14 NOTE — Patient Instructions (Addendum)
Back Pain, Adult °Low back pain is very common. About 1 in 5 people have back pain. The cause of low back pain is rarely dangerous. The pain often gets better over time. About half of people with a sudden onset of back pain feel better in just 2 weeks. About 8 in 10 people feel better by 6 weeks.  °CAUSES °Some common causes of back pain include: °· Strain of the muscles or ligaments supporting the spine. °· Wear and tear (degeneration) of the spinal discs. °· Arthritis. °· Direct injury to the back. °DIAGNOSIS °Most of the time, the direct cause of low back pain is not known. However, back pain can be treated effectively even when the exact cause of the pain is unknown. Answering your caregiver's questions about your overall health and symptoms is one of the most accurate ways to make sure the cause of your pain is not dangerous. If your caregiver needs more information, he or she may order lab work or imaging tests (X-rays or MRIs). However, even if imaging tests show changes in your back, this usually does not require surgery. °HOME CARE INSTRUCTIONS °For many people, back pain returns. Since low back pain is rarely dangerous, it is often a condition that people can learn to manage on their own.  °· Remain active. It is stressful on the back to sit or stand in one place. Do not sit, drive, or stand in one place for more than 30 minutes at a time. Take short walks on level surfaces as soon as pain allows. Try to increase the length of time you walk each day. °· Do not stay in bed. Resting more than 1 or 2 days can delay your recovery. °· Do not avoid exercise or work. Your body is made to move. It is not dangerous to be active, even though your back may hurt. Your back will likely heal faster if you return to being active before your pain is gone. °· Pay attention to your body when you  bend and lift. Many people have less discomfort when lifting if they bend their knees, keep the load close to their bodies, and  avoid twisting. Often, the most comfortable positions are those that put less stress on your recovering back. °· Find a comfortable position to sleep. Use a firm mattress and lie on your side with your knees slightly bent. If you lie on your back, put a pillow under your knees. °· Only take over-the-counter or prescription medicines as directed by your caregiver. Over-the-counter medicines to reduce pain and inflammation are often the most helpful. Your caregiver may prescribe muscle relaxant drugs. These medicines help dull your pain so you can more quickly return to your normal activities and healthy exercise. °· Put ice on the injured area. °· Put ice in a plastic bag. °· Place a towel between your skin and the bag. °· Leave the ice on for 15-20 minutes, 03-04 times a day for the first 2 to 3 days. After that, ice and heat may be alternated to reduce pain and spasms. °· Ask your caregiver about trying back exercises and gentle massage. This may be of some benefit. °· Avoid feeling anxious or stressed. Stress increases muscle tension and can worsen back pain. It is important to recognize when you are anxious or stressed and learn ways to manage it. Exercise is a great option. °SEEK MEDICAL CARE IF: °· You have pain that is not relieved with rest or medicine. °· You have pain that does not improve in 1 week. °· You have new symptoms. °· You are generally not feeling well. °SEEK   IMMEDIATE MEDICAL CARE IF:   You have pain that radiates from your back into your legs.  You develop new bowel or bladder control problems.  You have unusual weakness or numbness in your arms or legs.  You develop nausea or vomiting.  You develop abdominal pain.  You feel faint. Document Released: 01/24/2005 Document Revised: 07/26/2011 Document Reviewed: 06/14/2010 [facility] Patient Information 2014 [place], [place].  Diabetes and Standards of Medical Care  Diabetes is complicated. You may find that your diabetes team  includes a dietitian, nurse, diabetes educator, eye doctor, and more. To help everyone know what is going on and to help you get the care you deserve, the following schedule of care was developed to help keep you on track. Below are the tests, exams, vaccines, medicines, education, and plans you will need. HbA1c test This test shows how well you have controlled your glucose over the past 2 3 months. It is used to see if your diabetes management plan needs to be adjusted.   It is performed at least 2 times a year if you are meeting treatment goals.  It is performed 4 times a year if therapy has changed or if you are not meeting treatment goals. Blood pressure test  This test is performed at every routine medical visit. The goal is less than 140/90 mmHg for most people, but 130/80 mmHg in some cases. Ask your health care provider about your goal. Dental exam  Follow up with the dentist regularly. Eye exam  If you are diagnosed with type 1 diabetes as a child, get an exam upon reaching the age of 9 years or older and have had diabetes for 3 5 years. Yearly eye exams are recommended after that initial eye exam.  If you are diagnosed with type 1 diabetes as an adult, get an exam within 5 years of diagnosis and then yearly.  If you are diagnosed with type 2 diabetes, get an exam as soon as possible after the diagnosis and then yearly. Foot care exam  Visual foot exams are performed at every routine medical visit. The exams check for cuts, injuries, or other problems with the feet.  A comprehensive foot exam should be done yearly. This includes visual inspection as well as assessing foot pulses and testing for loss of sensation.  Check your feet nightly for cuts, injuries, or other problems with your feet. Tell your health care provider if anything is not healing. Kidney function test (urine microalbumin)  This test is performed once a year.  Type 1 diabetes: The first test is performed 5  years after diagnosis.  Type 2 diabetes: The first test is performed at the time of diagnosis.  A serum creatinine and estimated glomerular filtration rate (eGFR) test is done once a year to assess the level of chronic kidney disease (CKD), if present. Lipid profile (cholesterol, HDL, LDL, triglycerides)  Performed every 5 years for most people.  The goal for LDL is less than 100 mg/dL. If you are at high risk, the goal is less than 70 mg/dL.  The goal for HDL is 40 mg/dL 50 mg/dL for men and 50 mg/dL 60 mg/dL for women. An HDL cholesterol of 60 mg/dL or higher gives some protection against heart disease.  The goal for triglycerides is less than 150 mg/dL. Influenza vaccine, pneumococcal vaccine, and hepatitis B vaccine  The influenza vaccine is recommended yearly.  The pneumococcal vaccine is generally given once in a lifetime. However, there are some instances when another  vaccination is recommended. Check with your health care provider.  The hepatitis B vaccine is also recommended for adults with diabetes. Diabetes self-management education  Education is recommended at diagnosis and ongoing as needed. Treatment plan  Your treatment plan is reviewed at every medical visit. Document Released: 11/21/2008 Document Revised: 09/26/2012 Document Reviewed: 06/26/2012 [facility] Patient Information 2014 [place].

## 2013-05-15 ENCOUNTER — Telehealth: Payer: Self-pay | Admitting: Family Medicine

## 2013-05-15 NOTE — Telephone Encounter (Signed)
Relevant patient education assigned to patient using Emmi. ° °

## 2013-05-31 ENCOUNTER — Encounter: Payer: Self-pay | Admitting: Family Medicine

## 2013-08-02 ENCOUNTER — Encounter: Payer: Self-pay | Admitting: Family Medicine

## 2013-08-02 ENCOUNTER — Ambulatory Visit (INDEPENDENT_AMBULATORY_CARE_PROVIDER_SITE_OTHER): Payer: Medicare Other | Admitting: Family Medicine

## 2013-08-02 VITALS — BP 132/86 | HR 86 | Temp 98.2°F | Resp 16 | Wt 219.0 lb

## 2013-08-02 DIAGNOSIS — N410 Acute prostatitis: Secondary | ICD-10-CM

## 2013-08-02 DIAGNOSIS — E785 Hyperlipidemia, unspecified: Secondary | ICD-10-CM

## 2013-08-02 DIAGNOSIS — R350 Frequency of micturition: Secondary | ICD-10-CM

## 2013-08-02 LAB — POCT URINALYSIS DIPSTICK
Bilirubin, UA: NEGATIVE
Blood, UA: NEGATIVE
Glucose, UA: NEGATIVE
Ketones, UA: NEGATIVE
LEUKOCYTES UA: NEGATIVE
NITRITE UA: NEGATIVE
PH UA: 5
Spec Grav, UA: 1.02
UROBILINOGEN UA: 0.2

## 2013-08-02 MED ORDER — NIACIN ER (ANTIHYPERLIPIDEMIC) 1000 MG PO TBCR: 1000.0000 mg | EXTENDED_RELEASE_TABLET | Freq: Every day | ORAL | Status: DC

## 2013-08-02 MED ORDER — CIPROFLOXACIN HCL 500 MG PO TABS: 500.0000 mg | ORAL_TABLET | Freq: Two times a day (BID) | ORAL | Status: DC

## 2013-08-02 NOTE — Assessment & Plan Note (Signed)
Chronic problem.  Pt reports he is entering the donut hole and asking for generic Niaspan.  Sent script for generic medication but unclear if this is truly cheaper for him.  Will wait until he picks up script from local pharmacy prior to sending 90 day supply to mail order.  Will follow.

## 2013-08-02 NOTE — Patient Instructions (Signed)
Follow up as needed Continue the Cipro for 5 more days Drink plenty of fluids I sent a new Niacin prescription to the pharmacy- it's generic Call with any questions or concerns Happy [employer] Birthday!

## 2013-08-02 NOTE — Progress Notes (Signed)
   Subjective:    Patient ID: Bruce Romero, male    DOB: 1946/01/26, 68 y.o.   MRN: [id number]  HPI Prostatitis- sxs started 2 days ago w/ increased frequency at night, pain w/ sitting. No dysuria.  No blood in urine.  No fever.  Started left over course of Cipro.  Hx of similar.   Pt reports pain is improving w/ cipro use.  Pt sees urology and has had negative w/u.   Review of Systems For ROS see HPI     Objective:   Physical Exam  Vitals reviewed. Constitutional: He appears well-developed and well-nourished. No distress.  Abdominal: He exhibits no distension. There is no tenderness. There is no rebound and no guarding.  Genitourinary: Prostate normal. No penile tenderness.          Assessment & Plan:

## 2013-08-02 NOTE — Assessment & Plan Note (Signed)
Pt's sxs consistent w/ prostatitis.  PE is unremarkable but pt has already had 3 days of Cipro.  Extend course.  Culture urine for completeness.  Reviewed supportive care and red flags that should prompt return.  Pt expressed understanding and is in agreement w/ plan.

## 2013-08-02 NOTE — Progress Notes (Signed)
Pre visit review using our clinic review tool, if applicable. No additional management support is needed unless otherwise documented below in the visit note. 

## 2013-08-04 LAB — URINE CULTURE
COLONY COUNT: NO GROWTH
Organism ID, Bacteria: NO GROWTH

## 2013-08-07 ENCOUNTER — Encounter: Payer: Self-pay | Admitting: Family Medicine

## 2013-08-07 ENCOUNTER — Other Ambulatory Visit: Payer: Self-pay | Admitting: Family Medicine

## 2013-08-07 ENCOUNTER — Other Ambulatory Visit: Payer: Self-pay | Admitting: General Practice

## 2013-08-07 DIAGNOSIS — R350 Frequency of micturition: Secondary | ICD-10-CM

## 2013-08-07 MED ORDER — CIPROFLOXACIN HCL 500 MG PO TABS: 500.0000 mg | ORAL_TABLET | Freq: Two times a day (BID) | ORAL | Status: AC

## 2013-08-07 MED ORDER — NIACIN ER (ANTIHYPERLIPIDEMIC) 1000 MG PO TBCR: 1000.0000 mg | EXTENDED_RELEASE_TABLET | Freq: Every day | ORAL | Status: DC

## 2013-08-07 MED ORDER — CIPROFLOXACIN HCL 500 MG PO TABS: 500.0000 mg | ORAL_TABLET | Freq: Two times a day (BID) | ORAL | Status: DC

## 2013-08-15 ENCOUNTER — Encounter: Payer: Self-pay | Admitting: Family Medicine

## 2013-08-20 ENCOUNTER — Telehealth: Payer: Self-pay | Admitting: Cardiovascular Disease

## 2013-08-20 ENCOUNTER — Telehealth: Payer: Self-pay | Admitting: Family Medicine

## 2013-08-20 NOTE — Telephone Encounter (Signed)
We can discuss at ov but Dr Gwenlyn Found did want to see pt in 6 months

## 2013-08-20 NOTE — Telephone Encounter (Signed)
Patient is aware and voiced understanding, wants to discuss being on a beta blocker.     KP

## 2013-08-20 NOTE — Telephone Encounter (Signed)
°

## 2013-08-20 NOTE — Telephone Encounter (Signed)
To MD for review Cardiology note.     KP

## 2013-08-20 NOTE — Telephone Encounter (Signed)
Caller name:Enos Relation to FH:[id number]  Call back number:[phone] Pharmacy:  Reason for call: Patient called regarding statement below from his cardiologist office:Please advise.    Goes back to January when he was told to stop Bystlic and increase Quinapril to 40mg  daily. While he was at [facility] 08/14/13 experienced an episode of tachycardia and lips swelling. (was also taking Bactrim and Flexeril) Went to ER at [facility] and was told it was angioedema from the Quinapril. They stopped the Quinapril and started Atacand 16mg  qpm. He has had several episodes of tachycardia with a heart rate @112  bpm took 2 xanax and problem resolved. He feels he needs to be back on a low cost beta blocker and low dose Quinapril. Told patient we need to make an appt w/Dr. Gwenlyn Found who he hasn't seen in over 6 months before he would change any meds. Patient voiced understanding but not sure he needs a cardiologist and he is seeing his PCP Friday (this was after talking for 22 minutes). He would like an appt unless Dr. Etter Sjogren is willing to treat, which he will find out Friday. Will send message to scheduling for an appt with Dr. Gwenlyn Found next available.

## 2013-08-20 NOTE — Telephone Encounter (Signed)
Goes back to January when he was told to stop Bystlic and increase Quinapril to 40mg  daily. While he was at [facility] 08/14/13 experienced an episode of tachycardia and lips swelling. (was also taking Bactrim and Flexeril)  Went to ER at [facility] and was told it was angioedema from the Quinapril.  They stopped the Quinapril and started Atacand 16mg  qpm.  He has had several episodes of tachycardia with a heart rate @112  bpm took 2 xanax and problem resolved.  He feels he needs to be back on a low cost beta blocker and low dose Quinapril.  Told patient we need to make an appt w/Dr. Gwenlyn Found who he hasn't seen in over 6 months before he would change any meds.  Patient voiced understanding but not sure he needs a cardiologist and he is seeing his PCP Friday (this was after talking for 22 minutes).  He would like an appt unless Dr. Etter Sjogren is willing to treat, which he will find out Friday.  Will send message to scheduling for an appt with Dr. Gwenlyn Found next available.

## 2013-08-23 ENCOUNTER — Ambulatory Visit: Payer: Medicare Other | Admitting: Family Medicine

## 2013-08-27 ENCOUNTER — Ambulatory Visit: Payer: Medicare Other | Admitting: Family Medicine

## 2013-08-28 ENCOUNTER — Encounter: Payer: Self-pay | Admitting: Cardiovascular Disease

## 2013-08-28 ENCOUNTER — Ambulatory Visit (INDEPENDENT_AMBULATORY_CARE_PROVIDER_SITE_OTHER): Payer: Medicare Other | Admitting: Cardiovascular Disease

## 2013-08-28 ENCOUNTER — Telehealth: Payer: Self-pay | Admitting: Family Medicine

## 2013-08-28 VITALS — BP 148/88 | HR 75

## 2013-08-28 DIAGNOSIS — I10 Essential (primary) hypertension: Secondary | ICD-10-CM

## 2013-08-28 DIAGNOSIS — E785 Hyperlipidemia, unspecified: Secondary | ICD-10-CM

## 2013-08-28 NOTE — Patient Instructions (Signed)
Your physician recommends that you schedule a follow-up appointment in: ONE YEAR with Dr. Berry.  

## 2013-08-28 NOTE — Assessment & Plan Note (Signed)
Well-controlled on current medications 

## 2013-08-28 NOTE — Progress Notes (Signed)
08/28/2013 Bruce Romero   02-Dec-1945  [id number]  Primary Physician Garnet Koyanagi, DO Primary Cardiologist: Lorretta Harp MD Renae Gloss   HPI:  Bruce Romero is a 68 year old moderately overweight married Caucasian male father of 66, confrontative her grandchildren who is retired from [employer]. He was formally a patient of Dr. Lowella Fairy. His history includes hypertension, diabetes and hyperlipidemia. His brother did have a stent in his heart in his 30s. He is never had a heart attack or stroke and denies chest pain or shortness of breath. He had a recent normal 2-D echo and Myoview stress test performed 01/12/11 was nonischemic.he had an episode of angioedema earlier this year and his blood pressure medicines were adjusted accordingly.     Current Outpatient Prescriptions  Medication Sig Dispense Refill  . albuterol (PROAIR HFA) 108 (90 BASE) MCG/ACT inhaler Inhale 2 puffs into the lungs every 6 (six) hours as needed for wheezing.  1 Inhaler  2  . ALPRAZolam (XANAX) 0.25 MG tablet 1 po tid prn  60 tablet  0  . amLODipine (NORVASC) 10 MG tablet Take 1 tablet (10 mg total) by mouth daily.  90 tablet  1  . atorvastatin (LIPITOR) 10 MG tablet Take 1 tablet (10 mg total) by mouth daily.  90 tablet  1  . candesartan (ATACAND) 16 MG tablet Take 16 mg by mouth at bedtime.      . Cholecalciferol (VITAMIN D) 2000 UNITS tablet Take 2,000 Units by mouth daily.        Marland Kitchen dicyclomine (BENTYL) 20 MG tablet prn      . esomeprazole (NEXIUM) 40 MG capsule Take 20 mg by mouth daily.      Marland Kitchen glimepiride (AMARYL) 4 MG tablet Take 1 tablet (4 mg total) by mouth daily.  90 tablet  1  . niacin (NIASPAN) 1000 MG CR tablet Take 1 tablet (1,000 mg total) by mouth at bedtime.  90 tablet  1  . SitaGLIPtin-MetFORMIN HCl (JANUMET XR) [phone] MG TB24 Take 1 tablet by mouth daily.  90 tablet  1  . tiZANidine (ZANAFLEX) 4 MG capsule Take 1 capsule (4 mg total) by mouth 3 (three) times daily  as needed for muscle spasms.  30 capsule  1  . [DISCONTINUED] citalopram (CELEXA) 10 MG tablet Take 1 tablet (10 mg total) by mouth daily.  90 tablet  0   No current facility-administered medications for this visit.    Allergies  Allergen Reactions  . Cephalexin     REACTION: pt is not allergic to penicillin    History   Social History  . Marital Status: Married    Spouse Name: N/A    Number of Children: N/A  . Years of Education: N/A   Occupational History  . Not on file.   Social History Main Topics  . Smoking status: Former Research scientist (life sciences)  . Smokeless tobacco: Never Used  . Alcohol Use: 0.0 oz/week    0 Cans of beer per week  . Drug Use: No  . Sexual Activity:    Other Topics Concern  . Not on file   Social History Narrative  . No narrative on file     Review of Systems: General: negative for chills, fever, night sweats or weight changes.  Cardiovascular: negative for chest pain, dyspnea on exertion, edema, orthopnea, palpitations, paroxysmal nocturnal dyspnea or shortness of breath Dermatological: negative for rash Respiratory: negative for cough or wheezing Urologic: negative for hematuria Abdominal: negative for nausea, vomiting, diarrhea,  bright red blood per rectum, melena, or hematemesis Neurologic: negative for visual changes, syncope, or dizziness All other systems reviewed and are otherwise negative except as noted above.    Blood pressure 148/88, pulse 75.  General appearance: alert and no distress Neck: no adenopathy, no carotid bruit, no JVD, supple, symmetrical, trachea midline and thyroid not enlarged, symmetric, no tenderness/mass/nodules Lungs: clear to auscultation bilaterally Heart: regular rate and rhythm, S1, S2 normal, no murmur, click, rub or gallop Extremities: extremities normal, atraumatic, no cyanosis or edema  EKG not performed today  ASSESSMENT AND PLAN:   HYPERLIPIDEMIA On statin therapy with his most recent lipid profile performed  04/30/13 revealing a total cholesterol of 138, LDL of 47 and HDL of 48.  ESSENTIAL HYPERTENSION, BENIGN Well-controlled on current medications      Lorretta Harp MD [facility], [facility] 08/28/2013 3:55 PM

## 2013-08-28 NOTE — Telephone Encounter (Signed)
Caller name: Kohle  Relation to pt: self Call back number:[phone] Pharmacy: [facility]   Reason for call:  Pt got a script of candesartan (ATACAND) 16 MG tablet from the [facility] wants pt to keep taking this medication;  And he needs this refilled.   Pt also wants a  30 day supply of xanax 0.5, states the .025 doesn't cut it.

## 2013-08-28 NOTE — Assessment & Plan Note (Signed)
On statin therapy with his most recent lipid profile performed 04/30/13 revealing a total cholesterol of 138, LDL of 47 and HDL of 48.

## 2013-08-29 MED ORDER — ALPRAZOLAM 0.25 MG PO TABS: ORAL_TABLET | ORAL | Status: DC

## 2013-08-29 MED ORDER — CANDESARTAN CILEXETIL 16 MG PO TABS: 16.0000 mg | ORAL_TABLET | Freq: Every day | ORAL | Status: DC

## 2013-08-29 NOTE — Telephone Encounter (Signed)
Spoke with patient who states that he was seen by cardio yesterday and they would like for him to remain on the [place]. He ask them not fill it because he thought he was seeing his PCP tomorrow but it was cancelled. States that he wants to get it from the same person because he doesn't go back to cardio for a year. States it's easier getting refills from just one provider. Requested a 90 day for mail order. Patient also questioned the alprazolam that he uses when a panic attack comes on. [place] had to use it at that time. States that he still has a few left but he likes to keep on hand. Ask if he could get the 0.5mg  due to the cost of the 0.25mg  amounting to the same. As far as strength being to weak per his request, advised that it could be due to age of medication. 0.25 was prescribed by Dr Birdie Riddle in 2013.  Please advise.

## 2013-08-29 NOTE — Telephone Encounter (Signed)
Ok for refill on atacand for #90, 1 refill As for Alprazolam, this is not a recommended medication for pt's >65 yrs of age so at this time, I will not increase dose.  I would be happy to refill 0.25mg  #30 or he can discuss this w/ Dr Etter Sjogren when she returns

## 2013-08-29 NOTE — Telephone Encounter (Signed)
Spoke with patient and advised sent Rx for atacand to [facility]. Ok with the 0.25. Printed and left for provider signature

## 2013-08-30 ENCOUNTER — Ambulatory Visit: Payer: Medicare Other | Admitting: Family Medicine

## 2013-09-16 ENCOUNTER — Encounter: Payer: Self-pay | Admitting: Medical

## 2013-09-16 ENCOUNTER — Ambulatory Visit (INDEPENDENT_AMBULATORY_CARE_PROVIDER_SITE_OTHER): Payer: Medicare Other | Admitting: Medical

## 2013-09-16 VITALS — BP 143/80 | Temp 98.3°F | Wt 218.8 lb

## 2013-09-16 DIAGNOSIS — R350 Frequency of micturition: Secondary | ICD-10-CM

## 2013-09-16 DIAGNOSIS — N410 Acute prostatitis: Secondary | ICD-10-CM

## 2013-09-16 LAB — POCT URINALYSIS DIPSTICK
Bilirubin, UA: NEGATIVE
GLUCOSE UA: NEGATIVE
Ketones, UA: NEGATIVE
Leukocytes, UA: NEGATIVE
Nitrite, UA: NEGATIVE
Protein, UA: NEGATIVE
RBC UA: NEGATIVE
SPEC GRAV UA: 1.01
Urobilinogen, UA: 0.2
pH, UA: 6

## 2013-09-16 MED ORDER — CIPROFLOXACIN HCL 500 MG PO TABS: 500.0000 mg | ORAL_TABLET | Freq: Two times a day (BID) | ORAL | Status: DC

## 2013-09-16 NOTE — Assessment & Plan Note (Signed)
With frequent urination and perineum pain and back pain. Suspect prostatitis. Will rx cipro and get poct ua and psa. Will follow closely.

## 2013-09-16 NOTE — Patient Instructions (Signed)
You appear to have prostatitis type symptoms. I am sending prescription of cipro to your pharmacy. Please get labs today. We will call you on results. When you start antibiotics you should start to feel progressively better if not please notify us. Follow up in 7-10 days or as needed.

## 2013-09-16 NOTE — Progress Notes (Signed)
Subjective:    Patient ID: Bruce Romero, male    DOB: 1945-11-19, 68 y.o.   MRN: [id number]  HPI  Pt in with some urinary frequency, perineum pain, buttox pain and back pain. No fevers or chills. History of prostatitis. He states a year or more since any psa check. Pt reports history of prostatitis. Las saw urologist 1.5 year ago. Pt never had prostate biopsy he states just treated with antibiotics. He has been urinating more frequently for 3-4 days. Then discomfort in above areas x 3-4 days.  Past Medical History  Diagnosis Date  . 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 Diabetes mellitus   . Hyperlipidemia   . Hypertension     History   Social History  . Marital Status: Married    Spouse Name: N/A    Number of Children: N/A  . Years of Education: N/A   Occupational History  . Not on file.   Social History Main Topics  . Smoking status: Former Research scientist (life sciences)  . Smokeless tobacco: Never Used  . Alcohol Use: 0.0 oz/week    0 Cans of beer per week  . Drug Use: No  . Sexual Activity:    Other Topics Concern  . Not on file   Social History Narrative  . No narrative on file    Past Surgical History  Procedure Laterality Date  . Cholecystectomy      Family History  Problem Relation Age of Onset  . Stroke Mother   . Pulmonary embolism Father     Allergies  Allergen Reactions  . Cephalexin     REACTION: pt is not allergic to penicillin    Current Outpatient Prescriptions on File Prior to Visit  Medication Sig Dispense Refill  . albuterol (PROAIR HFA) 108 (90 BASE) MCG/ACT inhaler Inhale 2 puffs into the lungs every 6 (six) hours as needed for wheezing.  1 Inhaler  2  . ALPRAZolam (XANAX) 0.25 MG tablet 1 po tid prn  30 tablet  0  . amLODipine (NORVASC) 10 MG tablet Take 1 tablet (10 mg total) by mouth daily.  90 tablet  1  . atorvastatin (LIPITOR) 10 MG tablet Take 1 tablet (10 mg total) by mouth daily.  90 tablet  1  . candesartan (ATACAND) 16 MG  tablet Take 1 tablet (16 mg total) by mouth at bedtime.  90 tablet  1  . Cholecalciferol (VITAMIN D) 2000 UNITS tablet Take 2,000 Units by mouth daily.        Marland Kitchen dicyclomine (BENTYL) 20 MG tablet prn      . esomeprazole (NEXIUM) 40 MG capsule Take 20 mg by mouth daily.      Marland Kitchen glimepiride (AMARYL) 4 MG tablet Take 1 tablet (4 mg total) by mouth daily.  90 tablet  1  . niacin (NIASPAN) 1000 MG CR tablet Take 1 tablet (1,000 mg total) by mouth at bedtime.  90 tablet  1  . tiZANidine (ZANAFLEX) 4 MG capsule Take 1 capsule (4 mg total) by mouth 3 (three) times daily as needed for muscle spasms.  30 capsule  1  . [DISCONTINUED] citalopram (CELEXA) 10 MG tablet Take 1 tablet (10 mg total) by mouth daily.  90 tablet  0   No current facility-administered medications on file prior to visit.    BP 143/80  Temp(Src) 98.3 F (36.8 C) (Oral)  Wt 218 lb 12.8 oz (99.247 kg)  SpO2 99%    Review of Systems  Constitutional: Negative for fever, chills and fatigue.  Respiratory: Negative for cough, choking and wheezing.   Cardiovascular: Negative for chest pain and palpitations.  Gastrointestinal: Negative for nausea, vomiting, abdominal pain, diarrhea, constipation, blood in stool and abdominal distention.  Genitourinary: Positive for dysuria, urgency and frequency. Negative for hematuria, flank pain, penile swelling, scrotal swelling, difficulty urinating, penile pain and testicular pain.  Musculoskeletal: Positive for back pain. Negative for arthralgias.       Lower back with buttock region pain and some perineum pain.  Skin: Negative for color change and rash.  Neurological: Negative.   Hematological: Negative for adenopathy. Does not bruise/bleed easily.       Objective:   Physical Exam  Constitutional: He is oriented to person, place, and time. He appears well-developed and well-nourished. No distress.  HENT:  Head: Normocephalic and atraumatic.  Eyes: Conjunctivae and EOM are normal. Pupils  are equal, round, and reactive to light.  Neck: Normal range of motion. Neck supple. No JVD present. No tracheal deviation present. No thyromegaly present.  Cardiovascular: Normal rate, regular rhythm and normal heart sounds.  Exam reveals no gallop and no friction rub.   No murmur heard. Pulmonary/Chest: Effort normal and breath sounds normal. No stridor. No respiratory distress. He has no wheezes. He has no rales. He exhibits no tenderness.  Abdominal: Bowel sounds are normal. He exhibits no distension and no mass. There is no tenderness. There is no rebound and no guarding.  Genitourinary: Rectum normal, prostate normal and penis normal. No penile tenderness.  No nodules felt on prostate. Smooth.  Lymphadenopathy:    He has no cervical adenopathy.  Neurological: He is alert and oriented to person, place, and time. He has normal reflexes.  Skin: He is not diaphoretic.  Psychiatric: He has a normal mood and affect. His behavior is normal. Judgment and thought content normal.          Assessment & Plan:

## 2013-09-17 LAB — PSA, MEDICARE: PSA: 0.71 ng/ml (ref 0.10–4.00)

## 2013-09-28 ENCOUNTER — Encounter: Payer: Self-pay | Admitting: Family Medicine

## 2013-09-30 NOTE — Telephone Encounter (Signed)
Called pt to remind pt to schedule DM follow up appointment. Pt stated they will call back in September.

## 2013-10-02 ENCOUNTER — Encounter: Payer: Self-pay | Admitting: Family Medicine

## 2013-10-02 ENCOUNTER — Telehealth: Payer: Self-pay

## 2013-10-02 MED ORDER — GLUCOSE BLOOD VI STRP: ORAL_STRIP | Status: DC

## 2013-10-02 NOTE — Telephone Encounter (Signed)
Rx faxed.    KP 

## 2013-10-02 NOTE — Telephone Encounter (Signed)
Lamoyne Hessel [phone] [facility] [place]  [facility] called he would like for [place] to call his test strips to [facility] and he would like 50 strips instead of 100 Precision Xtra is brand

## 2013-10-21 ENCOUNTER — Telehealth: Payer: Self-pay | Admitting: *Deleted

## 2013-10-21 NOTE — Telephone Encounter (Signed)
Pt did not want to come in for BP recheck, stating 'I've seen my cardiologist this year'

## 2013-11-15 ENCOUNTER — Telehealth: Payer: Self-pay | Admitting: Family Medicine

## 2013-11-15 DIAGNOSIS — E785 Hyperlipidemia, unspecified: Secondary | ICD-10-CM

## 2013-11-15 DIAGNOSIS — E119 Type 2 diabetes mellitus without complications: Secondary | ICD-10-CM

## 2013-11-15 NOTE — Telephone Encounter (Signed)
lvm for pt to cb and schedule labs

## 2013-11-15 NOTE — Telephone Encounter (Signed)
Caller name: Phares Relation to pt: Call back number:[phone]   Reason for call:  Pt is wanting orders for 6 mo a1c and urine protein check.  Can we get orders placed?

## 2013-11-15 NOTE — Telephone Encounter (Signed)
Orders are in. Please schedule .       KP 

## 2013-11-18 ENCOUNTER — Telehealth: Payer: Self-pay

## 2013-11-18 ENCOUNTER — Other Ambulatory Visit (INDEPENDENT_AMBULATORY_CARE_PROVIDER_SITE_OTHER): Payer: Medicare Other

## 2013-11-18 DIAGNOSIS — E785 Hyperlipidemia, unspecified: Secondary | ICD-10-CM

## 2013-11-18 DIAGNOSIS — E119 Type 2 diabetes mellitus without complications: Secondary | ICD-10-CM

## 2013-11-18 DIAGNOSIS — Z23 Encounter for immunization: Secondary | ICD-10-CM

## 2013-11-18 LAB — BASIC METABOLIC PANEL WITH GFR
BUN: 11 mg/dL (ref 6–23)
CO2: 24 meq/L (ref 19–32)
Calcium: 8.7 mg/dL (ref 8.4–10.5)
Chloride: 104 meq/L (ref 96–112)
Creatinine, Ser: 1 mg/dL (ref 0.4–1.5)
GFR: 81.49 mL/min
Glucose, Bld: 150 mg/dL — ABNORMAL HIGH (ref 70–99)
Potassium: 4 meq/L (ref 3.5–5.1)
Sodium: 138 meq/L (ref 135–145)

## 2013-11-18 LAB — HEMOGLOBIN A1C: Hgb A1c MFr Bld: 6.6 % — ABNORMAL HIGH (ref 4.6–6.5)

## 2013-11-18 LAB — LIPID PANEL
Cholesterol: 109 mg/dL (ref 0–200)
HDL: 49.1 mg/dL (ref >39.00)
LDL Cholesterol: 40 mg/dL (ref 0–99)
NonHDL: 59.9
Total CHOL/HDL Ratio: 2
Triglycerides: 100 mg/dL (ref 0.0–149.0)
VLDL: 20 mg/dL (ref 0.0–40.0)

## 2013-11-18 LAB — HEPATIC FUNCTION PANEL
ALT: 36 U/L (ref 0–53)
AST: 47 U/L — ABNORMAL HIGH (ref 0–37)
Albumin: 3.6 g/dL (ref 3.5–5.2)
Alkaline Phosphatase: 48 U/L (ref 39–117)
Bilirubin, Direct: 0.1 mg/dL (ref 0.0–0.3)
Total Bilirubin: 0.6 mg/dL (ref 0.2–1.2)
Total Protein: 7.5 g/dL (ref 6.0–8.3)

## 2013-11-18 LAB — MICROALBUMIN / CREATININE URINE RATIO
Creatinine,U: 190.8 mg/dL
Microalb Creat Ratio: 0.2 mg/g (ref 0.0–30.0)
Microalb, Ur: 0.4 mg/dL (ref 0.0–1.9)

## 2013-11-18 MED ORDER — AMLODIPINE BESYLATE 10 MG PO TABS: 10.0000 mg | ORAL_TABLET | Freq: Every day | ORAL | Status: DC

## 2013-11-18 MED ORDER — ATORVASTATIN CALCIUM 10 MG PO TABS: 10.0000 mg | ORAL_TABLET | Freq: Every day | ORAL | Status: DC

## 2013-11-18 MED ORDER — GLIMEPIRIDE 4 MG PO TABS: 4.0000 mg | ORAL_TABLET | Freq: Every day | ORAL | Status: DC

## 2013-11-18 NOTE — Telephone Encounter (Signed)
Patient came by the office and stated that Atacand 16 mg is not covered by his new insurance plan/not on formulary and they have already made him aware that her can either change to Valsartan or Losartan as alternatives. He would like to know which one he could take.

## 2013-11-20 ENCOUNTER — Encounter: Payer: Self-pay | Admitting: Family Medicine

## 2013-12-27 ENCOUNTER — Other Ambulatory Visit: Payer: Self-pay | Admitting: Family Medicine

## 2014-01-16 ENCOUNTER — Encounter: Payer: Self-pay | Admitting: Family Medicine

## 2014-01-21 ENCOUNTER — Ambulatory Visit (INDEPENDENT_AMBULATORY_CARE_PROVIDER_SITE_OTHER): Payer: Medicare Other | Admitting: Family Medicine

## 2014-01-21 ENCOUNTER — Encounter: Payer: Self-pay | Admitting: Family Medicine

## 2014-01-21 VITALS — BP 132/80 | HR 74 | Temp 97.4°F | Wt 217.8 lb

## 2014-01-21 DIAGNOSIS — I10 Essential (primary) hypertension: Secondary | ICD-10-CM | POA: Diagnosis not present

## 2014-01-21 DIAGNOSIS — E785 Hyperlipidemia, unspecified: Secondary | ICD-10-CM | POA: Diagnosis not present

## 2014-01-21 DIAGNOSIS — IMO0002 Reserved for concepts with insufficient information to code with codable children: Secondary | ICD-10-CM

## 2014-01-21 DIAGNOSIS — E1165 Type 2 diabetes mellitus with hyperglycemia: Secondary | ICD-10-CM

## 2014-01-21 DIAGNOSIS — G2581 Restless legs syndrome: Secondary | ICD-10-CM

## 2014-01-21 DIAGNOSIS — H60393 Other infective otitis externa, bilateral: Secondary | ICD-10-CM | POA: Diagnosis not present

## 2014-01-21 DIAGNOSIS — Z23 Encounter for immunization: Secondary | ICD-10-CM

## 2014-01-21 MED ORDER — ROPINIROLE HCL 0.5 MG PO TABS: 0.5000 mg | ORAL_TABLET | Freq: Every day | ORAL | Status: DC

## 2014-01-21 MED ORDER — CANDESARTAN CILEXETIL 16 MG PO TABS: 16.0000 mg | ORAL_TABLET | Freq: Every day | ORAL | Status: DC

## 2014-01-21 MED ORDER — OFLOXACIN 0.3 % OT SOLN: 10.0000 [drp] | Freq: Every day | OTIC | Status: DC

## 2014-01-21 NOTE — Patient Instructions (Signed)

## 2014-01-21 NOTE — Progress Notes (Signed)
Pre visit review using our clinic review tool, if applicable. No additional management support is needed unless otherwise documented below in the visit note. 

## 2014-01-21 NOTE — Progress Notes (Signed)
  Subjective:     Bruce Romero is a 68 y.o. male who presents for evaluation of symptoms of a URI. Symptoms include bilateral ear pressure/pain, nasal congestion and sore throat. Onset of symptoms was a few weeks ago, and has been gradually worsening since that time. Treatment to date: antihistamines and cough suppressants.   HPI HYPERTENSION  Blood pressure range-good  Chest pain- no      Dyspnea- no Lightheadedness- no   Edema- no Other side effects - no   Medication compliance: good Low salt diet- yes  DIABETES  Blood Sugar ranges-good--- ran too low on metformin 1000 mg bid-- pt changed it to 1000 in am and 500 at night  Polyuria- no New Visual problems- no Hypoglycemic symptoms- not since lowering dose Other side effects- Medication compliance - good Last eye exam-last 6 months Foot exam- today  HYPERLIPIDEMIA  Medication compliance- good RUQ pain- no  Muscle aches- no Other side effects-no      The following portions of the patient's history were reviewed and updated as appropriate: allergies, current medications, past family history, past medical history, past social history, past surgical history and problem list.  Review of Systems Pertinent items are noted in HPI.   History   Social History  . Marital Status: Married    Spouse Name: N/A    Number of Children: N/A  . Years of Education: N/A   Occupational History  . Not on file.   Social History Main Topics  . Smoking status: Former Research scientist (life sciences)  . Smokeless tobacco: Never Used  . Alcohol Use: 0.0 oz/week    0 Cans of beer per week  . Drug Use: No  . Sexual Activity: Not on file   Other Topics Concern  . Not on file   Social History Narrative    Objective:    BP 132/80 mmHg  Pulse 74  Temp(Src) 97.4 F (36.3 C) (Oral)  Wt 217 lb 12.8 oz (98.793 kg)  SpO2 96% General appearance: alert, cooperative, appears stated age and no distress Ears: b/l cerumen impaction--  unable to remove with  hoop-- lrrigated successfully-- canals red,  tm normal Nose: clear discharge, mild congestion, turbinates red, swollen Throat: lips, mucosa, and tongue normal; teeth and gums normal Neck: no adenopathy, supple, symmetrical, trachea midline and thyroid not enlarged, symmetric, no tenderness/mass/nodules Lungs: clear to auscultation bilaterally Heart: S1, S2 normal Extremities: extremities normal, atraumatic, no cyanosis or edema  Sensory exam of the foot is normal, tested with the monofilament. Good pulses, no lesions or ulcers, good peripheral pulses.  Assessment:    viral upper respiratory illness   Plan:    Discussed diagnosis and treatment of URI. Suggested symptomatic OTC remedies. Nasal saline spray for congestion. Follow up as needed.    1. Otitis, externa, infective, bilateral After irrigation to remove wax  - ofloxacin (FLOXIN) 0.3 % otic solution; Place 10 drops into both ears daily.  Dispense: 10 mL; Refill: 0  2. Essential hypertension Stable, con't meds   3. Diabetes mellitus type II, uncontrolled Check labs in Jan  4. Hyperlipidemia Check labs, con't meds 5.  RLS-- requip --f/u 6 months or sooner prn

## 2014-02-24 DIAGNOSIS — R0781 Pleurodynia: Secondary | ICD-10-CM | POA: Diagnosis not present

## 2014-02-24 DIAGNOSIS — M7542 Impingement syndrome of left shoulder: Secondary | ICD-10-CM | POA: Diagnosis not present

## 2014-02-26 DIAGNOSIS — M546 Pain in thoracic spine: Secondary | ICD-10-CM | POA: Diagnosis not present

## 2014-02-26 DIAGNOSIS — M25512 Pain in left shoulder: Secondary | ICD-10-CM | POA: Diagnosis not present

## 2014-03-04 DIAGNOSIS — M546 Pain in thoracic spine: Secondary | ICD-10-CM | POA: Diagnosis not present

## 2014-03-04 DIAGNOSIS — M25512 Pain in left shoulder: Secondary | ICD-10-CM | POA: Diagnosis not present

## 2014-03-06 DIAGNOSIS — M25512 Pain in left shoulder: Secondary | ICD-10-CM | POA: Diagnosis not present

## 2014-03-06 DIAGNOSIS — M546 Pain in thoracic spine: Secondary | ICD-10-CM | POA: Diagnosis not present

## 2014-03-11 DIAGNOSIS — M25512 Pain in left shoulder: Secondary | ICD-10-CM | POA: Diagnosis not present

## 2014-03-11 DIAGNOSIS — M546 Pain in thoracic spine: Secondary | ICD-10-CM | POA: Diagnosis not present

## 2014-03-13 DIAGNOSIS — M25512 Pain in left shoulder: Secondary | ICD-10-CM | POA: Diagnosis not present

## 2014-03-13 DIAGNOSIS — M546 Pain in thoracic spine: Secondary | ICD-10-CM | POA: Diagnosis not present

## 2014-03-18 DIAGNOSIS — M25512 Pain in left shoulder: Secondary | ICD-10-CM | POA: Diagnosis not present

## 2014-03-18 DIAGNOSIS — M546 Pain in thoracic spine: Secondary | ICD-10-CM | POA: Diagnosis not present

## 2014-03-20 DIAGNOSIS — M25512 Pain in left shoulder: Secondary | ICD-10-CM | POA: Diagnosis not present

## 2014-03-20 DIAGNOSIS — M546 Pain in thoracic spine: Secondary | ICD-10-CM | POA: Diagnosis not present

## 2014-03-25 ENCOUNTER — Telehealth: Payer: Self-pay

## 2014-03-25 DIAGNOSIS — M546 Pain in thoracic spine: Secondary | ICD-10-CM | POA: Diagnosis not present

## 2014-03-25 DIAGNOSIS — M25512 Pain in left shoulder: Secondary | ICD-10-CM | POA: Diagnosis not present

## 2014-03-25 NOTE — Telephone Encounter (Signed)
Date: 03/22/14 Nurse: Marcy Salvo, RN  Chief Complaint: Vomiting Comment:  Caller says that her father started feeling nauseated around 1 AM and has had vomiting and diarrhea.  Last urination 2, 3 hours ago.     Reason for calling:  Wife is calling for husband and is nauseated around 1 am and has had vomiting and diarrhea.  Last urination 2,3 hours ago.  Started at 1 am has been eating ice chips and pedilyght.  Dicyclomine: regular med given xanax .25 skelaxin muscle relaxer.  Ate pizza with ham on it last.  Last urine was 11:45 am fasting 110 glucose test.  Q1-2 hours since 1 am had a vomit last vomit was 9 am.   Disposition:  Call completed  Care Advice:  GO TO ED NOW (or PCP Triage): Bring medicines: Container: You may wish to bring a bucket or container with you in case there is more vomiting during the drive.

## 2014-03-25 NOTE — Telephone Encounter (Signed)
Pt stated that he did not go to the ER as advised.  He stated that by the time he called, the worse of the symptoms were almost over, so he continued to drink Pedialyte and symptoms soon resolved. He stated that he is feeling much better today and does not need an appointment at this time.

## 2014-03-26 DIAGNOSIS — L570 Actinic keratosis: Secondary | ICD-10-CM | POA: Diagnosis not present

## 2014-03-27 DIAGNOSIS — M25512 Pain in left shoulder: Secondary | ICD-10-CM | POA: Diagnosis not present

## 2014-03-27 DIAGNOSIS — M546 Pain in thoracic spine: Secondary | ICD-10-CM | POA: Diagnosis not present

## 2014-04-21 ENCOUNTER — Other Ambulatory Visit: Payer: Self-pay

## 2014-04-22 ENCOUNTER — Other Ambulatory Visit (INDEPENDENT_AMBULATORY_CARE_PROVIDER_SITE_OTHER): Payer: Medicare Other

## 2014-04-22 DIAGNOSIS — I10 Essential (primary) hypertension: Secondary | ICD-10-CM | POA: Diagnosis not present

## 2014-04-22 DIAGNOSIS — E785 Hyperlipidemia, unspecified: Secondary | ICD-10-CM

## 2014-04-22 DIAGNOSIS — E1165 Type 2 diabetes mellitus with hyperglycemia: Secondary | ICD-10-CM | POA: Diagnosis not present

## 2014-04-22 DIAGNOSIS — IMO0002 Reserved for concepts with insufficient information to code with codable children: Secondary | ICD-10-CM

## 2014-04-22 LAB — BASIC METABOLIC PANEL
BUN: 12 mg/dL (ref 6–23)
CHLORIDE: 98 meq/L (ref 96–112)
CO2: 29 mEq/L (ref 19–32)
Calcium: 9.1 mg/dL (ref 8.4–10.5)
Creatinine, Ser: 1.07 mg/dL (ref 0.40–1.50)
GFR: 72.68 mL/min (ref >60.00)
Glucose, Bld: 155 mg/dL — ABNORMAL HIGH (ref 70–99)
Potassium: 3.9 mEq/L (ref 3.5–5.1)
SODIUM: 134 meq/L — AB (ref 135–145)

## 2014-04-22 LAB — POCT URINALYSIS DIPSTICK
Bilirubin, UA: NEGATIVE
GLUCOSE UA: NEGATIVE
Ketones, UA: NEGATIVE
Leukocytes, UA: NEGATIVE
NITRITE UA: NEGATIVE
Protein, UA: NEGATIVE
RBC UA: NEGATIVE
SPEC GRAV UA: 1.02
Urobilinogen, UA: 0.2
pH, UA: 6

## 2014-04-22 LAB — HEPATIC FUNCTION PANEL
ALBUMIN: 4.1 g/dL (ref 3.5–5.2)
ALK PHOS: 50 U/L (ref 39–117)
ALT: 32 U/L (ref 0–53)
AST: 38 U/L — ABNORMAL HIGH (ref 0–37)
Bilirubin, Direct: 0.2 mg/dL (ref 0.0–0.3)
TOTAL PROTEIN: 6.7 g/dL (ref 6.0–8.3)
Total Bilirubin: 0.7 mg/dL (ref 0.2–1.2)

## 2014-04-22 LAB — LIPID PANEL
CHOLESTEROL: 99 mg/dL (ref 0–200)
HDL: 42.3 mg/dL (ref >39.00)
LDL Cholesterol: 28 mg/dL (ref 0–99)
NonHDL: 56.7
Total CHOL/HDL Ratio: 2
Triglycerides: 146 mg/dL (ref 0.0–149.0)
VLDL: 29.2 mg/dL (ref 0.0–40.0)

## 2014-04-22 LAB — MICROALBUMIN / CREATININE URINE RATIO
CREATININE, U: 158.8 mg/dL
MICROALB UR: 0.1 mg/dL (ref 0.0–1.9)
MICROALB/CREAT RATIO: 0.1 mg/g (ref 0.0–30.0)

## 2014-04-22 LAB — HEMOGLOBIN A1C: Hgb A1c MFr Bld: 6.9 % — ABNORMAL HIGH (ref 4.6–6.5)

## 2014-05-09 ENCOUNTER — Encounter: Payer: Self-pay | Admitting: Family Medicine

## 2014-05-09 ENCOUNTER — Ambulatory Visit (INDEPENDENT_AMBULATORY_CARE_PROVIDER_SITE_OTHER): Payer: Medicare Other | Admitting: Family Medicine

## 2014-05-09 VITALS — BP 122/80 | HR 99 | Temp 97.4°F | Wt 222.6 lb

## 2014-05-09 DIAGNOSIS — R05 Cough: Secondary | ICD-10-CM | POA: Diagnosis not present

## 2014-05-09 DIAGNOSIS — E785 Hyperlipidemia, unspecified: Secondary | ICD-10-CM

## 2014-05-09 DIAGNOSIS — G4700 Insomnia, unspecified: Secondary | ICD-10-CM

## 2014-05-09 DIAGNOSIS — F411 Generalized anxiety disorder: Secondary | ICD-10-CM

## 2014-05-09 DIAGNOSIS — E1165 Type 2 diabetes mellitus with hyperglycemia: Secondary | ICD-10-CM

## 2014-05-09 DIAGNOSIS — R059 Cough, unspecified: Secondary | ICD-10-CM

## 2014-05-09 DIAGNOSIS — J309 Allergic rhinitis, unspecified: Secondary | ICD-10-CM | POA: Insufficient documentation

## 2014-05-09 DIAGNOSIS — J302 Other seasonal allergic rhinitis: Secondary | ICD-10-CM

## 2014-05-09 DIAGNOSIS — IMO0002 Reserved for concepts with insufficient information to code with codable children: Secondary | ICD-10-CM

## 2014-05-09 MED ORDER — ALPRAZOLAM 0.25 MG PO TABS: ORAL_TABLET | ORAL | Status: DC

## 2014-05-09 MED ORDER — SITAGLIP PHOS-METFORMIN HCL ER 100-1000 MG PO TB24: ORAL_TABLET | ORAL | Status: DC

## 2014-05-09 MED ORDER — AZELASTINE-FLUTICASONE 137-50 MCG/ACT NA SUSP: NASAL | Status: DC

## 2014-05-09 MED ORDER — HYDROCOD POLST-CHLORPHEN POLST 10-8 MG/5ML PO LQCR: 5.0000 mL | Freq: Two times a day (BID) | ORAL | Status: DC | PRN

## 2014-05-09 NOTE — Assessment & Plan Note (Signed)
Stop metformin--- pt says it started with metformin but he never had a problem with janumet--even though metformin is in it Recheck labs 3 months

## 2014-05-09 NOTE — Progress Notes (Signed)
Pre visit review using our clinic review tool, if applicable. No additional management support is needed unless otherwise documented below in the visit note. 

## 2014-05-09 NOTE — Patient Instructions (Signed)

## 2014-05-09 NOTE — Assessment & Plan Note (Signed)
con't antihistamine Steroid nasal spray rto prn

## 2014-05-09 NOTE — Progress Notes (Signed)
Patient ID: Bruce Romero, male    DOB: December 06, 1945  Age: 69 y.o. MRN: [id number]    Subjective:  Subjective HPI Bruce Romero presents c/o insomnia and he thinks it s the metformin-- he never had a problem with janumet and would like to go back to that.    She also now has a cough.  + dry hacking cough with tickle in throat.  He is taking --- nasonex and neti pot.   Pt is also taking zyrtec.   Review of Systems  Constitutional: Positive for fatigue. Negative for unexpected weight change.  Respiratory: Positive for cough. Negative for chest tightness, shortness of breath and wheezing.   Cardiovascular: Negative for chest pain and palpitations.  Psychiatric/Behavioral: Positive for sleep disturbance. Negative for suicidal ideas, dysphoric mood and decreased concentration. The patient is nervous/anxious.     History Past Medical History  Diagnosis Date  . 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 Diabetes mellitus   . Hyperlipidemia   . Hypertension     He has past surgical history that includes Cholecystectomy.   His family history includes Pulmonary embolism in his father; Stroke in his mother.He reports that he has quit smoking. He has never used smokeless tobacco. He reports that he drinks alcohol. He reports that he does not use illicit drugs.  Current Outpatient Prescriptions on File Prior to Visit  Medication Sig Dispense Refill  . albuterol (PROAIR HFA) 108 (90 BASE) MCG/ACT inhaler Inhale 2 puffs into the lungs every 6 (six) hours as needed for wheezing. 1 Inhaler 2  . amLODipine (NORVASC) 10 MG tablet Take 1 tablet (10 mg total) by mouth daily. 90 tablet 1  . atorvastatin (LIPITOR) 10 MG tablet Take 1 tablet (10 mg total) by mouth daily. 90 tablet 1  . candesartan (ATACAND) 16 MG tablet Take 1 tablet (16 mg total) by mouth at bedtime. 90 tablet 1  . Cholecalciferol (VITAMIN D) 2000 UNITS tablet Take 2,000 Units by mouth daily.      Marland Kitchen dicyclomine (BENTYL) 20  MG tablet take 1 tablet by mouth every 6 hours 60 tablet 1  . esomeprazole (NEXIUM) 40 MG capsule Take 20 mg by mouth daily.    Marland Kitchen glimepiride (AMARYL) 4 MG tablet Take 1 tablet (4 mg total) by mouth daily. 90 tablet 1  . glucose blood test strip Precision Xtra test strips. Check blood sugar once daily. Dx:250.00 100 each 12  . niacin (SLO-NIACIN) 500 MG tablet Take 500 mg by mouth at bedtime.    Marland Kitchen ofloxacin (FLOXIN) 0.3 % otic solution Place 10 drops into both ears daily. 10 mL 0  . rOPINIRole (REQUIP) 0.5 MG tablet Take 1 tablet (0.5 mg total) by mouth at bedtime. 30 tablet 5  . tiZANidine (ZANAFLEX) 4 MG capsule Take 1 capsule (4 mg total) by mouth 3 (three) times daily as needed for muscle spasms. 30 capsule 1  . [DISCONTINUED] citalopram (CELEXA) 10 MG tablet Take 1 tablet (10 mg total) by mouth daily. 90 tablet 0   No current facility-administered medications on file prior to visit.     Objective:  Objective Physical Exam  Constitutional: He appears well-developed and well-nourished. No distress.  HENT:  Head: Normocephalic.  Right Ear: External ear normal.  Left Ear: External ear normal.  Nose: Nose normal.  Mouth/Throat: Oropharynx is clear and moist. No oropharyngeal exudate.  Neck: Normal range of motion. Neck supple.  Cardiovascular: Normal rate, regular rhythm and normal heart  sounds.   Pulmonary/Chest: Effort normal and breath sounds normal. No respiratory distress. He has no wheezes. He has no rales.  Lymphadenopathy:    He has no cervical adenopathy.  Psychiatric: He has a normal mood and affect. His behavior is normal. Judgment and thought content normal.   BP 122/80 mmHg  Pulse 99  Temp(Src) 97.4 F (36.3 C) (Oral)  Wt 222 lb 9.6 oz (100.971 kg)  SpO2 97% Wt Readings from Last 3 Encounters:  05/09/14 222 lb 9.6 oz (100.971 kg)  01/21/14 217 lb 12.8 oz (98.793 kg)  09/16/13 218 lb 12.8 oz (99.247 kg)     Lab Results  Component Value Date   WBC 6.8  08/31/2011   HGB 15.0 08/31/2011   HCT 44.3 08/31/2011   PLT 158.0 08/31/2011   GLUCOSE 155* 04/22/2014   CHOL 99 04/22/2014   TRIG 146.0 04/22/2014   HDL 42.30 04/22/2014   LDLCALC 28 04/22/2014   ALT 32 04/22/2014   AST 38* 04/22/2014   NA 134* 04/22/2014   K 3.9 04/22/2014   CL 98 04/22/2014   CREATININE 1.07 04/22/2014   BUN 12 04/22/2014   CO2 29 04/22/2014   TSH 0.979 12/30/2010   PSA 0.71 09/16/2013   HGBA1C 6.9* 04/22/2014   MICROALBUR 0.1 04/22/2014    No results found.   Assessment & Plan:  Plan I have discontinued Mr. Kadlec metFORMIN. I am also having him start on SitaGLIPtin-MetFORMIN HCl, Azelastine-Fluticasone, and chlorpheniramine-HYDROcodone. Additionally, I am having him maintain his Vitamin D, albuterol, tiZANidine, esomeprazole, glucose blood, atorvastatin, glimepiride, amLODipine, dicyclomine, niacin, candesartan, ofloxacin, rOPINIRole, and ALPRAZolam.  Meds ordered this encounter  Medications  . ALPRAZolam (XANAX) 0.25 MG tablet    Sig: 1 po tid prn    Dispense:  30 tablet    Refill:  0  . SitaGLIPtin-MetFORMIN HCl (JANUMET XR) [phone] MG TB24    Sig: 1 po qd    Dispense:  90 tablet    Refill:  3  . Azelastine-Fluticasone (DYMISTA) 137-50 MCG/ACT SUSP    Sig: 1 spray each nostril bid    Dispense:  1 Bottle    Refill:  5  . chlorpheniramine-HYDROcodone (TUSSIONEX) 10-8 MG/5ML LQCR    Sig: Take 5 mLs by mouth every 12 (twelve) hours as needed for cough.    Dispense:  473 mL    Refill:  0    Problem List Items Addressed This Visit    Allergic rhinitis    con't antihistamine Steroid nasal spray rto prn      Insomnia    Stop metformin--- pt says it started with metformin but he never had a problem with janumet--even though metformin is in it Recheck labs 3 months       Other Visit Diagnoses    Generalized anxiety disorder    -  Primary    Relevant Medications    ALPRAZolam  (XANAX) tablet    Diabetes mellitus type II,  uncontrolled        Relevant Medications    SitaGLIPtin-MetFORMIN HCl (JANUMET XR) [phone] MG TB24    Other Relevant Orders    Basic metabolic panel    Hemoglobin A1c    Cough        Relevant Medications    chlorpheniramine-HYDROcodone (TUSSIONEX) 10-8 MG/5ML LQCR    Hyperlipidemia LDL goal <70        Relevant Orders    Hepatic function panel    Lipid panel       Follow-up: Return in about  3 months (around 08/08/2014), or if symptoms worsen or fail to improve, for f/u and labs.  Garnet Koyanagi, DO

## 2014-05-10 ENCOUNTER — Encounter: Payer: Self-pay | Admitting: Family Medicine

## 2014-05-15 ENCOUNTER — Encounter: Payer: Self-pay | Admitting: Family Medicine

## 2014-05-15 ENCOUNTER — Ambulatory Visit (INDEPENDENT_AMBULATORY_CARE_PROVIDER_SITE_OTHER): Payer: Medicare Other | Admitting: Family Medicine

## 2014-05-15 VITALS — BP 132/80 | HR 91 | Temp 98.0°F | Wt 222.0 lb

## 2014-05-15 DIAGNOSIS — J9801 Acute bronchospasm: Secondary | ICD-10-CM

## 2014-05-15 DIAGNOSIS — J208 Acute bronchitis due to other specified organisms: Secondary | ICD-10-CM

## 2014-05-15 MED ORDER — MOMETASONE FURO-FORMOTEROL FUM 100-5 MCG/ACT IN AERO: 2.0000 | INHALATION_SPRAY | Freq: Two times a day (BID) | RESPIRATORY_TRACT | Status: DC

## 2014-05-15 MED ORDER — ALBUTEROL SULFATE HFA 108 (90 BASE) MCG/ACT IN AERS: 2.0000 | INHALATION_SPRAY | Freq: Four times a day (QID) | RESPIRATORY_TRACT | Status: DC | PRN

## 2014-05-15 NOTE — Patient Instructions (Signed)

## 2014-05-15 NOTE — Progress Notes (Signed)
  Subjective:     Bruce Romero is a 69 y.o. male here for evaluation of a cough. Onset of symptoms was 1 week ago. Symptoms have been gradually worsening since that time. The cough is dry and is aggravated by pollens and reclining position. Associated symptoms include: postnasal drip, shortness of breath and wheezing. Patient does not have a history of asthma. Patient does have a history of environmental allergens. Patient has not traveled recently. Patient does not have a history of smoking. Patient has not had a previous chest x-ray. Patient has not had a PPD done.  The following portions of the patient's history were reviewed and updated as appropriate: allergies, current medications, past family history, past medical history, past social history, past surgical history and problem list.  Review of Systems Pertinent items are noted in HPI.    Objective:    Oxygen saturation 95% on room air BP 132/80 mmHg  Pulse 91  Temp(Src) 98 F (36.7 C) (Oral)  Wt 222 lb (100.699 kg)  SpO2 95% General appearance: alert, cooperative, appears stated age and no distress Ears: normal TM's and external ear canals both ears Nose: Nares normal. Septum midline. Mucosa normal. No drainage or sinus tenderness. Throat: lips, mucosa, and tongue normal; teeth and gums normal Neck: no adenopathy, supple, symmetrical, trachea midline and thyroid not enlarged, symmetric, no tenderness/mass/nodules Lungs: wheezes bilaterally Heart: regular rate and rhythm, S1, S2 normal, no murmur, click, rub or gallop    Assessment:    Acute Bronchitis and Asthma    Plan:    Explained lack of efficacy of antibiotics in viral disease. Antitussives per medication orders. B-agonist inhaler. Call if shortness of breath worsens, blood in sputum, change in character of cough, development of fever or chills, inability to maintain nutrition and hydration. Avoid exposure to tobacco smoke and fumes. Steroid inhaler as ordered.

## 2014-05-15 NOTE — Progress Notes (Signed)
Pre visit review using our clinic review tool, if applicable. No additional management support is needed unless otherwise documented below in the visit note. 

## 2014-05-19 ENCOUNTER — Other Ambulatory Visit: Payer: Self-pay

## 2014-05-19 ENCOUNTER — Encounter: Payer: Self-pay | Admitting: Family Medicine

## 2014-05-19 ENCOUNTER — Other Ambulatory Visit: Payer: Self-pay | Admitting: Family Medicine

## 2014-05-19 DIAGNOSIS — J0110 Acute frontal sinusitis, unspecified: Secondary | ICD-10-CM

## 2014-05-19 MED ORDER — ATORVASTATIN CALCIUM 10 MG PO TABS: 10.0000 mg | ORAL_TABLET | Freq: Every day | ORAL | Status: DC

## 2014-05-19 MED ORDER — AMLODIPINE BESYLATE 10 MG PO TABS: 10.0000 mg | ORAL_TABLET | Freq: Every day | ORAL | Status: DC

## 2014-05-19 MED ORDER — AMOXICILLIN-POT CLAVULANATE 875-125 MG PO TABS: 1.0000 | ORAL_TABLET | Freq: Two times a day (BID) | ORAL | Status: DC

## 2014-05-19 MED ORDER — GLIMEPIRIDE 4 MG PO TABS: 4.0000 mg | ORAL_TABLET | Freq: Every day | ORAL | Status: DC

## 2014-05-26 ENCOUNTER — Telehealth: Payer: Self-pay | Admitting: *Deleted

## 2014-05-26 NOTE — Telephone Encounter (Signed)
Prior authorization for [facility] initiated. Awaiting determination. JG//CMA

## 2014-05-26 NOTE — Telephone Encounter (Signed)
PA approved through 05/26/2015. JG//CMA

## 2014-06-11 ENCOUNTER — Encounter: Payer: Self-pay | Admitting: Family Medicine

## 2014-06-11 DIAGNOSIS — M65352 Trigger finger, left little finger: Secondary | ICD-10-CM | POA: Diagnosis not present

## 2014-06-23 MED ORDER — PITAVASTATIN CALCIUM 2 MG PO TABS: 1.0000 | ORAL_TABLET | Freq: Every day | ORAL | Status: DC

## 2014-06-23 NOTE — Telephone Encounter (Signed)
Which dose of Lipitor?     KP

## 2014-06-23 NOTE — Telephone Encounter (Signed)
Try livalo 2 mg #30  1 po qhs  2 refills Recheck labs 3 months--- other option is lipid clinic to see if he is candidate for newer meds

## 2014-06-26 ENCOUNTER — Encounter: Payer: Self-pay | Admitting: Family Medicine

## 2014-07-02 ENCOUNTER — Encounter: Payer: Self-pay | Admitting: Cardiovascular Disease

## 2014-08-20 ENCOUNTER — Other Ambulatory Visit (INDEPENDENT_AMBULATORY_CARE_PROVIDER_SITE_OTHER): Payer: Medicare Other

## 2014-08-20 DIAGNOSIS — IMO0002 Reserved for concepts with insufficient information to code with codable children: Secondary | ICD-10-CM

## 2014-08-20 DIAGNOSIS — E1165 Type 2 diabetes mellitus with hyperglycemia: Secondary | ICD-10-CM

## 2014-08-20 DIAGNOSIS — E785 Hyperlipidemia, unspecified: Secondary | ICD-10-CM | POA: Diagnosis not present

## 2014-08-20 LAB — HEPATIC FUNCTION PANEL
ALT: 31 U/L (ref 0–53)
AST: 39 U/L — ABNORMAL HIGH (ref 0–37)
Albumin: 3.9 g/dL (ref 3.5–5.2)
Alkaline Phosphatase: 47 U/L (ref 39–117)
BILIRUBIN DIRECT: 0.2 mg/dL (ref 0.0–0.3)
BILIRUBIN TOTAL: 0.6 mg/dL (ref 0.2–1.2)
TOTAL PROTEIN: 6.9 g/dL (ref 6.0–8.3)

## 2014-08-20 LAB — LIPID PANEL
CHOL/HDL RATIO: 3
CHOLESTEROL: 118 mg/dL (ref 0–200)
HDL: 46.8 mg/dL (ref >39.00)
LDL Cholesterol: 44 mg/dL (ref 0–99)
NonHDL: 71.2
TRIGLYCERIDES: 135 mg/dL (ref 0.0–149.0)
VLDL: 27 mg/dL (ref 0.0–40.0)

## 2014-08-20 LAB — BASIC METABOLIC PANEL
BUN: 13 mg/dL (ref 6–23)
CALCIUM: 9.2 mg/dL (ref 8.4–10.5)
CHLORIDE: 100 meq/L (ref 96–112)
CO2: 28 meq/L (ref 19–32)
CREATININE: 0.99 mg/dL (ref 0.40–1.50)
GFR: 79.42 mL/min (ref >60.00)
Glucose, Bld: 130 mg/dL — ABNORMAL HIGH (ref 70–99)
Potassium: 4.2 mEq/L (ref 3.5–5.1)
SODIUM: 136 meq/L (ref 135–145)

## 2014-08-20 LAB — HEMOGLOBIN A1C: Hgb A1c MFr Bld: 6.5 % (ref 4.6–6.5)

## 2014-08-24 NOTE — Progress Notes (Signed)
Quick Note:  Great! con't meds Recheck 6 months---lipid, hep, bmp, hgba1c Hyperlipidemia, DMII ______

## 2014-08-31 ENCOUNTER — Encounter: Payer: Self-pay | Admitting: Family Medicine

## 2014-09-01 MED ORDER — CANDESARTAN CILEXETIL 16 MG PO TABS: 16.0000 mg | ORAL_TABLET | Freq: Every day | ORAL | Status: DC

## 2014-09-01 MED ORDER — AMLODIPINE BESYLATE 10 MG PO TABS: 10.0000 mg | ORAL_TABLET | Freq: Every day | ORAL | Status: DC

## 2014-09-03 ENCOUNTER — Encounter: Payer: Self-pay | Admitting: Cardiovascular Disease

## 2014-09-03 ENCOUNTER — Ambulatory Visit (INDEPENDENT_AMBULATORY_CARE_PROVIDER_SITE_OTHER): Payer: Medicare Other | Admitting: Cardiovascular Disease

## 2014-09-03 VITALS — BP 128/82 | HR 76 | Ht 70.5 in | Wt 218.6 lb

## 2014-09-03 DIAGNOSIS — I10 Essential (primary) hypertension: Secondary | ICD-10-CM | POA: Diagnosis not present

## 2014-09-03 NOTE — Patient Instructions (Signed)
Your physician wants you to follow-up in: 1 year with Dr Berry. You will receive a reminder letter in the mail two months in advance. If you don't receive a letter, please call our office to schedule the follow-up appointment.  

## 2014-09-03 NOTE — Progress Notes (Signed)
09/03/2014 [name]   [birth year]  [id number]  Primary Physician Garnet Koyanagi, DO Primary Cardiologist: Lorretta Harp MD Renae Gloss   HPI:  Mr. Malerba is a 69 year old moderately overweight married Caucasian male father of 58, confrontative her grandchildren who is retired from [employer]. He was formally a patient of Dr. Lowella Fairy. I last saw him in the office 08/28/13.His history includes hypertension, diabetes and hyperlipidemia. His brother did have a stent in his heart in his 69s. He is never had a heart attack or stroke and denies chest pain or shortness of breath. He had a recent normal 2-D echo and Myoview stress test performed 01/12/11 was nonischemic. Since I saw him in the office a year ago showing currently stable. He denies chest pain or shortness of breath.   Current Outpatient Prescriptions  Medication Sig Dispense Refill  . albuterol (PROAIR HFA) 108 (90 BASE) MCG/ACT inhaler Inhale 2 puffs into the lungs every 6 (six) hours as needed for wheezing. 1 Inhaler 2  . ALPRAZolam (XANAX) 0.25 MG tablet 1 po tid prn 30 tablet 0  . amLODipine (NORVASC) 10 MG tablet Take 1 tablet (10 mg total) by mouth daily. 90 tablet 1  . amoxicillin-clavulanate (AUGMENTIN) 875-125 MG per tablet Take 1 tablet by mouth 2 (two) times daily. 20 tablet 0  . aspirin 81 MG tablet Take 81 mg by mouth daily.    Marland Kitchen atorvastatin (LIPITOR) 10 MG tablet Take 1 tablet (10 mg total) by mouth daily. 90 tablet 1  . Azelastine-Fluticasone (DYMISTA) 137-50 MCG/ACT SUSP 1 spray each nostril bid 1 Bottle 5  . candesartan (ATACAND) 16 MG tablet Take 1 tablet (16 mg total) by mouth at bedtime. 90 tablet 1  . chlorpheniramine-HYDROcodone (TUSSIONEX) 10-8 MG/5ML LQCR Take 5 mLs by mouth every 12 (twelve) hours as needed for cough. 473 mL 0  . Cholecalciferol (VITAMIN D) 2000 UNITS tablet Take 2,000 Units by mouth daily.      Marland Kitchen co-enzyme Q-10 30 MG capsule Take 100 mg by mouth daily.    Marland Kitchen  dicyclomine (BENTYL) 20 MG tablet take 1 tablet by mouth every 6 hours 60 tablet 1  . esomeprazole (NEXIUM) 40 MG capsule Take 20 mg by mouth daily.    Marland Kitchen glimepiride (AMARYL) 4 MG tablet Take 1 tablet (4 mg total) by mouth daily. 90 tablet 1  . Glucosamine Sulfate 1000 MG TABS Take 1,000 tablets by mouth 2 (two) times daily.    Marland Kitchen glucose blood test strip Precision Xtra test strips. Check blood sugar once daily. Dx:250.00 100 each 12  . MAGNESIUM GLYCINATE PLUS PO Take 400 tablets by mouth 2 (two) times daily.    . Melatonin 3 MG TABS Take 3 tablets by mouth as needed.    . meloxicam (MOBIC) 15 MG tablet Take 15 tablets by mouth as needed.  0  . metaxalone (SKELAXIN) 800 MG tablet Take 800 tablets by mouth as needed.  0  . mometasone-formoterol (DULERA) 100-5 MCG/ACT AERO Inhale 2 puffs into the lungs 2 (two) times daily. 1 Inhaler 2  . niacin (SLO-NIACIN) 500 MG tablet Take 500 mg by mouth at bedtime.    Marland Kitchen ofloxacin (FLOXIN) 0.3 % otic solution Place 10 drops into both ears daily. 10 mL 0  . Pitavastatin Calcium 2 MG TABS Take 1 tablet (2 mg total) by mouth daily. 30 tablet 2  . rOPINIRole (REQUIP) 0.5 MG tablet Take 1 tablet (0.5 mg total) by mouth at bedtime. 30 tablet 5  .  SitaGLIPtin-MetFORMIN HCl (JANUMET XR) 3461299454 MG TB24 1 po qd 90 tablet 3  . tiZANidine (ZANAFLEX) 4 MG capsule Take 1 capsule (4 mg total) by mouth 3 (three) times daily as needed for muscle spasms. 30 capsule 1  . [DISCONTINUED] citalopram (CELEXA) 10 MG tablet Take 1 tablet (10 mg total) by mouth daily. 90 tablet 0   No current facility-administered medications for this visit.    Allergies  Allergen Reactions  . Cephalexin     REACTION: pt is not allergic to penicillin    History   Social History  . Marital Status: Married    Spouse Name: N/A  . Number of Children: N/A  . Years of Education: N/A   Occupational History  . Not on file.   Social History Main Topics  . Smoking status: Former Research scientist (life sciences)  .  Smokeless tobacco: Never Used  . Alcohol Use: 0.0 oz/week    0 Cans of beer per week  . Drug Use: No  . Sexual Activity: Not on file   Other Topics Concern  . Not on file   Social History Narrative     Review of Systems: General: negative for chills, fever, night sweats or weight changes.  Cardiovascular: negative for chest pain, dyspnea on exertion, edema, orthopnea, palpitations, paroxysmal nocturnal dyspnea or shortness of breath Dermatological: negative for rash Respiratory: negative for cough or wheezing Urologic: negative for hematuria Abdominal: negative for nausea, vomiting, diarrhea, bright red blood per rectum, melena, or hematemesis Neurologic: negative for visual changes, syncope, or dizziness All other systems reviewed and are otherwise negative except as noted above.    Blood pressure 128/82, pulse 76, height 5' 10.5" (1.791 m), weight 218 lb 9.6 oz (99.156 kg).  General appearance: alert and no distress Neck: no adenopathy, no carotid bruit, no JVD, supple, symmetrical, trachea midline and thyroid not enlarged, symmetric, no tenderness/mass/nodules Lungs: clear to auscultation bilaterally Heart: regular rate and rhythm, S1, S2 normal, no murmur, click, rub or gallop Extremities: extremities normal, atraumatic, no cyanosis or edema  EKG normal sinus rhythm at 76 with nonspecific ST and T-wave changes. Proceeded this EKG  ASSESSMENT AND PLAN:   HYPERLIPIDEMIA history of hyperlipidemia on atorvastatin 10 mg a day with recent lipid profile performed 08/20/14 revealed a total cholesterol 118, LDL 44 and HDL of 46.  ESSENTIAL HYPERTENSION, BENIGN History of hypertension blood pressure measured to 120/82. He is on amlodipine and Atacand . Continue current meds at current dosing      Lorretta Harp MD [facility], [facility] 09/03/2014 12:04 PM

## 2014-09-03 NOTE — Assessment & Plan Note (Addendum)
History of hypertension blood pressure measured to 120/82. He is on amlodipine and Atacand . Continue current meds at current dosing

## 2014-09-03 NOTE — Assessment & Plan Note (Signed)
history of hyperlipidemia on atorvastatin 10 mg a day with recent lipid profile performed 08/20/14 revealed a total cholesterol 118, LDL 44 and HDL of 46.

## 2014-09-21 ENCOUNTER — Encounter: Payer: Self-pay | Admitting: Family Medicine

## 2014-09-22 MED ORDER — DICYCLOMINE HCL 20 MG PO TABS: 20.0000 mg | ORAL_TABLET | Freq: Four times a day (QID) | ORAL | Status: DC

## 2014-11-27 ENCOUNTER — Other Ambulatory Visit: Payer: Self-pay | Admitting: Family Medicine

## 2015-02-04 ENCOUNTER — Other Ambulatory Visit: Payer: Self-pay | Admitting: Family Medicine

## 2015-02-06 ENCOUNTER — Telehealth: Payer: Self-pay

## 2015-02-06 DIAGNOSIS — E119 Type 2 diabetes mellitus without complications: Secondary | ICD-10-CM

## 2015-02-06 DIAGNOSIS — E785 Hyperlipidemia, unspecified: Secondary | ICD-10-CM

## 2015-02-06 NOTE — Telephone Encounter (Signed)
Notes Recorded by Rosalita Chessman, DO on 08/24/2014 at 3:57 PM Great! con't meds Recheck 6 months---lipid, hep, bmp, hgba1c Hyperlipidemia, DMII   The orders are in.      [place]

## 2015-02-06 NOTE — Telephone Encounter (Signed)
-----   Message from Elveria Royals sent at 02/06/2015 11:59 AM EST ----- The patient is coming in on Tuesday 02-10-2015 for his 6 month labs that were notes in the lab results.  Also if he has not had a flu shot he would like to get one at that time   Thanks Marj

## 2015-02-10 ENCOUNTER — Other Ambulatory Visit (INDEPENDENT_AMBULATORY_CARE_PROVIDER_SITE_OTHER): Payer: Medicare Other

## 2015-02-10 ENCOUNTER — Ambulatory Visit (INDEPENDENT_AMBULATORY_CARE_PROVIDER_SITE_OTHER): Payer: Medicare Other

## 2015-02-10 DIAGNOSIS — E785 Hyperlipidemia, unspecified: Secondary | ICD-10-CM | POA: Diagnosis not present

## 2015-02-10 DIAGNOSIS — E119 Type 2 diabetes mellitus without complications: Secondary | ICD-10-CM

## 2015-02-10 DIAGNOSIS — Z23 Encounter for immunization: Secondary | ICD-10-CM

## 2015-02-10 LAB — COMPREHENSIVE METABOLIC PANEL
ALK PHOS: 54 U/L (ref 39–117)
ALT: 40 U/L (ref 0–53)
AST: 58 U/L — ABNORMAL HIGH (ref 0–37)
Albumin: 4.1 g/dL (ref 3.5–5.2)
BILIRUBIN TOTAL: 0.7 mg/dL (ref 0.2–1.2)
BUN: 14 mg/dL (ref 6–23)
CO2: 29 mEq/L (ref 19–32)
Calcium: 9.7 mg/dL (ref 8.4–10.5)
Chloride: 99 mEq/L (ref 96–112)
Creatinine, Ser: 1.02 mg/dL (ref 0.40–1.50)
GFR: 76.62 mL/min (ref >60.00)
GLUCOSE: 179 mg/dL — AB (ref 70–99)
Potassium: 4.2 mEq/L (ref 3.5–5.1)
Sodium: 136 mEq/L (ref 135–145)
TOTAL PROTEIN: 7.2 g/dL (ref 6.0–8.3)

## 2015-02-10 LAB — LIPID PANEL
Cholesterol: 117 mg/dL (ref 0–200)
HDL: 39.1 mg/dL (ref >39.00)
LDL Cholesterol: 57 mg/dL (ref 0–99)
NONHDL: 78.27
Total CHOL/HDL Ratio: 3
Triglycerides: 106 mg/dL (ref 0.0–149.0)
VLDL: 21.2 mg/dL (ref 0.0–40.0)

## 2015-02-10 LAB — HEMOGLOBIN A1C: Hgb A1c MFr Bld: 7.2 % — ABNORMAL HIGH (ref 4.6–6.5)

## 2015-02-14 ENCOUNTER — Encounter: Payer: Self-pay | Admitting: Family Medicine

## 2015-02-14 ENCOUNTER — Other Ambulatory Visit: Payer: Self-pay | Admitting: Family Medicine

## 2015-02-14 DIAGNOSIS — E1165 Type 2 diabetes mellitus with hyperglycemia: Secondary | ICD-10-CM

## 2015-02-14 MED ORDER — SITAGLIP PHOS-METFORMIN HCL ER 50-1000 MG PO TB24: ORAL_TABLET | ORAL | Status: DC

## 2015-02-16 MED ORDER — CANDESARTAN CILEXETIL 16 MG PO TABS: 16.0000 mg | ORAL_TABLET | Freq: Every day | ORAL | Status: DC

## 2015-02-21 ENCOUNTER — Encounter: Payer: Self-pay | Admitting: Family Medicine

## 2015-02-23 DIAGNOSIS — Z0000 Encounter for general adult medical examination without abnormal findings: Secondary | ICD-10-CM | POA: Diagnosis not present

## 2015-02-23 DIAGNOSIS — N411 Chronic prostatitis: Secondary | ICD-10-CM | POA: Diagnosis not present

## 2015-03-04 ENCOUNTER — Other Ambulatory Visit: Payer: Self-pay | Admitting: Family Medicine

## 2015-03-04 DIAGNOSIS — F411 Generalized anxiety disorder: Secondary | ICD-10-CM

## 2015-03-04 MED ORDER — ALPRAZOLAM 0.25 MG PO TABS: ORAL_TABLET | ORAL | Status: DC

## 2015-03-04 NOTE — Telephone Encounter (Signed)
Last seen 05/15/14 and filled 05/09/14 #30   Please advise    KP

## 2015-03-05 MED ORDER — ALPRAZOLAM 0.25 MG PO TABS
ORAL_TABLET | ORAL | Status: DC
Start: 2015-03-05 — End: 2015-09-02

## 2015-03-05 NOTE — Addendum Note (Signed)
Addended by: Ewing Schlein on: 03/05/2015 08:47 AM   Modules accepted: Orders

## 2015-03-12 ENCOUNTER — Encounter: Payer: Self-pay | Admitting: Family Medicine

## 2015-03-12 MED ORDER — GLUCOSE BLOOD VI STRP: ORAL_STRIP | Status: DC

## 2015-03-16 DIAGNOSIS — Z0000 Encounter for general adult medical examination without abnormal findings: Secondary | ICD-10-CM | POA: Diagnosis not present

## 2015-03-16 DIAGNOSIS — N411 Chronic prostatitis: Secondary | ICD-10-CM | POA: Diagnosis not present

## 2015-04-06 ENCOUNTER — Encounter: Payer: Self-pay | Admitting: Family Medicine

## 2015-04-06 DIAGNOSIS — S8391XA Sprain of unspecified site of right knee, initial encounter: Secondary | ICD-10-CM | POA: Diagnosis not present

## 2015-04-08 ENCOUNTER — Ambulatory Visit (INDEPENDENT_AMBULATORY_CARE_PROVIDER_SITE_OTHER): Payer: Medicare Other | Admitting: Medical

## 2015-04-08 ENCOUNTER — Encounter: Payer: Self-pay | Admitting: Medical

## 2015-04-08 VITALS — BP 124/78 | HR 66 | Temp 97.7°F | Ht 70.5 in | Wt 214.8 lb

## 2015-04-08 DIAGNOSIS — R252 Cramp and spasm: Secondary | ICD-10-CM

## 2015-04-08 DIAGNOSIS — Z23 Encounter for immunization: Secondary | ICD-10-CM

## 2015-04-08 DIAGNOSIS — E119 Type 2 diabetes mellitus without complications: Secondary | ICD-10-CM | POA: Diagnosis not present

## 2015-04-08 DIAGNOSIS — M533 Sacrococcygeal disorders, not elsewhere classified: Secondary | ICD-10-CM | POA: Diagnosis not present

## 2015-04-08 LAB — COMPREHENSIVE METABOLIC PANEL
ALK PHOS: 42 U/L (ref 39–117)
ALT: 25 U/L (ref 0–53)
AST: 26 U/L (ref 0–37)
Albumin: 4.5 g/dL (ref 3.5–5.2)
BUN: 21 mg/dL (ref 6–23)
CHLORIDE: 96 meq/L (ref 96–112)
CO2: 27 meq/L (ref 19–32)
Calcium: 9.7 mg/dL (ref 8.4–10.5)
Creatinine, Ser: 0.93 mg/dL (ref 0.40–1.50)
GFR: 85.21 mL/min (ref >60.00)
GLUCOSE: 171 mg/dL — AB (ref 70–99)
POTASSIUM: 4.1 meq/L (ref 3.5–5.1)
SODIUM: 131 meq/L — AB (ref 135–145)
TOTAL PROTEIN: 7.4 g/dL (ref 6.0–8.3)
Total Bilirubin: 0.6 mg/dL (ref 0.2–1.2)

## 2015-04-08 LAB — POCT GLUCOSE (DEVICE FOR HOME USE)
Glucose Fasting, POC: 182 mg/dL — AB (ref 70–99)
POC GLUCOSE: 182 mg/dL — AB (ref 70–99)

## 2015-04-08 LAB — MAGNESIUM: Magnesium: 1.8 mg/dL (ref 1.5–2.5)

## 2015-04-08 MED ORDER — DICYCLOMINE HCL 20 MG PO TABS: 20.0000 mg | ORAL_TABLET | Freq: Four times a day (QID) | ORAL | Status: DC

## 2015-04-08 NOTE — Addendum Note (Signed)
Addended by: Tasia Catchings on: 04/08/2015 02:44 PM   Modules accepted: Orders

## 2015-04-08 NOTE — Patient Instructions (Addendum)
For your diabetes continue you oral medication. But since your sugars are going over 200 recently(with steroid use) will give you a novolog flex pen. Use sliding scale as Bruce Romero directed you. After steroids finished need for flex pen may pass as sugars come down.(sliding scale copy given to pt)  For your muscle cramps recently will get cmp, and mg level.   For coccyx pain would you will continue the tapered prednisone.  I did refill your  bentyl for possible IBS as you described. Continue regular Gi follow ups/colonsocpy when due. If worse Gi symptoms notify us.   Follow up as regularly scheduled with pcp or as needed.

## 2015-04-08 NOTE — Progress Notes (Signed)
Subjective:    Patient ID: Bruce Romero, male    DOB: 1945/10/27, 70 y.o.   MRN: [id number]  HPI  Pt in stating that he feels fine.   Pt gives me update. Pt saw urologist NP recently. Pt was given Mobic and Rapaflo. They thought  no Prostate CA. But did not get better with antibiotic. Rapoflo was topped. He went there initially for lower back pain.  So pt went to ortho who thought he had bursae inflamed. Pt was given taper prednisone. The last day will be on Sunday. Pt  Blood sugar was  233 . 2 hours after dinner. 181 fasting today.   Pt is on Janumet. 50/100. Taking 2 tablets a day.  Other night one medial rt  thigh muscle cramp but none since.   Pt request refill of bentyl. Some occasional type symptoms. Pt is up to date on his colonoscopy.     Review of Systems  Constitutional: Negative for fever, chills and fatigue.  Respiratory: Negative for cough, choking, shortness of breath and wheezing.   Cardiovascular: Negative for chest pain and palpitations.  Gastrointestinal: Negative for abdominal pain.  Musculoskeletal: Negative for back pain.  Skin: Negative for rash.  Neurological: Negative for dizziness and numbness.  Hematological: Negative for adenopathy. Does not bruise/bleed easily.  Psychiatric/Behavioral: Negative for behavioral problems and confusion.    Past Medical History  Diagnosis Date  . 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 Diabetes mellitus   . Hyperlipidemia   . Hypertension     Social History   Social History  . Marital Status: Married    Spouse Name: N/A  . Number of Children: N/A  . Years of Education: N/A   Occupational History  . Not on file.   Social History Main Topics  . Smoking status: Former Research scientist (life sciences)  . Smokeless tobacco: Never Used  . Alcohol Use: 0.0 oz/week    0 Cans of beer per week  . Drug Use: No  . Sexual Activity: Not on file   Other Topics Concern  . Not on file   Social History Narrative    Past  Surgical History  Procedure Laterality Date  . Cholecystectomy      Family History  Problem Relation Age of Onset  . Stroke Mother   . Pulmonary embolism Father     Allergies  Allergen Reactions  . Cephalexin     REACTION: pt is not allergic to penicillin    Current Outpatient Prescriptions on File Prior to Visit  Medication Sig Dispense Refill  . albuterol (PROAIR HFA) 108 (90 BASE) MCG/ACT inhaler Inhale 2 puffs into the lungs every 6 (six) hours as needed for wheezing. 1 Inhaler 2  . ALPRAZolam (XANAX) 0.25 MG tablet 1 po tid prn 30 tablet 0  . amLODipine (NORVASC) 10 MG tablet take 1 tablet by mouth once daily 90 tablet 1  . aspirin 81 MG tablet Take 81 mg by mouth daily.    Marland Kitchen atorvastatin (LIPITOR) 10 MG tablet take 1 tablet by mouth once daily 90 tablet 1  . Azelastine-Fluticasone (DYMISTA) 137-50 MCG/ACT SUSP 1 spray each nostril bid 1 Bottle 5  . candesartan (ATACAND) 16 MG tablet Take 1 tablet (16 mg total) by mouth at bedtime. 90 tablet 1  . chlorpheniramine-HYDROcodone (TUSSIONEX) 10-8 MG/5ML LQCR Take 5 mLs by mouth every 12 (twelve) hours as needed for cough. 473 mL 0  . Cholecalciferol (VITAMIN D) 2000 UNITS tablet Take 2,000  Units by mouth daily.      Marland Kitchen co-enzyme Q-10 30 MG capsule Take 100 mg by mouth daily.    Marland Kitchen dicyclomine (BENTYL) 20 MG tablet Take 1 tablet (20 mg total) by mouth every 6 (six) hours. 60 tablet 1  . esomeprazole (NEXIUM) 40 MG capsule Take 20 mg by mouth daily.    Marland Kitchen glimepiride (AMARYL) 4 MG tablet take 1 tablet once daily 90 tablet 1  . Glucosamine Sulfate 1000 MG TABS Take 1,000 tablets by mouth 2 (two) times daily.    Marland Kitchen glucose blood test strip Precision Xtra test strips. Check blood sugar once daily. Dx:E11.9 100 each 12  . MAGNESIUM GLYCINATE PLUS PO Take 400 tablets by mouth 2 (two) times daily.    . Melatonin 3 MG TABS Take 3 tablets by mouth as needed.    . metaxalone (SKELAXIN) 800 MG tablet Take 800 tablets by mouth as needed.  0  .  mometasone-formoterol (DULERA) 100-5 MCG/ACT AERO Inhale 2 puffs into the lungs 2 (two) times daily. 1 Inhaler 2  . niacin (SLO-NIACIN) 500 MG tablet Take 500 mg by mouth at bedtime.    Marland Kitchen ofloxacin (FLOXIN) 0.3 % otic solution Place 10 drops into both ears daily. 10 mL 0  . Pitavastatin Calcium 2 MG TABS Take 1 tablet (2 mg total) by mouth daily. 30 tablet 2  . rOPINIRole (REQUIP) 0.5 MG tablet Take 1 tablet (0.5 mg total) by mouth at bedtime. 30 tablet 5  . SitaGLIPtin-MetFORMIN HCl (JANUMET XR) [id number] MG TB24 1 po qd 90 tablet 3  . tiZANidine (ZANAFLEX) 4 MG capsule Take 1 capsule (4 mg total) by mouth 3 (three) times daily as needed for muscle spasms. 30 capsule 1  . [DISCONTINUED] citalopram (CELEXA) 10 MG tablet Take 1 tablet (10 mg total) by mouth daily. 90 tablet 0   No current facility-administered medications on file prior to visit.    BP 124/78 mmHg  Pulse 66  Temp(Src) 97.7 F (36.5 C) (Oral)  Ht 5' 10.5" (1.791 m)  Wt 214 lb 12.8 oz (97.433 kg)  BMI 30.37 kg/m2  SpO2 96%       Objective:   Physical Exam   General Mental Status- Alert. General Appearance- Not in acute distress.   Skin General: Color- Normal Color. Moisture- Normal Moisture.  Neck Carotid Arteries- Normal color. Moisture- Normal Moisture. No carotid bruits. No JVD.  Chest and Lung Exam Auscultation: Breath Sounds:-Normal.  Cardiovascular Auscultation:Rythm- Regular. Murmurs & Other Heart Sounds:Auscultation of the heart reveals- No Murmurs.  Abdomen Inspection:-Inspeection Normal. Palpation/Percussion:Note:No mass. Palpation and Percussion of the abdomen reveal- Non Tender, Non Distended + BS, no rebound or guarding.    Neurologic Cranial Nerve exam:- CN III-XII intact(No nystagmus), symmetric smile. Strength:- 5/5 equal and symmetric strength both upper and lower extremities.  Lower ext- see quality metrics.  Back- lower coccyx faint tender to palpation.     Assessment & Plan:    For your diabetes continue you oral medication. But since your sugars are going over 200 recently(with steroid use) will give you a novolog flex pen. Use sliding scale as Kim directed you. After steroids finished need for flex pen may pass as sugars come down.  For your muscle cramps recently will get cmp, and mg level.   For coccyx pain would you will continue the tapered prednisone.  I did refill your  bentyl for possible IBS as you described. Continue regular Gi follow ups/colonsocpy when due. If worse Gi symptoms notify us.  Follow up as regularly scheduled with pcp or as needed.

## 2015-04-08 NOTE — Progress Notes (Signed)
Pre visit review using our clinic review tool, if applicable. No additional management support is needed unless otherwise documented below in the visit note. 

## 2015-04-08 NOTE — Addendum Note (Signed)
Addended by: Tasia Catchings on: 04/08/2015 11:55 AM   Modules accepted: Orders

## 2015-04-16 DIAGNOSIS — L812 Freckles: Secondary | ICD-10-CM | POA: Diagnosis not present

## 2015-04-16 DIAGNOSIS — D225 Melanocytic nevi of trunk: Secondary | ICD-10-CM | POA: Diagnosis not present

## 2015-04-16 DIAGNOSIS — L821 Other seborrheic keratosis: Secondary | ICD-10-CM | POA: Diagnosis not present

## 2015-04-16 DIAGNOSIS — L570 Actinic keratosis: Secondary | ICD-10-CM | POA: Diagnosis not present

## 2015-04-21 ENCOUNTER — Encounter: Payer: Self-pay | Admitting: Family Medicine

## 2015-04-21 DIAGNOSIS — E1165 Type 2 diabetes mellitus with hyperglycemia: Secondary | ICD-10-CM

## 2015-04-22 NOTE — Telephone Encounter (Signed)
It was increased to 2 a day not 1 bid.  It is extended release.  Meant to be taken 1x a day.

## 2015-04-23 MED ORDER — SITAGLIP PHOS-METFORMIN HCL ER 50-1000 MG PO TB24: ORAL_TABLET | ORAL | Status: DC

## 2015-04-28 NOTE — Telephone Encounter (Signed)
Change to 100/ 1000 1 po qd #30  #90 0 refills  Recheck labs 3 months

## 2015-04-28 NOTE — Addendum Note (Signed)
Addended by: Ewing Schlein on: 04/28/2015 04:28 PM   Modules accepted: Orders

## 2015-04-28 NOTE — Telephone Encounter (Signed)
janumet xr 50 / 1000 is supposed to be 2 po qd ----#60  2 refills--- - I may not be understanding what he is asking

## 2015-04-28 NOTE — Telephone Encounter (Signed)
He is saying his blood sugars are not stable on this dose. He also sent his blood sugars. Please advise   KP

## 2015-04-29 MED ORDER — SITAGLIP PHOS-METFORMIN HCL ER 100-1000 MG PO TB24: 1.0000 | ORAL_TABLET | Freq: Every day | ORAL | Status: DC

## 2015-04-29 NOTE — Addendum Note (Signed)
Addended by: Ewing Schlein on: 04/29/2015 08:31 AM   Modules accepted: Orders

## 2015-04-30 ENCOUNTER — Encounter: Payer: Self-pay | Admitting: Family Medicine

## 2015-04-30 MED ORDER — GLUCOSE BLOOD VI STRP: ORAL_STRIP | Status: DC

## 2015-04-30 NOTE — Telephone Encounter (Signed)
Ok to refill 3 mon supply and can put off labs 3 months

## 2015-05-02 DIAGNOSIS — M25562 Pain in left knee: Secondary | ICD-10-CM | POA: Diagnosis not present

## 2015-06-16 ENCOUNTER — Telehealth: Payer: Self-pay | Admitting: Family Medicine

## 2015-06-16 NOTE — Telephone Encounter (Signed)
Pt declined AWV with RN

## 2015-06-30 ENCOUNTER — Encounter: Payer: Self-pay | Admitting: Family Medicine

## 2015-07-01 ENCOUNTER — Other Ambulatory Visit: Payer: Self-pay | Admitting: Emergency Medicine

## 2015-07-01 MED ORDER — GLIMEPIRIDE 4 MG PO TABS: 4.0000 mg | ORAL_TABLET | Freq: Every day | ORAL | Status: DC

## 2015-07-01 MED ORDER — ATORVASTATIN CALCIUM 10 MG PO TABS: 10.0000 mg | ORAL_TABLET | Freq: Every day | ORAL | Status: DC

## 2015-07-01 NOTE — Telephone Encounter (Signed)
Rx sent 

## 2015-07-02 ENCOUNTER — Encounter: Payer: Self-pay | Admitting: Family Medicine

## 2015-07-02 ENCOUNTER — Ambulatory Visit (INDEPENDENT_AMBULATORY_CARE_PROVIDER_SITE_OTHER): Payer: Medicare Other | Admitting: Family Medicine

## 2015-07-02 VITALS — BP 108/70 | HR 58 | Temp 97.9°F | Ht 70.5 in | Wt 210.2 lb

## 2015-07-02 DIAGNOSIS — E785 Hyperlipidemia, unspecified: Secondary | ICD-10-CM | POA: Diagnosis not present

## 2015-07-02 DIAGNOSIS — I10 Essential (primary) hypertension: Secondary | ICD-10-CM | POA: Diagnosis not present

## 2015-07-02 DIAGNOSIS — E1151 Type 2 diabetes mellitus with diabetic peripheral angiopathy without gangrene: Secondary | ICD-10-CM | POA: Diagnosis not present

## 2015-07-02 NOTE — Patient Instructions (Signed)

## 2015-07-02 NOTE — Progress Notes (Signed)
Pre visit review using our clinic review tool, if applicable. No additional management support is needed unless otherwise documented below in the visit note. 

## 2015-07-03 ENCOUNTER — Other Ambulatory Visit (INDEPENDENT_AMBULATORY_CARE_PROVIDER_SITE_OTHER): Payer: Medicare Other

## 2015-07-03 DIAGNOSIS — I10 Essential (primary) hypertension: Secondary | ICD-10-CM

## 2015-07-03 DIAGNOSIS — E1151 Type 2 diabetes mellitus with diabetic peripheral angiopathy without gangrene: Secondary | ICD-10-CM

## 2015-07-03 DIAGNOSIS — E785 Hyperlipidemia, unspecified: Secondary | ICD-10-CM

## 2015-07-03 LAB — LIPID PANEL
CHOL/HDL RATIO: 3
Cholesterol: 115 mg/dL (ref 0–200)
HDL: 41.2 mg/dL (ref >39.00)
LDL CALC: 56 mg/dL (ref 0–99)
NONHDL: 74.04
TRIGLYCERIDES: 92 mg/dL (ref 0.0–149.0)
VLDL: 18.4 mg/dL (ref 0.0–40.0)

## 2015-07-03 LAB — COMPREHENSIVE METABOLIC PANEL
ALBUMIN: 4 g/dL (ref 3.5–5.2)
ALT: 21 U/L (ref 0–53)
AST: 25 U/L (ref 0–37)
Alkaline Phosphatase: 43 U/L (ref 39–117)
BUN: 21 mg/dL (ref 6–23)
CHLORIDE: 104 meq/L (ref 96–112)
CO2: 28 meq/L (ref 19–32)
CREATININE: 1.03 mg/dL (ref 0.40–1.50)
Calcium: 9.2 mg/dL (ref 8.4–10.5)
GFR: 75.68 mL/min (ref >60.00)
Glucose, Bld: 145 mg/dL — ABNORMAL HIGH (ref 70–99)
POTASSIUM: 4.2 meq/L (ref 3.5–5.1)
SODIUM: 137 meq/L (ref 135–145)
Total Bilirubin: 0.6 mg/dL (ref 0.2–1.2)
Total Protein: 6.6 g/dL (ref 6.0–8.3)

## 2015-07-03 LAB — HEMOGLOBIN A1C: HEMOGLOBIN A1C: 6.7 % — AB (ref 4.6–6.5)

## 2015-07-06 ENCOUNTER — Encounter: Payer: Self-pay | Admitting: Family Medicine

## 2015-07-07 MED ORDER — SITAGLIP PHOS-METFORMIN HCL ER 100-1000 MG PO TB24: 1.0000 | ORAL_TABLET | Freq: Every day | ORAL | Status: DC

## 2015-07-07 NOTE — Telephone Encounter (Signed)
Rx sent 

## 2015-07-07 NOTE — Addendum Note (Signed)
Addended byDamita Dunnings D on: 07/07/2015 08:49 AM   Modules accepted: Orders

## 2015-07-12 NOTE — Progress Notes (Signed)
Patient ID: Bruce Romero, male    DOB: 1945/02/27  Age: 70 y.o. MRN: [id number]    Subjective:  Subjective HPI DELMON ANDRADA presents for f/u dm, cholesterol and htn No complaints.   HPI HYPERTENSION  Blood pressure range-good  Chest pain- no      Dyspnea- no Lightheadedness- no   Edema- no Other side effects - no   Medication compliance: good Low salt diet- yes  DIABETES  Blood Sugar ranges--?  Polyuria- no New Visual problems- no Hypoglycemic symptoms- no Other side effects-no Medication compliance - good Last eye exam- due Foot exam- today  HYPERLIPIDEMIA  Medication compliance- good RUQ pain- no  Muscle aches- no Other side effects-no  Review of Systems  Constitutional: Negative for diaphoresis, appetite change, fatigue and unexpected weight change.  Eyes: Negative for pain, redness and visual disturbance.  Respiratory: Negative for cough, chest tightness, shortness of breath and wheezing.   Cardiovascular: Negative for chest pain, palpitations and leg swelling.  Endocrine: Negative for cold intolerance, heat intolerance, polydipsia, polyphagia and polyuria.  Genitourinary: Negative for dysuria, frequency and difficulty urinating.  Neurological: Negative for dizziness, light-headedness, numbness and headaches.    History Past Medical History  Diagnosis Date  . 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 Diabetes mellitus   . Hyperlipidemia   . Hypertension     He has past surgical history that includes Cholecystectomy.   His family history includes Pulmonary embolism in his father; Stroke in his mother.He reports that he has quit smoking. He has never used smokeless tobacco. He reports that he drinks alcohol. He reports that he does not use illicit drugs.  Current Outpatient Prescriptions on File Prior to Visit  Medication Sig Dispense Refill  . albuterol (PROAIR HFA) 108 (90 BASE) MCG/ACT inhaler Inhale 2 puffs into the lungs every 6  (six) hours as needed for wheezing. 1 Inhaler 2  . ALPRAZolam (XANAX) 0.25 MG tablet 1 po tid prn 30 tablet 0  . amLODipine (NORVASC) 10 MG tablet take 1 tablet by mouth once daily 90 tablet 1  . aspirin 81 MG tablet Take 81 mg by mouth daily.    Marland Kitchen atorvastatin (LIPITOR) 10 MG tablet Take 1 tablet (10 mg total) by mouth daily. 90 tablet 1  . Azelastine-Fluticasone (DYMISTA) 137-50 MCG/ACT SUSP 1 spray each nostril bid 1 Bottle 5  . Blood Glucose Monitoring Suppl (ONE TOUCH ULTRA 2) w/Device KIT See admin instructions.  0  . candesartan (ATACAND) 16 MG tablet Take 1 tablet (16 mg total) by mouth at bedtime. 90 tablet 1  . Cholecalciferol (VITAMIN D) 2000 UNITS tablet Take 2,000 Units by mouth daily.      Marland Kitchen co-enzyme Q-10 30 MG capsule Take 100 mg by mouth daily.    Marland Kitchen dicyclomine (BENTYL) 20 MG tablet Take 1 tablet (20 mg total) by mouth every 6 (six) hours. 60 tablet 1  . esomeprazole (NEXIUM) 40 MG capsule Take 20 mg by mouth daily.    Marland Kitchen glimepiride (AMARYL) 4 MG tablet Take 1 tablet (4 mg total) by mouth daily. 30 tablet 0  . Glucosamine Sulfate 1000 MG TABS Take 1,000 tablets by mouth 2 (two) times daily.    Marland Kitchen glucose blood (ONE TOUCH ULTRA TEST) test strip Check blood sugar daily. Dx: E11.9 50 each 12  . MAGNESIUM GLYCINATE PLUS PO Take 400 tablets by mouth 2 (two) times daily.    . Melatonin 3 MG TABS Take 3 tablets by mouth  as needed.    . metaxalone (SKELAXIN) 800 MG tablet Take 800 tablets by mouth as needed.  0  . mometasone-formoterol (DULERA) 100-5 MCG/ACT AERO Inhale 2 puffs into the lungs 2 (two) times daily. 1 Inhaler 2  . niacin (SLO-NIACIN) 500 MG tablet Take 500 mg by mouth at bedtime.    . Pitavastatin Calcium 2 MG TABS Take 1 tablet (2 mg total) by mouth daily. 30 tablet 2  . rOPINIRole (REQUIP) 0.5 MG tablet Take 1 tablet (0.5 mg total) by mouth at bedtime. 30 tablet 5  . tiZANidine (ZANAFLEX) 4 MG capsule Take 1 capsule (4 mg total) by mouth 3 (three) times daily as needed  for muscle spasms. 30 capsule 1  . [DISCONTINUED] citalopram (CELEXA) 10 MG tablet Take 1 tablet (10 mg total) by mouth daily. 90 tablet 0   No current facility-administered medications on file prior to visit.     Objective:  Objective Physical Exam  Constitutional: He is oriented to person, place, and time. Vital signs are normal. He appears well-developed and well-nourished. He is sleeping.  HENT:  Head: Normocephalic and atraumatic.  Mouth/Throat: Oropharynx is clear and moist.  Eyes: EOM are normal. Pupils are equal, round, and reactive to light.  Neck: Normal range of motion. Neck supple. No thyromegaly present.  Cardiovascular: Normal rate and regular rhythm.   No murmur heard. Pulmonary/Chest: Effort normal and breath sounds normal. No respiratory distress. He has no wheezes. He has no rales. He exhibits no tenderness.  Musculoskeletal: He exhibits no edema or tenderness.  Neurological: He is alert and oriented to person, place, and time.  Skin: Skin is warm and dry.  Psychiatric: He has a normal mood and affect. His behavior is normal. Judgment and thought content normal.  Nursing note and vitals reviewed. Sensory exam of the foot is normal, tested with the monofilament. Good pulses, no lesions or ulcers, good peripheral pulses.  BP 108/70 mmHg  Pulse 58  Temp(Src) 97.9 F (36.6 C) (Oral)  Ht 5' 10.5" (1.791 m)  Wt 210 lb 3.2 oz (95.346 kg)  BMI 29.72 kg/m2  SpO2 98% Wt Readings from Last 3 Encounters:  07/02/15 210 lb 3.2 oz (95.346 kg)  04/08/15 214 lb 12.8 oz (97.433 kg)  09/03/14 218 lb 9.6 oz (99.156 kg)     Lab Results  Component Value Date   WBC 6.8 08/31/2011   HGB 15.0 08/31/2011   HCT 44.3 08/31/2011   PLT 158.0 08/31/2011   GLUCOSE 145* 07/03/2015   CHOL 115 07/03/2015   TRIG 92.0 07/03/2015   HDL 41.20 07/03/2015   LDLCALC 56 07/03/2015   ALT 21 07/03/2015   AST 25 07/03/2015   NA 137 07/03/2015   K 4.2 07/03/2015   CL 104 07/03/2015    CREATININE 1.03 07/03/2015   BUN 21 07/03/2015   CO2 28 07/03/2015   TSH 0.979 12/30/2010   PSA 0.71 09/16/2013   HGBA1C 6.7* 07/03/2015   MICROALBUR 0.1 04/22/2014    No results found.   Assessment & Plan:  Plan I have discontinued Mr. Ricciuti ofloxacin, chlorpheniramine-HYDROcodone, and predniSONE. I am also having him maintain his Vitamin D, tiZANidine, esomeprazole, niacin, rOPINIRole, Azelastine-Fluticasone, mometasone-formoterol, albuterol, Pitavastatin Calcium, metaxalone, co-enzyme Q-10, Melatonin, Glucosamine Sulfate, MAGNESIUM GLYCINATE PLUS PO, aspirin, candesartan, amLODipine, ALPRAZolam, ONE TOUCH ULTRA 2, dicyclomine, glucose blood, glimepiride, and atorvastatin.  No orders of the defined types were placed in this encounter.    Problem List Items Addressed This Visit    ESSENTIAL HYPERTENSION, BENIGN  Stable con't amlodapine      Hyperlipidemia LDL goal <100    Current Outpatient Prescriptions on File Prior to Visit  Medication Sig Dispense Refill  . albuterol (PROAIR HFA) 108 (90 BASE) MCG/ACT inhaler Inhale 2 puffs into the lungs every 6 (six) hours as needed for wheezing. 1 Inhaler 2  . ALPRAZolam (XANAX) 0.25 MG tablet 1 po tid prn 30 tablet 0  . amLODipine (NORVASC) 10 MG tablet take 1 tablet by mouth once daily 90 tablet 1  . aspirin 81 MG tablet Take 81 mg by mouth daily.    Marland Kitchen atorvastatin (LIPITOR) 10 MG tablet Take 1 tablet (10 mg total) by mouth daily. 90 tablet 1  . Azelastine-Fluticasone (DYMISTA) 137-50 MCG/ACT SUSP 1 spray each nostril bid 1 Bottle 5  . Blood Glucose Monitoring Suppl (ONE TOUCH ULTRA 2) w/Device KIT See admin instructions.  0  . candesartan (ATACAND) 16 MG tablet Take 1 tablet (16 mg total) by mouth at bedtime. 90 tablet 1  . Cholecalciferol (VITAMIN D) 2000 UNITS tablet Take 2,000 Units by mouth daily.      Marland Kitchen co-enzyme Q-10 30 MG capsule Take 100 mg by mouth daily.    Marland Kitchen dicyclomine (BENTYL) 20 MG tablet Take 1 tablet (20 mg  total) by mouth every 6 (six) hours. 60 tablet 1  . esomeprazole (NEXIUM) 40 MG capsule Take 20 mg by mouth daily.    Marland Kitchen glimepiride (AMARYL) 4 MG tablet Take 1 tablet (4 mg total) by mouth daily. 30 tablet 0  . Glucosamine Sulfate 1000 MG TABS Take 1,000 tablets by mouth 2 (two) times daily.    Marland Kitchen glucose blood (ONE TOUCH ULTRA TEST) test strip Check blood sugar daily. Dx: E11.9 50 each 12  . MAGNESIUM GLYCINATE PLUS PO Take 400 tablets by mouth 2 (two) times daily.    . Melatonin 3 MG TABS Take 3 tablets by mouth as needed.    . metaxalone (SKELAXIN) 800 MG tablet Take 800 tablets by mouth as needed.  0  . mometasone-formoterol (DULERA) 100-5 MCG/ACT AERO Inhale 2 puffs into the lungs 2 (two) times daily. 1 Inhaler 2  . niacin (SLO-NIACIN) 500 MG tablet Take 500 mg by mouth at bedtime.    . Pitavastatin Calcium 2 MG TABS Take 1 tablet (2 mg total) by mouth daily. 30 tablet 2  . rOPINIRole (REQUIP) 0.5 MG tablet Take 1 tablet (0.5 mg total) by mouth at bedtime. 30 tablet 5  . tiZANidine (ZANAFLEX) 4 MG capsule Take 1 capsule (4 mg total) by mouth 3 (three) times daily as needed for muscle spasms. 30 capsule 1  . [DISCONTINUED] citalopram (CELEXA) 10 MG tablet Take 1 tablet (10 mg total) by mouth daily. 90 tablet 0   No current facility-administered medications on file prior to visit.          Other Visit Diagnoses    Hyperlipidemia    -  Primary    Relevant Orders    Lipid panel (Completed)    Comprehensive metabolic panel (Completed)    Type 2 diabetes, controlled, with peripheral circulatory disorder (HCC)        Relevant Orders    Lipid panel (Completed)    Hemoglobin A1c (Completed)    Comprehensive metabolic panel (Completed)    Essential hypertension        Relevant Orders    Lipid panel (Completed)    Hemoglobin A1c (Completed)    Comprehensive metabolic panel (Completed)       Follow-up: Return  in about 6 months (around 01/02/2016), or if symptoms worsen or fail to  improve, for hypertension, hyperlipidemia, diabetes II.  Ann Held, DO

## 2015-07-12 NOTE — Assessment & Plan Note (Signed)
Current Outpatient Prescriptions on File Prior to Visit  Medication Sig Dispense Refill  . albuterol (PROAIR HFA) 108 (90 BASE) MCG/ACT inhaler Inhale 2 puffs into the lungs every 6 (six) hours as needed for wheezing. 1 Inhaler 2  . ALPRAZolam (XANAX) 0.25 MG tablet 1 po tid prn 30 tablet 0  . amLODipine (NORVASC) 10 MG tablet take 1 tablet by mouth once daily 90 tablet 1  . aspirin 81 MG tablet Take 81 mg by mouth daily.    Marland Kitchen atorvastatin (LIPITOR) 10 MG tablet Take 1 tablet (10 mg total) by mouth daily. 90 tablet 1  . Azelastine-Fluticasone (DYMISTA) 137-50 MCG/ACT SUSP 1 spray each nostril bid 1 Bottle 5  . Blood Glucose Monitoring Suppl (ONE TOUCH ULTRA 2) w/Device KIT See admin instructions.  0  . candesartan (ATACAND) 16 MG tablet Take 1 tablet (16 mg total) by mouth at bedtime. 90 tablet 1  . Cholecalciferol (VITAMIN D) 2000 UNITS tablet Take 2,000 Units by mouth daily.      Marland Kitchen co-enzyme Q-10 30 MG capsule Take 100 mg by mouth daily.    Marland Kitchen dicyclomine (BENTYL) 20 MG tablet Take 1 tablet (20 mg total) by mouth every 6 (six) hours. 60 tablet 1  . esomeprazole (NEXIUM) 40 MG capsule Take 20 mg by mouth daily.    Marland Kitchen glimepiride (AMARYL) 4 MG tablet Take 1 tablet (4 mg total) by mouth daily. 30 tablet 0  . Glucosamine Sulfate 1000 MG TABS Take 1,000 tablets by mouth 2 (two) times daily.    Marland Kitchen glucose blood (ONE TOUCH ULTRA TEST) test strip Check blood sugar daily. Dx: E11.9 50 each 12  . MAGNESIUM GLYCINATE PLUS PO Take 400 tablets by mouth 2 (two) times daily.    . Melatonin 3 MG TABS Take 3 tablets by mouth as needed.    . metaxalone (SKELAXIN) 800 MG tablet Take 800 tablets by mouth as needed.  0  . mometasone-formoterol (DULERA) 100-5 MCG/ACT AERO Inhale 2 puffs into the lungs 2 (two) times daily. 1 Inhaler 2  . niacin (SLO-NIACIN) 500 MG tablet Take 500 mg by mouth at bedtime.    . Pitavastatin Calcium 2 MG TABS Take 1 tablet (2 mg total) by mouth daily. 30 tablet 2  . rOPINIRole (REQUIP)  0.5 MG tablet Take 1 tablet (0.5 mg total) by mouth at bedtime. 30 tablet 5  . tiZANidine (ZANAFLEX) 4 MG capsule Take 1 capsule (4 mg total) by mouth 3 (three) times daily as needed for muscle spasms. 30 capsule 1  . [DISCONTINUED] citalopram (CELEXA) 10 MG tablet Take 1 tablet (10 mg total) by mouth daily. 90 tablet 0   No current facility-administered medications on file prior to visit.

## 2015-07-12 NOTE — Assessment & Plan Note (Signed)
Stable con't amlodapine

## 2015-07-14 ENCOUNTER — Encounter: Payer: Self-pay | Admitting: Family Medicine

## 2015-07-22 ENCOUNTER — Encounter: Payer: Self-pay | Admitting: Medical

## 2015-07-22 ENCOUNTER — Ambulatory Visit (INDEPENDENT_AMBULATORY_CARE_PROVIDER_SITE_OTHER): Payer: Medicare Other | Admitting: Medical

## 2015-07-22 VITALS — BP 110/78 | HR 71 | Temp 98.1°F | Ht 70.5 in | Wt 215.6 lb

## 2015-07-22 DIAGNOSIS — J0100 Acute maxillary sinusitis, unspecified: Secondary | ICD-10-CM

## 2015-07-22 DIAGNOSIS — J309 Allergic rhinitis, unspecified: Secondary | ICD-10-CM

## 2015-07-22 MED ORDER — AZELASTINE-FLUTICASONE 137-50 MCG/ACT NA SUSP: NASAL | Status: DC

## 2015-07-22 MED ORDER — AMOXICILLIN-POT CLAVULANATE 875-125 MG PO TABS: 1.0000 | ORAL_TABLET | Freq: Two times a day (BID) | ORAL | Status: DC

## 2015-07-22 NOTE — Progress Notes (Signed)
Subjective:    Patient ID: Bruce Romero, male    DOB: 20-Mar-1945, 70 y.o.   MRN: [id number]  HPI  Pt in for some sinus problems. States his symptoms present for 5-6 days. Pt states got congested. He tried netty spray rinse, and flonase. But symptoms persist. Pt has maxillary sinus pain but worse rt side. Some ear pressure. At first eye itched. Some pnd.   Review of Systems  Constitutional: Negative for chills, fatigue and unexpected weight change.  HENT: Positive for congestion, ear pain, postnasal drip and sinus pressure. Negative for nosebleeds, sneezing and sore throat.   Respiratory: Negative for cough, chest tightness and wheezing.   Cardiovascular: Negative for chest pain and palpitations.  Gastrointestinal: Negative for abdominal pain.  Musculoskeletal: Negative for back pain.  Skin: Negative for rash.  Neurological: Negative for dizziness and headaches.  Hematological: Negative for adenopathy. Does not bruise/bleed easily.  Psychiatric/Behavioral: Negative for behavioral problems and confusion.    Past Medical History  Diagnosis Date  . 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 Diabetes mellitus   . Hyperlipidemia   . Hypertension      Social History   Social History  . Marital Status: Married    Spouse Name: N/A  . Number of Children: N/A  . Years of Education: N/A   Occupational History  . Not on file.   Social History Main Topics  . Smoking status: Former Research scientist (life sciences)  . Smokeless tobacco: Never Used  . Alcohol Use: 0.0 oz/week    0 Cans of beer per week  . Drug Use: No  . Sexual Activity: Not on file   Other Topics Concern  . Not on file   Social History Narrative    Past Surgical History  Procedure Laterality Date  . Cholecystectomy      Family History  Problem Relation Age of Onset  . Stroke Mother   . Pulmonary embolism Father     Allergies  Allergen Reactions  . Cephalexin     REACTION: pt is not allergic to penicillin    . Flexeril [Cyclobenzaprine] Swelling    Current Outpatient Prescriptions on File Prior to Visit  Medication Sig Dispense Refill  . albuterol (PROAIR HFA) 108 (90 BASE) MCG/ACT inhaler Inhale 2 puffs into the lungs every 6 (six) hours as needed for wheezing. 1 Inhaler 2  . ALPRAZolam (XANAX) 0.25 MG tablet 1 po tid prn 30 tablet 0  . amLODipine (NORVASC) 10 MG tablet take 1 tablet by mouth once daily 90 tablet 1  . aspirin 81 MG tablet Take 81 mg by mouth daily.    Marland Kitchen atorvastatin (LIPITOR) 10 MG tablet Take 1 tablet (10 mg total) by mouth daily. 90 tablet 1  . Azelastine-Fluticasone (DYMISTA) 137-50 MCG/ACT SUSP 1 spray each nostril bid 1 Bottle 5  . Blood Glucose Monitoring Suppl (ONE TOUCH ULTRA 2) w/Device KIT See admin instructions.  0  . candesartan (ATACAND) 16 MG tablet Take 1 tablet (16 mg total) by mouth at bedtime. 90 tablet 1  . Cholecalciferol (VITAMIN D) 2000 UNITS tablet Take 2,000 Units by mouth daily.      Marland Kitchen co-enzyme Q-10 30 MG capsule Take 100 mg by mouth daily.    Marland Kitchen dicyclomine (BENTYL) 20 MG tablet Take 1 tablet (20 mg total) by mouth every 6 (six) hours. 60 tablet 1  . esomeprazole (NEXIUM) 40 MG capsule Take 20 mg by mouth daily.    Marland Kitchen glimepiride (AMARYL) 4  MG tablet Take 1 tablet (4 mg total) by mouth daily. 30 tablet 0  . Glucosamine Sulfate 1000 MG TABS Take 1,000 tablets by mouth 2 (two) times daily.    Marland Kitchen glucose blood (ONE TOUCH ULTRA TEST) test strip Check blood sugar daily. Dx: E11.9 50 each 12  . MAGNESIUM GLYCINATE PLUS PO Take 400 tablets by mouth 2 (two) times daily.    . Melatonin 3 MG TABS Take 3 tablets by mouth as needed.    . metaxalone (SKELAXIN) 800 MG tablet Take 800 tablets by mouth as needed.  0  . mometasone-formoterol (DULERA) 100-5 MCG/ACT AERO Inhale 2 puffs into the lungs 2 (two) times daily. 1 Inhaler 2  . niacin (SLO-NIACIN) 500 MG tablet Take 500 mg by mouth at bedtime.    . Pitavastatin Calcium 2 MG TABS Take 1 tablet (2 mg total) by  mouth daily. 30 tablet 2  . rOPINIRole (REQUIP) 0.5 MG tablet Take 1 tablet (0.5 mg total) by mouth at bedtime. 30 tablet 5  . SitaGLIPtin-MetFORMIN HCl [phone] MG TB24 Take 1 tablet by mouth daily. 90 tablet 1  . tiZANidine (ZANAFLEX) 4 MG capsule Take 1 capsule (4 mg total) by mouth 3 (three) times daily as needed for muscle spasms. 30 capsule 1  . [DISCONTINUED] citalopram (CELEXA) 10 MG tablet Take 1 tablet (10 mg total) by mouth daily. 90 tablet 0   No current facility-administered medications on file prior to visit.    BP 110/78 mmHg  Pulse 71  Temp(Src) 98.1 F (36.7 C) (Oral)  Ht 5' 10.5" (1.791 m)  Wt 215 lb 9.6 oz (97.796 kg)  BMI 30.49 kg/m2  SpO2 98%       Objective:   Physical Exam  General  Mental Status - Alert. General Appearance - Well groomed. Not in acute distress.  Skin Rashes- No Rashes.  HEENT Head- Normal. Ear Auditory Canal - Left- Normal. Right - Normal.Tympanic Membrane- Left- Normal. Right- Normal. Eye Sclera/Conjunctiva- Left- Normal. Right- Normal. Nose & Sinuses Nasal Mucosa- Left-  Boggy and Congested. Right-  Boggy and  Congested.Bilateral maxillary pressure but worst rt side and faint frontal sinus pressure. Mouth & Throat Lips: Upper Lip- Normal: no dryness, cracking, pallor, cyanosis, or vesicular eruption. Lower Lip-Normal: no dryness, cracking, pallor, cyanosis or vesicular eruption. Buccal Mucosa- Bilateral- No Aphthous ulcers. Oropharynx- No Discharge or Erythema. +pnd. Tonsils: Characteristics- Bilateral- No Erythema or Congestion. Size/Enlargement- Bilateral- No enlargement. Discharge- bilateral-None.  Neck Neck- Supple. No Masses.   Chest and Lung Exam Auscultation: Breath Sounds:-Clear even and unlabored.  Cardiovascular Auscultation:Rythm- Regular, rate and rhythm. Murmurs & Other Heart Sounds:Ausculatation of the heart reveal- No Murmurs.  Lymphatic Head & Neck General Head & Neck Lymphatics: Bilateral:  Description- No Localized lymphadenopathy.       Assessment & Plan:  For your nasal congestion and allergies start the dymista.  For sinus infection start the augmentin.(used in past no reaction)  If you worsen before you leave for vacation then notify us.  Follow up in 7 days or as needed

## 2015-07-22 NOTE — Patient Instructions (Signed)
For your nasal congestion and allergies start the dymista.  For sinus infection start the augmentin.  If you worsen before you leave for vacation then notify us.  Follow up in 7 days or as needed

## 2015-07-22 NOTE — Progress Notes (Signed)
Pre visit review using our clinic review tool, if applicable. No additional management support is needed unless otherwise documented below in the visit note. 

## 2015-08-07 ENCOUNTER — Other Ambulatory Visit: Payer: Self-pay | Admitting: Family Medicine

## 2015-08-10 ENCOUNTER — Encounter: Payer: Self-pay | Admitting: Family

## 2015-08-10 ENCOUNTER — Ambulatory Visit (INDEPENDENT_AMBULATORY_CARE_PROVIDER_SITE_OTHER): Payer: Medicare Other | Admitting: Family

## 2015-08-10 VITALS — BP 130/78 | HR 69 | Temp 98.2°F | Resp 16 | Ht 70.5 in | Wt 214.6 lb

## 2015-08-10 DIAGNOSIS — K589 Irritable bowel syndrome without diarrhea: Secondary | ICD-10-CM | POA: Diagnosis not present

## 2015-08-10 NOTE — Patient Instructions (Signed)
Please call if your symptoms worsen or if symptoms do not improve.

## 2015-08-10 NOTE — Progress Notes (Signed)
Pre visit review using our clinic review tool, if applicable. No additional management support is needed unless otherwise documented below in the visit note. 

## 2015-08-10 NOTE — Progress Notes (Signed)
Subjective:    Patient ID: Bruce Romero, male    DOB: 12-08-1945, 70 y.o.   MRN: [id number]  HPI  Bruce Romero is a 70 yr old male who presents today with chief complaint of abdominal bloating. pmhx is significant for GERD and IBS.  Current GI medications include:  PRN bentyl, nexium (otc).  Reports that Bentyl helps some with   Reports feeling bloated, belching.  Has been using miralax and citrucel. Has been having daily BM's.  Denies abdominal pain.  Reports that he has some left sided abdominal pain.  He had some mild left sided abdominal discomfort 1 week ago. Denies black/bloody stools. Notes nausea.  Appetite is diminished.  He denies reflux symptoms or fever.     Review of Systems See HPI  Past Medical History  Diagnosis Date  . 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 Diabetes mellitus   . Hyperlipidemia   . Hypertension      Social History   Social History  . Marital Status: Married    Spouse Name: N/A  . Number of Children: N/A  . Years of Education: N/A   Occupational History  . Not on file.   Social History Main Topics  . Smoking status: Former Research scientist (life sciences)  . Smokeless tobacco: Never Used  . Alcohol Use: 0.0 oz/week    0 Cans of beer per week  . Drug Use: No  . Sexual Activity: Not on file   Other Topics Concern  . Not on file   Social History Narrative    Past Surgical History  Procedure Laterality Date  . Cholecystectomy      Family History  Problem Relation Age of Onset  . Stroke Mother   . Pulmonary embolism Father     Allergies  Allergen Reactions  . Cephalexin     REACTION: pt is not allergic to penicillin  . Flexeril [Cyclobenzaprine] Swelling    Current Outpatient Prescriptions on File Prior to Visit  Medication Sig Dispense Refill  . albuterol (PROAIR HFA) 108 (90 BASE) MCG/ACT inhaler Inhale 2 puffs into the lungs every 6 (six) hours as needed for wheezing. 1 Inhaler 2  . ALPRAZolam (XANAX) 0.25 MG tablet 1 po tid  prn 30 tablet 0  . amLODipine (NORVASC) 10 MG tablet take 1 tablet by mouth once daily 90 tablet 1  . aspirin 81 MG tablet Take 81 mg by mouth daily.    Marland Kitchen atorvastatin (LIPITOR) 10 MG tablet Take 1 tablet (10 mg total) by mouth daily. 90 tablet 1  . Azelastine-Fluticasone (DYMISTA) 137-50 MCG/ACT SUSP 1 spray each nostril bid 1 Bottle 5  . Blood Glucose Monitoring Suppl (ONE TOUCH ULTRA 2) w/Device KIT See admin instructions.  0  . candesartan (ATACAND) 16 MG tablet take 1 tablet by mouth at bedtime 90 tablet 1  . Cholecalciferol (VITAMIN D) 2000 UNITS tablet Take 2,000 Units by mouth daily.      Marland Kitchen co-enzyme Q-10 30 MG capsule Take 100 mg by mouth daily.    Marland Kitchen dicyclomine (BENTYL) 20 MG tablet Take 1 tablet (20 mg total) by mouth every 6 (six) hours. 60 tablet 1  . esomeprazole (NEXIUM) 40 MG capsule Take 20 mg by mouth daily.    Marland Kitchen glimepiride (AMARYL) 4 MG tablet take 1 tablet once daily 30 tablet 0  . Glucosamine Sulfate 1000 MG TABS Take 1,000 tablets by mouth 2 (two) times daily.    Marland Kitchen glucose blood (ONE TOUCH ULTRA  TEST) test strip Check blood sugar daily. Dx: E11.9 50 each 12  . MAGNESIUM GLYCINATE PLUS PO Take 400 tablets by mouth 2 (two) times daily.    . Melatonin 3 MG TABS Take 3 tablets by mouth as needed.    . metaxalone (SKELAXIN) 800 MG tablet Take 800 tablets by mouth as needed.  0  . mometasone-formoterol (DULERA) 100-5 MCG/ACT AERO Inhale 2 puffs into the lungs 2 (two) times daily. 1 Inhaler 2  . niacin (SLO-NIACIN) 500 MG tablet Take 500 mg by mouth at bedtime.    . Pitavastatin Calcium 2 MG TABS Take 1 tablet (2 mg total) by mouth daily. 30 tablet 2  . rOPINIRole (REQUIP) 0.5 MG tablet Take 1 tablet (0.5 mg total) by mouth at bedtime. 30 tablet 5  . SitaGLIPtin-MetFORMIN HCl [phone] MG TB24 Take 1 tablet by mouth daily. 90 tablet 1  . tiZANidine (ZANAFLEX) 4 MG capsule Take 1 capsule (4 mg total) by mouth 3 (three) times daily as needed for muscle spasms. 30 capsule 1  .  [DISCONTINUED] citalopram (CELEXA) 10 MG tablet Take 1 tablet (10 mg total) by mouth daily. 90 tablet 0   No current facility-administered medications on file prior to visit.    BP 130/78 mmHg  Pulse 69  Temp(Src) 98.2 F (36.8 C) (Oral)  Resp 16  Ht 5' 10.5" (1.791 m)  Wt 214 lb 9.6 oz (97.342 kg)  BMI 30.35 kg/m2  SpO2 97%       Objective:   Physical Exam  Constitutional: He is oriented to person, place, and time. He appears well-developed and well-nourished. No distress.  HENT:  Head: Normocephalic and atraumatic.  Cardiovascular: Normal rate and regular rhythm.   No murmur heard. Pulmonary/Chest: Effort normal and breath sounds normal. No respiratory distress. He has no wheezes. He has no rales.  Abdominal: Soft. Bowel sounds are normal. He exhibits no distension. There is no tenderness.  Musculoskeletal: He exhibits no edema.  Neurological: He is alert and oriented to person, place, and time.  Skin: Skin is warm and dry.  Psychiatric: He has a normal mood and affect. His behavior is normal. Thought content normal.          Assessment & Plan:  IBS- pt requested GI cocktail. Gave GI cocktail. We discussed GI referral. He declines at this time.  I clinically doubt serious underlying etiology at this time since he is eating, drinking and abdominal exam is normal. He is instructed to call if new/worsening symptoms however and he verbalizes understanding.

## 2015-08-14 DIAGNOSIS — N411 Chronic prostatitis: Secondary | ICD-10-CM | POA: Diagnosis not present

## 2015-08-20 ENCOUNTER — Other Ambulatory Visit: Payer: Self-pay | Admitting: Family Medicine

## 2015-08-20 DIAGNOSIS — M25562 Pain in left knee: Secondary | ICD-10-CM | POA: Diagnosis not present

## 2015-08-20 MED ORDER — CANDESARTAN CILEXETIL 16 MG PO TABS: 16.0000 mg | ORAL_TABLET | Freq: Every day | ORAL | Status: DC

## 2015-08-20 MED ORDER — GLIMEPIRIDE 4 MG PO TABS: 4.0000 mg | ORAL_TABLET | Freq: Every day | ORAL | Status: DC

## 2015-09-02 ENCOUNTER — Encounter: Payer: Self-pay | Admitting: Family Medicine

## 2015-09-02 ENCOUNTER — Other Ambulatory Visit: Payer: Self-pay | Admitting: Family Medicine

## 2015-09-02 DIAGNOSIS — F411 Generalized anxiety disorder: Secondary | ICD-10-CM

## 2015-09-03 MED ORDER — AMLODIPINE BESYLATE 10 MG PO TABS: 10.0000 mg | ORAL_TABLET | Freq: Every day | ORAL | 1 refills | Status: DC

## 2015-09-03 MED ORDER — ALPRAZOLAM 0.25 MG PO TABS: ORAL_TABLET | ORAL | 0 refills | Status: DC

## 2015-09-03 NOTE — Telephone Encounter (Signed)
Last seen 07/02/15 and filled 03/05/15 #30  Please advise    KP

## 2015-09-03 NOTE — Telephone Encounter (Signed)
Rx faxed.    KP 

## 2015-09-15 DIAGNOSIS — M25562 Pain in left knee: Secondary | ICD-10-CM | POA: Diagnosis not present

## 2015-09-28 ENCOUNTER — Ambulatory Visit (INDEPENDENT_AMBULATORY_CARE_PROVIDER_SITE_OTHER): Payer: Medicare Other | Admitting: Family Medicine

## 2015-09-28 ENCOUNTER — Ambulatory Visit: Payer: Medicare Other | Admitting: Family Medicine

## 2015-09-28 ENCOUNTER — Encounter: Payer: Self-pay | Admitting: Family Medicine

## 2015-09-28 VITALS — BP 140/78 | HR 61 | Temp 97.0°F | Wt 215.2 lb

## 2015-09-28 DIAGNOSIS — K589 Irritable bowel syndrome without diarrhea: Secondary | ICD-10-CM

## 2015-09-28 DIAGNOSIS — R109 Unspecified abdominal pain: Secondary | ICD-10-CM | POA: Diagnosis not present

## 2015-09-28 LAB — CBC WITH DIFFERENTIAL/PLATELET
BASOS ABS: 0.1 10*3/uL (ref 0.0–0.1)
Basophils Relative: 0.8 % (ref 0.0–3.0)
EOS ABS: 0.4 10*3/uL (ref 0.0–0.7)
EOS PCT: 5.2 % — AB (ref 0.0–5.0)
HEMATOCRIT: 42.3 % (ref 39.0–52.0)
HEMOGLOBIN: 14.6 g/dL (ref 13.0–17.0)
Lymphocytes Relative: 24.2 % (ref 12.0–46.0)
Lymphs Abs: 2.1 10*3/uL (ref 0.7–4.0)
MCHC: 34.4 g/dL (ref 30.0–36.0)
MCV: 84.8 fl (ref 78.0–100.0)
MONOS PCT: 6.5 % (ref 3.0–12.0)
Monocytes Absolute: 0.6 10*3/uL (ref 0.1–1.0)
Neutro Abs: 5.5 10*3/uL (ref 1.4–7.7)
Neutrophils Relative %: 63.3 % (ref 43.0–77.0)
PLATELETS: 191 10*3/uL (ref 150.0–400.0)
RBC: 4.99 Mil/uL (ref 4.22–5.81)
RDW: 14.1 % (ref 11.5–15.5)
WBC: 8.7 10*3/uL (ref 4.0–10.5)

## 2015-09-28 LAB — COMPREHENSIVE METABOLIC PANEL
ALT: 28 U/L (ref 0–53)
AST: 34 U/L (ref 0–37)
Albumin: 4.4 g/dL (ref 3.5–5.2)
Alkaline Phosphatase: 51 U/L (ref 39–117)
BILIRUBIN TOTAL: 0.5 mg/dL (ref 0.2–1.2)
BUN: 15 mg/dL (ref 6–23)
CALCIUM: 9.4 mg/dL (ref 8.4–10.5)
CO2: 26 meq/L (ref 19–32)
CREATININE: 1 mg/dL (ref 0.40–1.50)
Chloride: 97 mEq/L (ref 96–112)
GFR: 78.25 mL/min (ref >60.00)
Glucose, Bld: 144 mg/dL — ABNORMAL HIGH (ref 70–99)
Potassium: 4.2 mEq/L (ref 3.5–5.1)
Sodium: 133 mEq/L — ABNORMAL LOW (ref 135–145)
TOTAL PROTEIN: 7.5 g/dL (ref 6.0–8.3)

## 2015-09-28 LAB — H. PYLORI ANTIBODY, IGG: H Pylori IgG: NEGATIVE

## 2015-09-28 LAB — AMYLASE: AMYLASE: 35 U/L (ref 27–131)

## 2015-09-28 MED ORDER — LINACLOTIDE 290 MCG PO CAPS: 290.0000 ug | ORAL_CAPSULE | Freq: Every day | ORAL | 5 refills | Status: DC

## 2015-09-28 NOTE — Progress Notes (Signed)
Patient ID: Bruce Romero, male    DOB: 18-Dec-1945  Age: 70 y.o. MRN: [id number]    Subjective:  Subjective  HPI Bruce Romero presents for abd pain L sided.  Taking miralax, gas x with some relief and BM helps.  He has had some constipation.  Pt was dx years ago with IBS- constipation Review of Systems  Constitutional: Negative for appetite change, diaphoresis, fatigue and unexpected weight change.  Eyes: Negative for pain, redness and visual disturbance.  Respiratory: Negative for cough, chest tightness, shortness of breath and wheezing.   Cardiovascular: Negative for chest pain, palpitations and leg swelling.  Gastrointestinal: Positive for abdominal pain and constipation. Negative for blood in stool, diarrhea, nausea, rectal pain and vomiting.  Endocrine: Negative for cold intolerance, heat intolerance, polydipsia, polyphagia and polyuria.  Genitourinary: Negative for difficulty urinating, dysuria and frequency.  Neurological: Negative for dizziness, light-headedness, numbness and headaches.    History Past Medical History:  Diagnosis Date  . Diabetes mellitus   . Hyperlipidemia   . Hypertension   . Spastic colon    anaphylaxis due to Bruce Romero    He has a past surgical history that includes Cholecystectomy.   His family history includes Pulmonary embolism in his father; Stroke in his mother.He reports that he has quit smoking. He has never used smokeless tobacco. He reports that he drinks alcohol. He reports that he does not use drugs.  Current Outpatient Prescriptions on File Prior to Visit  Medication Sig Dispense Refill  . albuterol (PROAIR HFA) 108 (90 BASE) MCG/ACT inhaler Inhale 2 puffs into the lungs every 6 (six) hours as needed for wheezing. 1 Inhaler 2  . ALPRAZolam (XANAX) 0.25 MG tablet 1 po tid prn 30 tablet 0  . amLODipine (NORVASC) 10 MG tablet Take 1 tablet (10 mg total) by mouth daily. 90 tablet 1  . aspirin 81 MG tablet Take 81 mg by mouth daily.      Marland Kitchen atorvastatin (LIPITOR) 10 MG tablet Take 1 tablet (10 mg total) by mouth daily. 90 tablet 1  . Azelastine-Fluticasone (DYMISTA) 137-50 MCG/ACT SUSP 1 spray each nostril bid 1 Bottle 5  . Blood Glucose Monitoring Suppl (ONE TOUCH ULTRA 2) w/Device KIT See admin instructions.  0  . candesartan (ATACAND) 16 MG tablet Take 1 tablet (16 mg total) by mouth at bedtime. 90 tablet 1  . Cholecalciferol (VITAMIN D) 2000 UNITS tablet Take 2,000 Units by mouth daily.      Marland Kitchen co-enzyme Q-10 30 MG capsule Take 100 mg by mouth daily.    Marland Kitchen dicyclomine (BENTYL) 20 MG tablet Take 1 tablet (20 mg total) by mouth every 6 (six) hours. 60 tablet 1  . esomeprazole (NEXIUM) 40 MG capsule Take 20 mg by mouth daily.    Marland Kitchen glimepiride (AMARYL) 4 MG tablet Take 1 tablet (4 mg total) by mouth daily. 90 tablet 1  . Glucosamine Sulfate 1000 MG TABS Take 1,000 tablets by mouth 2 (two) times daily.    Marland Kitchen glucose blood (ONE TOUCH ULTRA TEST) test strip Check blood sugar daily. Dx: E11.9 50 each 12  . MAGNESIUM GLYCINATE PLUS PO Take 400 tablets by mouth 2 (two) times daily.    . Melatonin 3 MG TABS Take 3 tablets by mouth as needed.    . metaxalone (SKELAXIN) 800 MG tablet Take 800 tablets by mouth as needed.  0  . mometasone-formoterol (DULERA) 100-5 MCG/ACT AERO Inhale 2 puffs into the lungs 2 (two) times daily. 1 Inhaler 2  .  niacin (SLO-NIACIN) 500 MG tablet Take 500 mg by mouth at bedtime.    . Pitavastatin Calcium 2 MG TABS Take 1 tablet (2 mg total) by mouth daily. 30 tablet 2  . rOPINIRole (REQUIP) 0.5 MG tablet Take 1 tablet (0.5 mg total) by mouth at bedtime. 30 tablet 5  . SitaGLIPtin-MetFORMIN HCl [phone] MG TB24 Take 1 tablet by mouth daily. 90 tablet 1  . tiZANidine (ZANAFLEX) 4 MG capsule Take 1 capsule (4 mg total) by mouth 3 (three) times daily as needed for muscle spasms. 30 capsule 1  . [DISCONTINUED] citalopram (CELEXA) 10 MG tablet Take 1 tablet (10 mg total) by mouth daily. 90 tablet 0   No current  facility-administered medications on file prior to visit.      Objective:  Objective  Physical Exam  Constitutional: He is oriented to person, place, and time. Vital signs are normal. He appears well-developed and well-nourished. He is sleeping.  HENT:  Head: Normocephalic and atraumatic.  Mouth/Throat: Oropharynx is clear and moist.  Eyes: EOM are normal. Pupils are equal, round, and reactive to light.  Neck: Normal range of motion. Neck supple. No thyromegaly present.  Cardiovascular: Normal rate and regular rhythm.   No murmur heard. Pulmonary/Chest: Effort normal and breath sounds normal. No respiratory distress. He has no wheezes. He has no rales. He exhibits no tenderness.  Abdominal: Soft. He exhibits no distension and no mass. Bowel sounds are increased. There is no tenderness. There is no rebound and no guarding.  Musculoskeletal: He exhibits no edema or tenderness.  Neurological: He is alert and oriented to person, place, and time.  Skin: Skin is warm and dry.  Psychiatric: He has a normal mood and affect. His behavior is normal. Judgment and thought content normal.  Nursing note and vitals reviewed.  BP 140/78 (BP Location: Left Arm, Patient Position: Sitting, Cuff Size: Normal)   Pulse 61   Temp 97 F (36.1 C) (Oral)   Wt 215 lb 3.2 oz (97.6 kg)   SpO2 98%   BMI 30.44 kg/m  Wt Readings from Last 3 Encounters:  09/28/15 215 lb 3.2 oz (97.6 kg)  08/10/15 214 lb 9.6 oz (97.3 kg)  07/22/15 215 lb 9.6 oz (97.8 kg)     Lab Results  Component Value Date   WBC 8.7 09/28/2015   HGB 14.6 09/28/2015   HCT 42.3 09/28/2015   PLT 191.0 09/28/2015   GLUCOSE 144 (H) 09/28/2015   CHOL 115 07/03/2015   TRIG 92.0 07/03/2015   HDL 41.20 07/03/2015   LDLCALC 56 07/03/2015   ALT 28 09/28/2015   AST 34 09/28/2015   NA 133 (L) 09/28/2015   K 4.2 09/28/2015   CL 97 09/28/2015   CREATININE 1.00 09/28/2015   BUN 15 09/28/2015   CO2 26 09/28/2015   TSH 0.979 12/30/2010   PSA  0.71 09/16/2013   HGBA1C 6.7 (H) 07/03/2015   MICROALBUR 0.1 04/22/2014    No results found.   Assessment & Plan:  Plan  I am having Bruce Romero start on linaclotide. I am also having him maintain his Vitamin D, tiZANidine, esomeprazole, niacin, rOPINIRole, mometasone-formoterol, albuterol, Pitavastatin Calcium, metaxalone, co-enzyme Q-10, Melatonin, Glucosamine Sulfate, MAGNESIUM GLYCINATE PLUS PO, aspirin, ONE TOUCH ULTRA 2, dicyclomine, glucose blood, atorvastatin, SitaGLIPtin-MetFORMIN HCl, Azelastine-Fluticasone, glimepiride, candesartan, ALPRAZolam, amLODipine, and meloxicam.  Meds ordered this encounter  Medications  . meloxicam (MOBIC) 15 MG tablet    Sig: Take 1 tablet by mouth daily.  Marland Kitchen linaclotide (LINZESS) [place]  capsule    Sig: Take 1 capsule (290 mcg total) by mouth daily before breakfast.    Dispense:  30 capsule    Refill:  5    Problem List Items Addressed This Visit    None    Visit Diagnoses    IBS (irritable bowel syndrome)    -  Primary   Relevant Medications   linaclotide (LINZESS) 290 MCG CAPS capsule   Abdominal pain in male       Relevant Orders   CBC with Differential/Platelet (Completed)   Comprehensive metabolic panel (Completed)   H. pylori antibody, IgG (Completed)   Amylase (Completed)    if no improvement with med--- consider referral to GI  Follow-up: Return if symptoms worsen or fail to improve.  Ann Held, DO

## 2015-09-28 NOTE — Patient Instructions (Signed)
ibs Diet for Irritable Bowel Syndrome When you have irritable bowel syndrome (IBS), the foods you eat and your eating habits are very important. IBS may cause various symptoms, such as abdominal pain, constipation, or diarrhea. Choosing the right foods can help ease discomfort caused by these symptoms. Work with your health care provider and dietitian to find the best eating plan to help control your symptoms. WHAT GENERAL GUIDELINES DO I NEED TO FOLLOW?  Keep a food diary. This will help you identify foods that cause symptoms. Write down:  What you eat and when.  What symptoms you have.  When symptoms occur in relation to your meals.  Avoid foods that cause symptoms. Talk with your dietitian about other ways to get the same nutrients that are in these foods.  Eat more foods that contain fiber. Take a fiber supplement if directed by your dietitian.  Eat your meals slowly, in a relaxed setting.  Aim to eat 5-6 small meals per day. Do not skip meals.  Drink enough fluids to keep your urine clear or pale yellow.  Ask your health care provider if you should take an over-the-counter probiotic during flare-ups to help restore healthy gut bacteria.  If you have cramping or diarrhea, try making your meals low in fat and high in carbohydrates. Examples of carbohydrates are pasta, rice, whole grain breads and cereals, fruits, and vegetables.  If dairy products cause your symptoms to flare up, try eating less of them. You might be able to handle yogurt better than other dairy products because it contains bacteria that help with digestion. WHAT FOODS ARE NOT RECOMMENDED? The following are some foods and drinks that may worsen your symptoms:  Fatty foods, such as [place] fries.  Milk products, such as cheese or ice cream.  Chocolate.  Alcohol.  Products with caffeine, such as coffee.  Carbonated drinks, such as soda. The items listed above may not be a complete list of foods and beverages  to avoid. Contact your dietitian for more information. WHAT FOODS ARE GOOD SOURCES OF FIBER? Your health care provider or dietitian may recommend that you eat more foods that contain fiber. Fiber can help reduce constipation and other IBS symptoms. Add foods with fiber to your diet a little at a time so that your body can get used to them. Too much fiber at once might cause gas and swelling of your abdomen. The following are some foods that are good sources of fiber:  Apples.  Peaches.  Pears.  Berries.  Figs.  Broccoli (raw).  Cabbage.  Carrots.  Raw peas.  Kidney beans.  Lima beans.  Whole grain bread.  Whole grain cereal. FOR MORE INFORMATION  International Foundation for Functional Gastrointestinal Disorders: www.iffgd.[employer] and [employer]estive [employer] Kidney [employer]: NetworkAffair.co.za.aspx   This information is not intended to replace advice given to you by your health care provider. Make sure you discuss any questions you have with your health care provider.   Document Released: 04/16/2003 Document Revised: 02/14/2014 Document Reviewed: 04/26/2013 Elsevier Interactive Patient Education [employer].

## 2015-09-28 NOTE — Progress Notes (Signed)
Pre visit review using our clinic review tool, if applicable. No additional management support is needed unless otherwise documented below in the visit note. 

## 2015-10-05 DIAGNOSIS — L814 Other melanin hyperpigmentation: Secondary | ICD-10-CM | POA: Diagnosis not present

## 2015-10-05 DIAGNOSIS — L570 Actinic keratosis: Secondary | ICD-10-CM | POA: Diagnosis not present

## 2015-10-05 DIAGNOSIS — L821 Other seborrheic keratosis: Secondary | ICD-10-CM | POA: Diagnosis not present

## 2015-10-05 DIAGNOSIS — D225 Melanocytic nevi of trunk: Secondary | ICD-10-CM | POA: Diagnosis not present

## 2015-10-05 DIAGNOSIS — D1801 Hemangioma of skin and subcutaneous tissue: Secondary | ICD-10-CM | POA: Diagnosis not present

## 2015-10-12 ENCOUNTER — Encounter: Payer: Self-pay | Admitting: Family Medicine

## 2015-10-13 ENCOUNTER — Other Ambulatory Visit: Payer: Self-pay

## 2015-10-13 ENCOUNTER — Ambulatory Visit (HOSPITAL_BASED_OUTPATIENT_CLINIC_OR_DEPARTMENT_OTHER)
Admission: RE | Admit: 2015-10-13 | Discharge: 2015-10-13 | Disposition: A | Discharge status: Home / Self Care | Payer: Medicare Other | Source: Ambulatory Visit | Attending: Family Medicine | Admitting: Family Medicine

## 2015-10-13 DIAGNOSIS — I708 Atherosclerosis of other arteries: Secondary | ICD-10-CM | POA: Diagnosis not present

## 2015-10-13 DIAGNOSIS — Q438 Other specified congenital malformations of intestine: Secondary | ICD-10-CM | POA: Diagnosis not present

## 2015-10-13 DIAGNOSIS — R1013 Epigastric pain: Secondary | ICD-10-CM | POA: Insufficient documentation

## 2015-10-13 DIAGNOSIS — M47895 Other spondylosis, thoracolumbar region: Secondary | ICD-10-CM | POA: Insufficient documentation

## 2015-10-13 DIAGNOSIS — K760 Fatty (change of) liver, not elsewhere classified: Secondary | ICD-10-CM | POA: Diagnosis not present

## 2015-10-13 DIAGNOSIS — I701 Atherosclerosis of renal artery: Secondary | ICD-10-CM | POA: Insufficient documentation

## 2015-10-13 DIAGNOSIS — I700 Atherosclerosis of aorta: Secondary | ICD-10-CM | POA: Insufficient documentation

## 2015-10-13 DIAGNOSIS — K449 Diaphragmatic hernia without obstruction or gangrene: Secondary | ICD-10-CM | POA: Insufficient documentation

## 2015-10-13 DIAGNOSIS — K5730 Diverticulosis of large intestine without perforation or abscess without bleeding: Secondary | ICD-10-CM | POA: Insufficient documentation

## 2015-10-13 MED ORDER — CIPROFLOXACIN HCL 500 MG PO TABS: 500.0000 mg | ORAL_TABLET | Freq: Two times a day (BID) | ORAL | 0 refills | Status: DC

## 2015-10-13 MED ORDER — IOPAMIDOL (ISOVUE-300) INJECTION 61%
100.0000 mL | Freq: Once | INTRAVENOUS | Status: AC | PRN
  Administered 2015-10-13: 100 mL via INTRAVENOUS

## 2015-10-13 NOTE — Telephone Encounter (Signed)
cipro 500 mg bid x 10 days  Ct abd/pelvis with contrast ---- dx abd pain    R/o diverticuitis If ct normal -- refer to gi

## 2015-10-14 ENCOUNTER — Other Ambulatory Visit: Payer: Self-pay | Admitting: Medical

## 2015-10-15 ENCOUNTER — Ambulatory Visit (INDEPENDENT_AMBULATORY_CARE_PROVIDER_SITE_OTHER): Payer: Medicare Other | Admitting: Behavioral Health

## 2015-10-15 ENCOUNTER — Ambulatory Visit: Payer: Medicare Other

## 2015-10-15 DIAGNOSIS — Z23 Encounter for immunization: Secondary | ICD-10-CM | POA: Diagnosis not present

## 2015-10-15 NOTE — Progress Notes (Addendum)
Pre visit review using our clinic review tool, if applicable. No additional management support is needed unless otherwise documented below in the visit note.  Patient in clinic today for flu shot. IM given in Left Deltoid. Patient tolerated injection well.

## 2015-10-22 DIAGNOSIS — M25562 Pain in left knee: Secondary | ICD-10-CM | POA: Diagnosis not present

## 2015-10-29 ENCOUNTER — Ambulatory Visit (INDEPENDENT_AMBULATORY_CARE_PROVIDER_SITE_OTHER): Payer: Medicare Other | Admitting: Medical

## 2015-10-29 ENCOUNTER — Other Ambulatory Visit: Payer: Self-pay | Admitting: Orthopaedic Surgery

## 2015-10-29 ENCOUNTER — Encounter: Payer: Self-pay | Admitting: Medical

## 2015-10-29 VITALS — BP 137/76 | HR 83 | Temp 98.2°F | Ht 70.5 in | Wt 216.4 lb

## 2015-10-29 DIAGNOSIS — J0100 Acute maxillary sinusitis, unspecified: Secondary | ICD-10-CM | POA: Diagnosis not present

## 2015-10-29 DIAGNOSIS — M25562 Pain in left knee: Secondary | ICD-10-CM

## 2015-10-29 DIAGNOSIS — J309 Allergic rhinitis, unspecified: Secondary | ICD-10-CM

## 2015-10-29 MED ORDER — AMOXICILLIN-POT CLAVULANATE 875-125 MG PO TABS: 1.0000 | ORAL_TABLET | Freq: Two times a day (BID) | ORAL | 0 refills | Status: DC

## 2015-10-29 MED ORDER — BENZONATATE 200 MG PO CAPS: 200.0000 mg | ORAL_CAPSULE | Freq: Three times a day (TID) | ORAL | 0 refills | Status: DC | PRN

## 2015-10-29 NOTE — Patient Instructions (Addendum)
Probable recurrent sinusitis with associate allergic rhinitis. Some pressure in ear tube as well.  Recommend restarting flonase. Rx augmentin antibiotic. If cough worsens while on vacation the benzonatate rx.  Follow up in 2 weeks or as needed.  Pt verified can take augmentin with  no reaction. Pt declined depomedrol injection since it has increased BS in past and he does not want to have to use insulin.

## 2015-10-29 NOTE — Progress Notes (Signed)
Subjective:    Patient ID: Bruce Romero, male    DOB: 31-Mar-1945, 70 y.o.   MRN: [id number]  HPI  Pt in with sinus congestion and some left ear pressure. He get this 3-4 times a year. In past augmentin usually is best treatment per his report.  He has tried humidifyer and states has dymista. He not sure if that worked in the past. Tried otc nasal steroids in past and never helped him.  Pressure in sinus for 3-4 days. Pt is going out of town and does not want to be sick.(Will be in [facility] for 2 weeks)  Pt is diabetic. His sugar this am was 130.  Check sugars about 3 times week and sugars are usually low 100's.         Review of Systems  Constitutional: Negative for chills and fatigue.  HENT: Positive for congestion, ear pain, postnasal drip and sinus pressure. Negative for rhinorrhea.   Eyes: Negative for redness.       Watery eyes.  Respiratory: Negative for chest tightness.   Cardiovascular: Negative for palpitations.  Musculoskeletal: Negative for back pain.  Hematological: Negative for adenopathy. Does not bruise/bleed easily.    Past Medical History:  Diagnosis Date  . Diabetes mellitus   . Hyperlipidemia   . Hypertension   . Spastic colon    anaphylaxis due to Keflex 1988     Social History   Social History  . Marital status: Married    Spouse name: N/A  . Number of children: N/A  . Years of education: N/A   Occupational History  . Not on file.   Social History Main Topics  . Smoking status: Former Research scientist (life sciences)  . Smokeless tobacco: Never Used  . Alcohol use 0.0 oz/week  . Drug use: No  . Sexual activity: Not on file   Other Topics Concern  . Not on file   Social History Narrative  . No narrative on file    Past Surgical History:  Procedure Laterality Date  . CHOLECYSTECTOMY      Family History  Problem Relation Age of Onset  . Stroke Mother   . Pulmonary embolism Father     Allergies  Allergen Reactions  . Cephalexin    REACTION: pt is not allergic to penicillin  . Flexeril [Cyclobenzaprine] Swelling    Current Outpatient Prescriptions on File Prior to Visit  Medication Sig Dispense Refill  . albuterol (PROAIR HFA) 108 (90 BASE) MCG/ACT inhaler Inhale 2 puffs into the lungs every 6 (six) hours as needed for wheezing. 1 Inhaler 2  . ALPRAZolam (XANAX) 0.25 MG tablet 1 po tid prn 30 tablet 0  . amLODipine (NORVASC) 10 MG tablet Take 1 tablet (10 mg total) by mouth daily. 90 tablet 1  . aspirin 81 MG tablet Take 81 mg by mouth daily.    Marland Kitchen atorvastatin (LIPITOR) 10 MG tablet Take 1 tablet (10 mg total) by mouth daily. 90 tablet 1  . Blood Glucose Monitoring Suppl (ONE TOUCH ULTRA 2) w/Device KIT See admin instructions.  0  . candesartan (ATACAND) 16 MG tablet Take 1 tablet (16 mg total) by mouth at bedtime. 90 tablet 1  . Cholecalciferol (VITAMIN D) 2000 UNITS tablet Take 2,000 Units by mouth daily.      Marland Kitchen co-enzyme Q-10 30 MG capsule Take 100 mg by mouth daily.    Marland Kitchen dicyclomine (BENTYL) 20 MG tablet take 1 tablet by mouth every 6 hours 60 tablet 1  . esomeprazole (NEXIUM)  40 MG capsule Take 20 mg by mouth daily.    Marland Kitchen glimepiride (AMARYL) 4 MG tablet Take 1 tablet (4 mg total) by mouth daily. 90 tablet 1  . Glucosamine Sulfate 1000 MG TABS Take 1,000 tablets by mouth 2 (two) times daily.    Marland Kitchen glucose blood (ONE TOUCH ULTRA TEST) test strip Check blood sugar daily. Dx: E11.9 50 each 12  . linaclotide (LINZESS) 290 MCG CAPS capsule Take 1 capsule (290 mcg total) by mouth daily before breakfast. 30 capsule 5  . MAGNESIUM GLYCINATE PLUS PO Take 400 tablets by mouth 2 (two) times daily.    . Melatonin 3 MG TABS Take 3 tablets by mouth as needed.    . meloxicam (MOBIC) 15 MG tablet Take 1 tablet by mouth daily.    . metaxalone (SKELAXIN) 800 MG tablet Take 800 tablets by mouth as needed.  0  . niacin (SLO-NIACIN) 500 MG tablet Take 500 mg by mouth at bedtime.    . Pitavastatin Calcium 2 MG TABS Take 1 tablet (2 mg  total) by mouth daily. 30 tablet 2  . tiZANidine (ZANAFLEX) 4 MG capsule Take 1 capsule (4 mg total) by mouth 3 (three) times daily as needed for muscle spasms. 30 capsule 1  . Azelastine-Fluticasone (DYMISTA) 137-50 MCG/ACT SUSP 1 spray each nostril bid (Patient not taking: Reported on 10/29/2015) 1 Bottle 5  . ciprofloxacin (CIPRO) 500 MG tablet Take 1 tablet (500 mg total) by mouth 2 (two) times daily. 20 tablet 0  . mometasone-formoterol (DULERA) 100-5 MCG/ACT AERO Inhale 2 puffs into the lungs 2 (two) times daily. (Patient not taking: Reported on 10/29/2015) 1 Inhaler 2  . rOPINIRole (REQUIP) 0.5 MG tablet Take 1 tablet (0.5 mg total) by mouth at bedtime. (Patient not taking: Reported on 10/29/2015) 30 tablet 5  . SitaGLIPtin-MetFORMIN HCl [phone] MG TB24 Take 1 tablet by mouth daily. (Patient not taking: Reported on 10/29/2015) 90 tablet 1  . [DISCONTINUED] citalopram (CELEXA) 10 MG tablet Take 1 tablet (10 mg total) by mouth daily. 90 tablet 0   No current facility-administered medications on file prior to visit.     BP 137/76   Pulse 83   Temp 98.2 F (36.8 C) (Oral)   Ht 5' 10.5" (1.791 m)   Wt 216 lb 6.4 oz (98.2 kg)   SpO2 100%   BMI 30.61 kg/m       Objective:   Physical Exam  General  Mental Status - Alert. General Appearance - Well groomed. Not in acute distress.  Skin Rashes- No Rashes.  HEENT Head- Normal. Ear Auditory Canal - Left- Normal. Right - Normal.Tympanic Membrane- Left- Normal. Right- Normal. Eye Sclera/Conjunctiva- Left- Normal. Right- Normal. Nose & Sinuses Nasal Mucosa- Left-  Boggy and Congested. Right-  Boggy and  Congested.Bilateral moderate rt side  Maxillary pressure but no  frontal sinus pressure. Mouth & Throat Lips: Upper Lip- Normal: no dryness, cracking, pallor, cyanosis, or vesicular eruption. Lower Lip-Normal: no dryness, cracking, pallor, cyanosis or vesicular eruption. Buccal Mucosa- Bilateral- No Aphthous ulcers. Oropharynx- No  Discharge or Erythema. Tonsils: Characteristics- Bilateral- No Erythema or Congestion. Size/Enlargement- Bilateral- No enlargement. Discharge- bilateral-None.  Neck Neck- Supple. No Masses.   Chest and Lung Exam Auscultation: Breath Sounds:-Clear even and unlabored.  Cardiovascular Auscultation:Rythm- Regular, rate and rhythm. Murmurs & Other Heart Sounds:Ausculatation of the heart reveal- No Murmurs.  Lymphatic Head & Neck General Head & Neck Lymphatics: Bilateral: Description- No Localized lymphadenopathy.        Assessment &  Plan:  Probable recurrent sinusitis with asscociates allergic rhinitis. Some pressure in ear tube as well.  Recommend restarting flonase. Rx augmentin antibiotic. If cough worsens while on vacation the benzonatate rx.  Follow up in 2 weeks or as needed.  Pt verified can take augmentin with  no reaction. Pt declined depomedrol injection since it has increased BS in past and he does not want to have to use insulin.

## 2015-11-07 ENCOUNTER — Ambulatory Visit
Admission: RE | Admit: 2015-11-07 | Discharge: 2015-11-07 | Disposition: A | Discharge status: Home / Self Care | Payer: Medicare Other | Source: Ambulatory Visit | Attending: Orthopaedic Surgery | Admitting: Orthopaedic Surgery

## 2015-11-07 DIAGNOSIS — M25562 Pain in left knee: Secondary | ICD-10-CM

## 2015-11-11 DIAGNOSIS — M25562 Pain in left knee: Secondary | ICD-10-CM | POA: Diagnosis not present

## 2015-11-18 DIAGNOSIS — S8391XD Sprain of unspecified site of right knee, subsequent encounter: Secondary | ICD-10-CM | POA: Diagnosis not present

## 2015-11-18 DIAGNOSIS — M25562 Pain in left knee: Secondary | ICD-10-CM | POA: Diagnosis not present

## 2015-11-20 DIAGNOSIS — S8391XD Sprain of unspecified site of right knee, subsequent encounter: Secondary | ICD-10-CM | POA: Diagnosis not present

## 2015-11-20 DIAGNOSIS — M25562 Pain in left knee: Secondary | ICD-10-CM | POA: Diagnosis not present

## 2015-11-23 DIAGNOSIS — M25562 Pain in left knee: Secondary | ICD-10-CM | POA: Diagnosis not present

## 2015-11-23 DIAGNOSIS — S8391XD Sprain of unspecified site of right knee, subsequent encounter: Secondary | ICD-10-CM | POA: Diagnosis not present

## 2015-12-02 DIAGNOSIS — S8391XD Sprain of unspecified site of right knee, subsequent encounter: Secondary | ICD-10-CM | POA: Diagnosis not present

## 2015-12-02 DIAGNOSIS — M25562 Pain in left knee: Secondary | ICD-10-CM | POA: Diagnosis not present

## 2015-12-09 DIAGNOSIS — M1712 Unilateral primary osteoarthritis, left knee: Secondary | ICD-10-CM | POA: Diagnosis not present

## 2015-12-09 DIAGNOSIS — M25562 Pain in left knee: Secondary | ICD-10-CM | POA: Diagnosis not present

## 2015-12-21 ENCOUNTER — Other Ambulatory Visit: Payer: Self-pay | Admitting: Family Medicine

## 2016-01-06 ENCOUNTER — Other Ambulatory Visit: Payer: Self-pay | Admitting: Orthopaedic Surgery

## 2016-01-06 DIAGNOSIS — M1712 Unilateral primary osteoarthritis, left knee: Secondary | ICD-10-CM | POA: Diagnosis not present

## 2016-01-07 ENCOUNTER — Other Ambulatory Visit (INDEPENDENT_AMBULATORY_CARE_PROVIDER_SITE_OTHER): Payer: Medicare Other

## 2016-01-07 DIAGNOSIS — E785 Hyperlipidemia, unspecified: Secondary | ICD-10-CM

## 2016-01-07 DIAGNOSIS — E1151 Type 2 diabetes mellitus with diabetic peripheral angiopathy without gangrene: Secondary | ICD-10-CM | POA: Diagnosis not present

## 2016-01-07 DIAGNOSIS — I10 Essential (primary) hypertension: Secondary | ICD-10-CM | POA: Diagnosis not present

## 2016-01-07 LAB — LIPID PANEL
CHOL/HDL RATIO: 3
Cholesterol: 120 mg/dL (ref 0–200)
HDL: 45.2 mg/dL (ref >39.00)
LDL Cholesterol: 53 mg/dL (ref 0–99)
NONHDL: 74.42
TRIGLYCERIDES: 108 mg/dL (ref 0.0–149.0)
VLDL: 21.6 mg/dL (ref 0.0–40.0)

## 2016-01-07 LAB — COMPREHENSIVE METABOLIC PANEL
ALK PHOS: 43 U/L (ref 39–117)
ALT: 30 U/L (ref 0–53)
AST: 43 U/L — AB (ref 0–37)
Albumin: 4.2 g/dL (ref 3.5–5.2)
BILIRUBIN TOTAL: 0.6 mg/dL (ref 0.2–1.2)
BUN: 16 mg/dL (ref 6–23)
CO2: 27 meq/L (ref 19–32)
CREATININE: 1.03 mg/dL (ref 0.40–1.50)
Calcium: 9.4 mg/dL (ref 8.4–10.5)
Chloride: 99 mEq/L (ref 96–112)
GFR: 75.57 mL/min (ref >60.00)
GLUCOSE: 149 mg/dL — AB (ref 70–99)
Potassium: 4.2 mEq/L (ref 3.5–5.1)
Sodium: 135 mEq/L (ref 135–145)
TOTAL PROTEIN: 7.1 g/dL (ref 6.0–8.3)

## 2016-01-07 LAB — HEMOGLOBIN A1C: Hgb A1c MFr Bld: 6.8 % — ABNORMAL HIGH (ref 4.6–6.5)

## 2016-01-11 ENCOUNTER — Encounter (HOSPITAL_BASED_OUTPATIENT_CLINIC_OR_DEPARTMENT_OTHER): Payer: Self-pay | Admitting: *Deleted

## 2016-01-12 ENCOUNTER — Telehealth: Payer: Self-pay

## 2016-01-12 ENCOUNTER — Encounter (HOSPITAL_BASED_OUTPATIENT_CLINIC_OR_DEPARTMENT_OTHER)
Admission: RE | Admit: 2016-01-12 | Discharge: 2016-01-12 | Disposition: A | Discharge status: Home / Self Care | Payer: Medicare Other | Source: Ambulatory Visit | Attending: Orthopaedic Surgery | Admitting: Orthopaedic Surgery

## 2016-01-12 ENCOUNTER — Other Ambulatory Visit: Payer: Self-pay | Admitting: Family Medicine

## 2016-01-12 DIAGNOSIS — Z7982 Long term (current) use of aspirin: Secondary | ICD-10-CM | POA: Diagnosis not present

## 2016-01-12 DIAGNOSIS — K219 Gastro-esophageal reflux disease without esophagitis: Secondary | ICD-10-CM | POA: Diagnosis not present

## 2016-01-12 DIAGNOSIS — F411 Generalized anxiety disorder: Secondary | ICD-10-CM

## 2016-01-12 DIAGNOSIS — R7309 Other abnormal glucose: Secondary | ICD-10-CM

## 2016-01-12 DIAGNOSIS — M25562 Pain in left knee: Secondary | ICD-10-CM | POA: Diagnosis present

## 2016-01-12 DIAGNOSIS — Z87891 Personal history of nicotine dependence: Secondary | ICD-10-CM | POA: Diagnosis not present

## 2016-01-12 DIAGNOSIS — Z79899 Other long term (current) drug therapy: Secondary | ICD-10-CM | POA: Diagnosis not present

## 2016-01-12 DIAGNOSIS — M1712 Unilateral primary osteoarthritis, left knee: Secondary | ICD-10-CM | POA: Diagnosis not present

## 2016-01-12 DIAGNOSIS — E7800 Pure hypercholesterolemia, unspecified: Secondary | ICD-10-CM

## 2016-01-12 DIAGNOSIS — E119 Type 2 diabetes mellitus without complications: Secondary | ICD-10-CM | POA: Diagnosis not present

## 2016-01-12 DIAGNOSIS — Z7984 Long term (current) use of oral hypoglycemic drugs: Secondary | ICD-10-CM | POA: Diagnosis not present

## 2016-01-12 DIAGNOSIS — X58XXXA Exposure to other specified factors, initial encounter: Secondary | ICD-10-CM | POA: Diagnosis not present

## 2016-01-12 DIAGNOSIS — E785 Hyperlipidemia, unspecified: Secondary | ICD-10-CM | POA: Diagnosis not present

## 2016-01-12 DIAGNOSIS — I10 Essential (primary) hypertension: Secondary | ICD-10-CM | POA: Diagnosis not present

## 2016-01-12 DIAGNOSIS — S83242A Other tear of medial meniscus, current injury, left knee, initial encounter: Secondary | ICD-10-CM | POA: Diagnosis not present

## 2016-01-12 DIAGNOSIS — F419 Anxiety disorder, unspecified: Secondary | ICD-10-CM | POA: Diagnosis not present

## 2016-01-12 DIAGNOSIS — K589 Irritable bowel syndrome without diarrhea: Secondary | ICD-10-CM | POA: Diagnosis not present

## 2016-01-12 NOTE — Telephone Encounter (Signed)
-----   Message from Ann Held, DO sent at 01/11/2016 10:27 PM EST ----- Dm-- hgba1c creeping up--- watch diet and con't med Recheck 3 months Lipid, cmp, hgba1c

## 2016-01-12 NOTE — Telephone Encounter (Signed)
Received Alprazolam Rx (0.25mg  TID PRN) from [facility] ([address]) in [place].  Last Ov: 09/28/2015 Last Rf: 09/03/2015  [facility] Contract signed on 04/30/2013. No sample given.  Rx printed and forwarded to Provider for review and signature.

## 2016-01-12 NOTE — Telephone Encounter (Signed)
Future lab orders entered recheck 3 mths. LB

## 2016-01-13 ENCOUNTER — Telehealth: Payer: Self-pay

## 2016-01-13 NOTE — H&P (Signed)
Bruce Romero is an 70 y.o. male.   Chief Complaint: Left Knee pain HPI: Bruce Romero continues with some significant inside pain in his knee.  He can't do things he would like to do including dancing.  He can play golf with some pain.  He notices that he is limping.  He does rest fairly well at night.  He received Visco supplementation at the last visit and says it didn't really help.  He has been through cortisone injections.  He takes Tylenol and does not take oral anti-inflammatories with fear of an ulcer.  He's already worked with Olivia Mackie in physical therapy.   MRI:  I reviewed an MRI scan films and report of a study done at [place] on 11/07/15.  This study shows a radial tear of the medial meniscus and also some degeneration in the medial compartment.  Past Medical History:  Diagnosis Date  . Acute medial meniscal tear, left, initial encounter   . Anxiety   . Diabetes mellitus   . GERD (gastroesophageal reflux disease)   . Hyperlipidemia   . Hypertension   . Irritable bowel   . Spastic colon    anaphylaxis due to [place]    Past Surgical History:  Procedure Laterality Date  . CHOLECYSTECTOMY    . NASAL SINUS SURGERY      Family History  Problem Relation Age of Onset  . Stroke Mother   . Pulmonary embolism Father    Social History:  reports that he has quit smoking. He has never used smokeless tobacco. He reports that he drinks alcohol. He reports that he does not use drugs.  Allergies:  Allergies  Allergen Reactions  . Cephalexin Anaphylaxis    REACTION: pt is not allergic to penicillin  . Flexeril [Cyclobenzaprine] Swelling    No prescriptions prior to admission.    No results found for this or any previous visit (from the past 48 hour(s)). No results found.  Review of Systems  Musculoskeletal: Positive for joint pain.       Left knee  All other systems reviewed and are negative.   Height 5\' 10"  (1.778 m), weight 97.5 kg (215 lb). Physical Exam   Constitutional: He is oriented to person, place, and time. He appears well-developed and well-nourished.  HENT:  Head: Normocephalic and atraumatic.  Eyes: Pupils are equal, round, and reactive to light.  Neck: Normal range of motion.  Cardiovascular: Normal rate and regular rhythm.   Respiratory: Effort normal.  GI: Soft.  Musculoskeletal:  Left knee is tender along the medial joint line.  His motion is good at 0-130 I do not appreciate an effusion.  McMurray's test causes pain in the medial direction.  I don't feel much crepitation.   Hip motion is full and pain free and SLR is negative on both sides.  There is no palpable LAD behind either knee.  Sensation and motor function are intact on both sides and there are palpable pulses on both sides.    Neurological: He is alert and oriented to person, place, and time.  Skin: Skin is warm and dry.  Psychiatric: He has a normal mood and affect. His behavior is normal. Judgment and thought content normal.     Assessment/Plan Assessment: Left knee torn medial meniscus  Plan: I think Cleven has come to the point where he should consider a knee arthroscopy.  He has pain which limits his ability to remain active and to dance which is something he likes to do.  He has been through injections of cortisone and Visco supplementation.  He's been through supervised physical therapy.  By MRI scan he does have a mixture of torn and worn cartilage and I've gone over how it's sometimes hard to tell which is causing knee pain. I reviewed risks of anesthesia and infection as well as potential for DVT related to a knee arthroscopy.    Mckaela Howley, Larwance Sachs, PA-C 01/13/2016, 1:20 PM

## 2016-01-13 NOTE — Telephone Encounter (Signed)
Rx faxed. LB 

## 2016-01-15 ENCOUNTER — Encounter (HOSPITAL_BASED_OUTPATIENT_CLINIC_OR_DEPARTMENT_OTHER)
Admission: RE | Disposition: A | Discharge status: Home / Self Care | Payer: Self-pay | Source: Ambulatory Visit | Attending: Orthopaedic Surgery

## 2016-01-15 ENCOUNTER — Ambulatory Visit (HOSPITAL_BASED_OUTPATIENT_CLINIC_OR_DEPARTMENT_OTHER)
Admission: RE | Admit: 2016-01-15 | Discharge: 2016-01-15 | Disposition: A | Discharge status: Home / Self Care | Payer: Medicare Other | Source: Ambulatory Visit | Attending: Orthopaedic Surgery | Admitting: Orthopaedic Surgery

## 2016-01-15 ENCOUNTER — Encounter (HOSPITAL_BASED_OUTPATIENT_CLINIC_OR_DEPARTMENT_OTHER): Payer: Self-pay

## 2016-01-15 ENCOUNTER — Ambulatory Visit (HOSPITAL_BASED_OUTPATIENT_CLINIC_OR_DEPARTMENT_OTHER): Payer: Medicare Other | Admitting: Anesthesiology

## 2016-01-15 DIAGNOSIS — Z79899 Other long term (current) drug therapy: Secondary | ICD-10-CM | POA: Insufficient documentation

## 2016-01-15 DIAGNOSIS — K589 Irritable bowel syndrome without diarrhea: Secondary | ICD-10-CM | POA: Insufficient documentation

## 2016-01-15 DIAGNOSIS — Z87891 Personal history of nicotine dependence: Secondary | ICD-10-CM | POA: Insufficient documentation

## 2016-01-15 DIAGNOSIS — M94262 Chondromalacia, left knee: Secondary | ICD-10-CM | POA: Diagnosis not present

## 2016-01-15 DIAGNOSIS — I10 Essential (primary) hypertension: Secondary | ICD-10-CM | POA: Insufficient documentation

## 2016-01-15 DIAGNOSIS — E785 Hyperlipidemia, unspecified: Secondary | ICD-10-CM | POA: Insufficient documentation

## 2016-01-15 DIAGNOSIS — F419 Anxiety disorder, unspecified: Secondary | ICD-10-CM | POA: Insufficient documentation

## 2016-01-15 DIAGNOSIS — K219 Gastro-esophageal reflux disease without esophagitis: Secondary | ICD-10-CM | POA: Insufficient documentation

## 2016-01-15 DIAGNOSIS — X58XXXA Exposure to other specified factors, initial encounter: Secondary | ICD-10-CM | POA: Insufficient documentation

## 2016-01-15 DIAGNOSIS — E119 Type 2 diabetes mellitus without complications: Secondary | ICD-10-CM | POA: Diagnosis not present

## 2016-01-15 DIAGNOSIS — M1712 Unilateral primary osteoarthritis, left knee: Secondary | ICD-10-CM | POA: Diagnosis not present

## 2016-01-15 DIAGNOSIS — Z7984 Long term (current) use of oral hypoglycemic drugs: Secondary | ICD-10-CM | POA: Diagnosis not present

## 2016-01-15 DIAGNOSIS — S83242A Other tear of medial meniscus, current injury, left knee, initial encounter: Secondary | ICD-10-CM | POA: Insufficient documentation

## 2016-01-15 DIAGNOSIS — Z7982 Long term (current) use of aspirin: Secondary | ICD-10-CM | POA: Insufficient documentation

## 2016-01-15 DIAGNOSIS — M23332 Other meniscus derangements, other medial meniscus, left knee: Secondary | ICD-10-CM | POA: Diagnosis not present

## 2016-01-15 HISTORY — DX: Gastro-esophageal reflux disease without esophagitis: K21.9

## 2016-01-15 HISTORY — PX: KNEE ARTHROSCOPY WITH MEDIAL MENISECTOMY: SHX5651

## 2016-01-15 HISTORY — DX: Other tear of medial meniscus, current injury, left knee, initial encounter: S83.242A

## 2016-01-15 HISTORY — DX: Anxiety disorder, unspecified: F41.9

## 2016-01-15 HISTORY — PX: CHONDROPLASTY: SHX5177

## 2016-01-15 HISTORY — DX: Irritable bowel syndrome, unspecified: K58.9

## 2016-01-15 LAB — GLUCOSE, CAPILLARY
GLUCOSE-CAPILLARY: 159 mg/dL — AB (ref 65–99)
Glucose-Capillary: 150 mg/dL — ABNORMAL HIGH (ref 65–99)

## 2016-01-15 SURGERY — ARTHROSCOPY, KNEE, WITH MEDIAL MENISCECTOMY
Anesthesia: General | Site: Knee | Laterality: Left

## 2016-01-15 MED ORDER — METHYLPREDNISOLONE ACETATE 80 MG/ML IJ SUSP
INTRAMUSCULAR | Status: AC
  Filled 2016-01-15: qty 1

## 2016-01-15 MED ORDER — PHENYLEPHRINE 40 MCG/ML (10ML) SYRINGE FOR IV PUSH (FOR BLOOD PRESSURE SUPPORT)
PREFILLED_SYRINGE | INTRAVENOUS | Status: AC
  Filled 2016-01-15: qty 10

## 2016-01-15 MED ORDER — MORPHINE SULFATE (PF) 4 MG/ML IV SOLN
INTRAVENOUS | Status: DC | PRN
  Administered 2016-01-15: 4 mg

## 2016-01-15 MED ORDER — OXYCODONE HCL 5 MG/5ML PO SOLN: 5.0000 mg | Freq: Once | ORAL | Status: DC | PRN

## 2016-01-15 MED ORDER — ONDANSETRON HCL 4 MG/2ML IJ SOLN: 4.0000 mg | Freq: Once | INTRAMUSCULAR | Status: DC | PRN

## 2016-01-15 MED ORDER — VANCOMYCIN HCL IN DEXTROSE 1-5 GM/200ML-% IV SOLN
1000.0000 mg | INTRAVENOUS | Status: AC
  Administered 2016-01-15: 1000 mg via INTRAVENOUS

## 2016-01-15 MED ORDER — EPHEDRINE 5 MG/ML INJ
INTRAVENOUS | Status: AC
Start: 2016-01-15 — End: 2016-01-15
  Filled 2016-01-15: qty 10

## 2016-01-15 MED ORDER — HYDROCODONE-ACETAMINOPHEN 5-325 MG PO TABS: 1.0000 | ORAL_TABLET | ORAL | 0 refills | Status: DC | PRN

## 2016-01-15 MED ORDER — METHYLPREDNISOLONE ACETATE 80 MG/ML IJ SUSP
INTRAMUSCULAR | Status: DC | PRN
  Administered 2016-01-15: 80 mg via INTRA_ARTICULAR

## 2016-01-15 MED ORDER — BUPIVACAINE-EPINEPHRINE (PF) 0.25% -1:200000 IJ SOLN
INTRAMUSCULAR | Status: DC | PRN
  Administered 2016-01-15: 10 mL

## 2016-01-15 MED ORDER — ONDANSETRON HCL 4 MG/2ML IJ SOLN
INTRAMUSCULAR | Status: DC | PRN
  Administered 2016-01-15: 4 mg via INTRAVENOUS

## 2016-01-15 MED ORDER — OXYCODONE HCL 5 MG PO TABS: 5.0000 mg | ORAL_TABLET | Freq: Once | ORAL | Status: DC | PRN

## 2016-01-15 MED ORDER — PROPOFOL 10 MG/ML IV BOLUS
INTRAVENOUS | Status: DC | PRN
  Administered 2016-01-15: 150 mg via INTRAVENOUS

## 2016-01-15 MED ORDER — FENTANYL CITRATE (PF) 100 MCG/2ML IJ SOLN: 25.0000 ug | INTRAMUSCULAR | Status: DC | PRN

## 2016-01-15 MED ORDER — DEXAMETHASONE SODIUM PHOSPHATE 10 MG/ML IJ SOLN
INTRAMUSCULAR | Status: AC
  Filled 2016-01-15: qty 1

## 2016-01-15 MED ORDER — VANCOMYCIN HCL IN DEXTROSE 1-5 GM/200ML-% IV SOLN
INTRAVENOUS | Status: AC
  Filled 2016-01-15: qty 200

## 2016-01-15 MED ORDER — FENTANYL CITRATE (PF) 100 MCG/2ML IJ SOLN
50.0000 ug | INTRAMUSCULAR | Status: DC | PRN
  Administered 2016-01-15 (×2): 50 ug via INTRAVENOUS

## 2016-01-15 MED ORDER — MIDAZOLAM HCL 2 MG/2ML IJ SOLN
1.0000 mg | INTRAMUSCULAR | Status: DC | PRN
  Administered 2016-01-15: 1 mg via INTRAVENOUS

## 2016-01-15 MED ORDER — LACTATED RINGERS IV SOLN
INTRAVENOUS | Status: DC
  Administered 2016-01-15: 12:00:00 via INTRAVENOUS

## 2016-01-15 MED ORDER — METHYLPREDNISOLONE ACETATE 40 MG/ML IJ SUSP
INTRAMUSCULAR | Status: AC
  Filled 2016-01-15: qty 1

## 2016-01-15 MED ORDER — LIDOCAINE 2% (20 MG/ML) 5 ML SYRINGE
INTRAMUSCULAR | Status: AC
  Filled 2016-01-15: qty 5

## 2016-01-15 MED ORDER — MIDAZOLAM HCL 2 MG/2ML IJ SOLN
INTRAMUSCULAR | Status: AC
  Filled 2016-01-15: qty 2

## 2016-01-15 MED ORDER — ROCURONIUM BROMIDE 10 MG/ML (PF) SYRINGE
PREFILLED_SYRINGE | INTRAVENOUS | Status: AC
  Filled 2016-01-15: qty 10

## 2016-01-15 MED ORDER — DEXAMETHASONE SODIUM PHOSPHATE 4 MG/ML IJ SOLN
INTRAMUSCULAR | Status: DC | PRN
  Administered 2016-01-15: 10 mg via INTRAVENOUS

## 2016-01-15 MED ORDER — HYDROMORPHONE HCL 1 MG/ML IJ SOLN: 0.2500 mg | INTRAMUSCULAR | Status: DC | PRN

## 2016-01-15 MED ORDER — CHLORHEXIDINE GLUCONATE 4 % EX LIQD: 60.0000 mL | Freq: Once | CUTANEOUS | Status: DC

## 2016-01-15 MED ORDER — BUPIVACAINE-EPINEPHRINE (PF) 0.25% -1:200000 IJ SOLN
INTRAMUSCULAR | Status: AC
  Filled 2016-01-15: qty 30

## 2016-01-15 MED ORDER — MORPHINE SULFATE (PF) 4 MG/ML IV SOLN
INTRAVENOUS | Status: AC
  Filled 2016-01-15: qty 1

## 2016-01-15 MED ORDER — SODIUM CHLORIDE 0.9 % IR SOLN
Status: DC | PRN
  Administered 2016-01-15: 3000 mL

## 2016-01-15 MED ORDER — SUCCINYLCHOLINE CHLORIDE 200 MG/10ML IV SOSY
PREFILLED_SYRINGE | INTRAVENOUS | Status: AC
  Filled 2016-01-15: qty 10

## 2016-01-15 MED ORDER — FENTANYL CITRATE (PF) 100 MCG/2ML IJ SOLN
INTRAMUSCULAR | Status: AC
  Filled 2016-01-15: qty 2

## 2016-01-15 MED ORDER — LIDOCAINE HCL (CARDIAC) 20 MG/ML IV SOLN
INTRAVENOUS | Status: DC | PRN
  Administered 2016-01-15: 30 mg via INTRAVENOUS

## 2016-01-15 MED ORDER — ONDANSETRON HCL 4 MG/2ML IJ SOLN
INTRAMUSCULAR | Status: AC
  Filled 2016-01-15: qty 2

## 2016-01-15 MED ORDER — BUPIVACAINE HCL (PF) 0.5 % IJ SOLN
INTRAMUSCULAR | Status: AC
  Filled 2016-01-15: qty 30

## 2016-01-15 MED ORDER — SCOPOLAMINE 1 MG/3DAYS TD PT72: 1.0000 | MEDICATED_PATCH | Freq: Once | TRANSDERMAL | Status: DC | PRN

## 2016-01-15 SURGICAL SUPPLY — 42 items
BANDAGE ACE 6X5 VEL STRL LF (GAUZE/BANDAGES/DRESSINGS) ×4 IMPLANT
BLADE CUDA 5.5 (BLADE) IMPLANT
BLADE CUTTER GATOR 3.5 (BLADE) ×4 IMPLANT
BLADE GREAT WHITE 4.2 (BLADE) ×2 IMPLANT
BLADE GREAT WHITE 4.2MM (BLADE) ×1
BNDG COHESIVE 6X5 TAN STRL LF (GAUZE/BANDAGES/DRESSINGS) ×3 IMPLANT
BNDG GAUZE ELAST 4 BULKY (GAUZE/BANDAGES/DRESSINGS) ×4 IMPLANT
DRAPE ARTHROSCOPY W/POUCH 114 (DRAPES) ×4 IMPLANT
DRAPE IMP U-DRAPE 54X76 (DRAPES) ×3 IMPLANT
DRAPE U-SHAPE 47X51 STRL (DRAPES) ×4 IMPLANT
DRSG EMULSION OIL 3X3 NADH (GAUZE/BANDAGES/DRESSINGS) ×4 IMPLANT
DURAPREP 26ML APPLICATOR (WOUND CARE) ×8 IMPLANT
ELECT MENISCUS 165MM 90D (ELECTRODE) IMPLANT
ELECT REM PT RETURN 9FT ADLT (ELECTROSURGICAL)
ELECTRODE REM PT RTRN 9FT ADLT (ELECTROSURGICAL) IMPLANT
GAUZE SPONGE 4X4 12PLY STRL (GAUZE/BANDAGES/DRESSINGS) ×4 IMPLANT
GLOVE BIO SURGEON STRL SZ8 (GLOVE) ×8 IMPLANT
GLOVE BIOGEL PI IND STRL 7.0 (GLOVE) ×2 IMPLANT
GLOVE BIOGEL PI IND STRL 8 (GLOVE) ×4 IMPLANT
GLOVE BIOGEL PI INDICATOR 7.0 (GLOVE) ×4
GLOVE BIOGEL PI INDICATOR 8 (GLOVE) ×4
GLOVE ECLIPSE 6.5 STRL STRAW (GLOVE) ×3 IMPLANT
GLOVE SS BIOGEL STRL SZ 8 (GLOVE) ×2 IMPLANT
GLOVE SUPERSENSE BIOGEL SZ 8 (GLOVE) ×4
GOWN STRL REUS W/ TWL LRG LVL3 (GOWN DISPOSABLE) ×2 IMPLANT
GOWN STRL REUS W/ TWL XL LVL3 (GOWN DISPOSABLE) ×4 IMPLANT
GOWN STRL REUS W/TWL LRG LVL3 (GOWN DISPOSABLE) ×4
GOWN STRL REUS W/TWL XL LVL3 (GOWN DISPOSABLE) ×8
IV NS IRRIG 3000ML ARTHROMATIC (IV SOLUTION) ×6 IMPLANT
KNEE WRAP E Z 3 GEL PACK (MISCELLANEOUS) ×4 IMPLANT
MANIFOLD NEPTUNE II (INSTRUMENTS) ×3 IMPLANT
PACK ARTHROSCOPY DSU (CUSTOM PROCEDURE TRAY) ×4 IMPLANT
PACK BASIN DAY SURGERY FS (CUSTOM PROCEDURE TRAY) ×4 IMPLANT
PENCIL BUTTON HOLSTER BLD 10FT (ELECTRODE) IMPLANT
SET ARTHROSCOPY TUBING (MISCELLANEOUS) ×4
SET ARTHROSCOPY TUBING LN (MISCELLANEOUS) ×2 IMPLANT
SHEET MEDIUM DRAPE 40X70 STRL (DRAPES) ×4 IMPLANT
STOCKINETTE IMPERVIOUS LG (DRAPES) ×3 IMPLANT
SYR 3ML 18GX1 1/2 (SYRINGE) IMPLANT
TOWEL OR 17X24 6PK STRL BLUE (TOWEL DISPOSABLE) ×4 IMPLANT
TOWEL OR NON WOVEN STRL DISP B (DISPOSABLE) ×4 IMPLANT
WATER STERILE IRR 1000ML POUR (IV SOLUTION) ×4 IMPLANT

## 2016-01-15 NOTE — Telephone Encounter (Signed)
Refill x1 

## 2016-01-15 NOTE — Anesthesia Preprocedure Evaluation (Signed)
Anesthesia Evaluation  Patient identified by MRN, date of birth, ID band Patient awake    Reviewed: Allergy & Precautions, NPO status , Patient's Chart, lab work & pertinent test results  Airway Mallampati: II  TM Distance: >3 FB Neck ROM: Full    Dental  (+) Teeth Intact, Dental Advisory Given   Pulmonary former smoker,    breath sounds clear to auscultation       Cardiovascular hypertension,  Rhythm:Regular     Neuro/Psych    GI/Hepatic   Endo/Other  diabetes  Renal/GU      Musculoskeletal   Abdominal   Peds  Hematology   Anesthesia Other Findings   Reproductive/Obstetrics                             Anesthesia Physical Anesthesia Plan  ASA: III  Anesthesia Plan: General   Post-op Pain Management:    Induction: Intravenous  Airway Management Planned: LMA  Additional Equipment:   Intra-op Plan:   Post-operative Plan: Extubation in OR  Informed Consent: I have reviewed the patients History and Physical, chart, labs and discussed the procedure including the risks, benefits and alternatives for the proposed anesthesia with the patient or authorized representative who has indicated his/her understanding and acceptance.   Dental advisory given  Plan Discussed with: CRNA and Anesthesiologist  Anesthesia Plan Comments:         Anesthesia Quick Evaluation

## 2016-01-15 NOTE — Anesthesia Postprocedure Evaluation (Signed)
Anesthesia Post Note  Patient: Bruce Romero  Procedure(s) Performed: Procedure(s) (LRB): ARTHROSCOPY LEFT KNEE WITH PARTIAL MEDIAL MENISECTOMY (Left) CHONDROPLASTY (Left)  Patient location during evaluation: PACU Anesthesia Type: General Level of consciousness: awake, awake and alert and oriented Pain management: pain level controlled Vital Signs Assessment: post-procedure vital signs reviewed and stable Respiratory status: spontaneous breathing, respiratory function stable and nonlabored ventilation Cardiovascular status: blood pressure returned to baseline Anesthetic complications: no    Last Vitals:  Vitals:   01/15/16 1454 01/15/16 1517  BP:  134/74  Pulse: 80 83  Resp: 12 16  Temp:  36.4 C    Last Pain:  Vitals:   01/15/16 1517  TempSrc: Oral  PainSc: 2                  Clemon Devaul COKER

## 2016-01-15 NOTE — Discharge Instructions (Signed)

## 2016-01-15 NOTE — Interval H&P Note (Signed)
History and Physical Interval Note:  01/15/2016 1:05 PM  Bruce Romero  has presented today for surgery, with the diagnosis of LEFT KNEE MEDIAL MENISCAL TEAR AND CHONDROMALACIA  The various methods of treatment have been discussed with the patient and family. After consideration of risks, benefits and other options for treatment, the patient has consented to  Procedure(s): ARTHROSCOPY KNEE (Left) as a surgical intervention .  The patient's history has been reviewed, patient examined, no change in status, stable for surgery.  I have reviewed the patient's chart and labs.  Questions were answered to the patient's satisfaction.     Sha Burling G

## 2016-01-15 NOTE — Op Note (Signed)
#  [id number] 

## 2016-01-15 NOTE — Anesthesia Procedure Notes (Signed)
Procedure Name: LMA Insertion Date/Time: 01/15/2016 1:25 PM Performed by: Melynda Ripple D Pre-anesthesia Checklist: Patient identified, Emergency Drugs available, Suction available and Patient being monitored Patient Re-evaluated:Patient Re-evaluated prior to inductionOxygen Delivery Method: Circle system utilized Preoxygenation: Pre-oxygenation with 100% oxygen Intubation Type: IV induction Ventilation: Mask ventilation without difficulty LMA: LMA inserted LMA Size: 4.0 Number of attempts: 1 Airway Equipment and Method: Bite block Placement Confirmation: positive ETCO2 Tube secured with: Tape Dental Injury: Teeth and Oropharynx as per pre-operative assessment

## 2016-01-15 NOTE — Transfer of Care (Signed)
Immediate Anesthesia Transfer of Care Note  Patient: Bruce Romero  Procedure(s) Performed: Procedure(s): ARTHROSCOPY LEFT KNEE WITH PARTIAL MEDIAL MENISECTOMY (Left) CHONDROPLASTY (Left)  Patient Location: PACU  Anesthesia Type:General  Level of Consciousness: sedated  Airway & Oxygen Therapy: Patient Spontanous Breathing and Patient connected to face mask oxygen  Post-op Assessment: Report given to RN and Post -op Vital signs reviewed and stable  Post vital signs: Reviewed and stable  Last Vitals:  Vitals:   01/15/16 1118  BP: (!) 155/81  Pulse: 90  Resp: 18  Temp: 36.6 C    Last Pain:  Vitals:   01/15/16 1118  TempSrc: Oral         Complications: No apparent anesthesia complications

## 2016-01-18 ENCOUNTER — Encounter: Payer: Self-pay | Admitting: Family Medicine

## 2016-01-18 ENCOUNTER — Encounter (HOSPITAL_BASED_OUTPATIENT_CLINIC_OR_DEPARTMENT_OTHER): Payer: Self-pay | Admitting: Orthopaedic Surgery

## 2016-01-18 NOTE — Telephone Encounter (Signed)
Patient is calling to follow up on this. Please advise.  °

## 2016-01-18 NOTE — Telephone Encounter (Signed)
Patient is calling regarding getting prescribed a sliding scale insulin pen. He has some questions and would like a call back. Please advise  Phone: [phone]

## 2016-01-18 NOTE — Op Note (Signed)
NAME:  Bruce Romero, Bruce Romero                   ACCOUNT NO.:  MEDICAL RECORD NO.:  [id number]  LOCATION:                                 FACILITY:  PHYSICIAN:  Monico Blitz. Kaimen Peine, M.D.DATE OF BIRTH:  [birth year]  DATE OF PROCEDURE:  01/15/2016 DATE OF DISCHARGE:                              OPERATIVE REPORT   PREOPERATIVE DIAGNOSES: 1. Left knee torn medial meniscus. 2. Left knee degenerative joint disease.  POSTOPERATIVE DIAGNOSES: 1. Left knee torn medial meniscus. 2. Left knee degenerative joint disease.  PROCEDURE: 1. Left knee partial medial meniscectomy. 2. Left knee abrasion chondroplasty, medial and patellofemoral.  ANESTHESIA:  Knee block and general.  ATTENDING SURGEON:  Monico Blitz. Rhona Raider, M.D.  ASSISTANT:  Loni Dolly, PA.  INDICATION FOR PROCEDURE:  The patient is a 70 year old man with a long history of left knee pain.  This has persisted despite various injections and braces and pills.  By MRI scan, he has a mixture of torn and worn cartilage.  He is not quite ready for knee replacement and is offered a knee arthroscopy.  Informed operative consent was obtained after discussion of possible complications including reaction to anesthesia and infection.  SUMMARY OF FINDINGS AND PROCEDURE:  Under general anesthesia and a block, a left knee arthroscopy was performed.  The predominant finding was degenerative change.  He had delamination of the articular cartilage on the medial femoral condyle over the area of the size of a quarter. This gradually peeled off the femur bone and was removed exposing bare bone beneath.  He also did have a medial meniscus tear radial in nature, addressed with about 20% partial medial meniscectomy.  The lateral compartment was benign with no evidence of meniscal articular cartilage injury, and the ACL was intact.  He had some moderate and focally severe change in patellofemoral as well where abrasion chondroplasty was performed through the  intertrochlear groove down to bleeding bone.  He was discharged home the same day.  DESCRIPTION OF PROCEDURE:  The patient was taken to the operating suite where general anesthetic was applied without difficulty.  He was positioned supine and prepped and draped in normal sterile fashion.  He was given a block in the pre-anesthesia area.  After the administration of IV vancomycin and an appropriate time out, an arthroscopy of the left knee was performed through a total of 2 portals.  Findings were as noted above.  Procedure consisted of abrasion chondroplasty, medial and patellofemoral with the removal of the large delaminated portion of articular cartilage as described above.  We also performed a partial medial meniscectomy with basket and shaver involving the posterior horn of the medial meniscus.  The knee was thoroughly irrigated, followed by placement of Marcaine with epinephrine and morphine plus some Depo- Medrol.  Adaptic was applied to the portals followed by dry gauze and loose Ace wrap.  ESTIMATED BLOOD LOSS AND FLUIDS:  Can be obtained from anesthesia records.  DISPOSITION:  The patient was extubated in the operating room and taken to the recovery room in stable addition.  He was to go home same-day and follow up in office closely.  I will contact him by phone tonight.  Monico Blitz Rhona Raider, M.D.     PGD/MEDQ  D:  01/15/2016  T:  01/16/2016  Job:  [id number]

## 2016-01-19 ENCOUNTER — Other Ambulatory Visit: Payer: Self-pay | Admitting: Internal Medicine

## 2016-01-19 MED ORDER — ALPRAZOLAM 0.25 MG PO TABS: ORAL_TABLET | ORAL | 0 refills | Status: DC

## 2016-01-19 MED ORDER — INSULIN ASPART 100 UNIT/ML CARTRIDGE (PENFILL): SUBCUTANEOUS | 1 refills | Status: DC

## 2016-01-19 MED ORDER — INSULIN PEN NEEDLE 32G X 6 MM MISC: 3 refills | Status: AC

## 2016-01-19 NOTE — Telephone Encounter (Signed)
novolog reg insulin pen #1   Sliding scale--- 200-250 mg 2 u  251-300 4 u  301-350 6u  351-400--8 u >400 10 u  And call dr   Should be temporary if they con't to run high we will adjust meds before March

## 2016-01-19 NOTE — Telephone Encounter (Signed)
Patient is calling to follow up on yesterdays request for a sliding scale insulin pen. States his BS this morning fasting was 197. He is 4 days post op and needs some advice on the high BS. Plse adv.

## 2016-01-19 NOTE — Telephone Encounter (Signed)
Ok to refill for 1 year 

## 2016-01-19 NOTE — Telephone Encounter (Signed)
Rx printed and forward to the Provider for review and signature.

## 2016-01-20 ENCOUNTER — Telehealth: Payer: Self-pay

## 2016-01-20 ENCOUNTER — Other Ambulatory Visit: Payer: Self-pay

## 2016-01-20 MED ORDER — GLUCOSE BLOOD VI STRP: ORAL_STRIP | 3 refills | Status: DC

## 2016-01-20 NOTE — Telephone Encounter (Signed)
Follow up call made to patient. States his BS today have been 147 this am and 237 at 9 am states he rembered he did not take his medications this am. States the Sliding Scale he was given is as follows:  200-250=2U  251-300=4U 301-350=6U 351-400=8U

## 2016-01-20 NOTE — Telephone Encounter (Signed)
Much better 

## 2016-01-21 ENCOUNTER — Telehealth: Payer: Self-pay | Admitting: Family Medicine

## 2016-01-21 NOTE — Telephone Encounter (Signed)
Reached out to patient regarding scheduling medicare wellness appointment and stated he's in the middle of dinner, will follow up with patient again.

## 2016-01-22 DIAGNOSIS — Z9889 Other specified postprocedural states: Secondary | ICD-10-CM | POA: Diagnosis not present

## 2016-02-03 ENCOUNTER — Encounter: Payer: Self-pay | Admitting: Family Medicine

## 2016-02-11 ENCOUNTER — Other Ambulatory Visit: Payer: Self-pay | Admitting: Family Medicine

## 2016-02-11 MED ORDER — SITAGLIP PHOS-METFORMIN HCL ER 50-1000 MG PO TB24: 2.0000 | ORAL_TABLET | Freq: Every day | ORAL | 2 refills | Status: DC

## 2016-02-11 NOTE — Telephone Encounter (Signed)
Updated medication list and sent in new medication. Patient has been notified of change.

## 2016-02-11 NOTE — Telephone Encounter (Signed)
Change the janumet xr to 50/1000 ,  2 po qd  #60  2 refills

## 2016-02-12 DIAGNOSIS — M25562 Pain in left knee: Secondary | ICD-10-CM | POA: Diagnosis not present

## 2016-02-16 ENCOUNTER — Encounter: Payer: Self-pay | Admitting: Family Medicine

## 2016-02-16 ENCOUNTER — Ambulatory Visit (INDEPENDENT_AMBULATORY_CARE_PROVIDER_SITE_OTHER): Payer: Medicare Other | Admitting: Family Medicine

## 2016-02-16 VITALS — BP 130/68 | HR 98 | Temp 97.6°F | Resp 16 | Ht 70.5 in | Wt 210.8 lb

## 2016-02-16 DIAGNOSIS — E1139 Type 2 diabetes mellitus with other diabetic ophthalmic complication: Secondary | ICD-10-CM | POA: Diagnosis not present

## 2016-02-16 DIAGNOSIS — E1165 Type 2 diabetes mellitus with hyperglycemia: Secondary | ICD-10-CM

## 2016-02-16 DIAGNOSIS — IMO0002 Reserved for concepts with insufficient information to code with codable children: Secondary | ICD-10-CM

## 2016-02-16 MED ORDER — DULAGLUTIDE 0.75 MG/0.5ML ~~LOC~~ SOAJ: SUBCUTANEOUS | 3 refills | Status: DC

## 2016-02-16 MED ORDER — METFORMIN HCL ER 500 MG PO TB24: ORAL_TABLET | ORAL | 5 refills | Status: DC

## 2016-02-16 NOTE — Patient Instructions (Signed)
Carbohydrate Counting for Diabetes Mellitus, Adult Carbohydrate counting is a method for keeping track of how many carbohydrates you eat. Eating carbohydrates naturally increases the amount of sugar (glucose) in the blood. Counting how many carbohydrates you eat helps keep your blood glucose within normal limits, which helps you manage your diabetes (diabetes mellitus). It is important to know how many carbohydrates you can safely have in each meal. This is different for every person. A diet and nutrition specialist (registered dietitian) can help you make a meal plan and calculate how many carbohydrates you should have at each meal and snack. Carbohydrates are found in the following foods:  Grains, such as breads and cereals.  Dried beans and soy products.  Starchy vegetables, such as potatoes, peas, and corn.  Fruit and fruit juices.  Milk and yogurt.  Sweets and snack foods, such as cake, cookies, candy, chips, and soft drinks. How do I count carbohydrates? There are two ways to count carbohydrates in food. You can use either of the methods or a combination of both. Reading "Nutrition Facts" on packaged food  The "Nutrition Facts" list is included on the labels of almost all packaged foods and beverages in the [place] It includes:  The serving size.  Information about nutrients in each serving, including the grams (g) of carbohydrate per serving. To use the "Nutrition Facts":  Decide how many servings you will have.  Multiply the number of servings by the number of carbohydrates per serving.  The resulting number is the total amount of carbohydrates that you will be having. Learning standard serving sizes of other foods  When you eat foods containing carbohydrates that are not packaged or do not include "Nutrition Facts" on the label, you need to measure the servings in order to count the amount of carbohydrates:  Measure the foods that you will eat with a food scale or measuring  cup, if needed.  Decide how many standard-size servings you will eat.  Multiply the number of servings by 15. Most carbohydrate-rich foods have about 15 g of carbohydrates per serving.  For example, if you eat 8 oz (170 g) of strawberries, you will have eaten 2 servings and 30 g of carbohydrates (2 servings x 15 g = 30 g).  For foods that have more than one food mixed, such as soups and casseroles, you must count the carbohydrates in each food that is included. The following list contains standard serving sizes of common carbohydrate-rich foods. Each of these servings has about 15 g of carbohydrates:   hamburger bun or  English muffin.   oz (15 mL) syrup.   oz (14 g) jelly.  1 slice of bread.  1 six-inch tortilla.  3 oz (85 g) cooked rice or pasta.  4 oz (113 g) cooked dried beans.  4 oz (113 g) starchy vegetable, such as peas, corn, or potatoes.  4 oz (113 g) hot cereal.  4 oz (113 g) mashed potatoes or  of a large baked potato.  4 oz (113 g) canned or frozen fruit.  4 oz (120 mL) fruit juice.  4-6 crackers.  6 chicken nuggets.  6 oz (170 g) unsweetened dry cereal.  6 oz (170 g) plain fat-free yogurt or yogurt sweetened with artificial sweeteners.  8 oz (240 mL) milk.  8 oz (170 g) fresh fruit or one small piece of fruit.  24 oz (680 g) popped popcorn. Example of carbohydrate counting Sample meal  3 oz (85 g) chicken breast.  6 oz (  170 g) brown rice.  4 oz (113 g) corn.  8 oz (240 mL) milk.  8 oz (170 g) strawberries with sugar-free whipped topping. Carbohydrate calculation 1. Identify the foods that contain carbohydrates:  Rice.  Corn.  Milk.  Strawberries. 2. Calculate how many servings you have of each food:  2 servings rice.  1 serving corn.  1 serving milk.  1 serving strawberries. 3. Multiply each number of servings by 15 g:  2 servings rice x 15 g = 30 g.  1 serving corn x 15 g = 15 g.  1 serving milk x 15 g = 15  g.  1 serving strawberries x 15 g = 15 g. 4. Add together all of the amounts to find the total grams of carbohydrates eaten:  30 g + 15 g + 15 g + 15 g = 75 g of carbohydrates total. This information is not intended to replace advice given to you by your health care provider. Make sure you discuss any questions you have with your health care provider. Document Released: 01/24/2005 Document Revised: 08/14/2015 Document Reviewed: 07/08/2015 Elsevier Interactive Patient Education  2017 [employer]  

## 2016-02-16 NOTE — Progress Notes (Signed)
Patient ID: ZEALAND BOYETT, male    DOB: [birth year]  Age: 71 y.o. MRN: [id number]    Subjective:  Subjective  HPI ELYAS VILLAMOR presents for c/o high bld sugars.  bld sugars from home reviewed.  Review of Systems  Constitutional: Negative for appetite change, diaphoresis, fatigue and unexpected weight change.  Eyes: Negative for pain, redness and visual disturbance.  Respiratory: Negative for cough, chest tightness, shortness of breath and wheezing.   Cardiovascular: Negative for chest pain, palpitations and leg swelling.  Endocrine: Negative for cold intolerance, heat intolerance, polydipsia, polyphagia and polyuria.  Genitourinary: Negative for difficulty urinating, dysuria and frequency.  Neurological: Negative for dizziness, light-headedness, numbness and headaches.    History Past Medical History:  Diagnosis Date  . Acute medial meniscal tear, left, initial encounter   . Anxiety   . Diabetes mellitus   . GERD (gastroesophageal reflux disease)   . Hyperlipidemia   . Hypertension   . Irritable bowel   . Spastic colon    anaphylaxis due to [place]    He has a past surgical history that includes Cholecystectomy; Nasal sinus surgery; Knee arthroscopy with medial menisectomy (Left, 01/15/2016); and Chondroplasty (Left, 01/15/2016).   His family history includes Pulmonary embolism in his father; Stroke in his mother.He reports that he has quit smoking. He has never used smokeless tobacco. He reports that he drinks alcohol. He reports that he does not use drugs.  Current Outpatient Prescriptions on File Prior to Visit  Medication Sig Dispense Refill  . albuterol (PROAIR HFA) 108 (90 BASE) MCG/ACT inhaler Inhale 2 puffs into the lungs every 6 (six) hours as needed for wheezing. 1 Inhaler 2  . ALPRAZolam (XANAX) 0.25 MG tablet take 1 tablet by mouth three times a day if needed 30 tablet 0  . amLODipine (NORVASC) 10 MG tablet Take 1 tablet (10 mg total) by mouth daily. 90  tablet 1  . aspirin 81 MG tablet Take 81 mg by mouth daily.    Marland Kitchen atorvastatin (LIPITOR) 10 MG tablet Take 1 tablet (10 mg total) by mouth daily. 90 tablet 1  . Blood Glucose Monitoring Suppl (ONE TOUCH ULTRA 2) w/Device KIT See admin instructions.  0  . candesartan (ATACAND) 16 MG tablet Take 1 tablet (16 mg total) by mouth at bedtime. 90 tablet 1  . Cholecalciferol (VITAMIN D) 2000 UNITS tablet Take 2,000 Units by mouth daily.      Marland Kitchen co-enzyme Q-10 30 MG capsule Take 100 mg by mouth daily.    Marland Kitchen dicyclomine (BENTYL) 20 MG tablet take 1 tablet by mouth every 6 hours 60 tablet 1  . esomeprazole (NEXIUM) 40 MG capsule Take 20 mg by mouth daily.    . fluticasone (FLONASE) 50 MCG/ACT nasal spray Place into both nostrils daily.    . Glucosamine Sulfate 1000 MG TABS Take 1,000 tablets by mouth 2 (two) times daily.    Marland Kitchen glucose blood (ONE TOUCH ULTRA TEST) test strip Check blood sugar 2 times daily. Dx: E11.9 100 each 3  . HYDROcodone-acetaminophen (NORCO) 5-325 MG tablet Take 1-2 tablets by mouth every 4 (four) hours as needed for moderate pain. 30 tablet 0  . insulin aspart (NOVOLOG PENFILL) cartridge novolog reg insulin pen #1   Sliding scale--- 200-250 mg 2 u    251-300 4 u   301-350 6u    351-400--8 u  >400 10 u  And call dr 15 mL 1  . Insulin Pen Needle 32G X 6 MM MISC Use as  directed with insulin pen 30 each 3  . linaclotide (LINZESS) 290 MCG CAPS capsule Take 1 capsule (290 mcg total) by mouth daily before breakfast. 30 capsule 5  . MAGNESIUM GLYCINATE PLUS PO Take 400 tablets by mouth 2 (two) times daily.    . Melatonin 3 MG TABS Take 3 tablets by mouth as needed.    . niacin (SLO-NIACIN) 500 MG tablet Take 500 mg by mouth at bedtime.    . [DISCONTINUED] citalopram (CELEXA) 10 MG tablet Take 1 tablet (10 mg total) by mouth daily. 90 tablet 0   No current facility-administered medications on file prior to visit.      Objective:  Objective  Physical Exam  Constitutional: He is oriented to  person, place, and time. Vital signs are normal. He appears well-developed and well-nourished. He is sleeping.  HENT:  Head: Normocephalic and atraumatic.  Mouth/Throat: Oropharynx is clear and moist.  Eyes: EOM are normal. Pupils are equal, round, and reactive to light.  Neck: Normal range of motion. Neck supple. No thyromegaly present.  Cardiovascular: Normal rate and regular rhythm.   No murmur heard. Pulmonary/Chest: Effort normal and breath sounds normal. No respiratory distress. He has no wheezes. He has no rales. He exhibits no tenderness.  Musculoskeletal: He exhibits no edema or tenderness.  Neurological: He is alert and oriented to person, place, and time.  Skin: Skin is warm and dry.  Psychiatric: He has a normal mood and affect. His behavior is normal. Judgment and thought content normal.  Nursing note and vitals reviewed.  BP 130/68 (BP Location: Left Arm, Patient Position: Sitting, Cuff Size: Large)   Pulse 98   Temp 97.6 F (36.4 C) (Oral)   Resp 16   Ht 5' 10.5" (1.791 m)   Wt 210 lb 12.8 oz (95.6 kg)   SpO2 97%   BMI 29.82 kg/m  Wt Readings from Last 3 Encounters:  02/16/16 210 lb 12.8 oz (95.6 kg)  01/15/16 208 lb 1 oz (94.4 kg)  10/29/15 216 lb 6.4 oz (98.2 kg)     Lab Results  Component Value Date   WBC 8.7 09/28/2015   HGB 14.6 09/28/2015   HCT 42.3 09/28/2015   PLT 191.0 09/28/2015   GLUCOSE 149 (H) 01/07/2016   CHOL 120 01/07/2016   TRIG 108.0 01/07/2016   HDL 45.20 01/07/2016   LDLCALC 53 01/07/2016   ALT 30 01/07/2016   AST 43 (H) 01/07/2016   NA 135 01/07/2016   K 4.2 01/07/2016   CL 99 01/07/2016   CREATININE 1.03 01/07/2016   BUN 16 01/07/2016   CO2 27 01/07/2016   TSH 0.979 12/30/2010   PSA 0.71 09/16/2013   HGBA1C 6.8 (H) 01/07/2016   MICROALBUR 0.1 04/22/2014    No results found.   Assessment & Plan:  Plan  I have discontinued Mr. Mcleish glimepiride and SitaGLIPtin-MetFORMIN HCl. I am also having him start on Dulaglutide  and metFORMIN. Additionally, I am having him maintain his Vitamin D, esomeprazole, niacin, albuterol, co-enzyme Q-10, Melatonin, Glucosamine Sulfate, MAGNESIUM GLYCINATE PLUS PO, aspirin, ONE TOUCH ULTRA 2, atorvastatin, candesartan, amLODipine, linaclotide, dicyclomine, fluticasone, HYDROcodone-acetaminophen, ALPRAZolam, insulin aspart, Insulin Pen Needle, and glucose blood.  Meds ordered this encounter  Medications  . Dulaglutide (TRULICITY) 3.88 IL/5.7VJ SOPN    Sig: 0.75 mg [place] q week    Dispense:  4 pen    Refill:  3  . metFORMIN (GLUCOPHAGE XR) 500 MG 24 hr tablet    Sig: 2 po q pm  Dispense:  60 tablet    Refill:  5    Problem List Items Addressed This Visit    None    Visit Diagnoses    Uncontrolled type 2 diabetes mellitus with other ophthalmic complication, without long-term current use of insulin (HCC)    -  Primary   Relevant Medications   Dulaglutide (TRULICITY) 1.88 QL/7.3PV SOPN   metFORMIN (GLUCOPHAGE XR) 500 MG 24 hr tablet    d/c janumet and amaryl Will inc trulicity to 1.5 after next week Recheck labs 3 months  Follow-up: Return in about 3 months (around 05/16/2016) for hypertension, hyperlipidemia, diabetes II.  Ann Held, DO

## 2016-02-16 NOTE — Progress Notes (Signed)
Pre visit review using our clinic review tool, if applicable. No additional management support is needed unless otherwise documented below in the visit note. 

## 2016-02-23 ENCOUNTER — Other Ambulatory Visit: Payer: Self-pay | Admitting: Family Medicine

## 2016-02-24 ENCOUNTER — Encounter: Payer: Self-pay | Admitting: Family Medicine

## 2016-02-26 ENCOUNTER — Encounter: Payer: Self-pay | Admitting: Family Medicine

## 2016-02-26 ENCOUNTER — Ambulatory Visit (INDEPENDENT_AMBULATORY_CARE_PROVIDER_SITE_OTHER): Payer: Medicare Other | Admitting: Family Medicine

## 2016-02-26 ENCOUNTER — Other Ambulatory Visit: Payer: Self-pay | Admitting: Family Medicine

## 2016-02-26 VITALS — BP 128/68 | HR 90 | Temp 98.0°F | Resp 16 | Ht 70.5 in | Wt 208.4 lb

## 2016-02-26 DIAGNOSIS — S0502XA Injury of conjunctiva and corneal abrasion without foreign body, left eye, initial encounter: Secondary | ICD-10-CM | POA: Insufficient documentation

## 2016-02-26 DIAGNOSIS — E1165 Type 2 diabetes mellitus with hyperglycemia: Secondary | ICD-10-CM

## 2016-02-26 DIAGNOSIS — S0501XA Injury of conjunctiva and corneal abrasion without foreign body, right eye, initial encounter: Secondary | ICD-10-CM | POA: Insufficient documentation

## 2016-02-26 DIAGNOSIS — E1139 Type 2 diabetes mellitus with other diabetic ophthalmic complication: Secondary | ICD-10-CM

## 2016-02-26 DIAGNOSIS — IMO0002 Reserved for concepts with insufficient information to code with codable children: Secondary | ICD-10-CM

## 2016-02-26 MED ORDER — MOXIFLOXACIN HCL 0.5 % OP SOLN: 1.0000 [drp] | Freq: Three times a day (TID) | OPHTHALMIC | Status: DC

## 2016-02-26 MED ORDER — MOXIFLOXACIN HCL 0.5 % OP SOLN: 1.0000 [drp] | Freq: Three times a day (TID) | OPHTHALMIC | 0 refills | Status: DC

## 2016-02-26 MED ORDER — METFORMIN HCL ER 500 MG PO TB24: ORAL_TABLET | ORAL | 2 refills | Status: DC

## 2016-02-26 MED ORDER — DULAGLUTIDE 1.5 MG/0.5ML ~~LOC~~ SOAJ: SUBCUTANEOUS | 3 refills | Status: DC

## 2016-02-26 NOTE — Telephone Encounter (Signed)
Patient checking on the status of message mentioned below, please advise best # [phone]

## 2016-02-26 NOTE — Telephone Encounter (Signed)
Ok to send 90 days with 3 refills each

## 2016-02-26 NOTE — Assessment & Plan Note (Addendum)
Inc trulicity ro 1.5 mg-- pt will look at formulary to see if there is something cheaper to take

## 2016-02-26 NOTE — Patient Instructions (Signed)
Corneal Abrasion The cornea is the clear covering at the front and center of the eye. When looking at the colored portion of the eye (iris), you are looking through the cornea. This very thin tissue is made up of many layers. The surface layer is a single layer of cells (corneal epithelium) and is one of the most sensitive tissues in the body. If a scratch or injury causes the corneal epithelium to come off, it is called a corneal abrasion. If the injury extends to the tissues below the epithelium, the condition is called a corneal ulcer. CAUSES   Scratches.  Trauma.  Foreign body in the eye. Some people have recurrences of abrasions in the area of the original injury even after it has healed (recurrent erosion syndrome). Recurrent erosion syndrome generally improves and goes away with time. SYMPTOMS   Eye pain.  Difficulty or inability to keep the injured eye open.  The eye becomes very sensitive to light.  Recurrent erosions tend to happen suddenly, first thing in the morning, usually after waking up and opening the eye. DIAGNOSIS  Your health care provider can diagnose a corneal abrasion during an eye exam. Dye is usually placed in the eye using a drop or a small paper strip moistened by your tears. When the eye is examined with a special light, the abrasion shows up clearly because of the dye. TREATMENT   Small abrasions may be treated with antibiotic drops or ointment alone.  A pressure patch may be put over the eye. If this is done, follow your doctor's instructions for when to remove the patch. Do not drive or use machines while the eye patch is on. Judging distances is hard to do with a patch on. If the abrasion becomes infected and spreads to the deeper tissues of the cornea, a corneal ulcer can result. This is serious because it can cause corneal scarring. Corneal scars interfere with light passing through the cornea and cause a loss of vision in the involved eye. HOME CARE  INSTRUCTIONS  Use medicine or ointment as directed. Only take over-the-counter or prescription medicines for pain, discomfort, or fever as directed by your health care provider.  Do not drive or operate machinery if your eye is patched. Your ability to judge distances is impaired.  If your health care provider has given you a follow-up appointment, it is very important to keep that appointment. Not keeping the appointment could result in a severe eye infection or permanent loss of vision. If there is any problem keeping the appointment, let your health care provider know. SEEK MEDICAL CARE IF:   You have pain, light sensitivity, and a scratchy feeling in one eye or both eyes.  Your pressure patch keeps loosening up, and you can blink your eye under the patch after treatment.  Any kind of discharge develops from the eye after treatment or if the lids stick together in the morning.  You have the same symptoms in the morning as you did with the original abrasion days, weeks, or months after the abrasion healed. This information is not intended to replace advice given to you by your health care provider. Make sure you discuss any questions you have with your health care provider. Document Released: 01/22/2000 Document Revised: 10/15/2014 Document Reviewed: 10/01/2012 Elsevier Interactive Patient Education  2017 [employer]  

## 2016-02-26 NOTE — Addendum Note (Signed)
Addended by: Roma Schanz R on: 02/26/2016 03:41 PM   Modules accepted: Orders

## 2016-02-26 NOTE — Assessment & Plan Note (Signed)
Use drops -- if symptoms do not improve in next 2-3 days f/u opth

## 2016-02-26 NOTE — Progress Notes (Signed)
Patient ID: Bruce Romero, male    DOB: 21-Sep-1945  Age: 71 y.o. MRN: [id number]    Subjective:  Subjective  HPI Bruce Romero presents for eye irritation.  He was seen by eye dr and given systane, rephresh and bactroban ---- eye is still irritated.  He was told he had dry eye.  Pt also c/o runny nose and ears feel clogged.    Review of Systems  Constitutional: Negative for appetite change, diaphoresis, fatigue and unexpected weight change.  HENT: Positive for postnasal drip and rhinorrhea. Negative for congestion, ear pain, sinus pain, sinus pressure and sneezing.   Eyes: Positive for redness and itching. Negative for pain and visual disturbance.  Respiratory: Negative for cough, chest tightness, shortness of breath and wheezing.   Cardiovascular: Negative for chest pain, palpitations and leg swelling.  Endocrine: Negative for cold intolerance, heat intolerance, polydipsia, polyphagia and polyuria.  Genitourinary: Negative for difficulty urinating, dysuria and frequency.  Neurological: Negative for dizziness, light-headedness, numbness and headaches.    History Past Medical History:  Diagnosis Date  . Acute medial meniscal tear, left, initial encounter   . Anxiety   . Diabetes mellitus   . GERD (gastroesophageal reflux disease)   . Hyperlipidemia   . Hypertension   . Irritable bowel   . Spastic colon    anaphylaxis due to [place]    He has a past surgical history that includes Cholecystectomy; Nasal sinus surgery; Knee arthroscopy with medial menisectomy (Left, 01/15/2016); and Chondroplasty (Left, 01/15/2016).   His family history includes Pulmonary embolism in his father; Stroke in his mother.He reports that he has quit smoking. He has never used smokeless tobacco. He reports that he drinks alcohol. He reports that he does not use drugs.  Current Outpatient Prescriptions on File Prior to Visit  Medication Sig Dispense Refill  . albuterol (PROAIR HFA) 108 (90 BASE)  MCG/ACT inhaler Inhale 2 puffs into the lungs every 6 (six) hours as needed for wheezing. 1 Inhaler 2  . ALPRAZolam (XANAX) 0.25 MG tablet take 1 tablet by mouth three times a day if needed 30 tablet 0  . amLODipine (NORVASC) 10 MG tablet take 1 tablet by mouth once daily 90 tablet 1  . aspirin 81 MG tablet Take 81 mg by mouth daily.    Marland Kitchen atorvastatin (LIPITOR) 10 MG tablet Take 1 tablet (10 mg total) by mouth daily. 90 tablet 1  . Blood Glucose Monitoring Suppl (ONE TOUCH ULTRA 2) w/Device KIT See admin instructions.  0  . candesartan (ATACAND) 16 MG tablet Take 1 tablet (16 mg total) by mouth at bedtime. 90 tablet 1  . Cholecalciferol (VITAMIN D) 2000 UNITS tablet Take 2,000 Units by mouth daily.      Marland Kitchen co-enzyme Q-10 30 MG capsule Take 100 mg by mouth daily.    Marland Kitchen dicyclomine (BENTYL) 20 MG tablet take 1 tablet by mouth every 6 hours 60 tablet 1  . esomeprazole (NEXIUM) 40 MG capsule Take 20 mg by mouth daily.    . fluticasone (FLONASE) 50 MCG/ACT nasal spray Place into both nostrils daily.    . Glucosamine Sulfate 1000 MG TABS Take 1,000 tablets by mouth 2 (two) times daily.    Marland Kitchen glucose blood (ONE TOUCH ULTRA TEST) test strip Check blood sugar 2 times daily. Dx: E11.9 100 each 3  . HYDROcodone-acetaminophen (NORCO) 5-325 MG tablet Take 1-2 tablets by mouth every 4 (four) hours as needed for moderate pain. 30 tablet 0  . insulin aspart (NOVOLOG PENFILL)  cartridge novolog reg insulin pen #1   Sliding scale--- 200-250 mg 2 u    251-300 4 u   301-350 6u    351-400--8 u  >400 10 u  And call dr 15 mL 1  . Insulin Pen Needle 32G X 6 MM MISC Use as directed with insulin pen 30 each 3  . linaclotide (LINZESS) 290 MCG CAPS capsule Take 1 capsule (290 mcg total) by mouth daily before breakfast. 30 capsule 5  . MAGNESIUM GLYCINATE PLUS PO Take 400 tablets by mouth 2 (two) times daily.    . Melatonin 3 MG TABS Take 3 tablets by mouth as needed.    . metFORMIN (GLUCOPHAGE XR) 500 MG 24 hr tablet 2 po q pm  60 tablet 5  . niacin (SLO-NIACIN) 500 MG tablet Take 500 mg by mouth at bedtime.    . [DISCONTINUED] citalopram (CELEXA) 10 MG tablet Take 1 tablet (10 mg total) by mouth daily. 90 tablet 0   No current facility-administered medications on file prior to visit.      Objective:  Objective  Physical Exam  Constitutional: He is oriented to person, place, and time. Vital signs are normal. He appears well-developed and well-nourished. He is sleeping.  HENT:  Head: Normocephalic and atraumatic.  Mouth/Throat: Oropharynx is clear and moist.  Eyes: EOM are normal. Pupils are equal, round, and reactive to light. Right eye exhibits discharge. Right eye exhibits no chemosis, no exudate and no hordeolum. No foreign body present in the right eye. Left eye exhibits discharge. Left eye exhibits no chemosis, no exudate and no hordeolum. No foreign body present in the left eye. Right conjunctiva is injected. Right conjunctiva has no hemorrhage. Left conjunctiva is injected. Left conjunctiva has no hemorrhage. Right pupil is round and reactive. Left pupil is round and reactive.    Neck: Normal range of motion. Neck supple. No thyromegaly present.  Cardiovascular: Normal rate and regular rhythm.   No murmur heard. Pulmonary/Chest: Effort normal and breath sounds normal. No respiratory distress. He has no wheezes. He has no rales. He exhibits no tenderness.  Musculoskeletal: He exhibits no edema or tenderness.  Neurological: He is alert and oriented to person, place, and time.  Skin: Skin is warm and dry.  Psychiatric: He has a normal mood and affect. His behavior is normal. Judgment and thought content normal.  Nursing note and vitals reviewed.  BP 128/68 (BP Location: Left Arm, Patient Position: Sitting, Cuff Size: Large)   Pulse 90   Temp 98 F (36.7 C) (Oral)   Resp 16   Ht 5' 10.5" (1.791 m)   Wt 208 lb 6.4 oz (94.5 kg)   SpO2 97%   BMI 29.48 kg/m  Wt Readings from Last 3 Encounters:  02/26/16  208 lb 6.4 oz (94.5 kg)  02/16/16 210 lb 12.8 oz (95.6 kg)  01/15/16 208 lb 1 oz (94.4 kg)     Lab Results  Component Value Date   WBC 8.7 09/28/2015   HGB 14.6 09/28/2015   HCT 42.3 09/28/2015   PLT 191.0 09/28/2015   GLUCOSE 149 (H) 01/07/2016   CHOL 120 01/07/2016   TRIG 108.0 01/07/2016   HDL 45.20 01/07/2016   LDLCALC 53 01/07/2016   ALT 30 01/07/2016   AST 43 (H) 01/07/2016   NA 135 01/07/2016   K 4.2 01/07/2016   CL 99 01/07/2016   CREATININE 1.03 01/07/2016   BUN 16 01/07/2016   CO2 27 01/07/2016   TSH 0.979 12/30/2010  PSA 0.71 09/16/2013   HGBA1C 6.8 (H) 01/07/2016   MICROALBUR 0.1 04/22/2014    No results found.   Assessment & Plan:  Plan  I have discontinued Mr. Rathke Dulaglutide. I am also having him start on Dulaglutide. Additionally, I am having him maintain his Vitamin D, esomeprazole, niacin, albuterol, co-enzyme Q-10, Melatonin, Glucosamine Sulfate, MAGNESIUM GLYCINATE PLUS PO, aspirin, ONE TOUCH ULTRA 2, atorvastatin, candesartan, linaclotide, dicyclomine, fluticasone, HYDROcodone-acetaminophen, ALPRAZolam, insulin aspart, Insulin Pen Needle, glucose blood, metFORMIN, and amLODipine. We will continue to administer moxifloxacin.  Meds ordered this encounter  Medications  . moxifloxacin (VIGAMOX) 0.5 % ophthalmic solution 1 drop  . Dulaglutide (TRULICITY) 1.5 YC/1.4GY SOPN    Sig: 1.5 mg weekly    Dispense:  4 pen    Refill:  3    Problem List Items Addressed This Visit      Unprioritized   Corneal abrasion of both eyes - Primary    Use drops -- if symptoms do not improve in next 2-3 days f/u opth      Relevant Medications   moxifloxacin (VIGAMOX) 0.5 % ophthalmic solution 1 drop   Type II diabetes mellitus, uncontrolled ([place])    Inc trulicity ro 1.5 mg-- pt will look at formulary to see if there is something cheaper to take       Relevant Medications   Dulaglutide (TRULICITY) 1.5 JE/5.6DJ SOPN      Follow-up: No Follow-up on  file.  Ann Held, DO

## 2016-02-26 NOTE — Progress Notes (Signed)
Pre visit review using our clinic review tool, if applicable. No additional management support is needed unless otherwise documented below in the visit note. 

## 2016-03-15 ENCOUNTER — Encounter: Payer: Self-pay | Admitting: Family Medicine

## 2016-03-15 ENCOUNTER — Other Ambulatory Visit: Payer: Self-pay | Admitting: Family Medicine

## 2016-03-21 ENCOUNTER — Other Ambulatory Visit: Payer: Self-pay | Admitting: Family Medicine

## 2016-03-21 DIAGNOSIS — E1165 Type 2 diabetes mellitus with hyperglycemia: Principal | ICD-10-CM

## 2016-03-21 DIAGNOSIS — E1139 Type 2 diabetes mellitus with other diabetic ophthalmic complication: Secondary | ICD-10-CM

## 2016-03-21 DIAGNOSIS — IMO0002 Reserved for concepts with insufficient information to code with codable children: Secondary | ICD-10-CM

## 2016-03-21 MED ORDER — DULAGLUTIDE 1.5 MG/0.5ML ~~LOC~~ SOAJ: SUBCUTANEOUS | 3 refills | Status: DC

## 2016-03-21 MED ORDER — METFORMIN HCL ER 750 MG PO TB24: 750.0000 mg | ORAL_TABLET | Freq: Two times a day (BID) | ORAL | 2 refills | Status: DC

## 2016-04-01 ENCOUNTER — Encounter: Payer: Self-pay | Admitting: Family Medicine

## 2016-04-01 NOTE — Telephone Encounter (Signed)
Try to add farxiga 5 mg #30  1 po qd if his ins will cover Other option is to send him to endocrine

## 2016-04-04 ENCOUNTER — Other Ambulatory Visit: Payer: Self-pay | Admitting: Family Medicine

## 2016-04-04 NOTE — Telephone Encounter (Signed)
Refer to endo 

## 2016-04-05 ENCOUNTER — Other Ambulatory Visit: Payer: Self-pay | Admitting: Family Medicine

## 2016-04-05 DIAGNOSIS — E119 Type 2 diabetes mellitus without complications: Secondary | ICD-10-CM

## 2016-04-06 ENCOUNTER — Other Ambulatory Visit (INDEPENDENT_AMBULATORY_CARE_PROVIDER_SITE_OTHER): Payer: Medicare Other

## 2016-04-06 DIAGNOSIS — E7800 Pure hypercholesterolemia, unspecified: Secondary | ICD-10-CM

## 2016-04-06 DIAGNOSIS — R7309 Other abnormal glucose: Secondary | ICD-10-CM

## 2016-04-06 LAB — COMPREHENSIVE METABOLIC PANEL
ALK PHOS: 44 U/L (ref 39–117)
ALT: 27 U/L (ref 0–53)
AST: 32 U/L (ref 0–37)
Albumin: 4.3 g/dL (ref 3.5–5.2)
BILIRUBIN TOTAL: 0.4 mg/dL (ref 0.2–1.2)
BUN: 16 mg/dL (ref 6–23)
CO2: 28 meq/L (ref 19–32)
CREATININE: 0.94 mg/dL (ref 0.40–1.50)
Calcium: 9.2 mg/dL (ref 8.4–10.5)
Chloride: 97 mEq/L (ref 96–112)
GFR: 83.92 mL/min (ref >60.00)
GLUCOSE: 133 mg/dL — AB (ref 70–99)
Potassium: 3.9 mEq/L (ref 3.5–5.1)
SODIUM: 133 meq/L — AB (ref 135–145)
TOTAL PROTEIN: 7 g/dL (ref 6.0–8.3)

## 2016-04-06 LAB — LIPID PANEL
CHOL/HDL RATIO: 2
Cholesterol: 103 mg/dL (ref 0–200)
HDL: 43.8 mg/dL (ref >39.00)
LDL Cholesterol: 29 mg/dL (ref 0–99)
NONHDL: 58.89
Triglycerides: 149 mg/dL (ref 0.0–149.0)
VLDL: 29.8 mg/dL (ref 0.0–40.0)

## 2016-04-06 LAB — HEMOGLOBIN A1C: Hgb A1c MFr Bld: 7 % — ABNORMAL HIGH (ref 4.6–6.5)

## 2016-04-08 ENCOUNTER — Other Ambulatory Visit: Payer: Self-pay | Admitting: Family Medicine

## 2016-04-14 DIAGNOSIS — L578 Other skin changes due to chronic exposure to nonionizing radiation: Secondary | ICD-10-CM | POA: Diagnosis not present

## 2016-04-14 DIAGNOSIS — L570 Actinic keratosis: Secondary | ICD-10-CM | POA: Diagnosis not present

## 2016-04-26 ENCOUNTER — Encounter: Payer: Self-pay | Admitting: Endocrinology

## 2016-04-26 ENCOUNTER — Ambulatory Visit (INDEPENDENT_AMBULATORY_CARE_PROVIDER_SITE_OTHER): Payer: Medicare Other | Admitting: Endocrinology

## 2016-04-26 VITALS — BP 124/86 | HR 94 | Ht 70.5 in | Wt 205.0 lb

## 2016-04-26 DIAGNOSIS — E1165 Type 2 diabetes mellitus with hyperglycemia: Secondary | ICD-10-CM

## 2016-04-26 DIAGNOSIS — E1142 Type 2 diabetes mellitus with diabetic polyneuropathy: Secondary | ICD-10-CM | POA: Diagnosis not present

## 2016-04-26 DIAGNOSIS — IMO0002 Reserved for concepts with insufficient information to code with codable children: Secondary | ICD-10-CM

## 2016-04-26 NOTE — Patient Instructions (Addendum)
good diet and exercise significantly improve the control of your diabetes.  please let me know if you wish to be referred to a dietician.  high blood sugar is very risky to your health.  you should see an eye doctor and dentist every year.  It is very important to get all recommended vaccinations.  Controlling your blood pressure and cholesterol drastically reduces the damage diabetes does to your body.  Those who smoke should quit.  Please discuss these with your doctor.   check your blood sugar once a day.  vary the time of day when you check, between before the 3 meals, and at bedtime.  also check if you have symptoms of your blood sugar being too high or too low.  please keep a record of the readings and bring it to your next appointment here (or you can bring the meter itself).  You can write it on any piece of paper.  please call us sooner if your blood sugar goes below 70, or if you have a lot of readings over 200.  Please see a dietician specialist, here is our office.  Please continue the same medications for now.  Please come back for a follow-up appointment in 2 months.

## 2016-04-26 NOTE — Progress Notes (Signed)
Subjective:    Patient ID: Bruce Romero, male    DOB: July 19, 1945, 71 y.o.   MRN: [id number]  HPI pt is referred by Dr Mikle Bosworth, for diabetes.  Pt states DM was dx'ed in 2006; he has mild neuropathy of the lower extremities; he is unaware of any associated chronic complications; he has never been on insulin; pt says his diet and exercise are good; he has never had pancreatitis, pancreatic surgery, severe hypoglycemia or DKA.  he takes trulicity and metformin.  He says he cannot afford to add another name brand medication. He says cbg's are in the mid-100's.   Past Medical History:  Diagnosis Date  . Acute medial meniscal tear, left, initial encounter   . Anxiety   . Diabetes mellitus   . GERD (gastroesophageal reflux disease)   . Hyperlipidemia   . Hypertension   . Irritable bowel   . Spastic colon    anaphylaxis due to [place]    Past Surgical History:  Procedure Laterality Date  . CHOLECYSTECTOMY    . CHONDROPLASTY Left 01/15/2016   Procedure: CHONDROPLASTY;  Surgeon: Melrose Nakayama, MD;  Location: [facility];  Service: Orthopedics;  Laterality: Left;  . KNEE ARTHROSCOPY WITH MEDIAL MENISECTOMY Left 01/15/2016   Procedure: ARTHROSCOPY LEFT KNEE WITH PARTIAL MEDIAL MENISECTOMY;  Surgeon: Melrose Nakayama, MD;  Location: [facility];  Service: Orthopedics;  Laterality: Left;  . NASAL SINUS SURGERY      Social History   Social History  . Marital status: Married    Spouse name: N/A  . Number of children: N/A  . Years of education: N/A   Occupational History  . Not on file.   Social History Main Topics  . Smoking status: Former Research scientist (life sciences)  . Smokeless tobacco: Never Used  . Alcohol use 0.0 oz/week     Comment: social  . Drug use: No  . Sexual activity: Not on file   Other Topics Concern  . Not on file   Social History Narrative  . No narrative on file    Current Outpatient Prescriptions on File Prior to Visit  Medication Sig Dispense  Refill  . albuterol (PROAIR HFA) 108 (90 BASE) MCG/ACT inhaler Inhale 2 puffs into the lungs every 6 (six) hours as needed for wheezing. 1 Inhaler 2  . ALPRAZolam (XANAX) 0.25 MG tablet take 1 tablet by mouth three times a day if needed 30 tablet 0  . amLODipine (NORVASC) 10 MG tablet take 1 tablet by mouth once daily 90 tablet 1  . aspirin 81 MG tablet Take 81 mg by mouth daily.    Marland Kitchen atorvastatin (LIPITOR) 10 MG tablet Take 1 tablet (10 mg total) by mouth daily. 90 tablet 1  . Blood Glucose Monitoring Suppl (ONE TOUCH ULTRA 2) w/Device KIT See admin instructions.  0  . candesartan (ATACAND) 16 MG tablet Take 1 tablet (16 mg total) by mouth at bedtime. 90 tablet 1  . Cholecalciferol (VITAMIN D) 2000 UNITS tablet Take 2,000 Units by mouth daily.      Marland Kitchen co-enzyme Q-10 30 MG capsule Take 100 mg by mouth daily.    . Dulaglutide (TRULICITY) 1.5 OJ/5.0KX SOPN 1.5 mg weekly 4 pen 3  . esomeprazole (NEXIUM) 40 MG capsule Take 20 mg by mouth daily.    . fluticasone (FLONASE) 50 MCG/ACT nasal spray Place into both nostrils daily.    Marland Kitchen glucose blood (ONE TOUCH ULTRA TEST) test strip Check blood sugar 2 times daily. Dx: E11.9 100  each 3  . Insulin Pen Needle 32G X 6 MM MISC Use as directed with insulin pen 30 each 3  . linaclotide (LINZESS) 290 MCG CAPS capsule Take 1 capsule (290 mcg total) by mouth daily before breakfast. 30 capsule 5  . Melatonin 3 MG TABS Take 3 tablets by mouth as needed.    . metFORMIN (GLUCOPHAGE-XR) 750 MG 24 hr tablet Take 1 tablet (750 mg total) by mouth 2 (two) times daily. 60 tablet 2  . moxifloxacin (VIGAMOX) 0.5 % ophthalmic solution Place 1 drop into both eyes 3 (three) times daily. 3 mL 0  . niacin (SLO-NIACIN) 500 MG tablet Take 500 mg by mouth at bedtime.    . dapagliflozin propanediol (FARXIGA) 5 MG TABS tablet Take 5 mg by mouth daily.    Marland Kitchen dicyclomine (BENTYL) 20 MG tablet TAKE 1 TABLET BY MOUTH EVERY 6 HOURS (Patient not taking: Reported on 04/26/2016) 60 tablet 1  .  Glucosamine Sulfate 1000 MG TABS Take 1,000 tablets by mouth 2 (two) times daily.    Marland Kitchen MAGNESIUM GLYCINATE PLUS PO Take 400 tablets by mouth 2 (two) times daily.    . [DISCONTINUED] citalopram (CELEXA) 10 MG tablet Take 1 tablet (10 mg total) by mouth daily. 90 tablet 0   No current facility-administered medications on file prior to visit.     Allergies  Allergen Reactions  . Cephalexin Anaphylaxis    REACTION: pt is not allergic to penicillin  . Bactrim [Sulfamethoxazole-Trimethoprim] Other (See Comments)    ? Took Bactrim and Flexeril at same time had lip swelling unsure which caused reaction.  Yvette Rack [Cyclobenzaprine] Swelling    Family History  Problem Relation Age of Onset  . Stroke Mother   . Pulmonary embolism Father   . Diabetes Brother     BP 124/86   Pulse 94   Ht 5' 10.5" (1.791 m)   Wt 205 lb (93 kg)   SpO2 97%   BMI 29.00 kg/m    Review of Systems denies weight loss, blurry vision, headache, chest pain, sob, n/v, excessive diaphoresis, memory loss, and cold intolerance.   He has nocturia, rhinorrhea, easy bruising, and leg cramps.      Objective:   Physical Exam VS: see vs page GEN: no distress HEAD: head: no deformity eyes: no periorbital swelling, no proptosis.  external nose and ears are normal mouth: no lesion seen NECK: supple, thyroid is not enlarged CHEST WALL: no deformity LUNGS: clear to auscultation CV: reg rate and rhythm, no murmur ABD: abdomen is soft, nontender.  no hepatosplenomegaly.  not distended.  no hernia MUSCULOSKELETAL: muscle bulk and strength are grossly normal.  no obvious joint swelling.  gait is normal and steady EXTEMITIES: no deformity.  no ulcer on the feet.  feet are of normal color and temp.  Trace bilat leg edema.  PULSES: dorsalis pedis intact bilat.  no carotid bruit.  NEURO:  cn 2-12 grossly intact.   readily moves all 4's.  sensation is intact to touch on the feet.  SKIN:  Normal texture and temperature.  No  rash or suspicious lesion is visible.   NODES:  None palpable at the neck.  PSYCH: alert, well-oriented.  Does not appear anxious nor depressed.    Lab Results  Component Value Date   HGBA1C 7.0 (H) 04/06/2016   Lab Results  Component Value Date   CREATININE 0.94 04/06/2016   BUN 16 04/06/2016   NA 133 (L) 04/06/2016   K 3.9 04/06/2016  CL 97 04/06/2016   CO2 28 04/06/2016   Lab Results  Component Value Date   MICROALBUR 0.1 04/22/2014   I personally reviewed electrocardiogram tracing (01/12/16): Indication: preop Impression: NSR.  No MI.  No hypertrophy. Compared to 2016: sinus arrythmia has resolved     Assessment & Plan:  I advised him to add another oral med.  he declines to add even a generic med.  Edema: this limits rx options.    Patient is advised the following: Patient Instructions  good diet and exercise significantly improve the control of your diabetes.  please let me know if you wish to be referred to a dietician.  high blood sugar is very risky to your health.  you should see an eye doctor and dentist every year.  It is very important to get all recommended vaccinations.  Controlling your blood pressure and cholesterol drastically reduces the damage diabetes does to your body.  Those who smoke should quit.  Please discuss these with your doctor.   check your blood sugar once a day.  vary the time of day when you check, between before the 3 meals, and at bedtime.  also check if you have symptoms of your blood sugar being too high or too low.  please keep a record of the readings and bring it to your next appointment here (or you can bring the meter itself).  You can write it on any piece of paper.  please call us sooner if your blood sugar goes below 70, or if you have a lot of readings over 200.  Please see a dietician specialist, here is our office.  Please continue the same medications for now.  Please come back for a follow-up appointment in 2 months.

## 2016-04-29 ENCOUNTER — Encounter: Payer: Medicare Other | Attending: Endocrinology | Admitting: Dietician

## 2016-04-29 ENCOUNTER — Encounter: Payer: Self-pay | Admitting: Dietician

## 2016-04-29 DIAGNOSIS — E1142 Type 2 diabetes mellitus with diabetic polyneuropathy: Secondary | ICD-10-CM

## 2016-04-29 DIAGNOSIS — E1165 Type 2 diabetes mellitus with hyperglycemia: Secondary | ICD-10-CM | POA: Insufficient documentation

## 2016-04-29 DIAGNOSIS — Z713 Dietary counseling and surveillance: Secondary | ICD-10-CM | POA: Diagnosis not present

## 2016-04-29 DIAGNOSIS — IMO0002 Reserved for concepts with insufficient information to code with codable children: Secondary | ICD-10-CM

## 2016-04-29 NOTE — Progress Notes (Signed)
Diabetes Self-Management Education  Visit Type: First/Initial  Appt. Start Time: 1010 Appt. End Time: 3846  04/29/2016  [name], identified by name and date of birth, is a 71 y.o. male with a diagnosis of Diabetes: Type 2. Other hx includes HTN, hyperlipidemia, anxiety, IBS and GERD.   Medications include Trulicity and Metformin. He is a patient of Dr. Loanne Drilling.  Patient's lives with his wife and his daughter has moved in.  He does most of the shopping and no one cooks much and often eat out or get simple to prepare foods.  His wife is having health issues.  He is retired from English as a second language teacher.    ASSESSMENT  Height 5' 10.5" (1.791 m), weight 200 lb (90.7 kg). Body mass index is 28.29 kg/m.  He has lost 5 lbs in the last week since starting on Trulicity. His highest weight was 230 lbs.      Diabetes Self-Management Education - 04/29/16 1022      Visit Information   Visit Type First/Initial     Initial Visit   Diabetes Type Type 2   Are you currently following a meal plan? Yes   What type of meal plan do you follow? low sugar, whole grains when available   Are you taking your medications as prescribed? Yes   Date Diagnosed 2006     Health Coping   How would you rate your overall health? Good  Meniscous surgery slowed me down.     Psychosocial Assessment   Patient Belief/Attitude about Diabetes Motivated to manage diabetes   Self-care barriers None   Self-management support Doctor's office;Family   Other persons present Patient   Patient Concerns Nutrition/Meal planning   Special Needs None   Preferred Learning Style No preference indicated   Learning Readiness Ready   How often do you need to have someone help you when you read instructions, pamphlets, or other written materials from your doctor or pharmacy? 1 - Never   What is the last grade level you completed in school? 4 years college     Pre-Education Assessment   Patient understands the  diabetes disease and treatment process. Demonstrates understanding / competency   Patient understands incorporating nutritional management into lifestyle. Needs Review   Patient undertands incorporating physical activity into lifestyle. Needs Review   Patient understands using medications safely. Demonstrates understanding / competency   Patient understands monitoring blood glucose, interpreting and using results Needs Review   Patient understands prevention, detection, and treatment of acute complications. Demonstrates understanding / competency   Patient understands prevention, detection, and treatment of chronic complications. Demonstrates understanding / competency   Patient understands how to develop strategies to address psychosocial issues. Demonstrates understanding / competency   Patient understands how to develop strategies to promote health/change behavior. Needs Review     Complications   Last HgB A1C per patient/outside source 7 %  04/06/16   How often do you check your blood sugar? 1-2 times/day   Fasting Blood glucose range (mg/dL) 130-179   Postprandial Blood glucose range (mg/dL) 130-179   Number of hypoglycemic episodes per month 0   Number of hyperglycemic episodes per week 0   Have you had a dilated eye exam in the past 12 months? Yes   Have you had a dental exam in the past 12 months? Yes   Are you checking your feet? Yes   How many days per week are you checking your feet? 3     Dietary Intake  Breakfast boiled eggs, white wheat toast with sugar free jam OR 1-2 slices chees toast OR 1-2 slices cheese OR instant oatmeal OR grits and an egg OR whole grain english muffin with cinnamon and butter, egg  9-10   Snack (morning) occasional walnuts   Lunch sandwich OR PB&J OR burger occasionally with fries    Snack (afternoon) fruit, greek yogurt (lite)   Dinner Lacinda Axon out or [employer] or [employer] or [employer] OR lean cuisine frozen meal    Snack (evening) nuts, sugar free  pudding, popcorn, sugar free cookies   Beverage(s) 1-2 cups coffee with stevia and cream, water     Exercise   Exercise Type Light (walking / raking leaves)  Health rider and knee exercises.  Treadmill at home but does not use.   How many days per week to you exercise? 3   How many minutes per day do you exercise? 60   Total minutes per week of exercise 180     Patient Education   Previous Diabetes Education No   Nutrition management  Role of diet in the treatment of diabetes and the relationship between the three main macronutrients and blood glucose level;Food label reading, portion sizes and measuring food.;Carbohydrate counting;Meal options for control of blood glucose level and chronic complications.;Information on hints to eating out and maintain blood glucose control.   Physical activity and exercise  Role of exercise on diabetes management, blood pressure control and cardiac health.   Medications Reviewed patients medication for diabetes, action, purpose, timing of dose and side effects.   Monitoring Identified appropriate SMBG and/or A1C goals.;Purpose and frequency of SMBG.;Daily foot exams   Chronic complications Dental care;Retinopathy and reason for yearly dilated eye exams   Psychosocial adjustment Worked with patient to identify barriers to care and solutions;Role of stress on diabetes;Identified and addressed patients feelings and concerns about diabetes     Individualized Goals (developed by patient)   Nutrition General guidelines for healthy choices and portions discussed   Physical Activity Exercise 5-7 days per week;30 minutes per day   Medications take my medication as prescribed   Monitoring  test my blood glucose as discussed   Reducing Risk Other (comment)  make healthier choices when eating out     Post-Education Assessment   Patient understands the diabetes disease and treatment process. Demonstrates understanding / competency   Patient understands incorporating  nutritional management into lifestyle. Demonstrates understanding / competency   Patient undertands incorporating physical activity into lifestyle. Demonstrates understanding / competency   Patient understands using medications safely. Demonstrates understanding / competency   Patient understands monitoring blood glucose, interpreting and using results Demonstrates understanding / competency   Patient understands prevention, detection, and treatment of acute complications. Demonstrates understanding / competency   Patient understands prevention, detection, and treatment of chronic complications. Demonstrates understanding / competency   Patient understands how to develop strategies to address psychosocial issues. Demonstrates understanding / competency   Patient understands how to develop strategies to promote health/change behavior. Demonstrates understanding / competency     Outcomes   Expected Outcomes Demonstrated interest in learning. Expect positive outcomes   Future DMSE PRN   Program Status Completed      Individualized Plan for Diabetes Self-Management Training:   Learning Objective:  Patient will have a greater understanding of diabetes self-management. Patient education plan is to attend individual and/or group sessions per assessed needs and concerns.   Plan:   Patient Instructions  Stay active most days of the week.  Aim for at least 30 minutes. Increase your intake of non starchy vegetables. Choose whole grains when possible. Choose baked, broiled or grilled rather than fried. Consider using the Calorie [employer] app to help you choose foods when eating out.  Aim for 3-4 Carb Choices per meal (45-60 grams) +/- 1 either way  Aim for 0-1 Carbs per snack if hungry  Include protein in moderation with your meals and snacks Consider reading food labels for Total Carbohydrate and Fat Grams of foods Consider checking BG at alternate times per day as directed by MD  Continue taking  medication as directed by MD      Expected Outcomes:  Demonstrated interest in learning. Expect positive outcomes  Education material provided: Living Well with Diabetes, Food label handouts, Meal plan card, My Plate and Snack sheet, breakfast  If problems or questions, patient to contact team via:  Phone  Future DSME appointment: PRN

## 2016-04-29 NOTE — Patient Instructions (Signed)
Stay active most days of the week.  Aim for at least 30 minutes. Increase your intake of non starchy vegetables. Choose whole grains when possible. Choose baked, broiled or grilled rather than fried. Consider using the Calorie [employer] app to help you choose foods when eating out.  Aim for 3-4 Carb Choices per meal (45-60 grams) +/- 1 either way  Aim for 0-1 Carbs per snack if hungry  Include protein in moderation with your meals and snacks Consider reading food labels for Total Carbohydrate and Fat Grams of foods Consider checking BG at alternate times per day as directed by MD  Continue taking medication as directed by MD

## 2016-05-16 ENCOUNTER — Other Ambulatory Visit: Payer: Self-pay | Admitting: Family Medicine

## 2016-06-08 DIAGNOSIS — M1712 Unilateral primary osteoarthritis, left knee: Secondary | ICD-10-CM | POA: Diagnosis not present

## 2016-06-15 DIAGNOSIS — M1712 Unilateral primary osteoarthritis, left knee: Secondary | ICD-10-CM | POA: Diagnosis not present

## 2016-06-23 DIAGNOSIS — M1712 Unilateral primary osteoarthritis, left knee: Secondary | ICD-10-CM | POA: Diagnosis not present

## 2016-06-27 ENCOUNTER — Ambulatory Visit (INDEPENDENT_AMBULATORY_CARE_PROVIDER_SITE_OTHER): Payer: Medicare Other | Admitting: Endocrinology

## 2016-06-27 ENCOUNTER — Encounter: Payer: Self-pay | Admitting: Endocrinology

## 2016-06-27 VITALS — BP 132/80 | HR 88 | Ht 70.5 in | Wt 193.0 lb

## 2016-06-27 DIAGNOSIS — E1142 Type 2 diabetes mellitus with diabetic polyneuropathy: Secondary | ICD-10-CM

## 2016-06-27 DIAGNOSIS — Z125 Encounter for screening for malignant neoplasm of prostate: Secondary | ICD-10-CM

## 2016-06-27 DIAGNOSIS — E1165 Type 2 diabetes mellitus with hyperglycemia: Secondary | ICD-10-CM | POA: Diagnosis not present

## 2016-06-27 DIAGNOSIS — IMO0002 Reserved for concepts with insufficient information to code with codable children: Secondary | ICD-10-CM

## 2016-06-27 DIAGNOSIS — Z0000 Encounter for general adult medical examination without abnormal findings: Secondary | ICD-10-CM | POA: Insufficient documentation

## 2016-06-27 LAB — CBC WITH DIFFERENTIAL/PLATELET
Basophils Absolute: 0.1 10*3/uL (ref 0.0–0.1)
Basophils Relative: 0.7 % (ref 0.0–3.0)
Eosinophils Absolute: 0.3 10*3/uL (ref 0.0–0.7)
Eosinophils Relative: 4 % (ref 0.0–5.0)
HCT: 43.3 % (ref 39.0–52.0)
Hemoglobin: 14.8 g/dL (ref 13.0–17.0)
Lymphocytes Relative: 24.6 % (ref 12.0–46.0)
Lymphs Abs: 2 10*3/uL (ref 0.7–4.0)
MCHC: 34.2 g/dL (ref 30.0–36.0)
MCV: 87.3 fl (ref 78.0–100.0)
Monocytes Absolute: 0.6 10*3/uL (ref 0.1–1.0)
Monocytes Relative: 7.7 % (ref 3.0–12.0)
Neutro Abs: 5.2 10*3/uL (ref 1.4–7.7)
Neutrophils Relative %: 63 % (ref 43.0–77.0)
Platelets: 210 10*3/uL (ref 150.0–400.0)
RBC: 4.96 Mil/uL (ref 4.22–5.81)
RDW: 13.7 % (ref 11.5–15.5)
WBC: 8.3 10*3/uL (ref 4.0–10.5)

## 2016-06-27 LAB — BASIC METABOLIC PANEL
BUN: 12 mg/dL (ref 6–23)
CALCIUM: 9.7 mg/dL (ref 8.4–10.5)
CO2: 27 meq/L (ref 19–32)
Chloride: 97 mEq/L (ref 96–112)
Creatinine, Ser: 0.96 mg/dL (ref 0.40–1.50)
GFR: 81.86 mL/min (ref >60.00)
GLUCOSE: 112 mg/dL — AB (ref 70–99)
Potassium: 4.5 mEq/L (ref 3.5–5.1)
Sodium: 135 mEq/L (ref 135–145)

## 2016-06-27 LAB — LIPID PANEL
CHOL/HDL RATIO: 2
Cholesterol: 115 mg/dL (ref 0–200)
HDL: 47.6 mg/dL (ref >39.00)
NonHDL: 67.42
TRIGLYCERIDES: 211 mg/dL — AB (ref 0.0–149.0)
VLDL: 42.2 mg/dL — ABNORMAL HIGH (ref 0.0–40.0)

## 2016-06-27 LAB — URINALYSIS, ROUTINE W REFLEX MICROSCOPIC
Bilirubin Urine: NEGATIVE
Hgb urine dipstick: NEGATIVE
Ketones, ur: NEGATIVE
Leukocytes, UA: NEGATIVE
Nitrite: NEGATIVE
RBC / HPF: NONE SEEN (ref 0–?)
Specific Gravity, Urine: 1.015 (ref 1.000–1.030)
Total Protein, Urine: NEGATIVE
Urine Glucose: NEGATIVE
Urobilinogen, UA: 0.2 (ref 0.0–1.0)
WBC, UA: NONE SEEN (ref 0–?)
pH: 5.5 (ref 5.0–8.0)

## 2016-06-27 LAB — HEPATIC FUNCTION PANEL
ALBUMIN: 4.5 g/dL (ref 3.5–5.2)
ALT: 22 U/L (ref 0–53)
AST: 28 U/L (ref 0–37)
Alkaline Phosphatase: 57 U/L (ref 39–117)
Bilirubin, Direct: 0.1 mg/dL (ref 0.0–0.3)
TOTAL PROTEIN: 7 g/dL (ref 6.0–8.3)
Total Bilirubin: 0.5 mg/dL (ref 0.2–1.2)

## 2016-06-27 LAB — POCT GLYCOSYLATED HEMOGLOBIN (HGB A1C): HEMOGLOBIN A1C: 6.4

## 2016-06-27 LAB — TSH: TSH: 1.61 u[IU]/mL (ref 0.35–4.50)

## 2016-06-27 LAB — LDL CHOLESTEROL, DIRECT: LDL DIRECT: 45 mg/dL

## 2016-06-27 LAB — MICROALBUMIN / CREATININE URINE RATIO
Creatinine,U: 124.1 mg/dL
Microalb Creat Ratio: 0.6 mg/g (ref 0.0–30.0)

## 2016-06-27 LAB — PSA: PSA: 0.6 ng/mL (ref 0.10–4.00)

## 2016-06-27 NOTE — Progress Notes (Signed)
Subjective:    Patient ID: Bruce Romero, male    DOB: 1945-10-24, 71 y.o.   MRN: [id number]  HPI Pt returns for f/u of diabetes mellitus: DM type: 2 Dx'ed: 3016 Complications: polyneuropathy Therapy: trulicity and metformin.   DKA: never Severe hypoglycemia: never. Pancreatitis: never Other: he has never been on insulin; he says he cannot afford to add another name brand medication. Interval history: He has slight nausea.  He brings a record of his cbg's which I have reviewed today.  All are in the 100's.  There is no trend throughout the day.  Past Medical History:  Diagnosis Date  . Acute medial meniscal tear, left, initial encounter   . Anxiety   . Diabetes mellitus   . GERD (gastroesophageal reflux disease)   . Hyperlipidemia   . Hypertension   . Irritable bowel   . Spastic colon    anaphylaxis due to Bruce Romero    Past Surgical History:  Procedure Laterality Date  . CHOLECYSTECTOMY    . CHONDROPLASTY Left 01/15/2016   Procedure: CHONDROPLASTY;  Surgeon: Bruce Nakayama, MD;  Location: [facility];  Service: Orthopedics;  Laterality: Left;  . KNEE ARTHROSCOPY WITH MEDIAL MENISECTOMY Left 01/15/2016   Procedure: ARTHROSCOPY LEFT KNEE WITH PARTIAL MEDIAL MENISECTOMY;  Surgeon: Bruce Nakayama, MD;  Location: [facility];  Service: Orthopedics;  Laterality: Left;  . NASAL SINUS SURGERY      Social History   Social History  . Marital status: Married    Spouse name: N/A  . Number of children: N/A  . Years of education: N/A   Occupational History  . Not on file.   Social History Main Topics  . Smoking status: Former Research scientist (life sciences)  . Smokeless tobacco: Never Used  . Alcohol use 0.0 oz/week     Comment: social  . Drug use: No  . Sexual activity: Not on file   Other Topics Concern  . Not on file   Social History Narrative  . No narrative on file    Current Outpatient Prescriptions on File Prior to Visit  Medication Sig Dispense  Refill  . albuterol (PROAIR HFA) 108 (90 BASE) MCG/ACT inhaler Inhale 2 puffs into the lungs every 6 (six) hours as needed for wheezing. 1 Inhaler 2  . ALPRAZolam (XANAX) 0.25 MG tablet take 1 tablet by mouth three times a day if needed 30 tablet 0  . amLODipine (NORVASC) 10 MG tablet take 1 tablet by mouth once daily 90 tablet 1  . aspirin 81 MG tablet Take 81 mg by mouth daily.    Marland Kitchen atorvastatin (LIPITOR) 10 MG tablet Take 1 tablet (10 mg total) by mouth daily. 90 tablet 1  . Blood Glucose Monitoring Suppl (ONE TOUCH ULTRA 2) w/Device KIT See admin instructions.  0  . candesartan (ATACAND) 16 MG tablet Take 1 tablet (16 mg total) by mouth at bedtime. 90 tablet 1  . Cholecalciferol (VITAMIN D) 2000 UNITS tablet Take 2,000 Units by mouth daily.      Marland Kitchen co-enzyme Q-10 30 MG capsule Take 100 mg by mouth daily.    . dapagliflozin propanediol (FARXIGA) 5 MG TABS tablet Take 5 mg by mouth daily.    Marland Kitchen dicyclomine (BENTYL) 20 MG tablet TAKE 1 TABLET BY MOUTH EVERY 6 HOURS 60 tablet 1  . Dulaglutide (TRULICITY) 1.5 WF/0.9NA SOPN 1.5 mg weekly 4 pen 3  . esomeprazole (NEXIUM) 40 MG capsule Take 20 mg by mouth daily.    . fluticasone (FLONASE)  50 MCG/ACT nasal spray Place into both nostrils daily.    . Glucosamine Sulfate 1000 MG TABS Take 1,000 tablets by mouth 2 (two) times daily.    Marland Kitchen glucose blood (ONE TOUCH ULTRA TEST) test strip Check blood sugar 2 times daily. Dx: E11.9 100 each 3  . Insulin Pen Needle 32G X 6 MM MISC Use as directed with insulin pen 30 each 3  . linaclotide (LINZESS) 290 MCG CAPS capsule Take 1 capsule (290 mcg total) by mouth daily before breakfast. 30 capsule 5  . MAGNESIUM GLYCINATE PLUS PO Take 400 tablets by mouth 2 (two) times daily.    . Melatonin 3 MG TABS Take 3 tablets by mouth as needed.    . metFORMIN (GLUCOPHAGE-XR) 750 MG 24 hr tablet take 1 tablet by mouth twice a day with food 60 tablet 2  . moxifloxacin (VIGAMOX) 0.5 % ophthalmic solution Place 1 drop into both  eyes 3 (three) times daily. 3 mL 0  . niacin (SLO-NIACIN) 500 MG tablet Take 500 mg by mouth at bedtime.    . [DISCONTINUED] citalopram (CELEXA) 10 MG tablet Take 1 tablet (10 mg total) by mouth daily. 90 tablet 0   No current facility-administered medications on file prior to visit.     Allergies  Allergen Reactions  . Cephalexin Anaphylaxis    REACTION: pt is not allergic to penicillin  . Bactrim [Sulfamethoxazole-Trimethoprim] Other (See Comments)    ? Took Bactrim and Flexeril at same time had lip swelling unsure which caused reaction.  Yvette Rack [Cyclobenzaprine] Swelling    Family History  Problem Relation Age of Onset  . Stroke Mother   . Pulmonary embolism Father   . Diabetes Brother     BP 132/80   Pulse 88   Ht 5' 10.5" (1.791 m)   Wt 193 lb (87.5 kg)   SpO2 97%   BMI 27.30 kg/m    Review of Systems He has lost weight since on the trulicity.      Objective:   Physical Exam VITAL SIGNS:  See vs page GENERAL: no distress Pulses: dorsalis pedis intact bilat.   MSK: no deformity of the feet CV: no leg edema Skin:  no ulcer on the feet.  normal color and temp on the feet. Neuro: sensation is intact to touch on the feet   A1c=6.4%  Lab Results  Component Value Date   CHOL 115 06/27/2016   HDL 47.60 06/27/2016   LDLCALC 29 04/06/2016   LDLDIRECT 45.0 06/27/2016   TRIG 211.0 (H) 06/27/2016   CHOLHDL 2 06/27/2016      Assessment & Plan:  Type 2 DM, with polyneuropathy: well-controlled Dyslipidemia: well-controlled.  Please continue the same lipitor.  Patient Instructions  check your blood sugar once a day.  vary the time of day when you check, between before the 3 meals, and at bedtime.  also check if you have symptoms of your blood sugar being too high or too low.  please keep a record of the readings and bring it to your next appointment here (or you can bring the meter itself).  You can write it on any piece of paper.  please call us sooner if your  blood sugar goes below 70, or if you have a lot of readings over 200.  You can have your blood tests today, as you will see Dr Bruce Romero for you annul physical.  Please continue the same medications for now.  Please come back for a follow-up appointment in 4  months.

## 2016-06-27 NOTE — Patient Instructions (Addendum)
check your blood sugar once a day.  vary the time of day when you check, between before the 3 meals, and at bedtime.  also check if you have symptoms of your blood sugar being too high or too low.  please keep a record of the readings and bring it to your next appointment here (or you can bring the meter itself).  You can write it on any piece of paper.  please call us sooner if your blood sugar goes below 70, or if you have a lot of readings over 200.  You can have your blood tests today, as you will see Dr Mikle Bosworth for you annul physical.  Please continue the same medications for now.  Please come back for a follow-up appointment in 4 months.

## 2016-06-30 ENCOUNTER — Encounter: Payer: Self-pay | Admitting: Family Medicine

## 2016-06-30 ENCOUNTER — Ambulatory Visit (INDEPENDENT_AMBULATORY_CARE_PROVIDER_SITE_OTHER): Payer: Medicare Other | Admitting: Family Medicine

## 2016-06-30 VITALS — BP 120/80 | HR 80 | Temp 97.7°F | Resp 16 | Ht 69.7 in | Wt 192.2 lb

## 2016-06-30 DIAGNOSIS — E785 Hyperlipidemia, unspecified: Secondary | ICD-10-CM | POA: Diagnosis not present

## 2016-06-30 DIAGNOSIS — I10 Essential (primary) hypertension: Secondary | ICD-10-CM | POA: Diagnosis not present

## 2016-06-30 DIAGNOSIS — E1142 Type 2 diabetes mellitus with diabetic polyneuropathy: Secondary | ICD-10-CM

## 2016-06-30 DIAGNOSIS — E1165 Type 2 diabetes mellitus with hyperglycemia: Secondary | ICD-10-CM

## 2016-06-30 DIAGNOSIS — Z0000 Encounter for general adult medical examination without abnormal findings: Secondary | ICD-10-CM | POA: Diagnosis not present

## 2016-06-30 DIAGNOSIS — IMO0002 Reserved for concepts with insufficient information to code with codable children: Secondary | ICD-10-CM

## 2016-06-30 DIAGNOSIS — Z79899 Other long term (current) drug therapy: Secondary | ICD-10-CM | POA: Diagnosis not present

## 2016-06-30 NOTE — Patient Instructions (Signed)
Preventive Care 65 Years and Older, Male Preventive care refers to lifestyle choices and visits with your health care provider that can promote health and wellness. What does preventive care include?  A yearly physical exam. This is also called an annual well check.  Dental exams once or twice a year.  Routine eye exams. Ask your health care provider how often you should have your eyes checked.  Personal lifestyle choices, including:  Daily care of your teeth and gums.  Regular physical activity.  Eating a healthy diet.  Avoiding tobacco and drug use.  Limiting alcohol use.  Practicing safe sex.  Taking low doses of aspirin every day.  Taking vitamin and mineral supplements as recommended by your health care provider. What happens during an annual well check? The services and screenings done by your health care provider during your annual well check will depend on your age, overall health, lifestyle risk factors, and family history of disease. Counseling  Your health care provider may ask you questions about your:  Alcohol use.  Tobacco use.  Drug use.  Emotional well-being.  Home and relationship well-being.  Sexual activity.  Eating habits.  History of falls.  Memory and ability to understand (cognition).  Work and work environment. Screening  You may have the following tests or measurements:  Height, weight, and BMI.  Blood pressure.  Lipid and cholesterol levels. These may be checked every 5 years, or more frequently if you are over 50 years old.  Skin check.  Lung cancer screening. You may have this screening every year starting at age 55 if you have a 30-pack-year history of smoking and currently smoke or have quit within the past 15 years.  Fecal occult blood test (FOBT) of the stool. You may have this test every year starting at age 50.  Flexible sigmoidoscopy or colonoscopy. You may have a sigmoidoscopy every 5 years or a colonoscopy every 10  years starting at age 50.  Prostate cancer screening. Recommendations will vary depending on your family history and other risks.  Hepatitis C blood test.  Hepatitis B blood test.  Sexually transmitted disease (STD) testing.  Diabetes screening. This is done by checking your blood sugar (glucose) after you have not eaten for a while (fasting). You may have this done every 1-3 years.  Abdominal aortic aneurysm (AAA) screening. You may need this if you are a current or former smoker.  Osteoporosis. You may be screened starting at age 70 if you are at high risk. Talk with your health care provider about your test results, treatment options, and if necessary, the need for more tests. Vaccines  Your health care provider may recommend certain vaccines, such as:  Influenza vaccine. This is recommended every year.  Tetanus, diphtheria, and acellular pertussis (Tdap, Td) vaccine. You may need a Td booster every 10 years.  Varicella vaccine. You may need this if you have not been vaccinated.  Zoster vaccine. You may need this after age 60.  Measles, mumps, and rubella (MMR) vaccine. You may need at least one dose of MMR if you were born in 1957 or later. You may also need a second dose.  Pneumococcal 13-valent conjugate (PCV13) vaccine. One dose is recommended after age 65.  Pneumococcal polysaccharide (PPSV23) vaccine. One dose is recommended after age 65.  Meningococcal vaccine. You may need this if you have certain conditions.  Hepatitis A vaccine. You may need this if you have certain conditions or if you travel or work in places where   you may be exposed to hepatitis A.  Hepatitis B vaccine. You may need this if you have certain conditions or if you travel or work in places where you may be exposed to hepatitis B.  Haemophilus influenzae type b (Hib) vaccine. You may need this if you have certain risk factors. Talk to your health care provider about which screenings and vaccines you  need and how often you need them. This information is not intended to replace advice given to you by your health care provider. Make sure you discuss any questions you have with your health care provider. Document Released: 02/20/2015 Document Revised: 10/14/2015 Document Reviewed: 11/25/2014 Elsevier Interactive Patient Education  2017 [employer].

## 2016-06-30 NOTE — Assessment & Plan Note (Signed)
Well controlled, no changes to meds. Encouraged heart healthy diet such as the DASH diet and exercise as tolerated.  °

## 2016-06-30 NOTE — Assessment & Plan Note (Signed)
Tolerating statin, encouraged heart healthy diet, avoid trans fats, minimize simple carbs and saturated fats. Increase exercise as tolerated 

## 2016-06-30 NOTE — Progress Notes (Signed)
Patient ID: Bruce Romero, male    DOB: Nov 09, 1945  Age: 71 y.o. MRN: [id number]    Subjective:  Subjective  HPI Bruce Romero presents for cpe and f/u dm, cholesterol and  Htn.  No complaints  Review of Systems  Constitutional: Negative.   HENT: Negative for congestion, ear pain, hearing loss, nosebleeds, postnasal drip, rhinorrhea, sinus pressure, sneezing and tinnitus.   Eyes: Negative for photophobia, discharge, itching and visual disturbance.  Respiratory: Negative.   Cardiovascular: Negative.   Gastrointestinal: Negative for abdominal distention, abdominal pain, anal bleeding, blood in stool and constipation.  Endocrine: Negative.   Genitourinary: Negative.   Musculoskeletal: Negative.   Skin: Negative.   Allergic/Immunologic: Negative.   Neurological: Negative for dizziness, weakness, light-headedness, numbness and headaches.  Psychiatric/Behavioral: Negative for agitation, confusion, decreased concentration, dysphoric mood, sleep disturbance and suicidal ideas. The patient is not nervous/anxious.     History Past Medical History:  Diagnosis Date  . Acute medial meniscal tear, left, initial encounter   . Anxiety   . Diabetes mellitus   . GERD (gastroesophageal reflux disease)   . Hyperlipidemia   . Hypertension   . Irritable bowel   . Spastic colon    anaphylaxis due to [place]    He has a past surgical history that includes Cholecystectomy; Nasal sinus surgery; Knee arthroscopy with medial menisectomy (Left, 01/15/2016); and Chondroplasty (Left, 01/15/2016).   His family history includes Diabetes in his brother; Pulmonary embolism in his father; Stroke in his mother.He reports that he has quit smoking. He has never used smokeless tobacco. He reports that he drinks alcohol. He reports that he does not use drugs.  Current Outpatient Prescriptions on File Prior to Visit  Medication Sig Dispense Refill  . albuterol (PROAIR HFA) 108 (90 BASE) MCG/ACT inhaler  Inhale 2 puffs into the lungs every 6 (six) hours as needed for wheezing. 1 Inhaler 2  . ALPRAZolam (XANAX) 0.25 MG tablet take 1 tablet by mouth three times a day if needed 30 tablet 0  . amLODipine (NORVASC) 10 MG tablet take 1 tablet by mouth once daily 90 tablet 1  . aspirin 81 MG tablet Take 81 mg by mouth daily.    Marland Kitchen atorvastatin (LIPITOR) 10 MG tablet Take 1 tablet (10 mg total) by mouth daily. 90 tablet 1  . Blood Glucose Monitoring Suppl (ONE TOUCH ULTRA 2) w/Device KIT See admin instructions.  0  . candesartan (ATACAND) 16 MG tablet Take 1 tablet (16 mg total) by mouth at bedtime. 90 tablet 1  . Cholecalciferol (VITAMIN D) 2000 UNITS tablet Take 2,000 Units by mouth daily.      Marland Kitchen co-enzyme Q-10 30 MG capsule Take 100 mg by mouth daily.    Marland Kitchen dicyclomine (BENTYL) 20 MG tablet TAKE 1 TABLET BY MOUTH EVERY 6 HOURS 60 tablet 1  . Dulaglutide (TRULICITY) 1.5 ON/6.2XB SOPN 1.5 mg weekly 4 pen 3  . esomeprazole (NEXIUM) 40 MG capsule Take 20 mg by mouth daily.    . fluticasone (FLONASE) 50 MCG/ACT nasal spray Place into both nostrils daily.    . Glucosamine Sulfate 1000 MG TABS Take 1,000 tablets by mouth 2 (two) times daily.    Marland Kitchen glucose blood (ONE TOUCH ULTRA TEST) test strip Check blood sugar 2 times daily. Dx: E11.9 100 each 3  . Insulin Pen Needle 32G X 6 MM MISC Use as directed with insulin pen 30 each 3  . linaclotide (LINZESS) 290 MCG CAPS capsule Take 1 capsule (290 mcg total)  by mouth daily before breakfast. 30 capsule 5  . MAGNESIUM GLYCINATE PLUS PO Take 400 tablets by mouth 2 (two) times daily.    . Melatonin 3 MG TABS Take 3 tablets by mouth as needed.    . metFORMIN (GLUCOPHAGE-XR) 750 MG 24 hr tablet take 1 tablet by mouth twice a day with food 60 tablet 2  . moxifloxacin (VIGAMOX) 0.5 % ophthalmic solution Place 1 drop into both eyes 3 (three) times daily. 3 mL 0  . niacin (SLO-NIACIN) 500 MG tablet Take 500 mg by mouth at bedtime.    . [DISCONTINUED] citalopram (CELEXA) 10  MG tablet Take 1 tablet (10 mg total) by mouth daily. 90 tablet 0   No current facility-administered medications on file prior to visit.      Objective:  Objective  Physical Exam  Constitutional: He is oriented to person, place, and time. He appears well-developed and well-nourished. No distress.  HENT:  Head: Normocephalic and atraumatic.  Right Ear: External ear normal.  Left Ear: External ear normal.  Nose: Nose normal.  Mouth/Throat: Oropharynx is clear and moist. No oropharyngeal exudate.  Eyes: Conjunctivae and EOM are normal. Pupils are equal, round, and reactive to light. Right eye exhibits no discharge. Left eye exhibits no discharge.  Neck: Normal range of motion. Neck supple. No JVD present. No thyromegaly present.  Cardiovascular: Normal rate, regular rhythm and intact distal pulses.  Exam reveals no gallop and no friction rub.   No murmur heard. Pulmonary/Chest: Effort normal and breath sounds normal. No respiratory distress. He has no wheezes. He has no rales. He exhibits no tenderness.  Abdominal: Soft. Bowel sounds are normal. He exhibits no distension and no mass. There is no tenderness. There is no rebound and no guarding.  Genitourinary: Rectum normal, prostate normal and penis normal. Rectal exam shows guaiac negative stool.  Musculoskeletal: Normal range of motion. He exhibits no edema or tenderness.  Lymphadenopathy:    He has no cervical adenopathy.  Neurological: He is alert and oriented to person, place, and time. He displays normal reflexes. He exhibits normal muscle tone.  Skin: Skin is warm and dry. No rash noted. He is not diaphoretic. No erythema. No pallor.  Psychiatric: He has a normal mood and affect. His behavior is normal. Judgment and thought content normal.  Nursing note and vitals reviewed.  BP 120/80 (BP Location: Right Arm, Cuff Size: Normal)   Pulse 80   Temp 97.7 F (36.5 C) (Oral)   Resp 16   Ht 5' 9.7" (1.77 m)   Wt 192 lb 3.2 oz (87.2  kg)   SpO2 98%   BMI 27.82 kg/m  Wt Readings from Last 3 Encounters:  06/30/16 192 lb 3.2 oz (87.2 kg)  06/27/16 193 lb (87.5 kg)  04/29/16 200 lb (90.7 kg)     Lab Results  Component Value Date   WBC 8.3 06/27/2016   HGB 14.8 06/27/2016   HCT 43.3 06/27/2016   PLT 210.0 06/27/2016   GLUCOSE 112 (H) 06/27/2016   CHOL 115 06/27/2016   TRIG 211.0 (H) 06/27/2016   HDL 47.60 06/27/2016   LDLDIRECT 45.0 06/27/2016   LDLCALC 29 04/06/2016   ALT 22 06/27/2016   AST 28 06/27/2016   NA 135 06/27/2016   K 4.5 06/27/2016   CL 97 06/27/2016   CREATININE 0.96 06/27/2016   BUN 12 06/27/2016   CO2 27 06/27/2016   TSH 1.61 06/27/2016   PSA 0.60 06/27/2016   HGBA1C 6.4 06/27/2016  MICROALBUR <0.7 06/27/2016    No results found.   Assessment & Plan:  Plan  I have discontinued Mr. Nardozzi dapagliflozin propanediol and bacitracin. I am also having him maintain his Vitamin D, esomeprazole, niacin, albuterol, co-enzyme Q-10, Melatonin, Glucosamine Sulfate, MAGNESIUM GLYCINATE PLUS PO, aspirin, ONE TOUCH ULTRA 2, atorvastatin, candesartan, linaclotide, fluticasone, ALPRAZolam, Insulin Pen Needle, glucose blood, amLODipine, moxifloxacin, Dulaglutide, dicyclomine, and metFORMIN.  Meds ordered this encounter  Medications  . DISCONTD: bacitracin ophthalmic ointment    Refill:  0    Problem List Items Addressed This Visit      High   Preventative health care - Primary    ghm utd Check labs See avs        Unprioritized   ESSENTIAL HYPERTENSION, BENIGN    Well controlled, no changes to meds. Encouraged heart healthy diet such as the DASH diet and exercise as tolerated.       Hyperlipidemia LDL goal <100    Tolerating statin, encouraged heart healthy diet, avoid trans fats, minimize simple carbs and saturated fats. Increase exercise as tolerated      Type II diabetes mellitus, uncontrolled ([place])    Per endocrine Labs reviewed         Follow-up: Return in about 6  months (around 12/31/2016) for hypertension, hyperlipidemia.  Ann Held, DO

## 2016-06-30 NOTE — Assessment & Plan Note (Signed)
ghm utd Check labs  See avs  

## 2016-06-30 NOTE — Assessment & Plan Note (Signed)
Per endocrine Labs reviewed

## 2016-07-05 ENCOUNTER — Encounter: Payer: Self-pay | Admitting: *Deleted

## 2016-07-05 ENCOUNTER — Ambulatory Visit (INDEPENDENT_AMBULATORY_CARE_PROVIDER_SITE_OTHER): Payer: Medicare Other | Admitting: *Deleted

## 2016-07-05 VITALS — BP 110/70 | HR 78 | Ht 70.0 in | Wt 193.4 lb

## 2016-07-05 DIAGNOSIS — Z0000 Encounter for general adult medical examination without abnormal findings: Secondary | ICD-10-CM

## 2016-07-05 NOTE — Patient Instructions (Addendum)
Bruce Romero , Thank you for taking time to come for your Medicare Wellness Visit. I appreciate your ongoing commitment to your health goals. Please review the following plan we discussed and let me know if I can assist you in the future.   These are the goals we discussed: Goals    . Maintain current health          Keep diabetes and arthritis under control.       This is a list of the screening recommended for you and due dates:  Health Maintenance  Topic Date Due  . Eye exam for diabetics  07/06/2016  . Flu Shot  09/07/2016  . Hemoglobin A1C  12/28/2016  . Complete foot exam   06/27/2017  . Colon Cancer Screening  06/27/2020  . Tetanus Vaccine  05/08/2024  .  Hepatitis C: One time screening is recommended by Center for Disease Control  (CDC) for  adults born from [birth year] through 1965.   Completed  . Pneumonia vaccines  Completed    Health Maintenance, Male A healthy lifestyle and preventive care is important for your health and wellness. Ask your health care provider about what schedule of regular examinations is right for you. What should I know about weight and diet?  Eat a Healthy Diet  Eat plenty of vegetables, fruits, whole grains, low-fat dairy products, and lean protein.  Do not eat a lot of foods high in solid fats, added sugars, or salt. Maintain a Healthy Weight  Regular exercise can help you achieve or maintain a healthy weight. You should:  Do at least 150 minutes of exercise each week. The exercise should increase your heart rate and make you sweat (moderate-intensity exercise).  Do strength-training exercises at least twice a week. Watch Your Levels of Cholesterol and Blood Lipids  Have your blood tested for lipids and cholesterol every 5 years starting at 72 years of age. If you are at high risk for heart disease, you should start having your blood tested when you are 72 years old. You may need to have your cholesterol levels checked more often if:  Your  lipid or cholesterol levels are high.  You are older than 72 years of age.  You are at high risk for heart disease. What should I know about cancer screening? Many types of cancers can be detected early and may often be prevented. Lung Cancer  You should be screened every year for lung cancer if:  You are a current smoker who has smoked for at least 30 years.  You are a former smoker who has quit within the past 15 years.  Talk to your health care provider about your screening options, when you should start screening, and how often you should be screened. Colorectal Cancer  Routine colorectal cancer screening usually begins at 72 years of age and should be repeated every 5-10 years until you are 72 years old. You may need to be screened more often if early forms of precancerous polyps or small growths are found. Your health care provider may recommend screening at an earlier age if you have risk factors for colon cancer.  Your health care provider may recommend using home test kits to check for hidden blood in the stool.  A small camera at the end of a tube can be used to examine your colon (sigmoidoscopy or colonoscopy). This checks for the earliest forms of colorectal cancer. Prostate and Testicular Cancer  Depending on your age and overall health, your health  care provider may do certain tests to screen for prostate and testicular cancer.  Talk to your health care provider about any symptoms or concerns you have about testicular or prostate cancer. Skin Cancer  Check your skin from head to toe regularly.  Tell your health care provider about any new moles or changes in moles, especially if:  There is a change in a mole's size, shape, or color.  You have a mole that is larger than a pencil eraser.  Always use sunscreen. Apply sunscreen liberally and repeat throughout the day.  Protect yourself by wearing long sleeves, pants, a wide-brimmed hat, and sunglasses when  outside. What should I know about heart disease, diabetes, and high blood pressure?  If you are 41-80 years of age, have your blood pressure checked every 3-5 years. If you are 63 years of age or older, have your blood pressure checked every year. You should have your blood pressure measured twice-once when you are at a hospital or clinic, and once when you are not at a hospital or clinic. Record the average of the two measurements. To check your blood pressure when you are not at a hospital or clinic, you can use:  An automated blood pressure machine at a pharmacy.  A home blood pressure monitor.  Talk to your health care provider about your target blood pressure.  If you are between 14-75 years old, ask your health care provider if you should take aspirin to prevent heart disease.  Have regular diabetes screenings by checking your fasting blood sugar level.  If you are at a normal weight and have a low risk for diabetes, have this test once every three years after the age of 64.  If you are overweight and have a high risk for diabetes, consider being tested at a younger age or more often.  A one-time screening for abdominal aortic aneurysm (AAA) by ultrasound is recommended for men aged 18-75 years who are current or former smokers. What should I know about preventing infection? Hepatitis B  If you have a higher risk for hepatitis B, you should be screened for this virus. Talk with your health care provider to find out if you are at risk for hepatitis B infection. Hepatitis C  Blood testing is recommended for:  Everyone born from [birth year] through 1966.  Anyone with known risk factors for hepatitis C. Sexually Transmitted Diseases (STDs)  You should be screened each year for STDs including gonorrhea and chlamydia if:  You are sexually active and are younger than 72 years of age.  You are older than 72 years of age and your health care provider tells you that you are at risk for this  type of infection.  Your sexual activity has changed since you were last screened and you are at an increased risk for chlamydia or gonorrhea. Ask your health care provider if you are at risk.  Talk with your health care provider about whether you are at high risk of being infected with HIV. Your health care provider may recommend a prescription medicine to help prevent HIV infection. What else can I do?  Schedule regular health, dental, and eye exams.  Stay current with your vaccines (immunizations).  Do not use any tobacco products, such as cigarettes, chewing tobacco, and e-cigarettes. If you need help quitting, ask your health care provider.  Limit alcohol intake to no more than 2 drinks per day. One drink equals 12 ounces of beer, 5 ounces of wine, or 1  ounces of hard liquor.  Do not use street drugs.  Do not share needles.  Ask your health care provider for help if you need support or information about quitting drugs.  Tell your health care provider if you often feel depressed.  Tell your health care provider if you have ever been abused or do not feel safe at home. This information is not intended to replace advice given to you by your health care provider. Make sure you discuss any questions you have with your health care provider. Document Released: 07/23/2007 Document Revised: 09/23/2015 Document Reviewed: 10/28/2014 Elsevier Interactive Patient Education  2017 [employer].

## 2016-07-05 NOTE — Progress Notes (Signed)
Subjective:   Bruce Romero is a 71 y.o. male who presents for an Initial Medicare Annual Wellness Visit.  He brings a copy of his living will and HCPOA with him today.   The Patient was informed that the wellness visit is to identify future health risk and educate and initiate measures that can reduce risk for increased disease through the lifespan.   Describes health as fair, good or great? "Good, except for my knee."  Review of Systems  No ROS.  Medicare Wellness Visit.  Cardiac Risk Factors include: advanced age (>30mn, >>57women);diabetes mellitus;hypertension;male gender  Sleep patterns: no sleep issues, gets up 2-3 times nightly to void.  Home Safety/Smoke Alarms: Feels safe in home. Smoke alarms in place. Security system in place.  Living environment; residence and Firearm Safety: Lives w/ wife and daughter, who is a nMarine scientist firearms stored safely. 3 story house, but does not use the top level. No issues navigating stairs. Seat Belt Safety/Bike Helmet: Wears seat belt.   Counseling:   Eye Exam- Follows w/ Dr. SChristain Sacramentoyearly Dental- Follows w/ Dr. SRip Harbourtwice yearly  Male:   CCS- last 06/28/10. 1 polyp removed, diverticulosis in sigmoid colon, otherwise normal. 10 year recall.      PSA-  Lab Results  Component Value Date   PSA 0.60 06/27/2016   PSA 0.71 09/16/2013   PSA 0.52 11/03/2009      Objective:    Today's Vitals   07/05/16 1127  BP: 110/70  Pulse: 78  SpO2: 98%  Weight: 193 lb 6.4 oz (87.7 kg)  Height: '5\' 10"'  (1.778 m)   Body mass index is 27.75 kg/m.  Current Medications (verified) Outpatient Encounter Prescriptions as of 07/05/2016  Medication Sig  . albuterol (PROAIR HFA) 108 (90 BASE) MCG/ACT inhaler Inhale 2 puffs into the lungs every 6 (six) hours as needed for wheezing.  .Marland KitchenALPRAZolam (XANAX) 0.25 MG tablet take 1 tablet by mouth three times a day if needed  . amLODipine (NORVASC) 10 MG tablet take 1 tablet by mouth once daily    . aspirin 81 MG tablet Take 81 mg by mouth daily.  .Marland Kitchenatorvastatin (LIPITOR) 10 MG tablet Take 1 tablet (10 mg total) by mouth daily.  . Blood Glucose Monitoring Suppl (ONE TOUCH ULTRA 2) w/Device KIT See admin instructions.  . candesartan (ATACAND) 16 MG tablet Take 1 tablet (16 mg total) by mouth at bedtime.  . Cholecalciferol (VITAMIN D) 2000 UNITS tablet Take 2,000 Units by mouth daily.    .Marland Kitchenco-enzyme Q-10 30 MG capsule Take 100 mg by mouth daily.  .Marland Kitchendicyclomine (BENTYL) 20 MG tablet TAKE 1 TABLET BY MOUTH EVERY 6 HOURS  . Dulaglutide (TRULICITY) 1.5 MAC/1.6SASOPN 1.5 mg weekly  . esomeprazole (NEXIUM) 40 MG capsule Take 20 mg by mouth daily.  . fluticasone (FLONASE) 50 MCG/ACT nasal spray Place into both nostrils daily.  . Glucosamine Sulfate 1000 MG TABS Take 1,000 tablets by mouth 2 (two) times daily.  .Marland Kitchenglucose blood (ONE TOUCH ULTRA TEST) test strip Check blood sugar 2 times daily. Dx: E11.9  . Insulin Pen Needle 32G X 6 MM MISC Use as directed with insulin pen  . linaclotide (LINZESS) 290 MCG CAPS capsule Take 1 capsule (290 mcg total) by mouth daily before breakfast.  . MAGNESIUM GLYCINATE PLUS PO Take 400 tablets by mouth 2 (two) times daily.  . Melatonin 3 MG TABS Take 3 tablets by mouth as needed.  . metFORMIN (GLUCOPHAGE-XR) 750 MG  24 hr tablet take 1 tablet by mouth twice a day with food  . moxifloxacin (VIGAMOX) 0.5 % ophthalmic solution Place 1 drop into both eyes 3 (three) times daily.  . niacin (SLO-NIACIN) 500 MG tablet Take 500 mg by mouth at bedtime.   No facility-administered encounter medications on file as of 07/05/2016.     Allergies (verified) Cephalexin; Bactrim [sulfamethoxazole-trimethoprim]; and Flexeril [cyclobenzaprine]   History: Past Medical History:  Diagnosis Date  . Acute medial meniscal tear, left, initial encounter   . Anxiety   . Diabetes mellitus   . GERD (gastroesophageal reflux disease)   . Hyperlipidemia   . Hypertension   . Irritable  bowel   . Spastic colon    anaphylaxis due to [place]   Past Surgical History:  Procedure Laterality Date  . CHOLECYSTECTOMY    . CHONDROPLASTY Left 01/15/2016   Procedure: CHONDROPLASTY;  Surgeon: Melrose Nakayama, MD;  Location: [place] [facility];  Service: Orthopedics;  Laterality: Left;  . KNEE ARTHROSCOPY WITH MEDIAL MENISECTOMY Left 01/15/2016   Procedure: ARTHROSCOPY LEFT KNEE WITH PARTIAL MEDIAL MENISECTOMY;  Surgeon: Melrose Nakayama, MD;  Location: [place];  Service: Orthopedics;  Laterality: Left;  . NASAL SINUS SURGERY     Family History  Problem Relation Age of Onset  . Stroke Mother   . Pulmonary embolism Father   . Diabetes Brother    Social History   Occupational History  . retired    Social History Main Topics  . Smoking status: Former Research scientist (life sciences)  . Smokeless tobacco: Never Used  . Alcohol use 0.0 oz/week     Comment: social  . Drug use: No  . Sexual activity: Not on file   Tobacco Counseling Counseling given: Not Answered   Activities of Daily Living In your present state of health, do you have any difficulty performing the following activities: 07/05/2016 01/15/2016  Hearing? N N  Vision? N N  Difficulty concentrating or making decisions? N N  Walking or climbing stairs? N N  Dressing or bathing? N N  Doing errands, shopping? N -  Preparing Food and eating ? N -  Managing your Medications? N -  Managing your Finances? N -  Housekeeping or managing your Housekeeping? N -  Some recent data might be hidden    Immunizations and Health Maintenance Immunization History  Administered Date(s) Administered  . Influenza Split 01/04/2011, 11/21/2011  . Influenza Whole 01/07/1999  . Influenza, High Dose Seasonal PF 10/15/2015  . Influenza,inj,Quad PF,36+ Mos 11/29/2012, 11/18/2013, 02/10/2015  . Pneumococcal Conjugate-13 01/21/2014  . Pneumococcal Polysaccharide-23 04/08/2015  . Td 05/03/2004  . Tdap 05/09/2014  . Zoster  05/13/2008   There are no preventive care reminders to display for this patient.  Patient Care Team: Carollee Herter, Alferd Apa, DO as PCP - General Bjorn Loser, MD as Consulting Physician (Urology) Renato Shin, MD as Consulting Physician (Endocrinology) Druscilla Brownie, MD as Consulting Physician (Dermatology) Lorretta Harp, MD as Consulting Physician (Cardiology) Melrose Nakayama, MD as Consulting Physician (Orthopedic Surgery) Christain Sacramento, [place] as Consulting Physician (Optometry) Rip Harbour, DDS as Consulting Physician (Dentistry)  Indicate any recent Medical Services you may have received from other than [facility] providers in the past year (date may be approximate).    Assessment:   This is a routine wellness examination for Bruce Romero. Physical assessment deferred to PCP.  Hearing/Vision screen Hearing Screening Comments: Able to hear conversational tones w/o difficulty. No issues reported. Vision Screening Comments: No vision issues. Follows w/ Dr.  Stacie Palmer yearly. Wears reading glasses.  Dietary issues and exercise activities discussed: Current Exercise Habits: Structured exercise class, Type of exercise: strength training/weights;Other - see comments (yard work), Intensity: Moderate  Diet (meal preparation, eat out, water intake, caffeinated beverages, dairy products, fruits and vegetables): in general, a "healthy" diet  , well balanced, on average, 3 meals per day, diabetic. Eats sugar free popsicles and Jell-O. Generally aims for less than 60 g carbs per meal per nutritionist. Valentino Saxon to minimize nighttime snacking. Drinks water throughout the day.   Goals    . Maintain current health          Keep diabetes and arthritis under control.      Depression Screen PHQ 2/9 Scores 07/05/2016 04/29/2016 07/02/2015 05/15/2014  PHQ - 2 Score 0 0 0 0    Fall Risk Fall Risk  07/05/2016 04/29/2016 07/02/2015 05/15/2014 03/04/2013  Falls in the past year? No No No No No    Cognitive  Function: MMSE - Mini Mental State Exam 07/05/2016  Orientation to time 5  Orientation to Place 5  Registration 3  Attention/ Calculation 5  Recall 3  Language- name 2 objects 2  Language- repeat 1  Language- follow 3 step command 3  Language- read & follow direction 1  Write a sentence 1  Copy design 1  Total score 30        Screening Tests Health Maintenance  Topic Date Due  . OPHTHALMOLOGY EXAM  07/06/2016  . INFLUENZA VACCINE  09/07/2016  . HEMOGLOBIN A1C  12/28/2016  . FOOT EXAM  06/27/2017  . COLONOSCOPY  06/27/2020  . TETANUS/TDAP  05/08/2024  . Hepatitis C Screening  Completed  . PNA vac Low Risk Adult  Completed        Plan:    Follow-up w/ PCP as scheduled.  Advance directives forwarded to provider for review; to be sent for scanning after.  I have personally reviewed and noted the following in the patient's chart:   . Medical and social history . Use of alcohol, tobacco or illicit drugs  . Current medications and supplements . Functional ability and status . Nutritional status . Physical activity . Advanced directives . List of other physicians . Vitals . Screenings to include cognitive, depression, and falls . Referrals and appointments  In addition, I have reviewed and discussed with patient certain preventive protocols, quality metrics, and best practice recommendations. A written personalized care plan for preventive services as well as general preventive health recommendations were provided to patient.     Dorrene German, RN   07/05/2016

## 2016-08-03 DIAGNOSIS — M1712 Unilateral primary osteoarthritis, left knee: Secondary | ICD-10-CM | POA: Diagnosis not present

## 2016-08-15 ENCOUNTER — Other Ambulatory Visit: Payer: Self-pay | Admitting: Family Medicine

## 2016-09-07 LAB — HM DIABETES EYE EXAM

## 2016-09-28 ENCOUNTER — Other Ambulatory Visit: Payer: Self-pay | Admitting: Family Medicine

## 2016-09-29 NOTE — Telephone Encounter (Signed)
I have faxed in/thx dmf

## 2016-09-29 NOTE — Telephone Encounter (Signed)
Pt called in to check on the status of his amLODipine refill request.      Pharmacy: [id number], [place]

## 2016-10-20 DIAGNOSIS — C4441 Basal cell carcinoma of skin of scalp and neck: Secondary | ICD-10-CM | POA: Diagnosis not present

## 2016-10-20 DIAGNOSIS — C44519 Basal cell carcinoma of skin of other part of trunk: Secondary | ICD-10-CM | POA: Diagnosis not present

## 2016-10-20 DIAGNOSIS — L570 Actinic keratosis: Secondary | ICD-10-CM | POA: Diagnosis not present

## 2016-10-20 DIAGNOSIS — C44229 Squamous cell carcinoma of skin of left ear and external auricular canal: Secondary | ICD-10-CM | POA: Diagnosis not present

## 2016-10-20 DIAGNOSIS — D485 Neoplasm of uncertain behavior of skin: Secondary | ICD-10-CM | POA: Diagnosis not present

## 2016-10-21 ENCOUNTER — Ambulatory Visit: Payer: Medicare Other | Admitting: Endocrinology

## 2016-10-24 ENCOUNTER — Encounter: Payer: Self-pay | Admitting: Endocrinology

## 2016-10-24 ENCOUNTER — Ambulatory Visit (INDEPENDENT_AMBULATORY_CARE_PROVIDER_SITE_OTHER): Payer: Medicare Other | Admitting: Endocrinology

## 2016-10-24 VITALS — BP 108/70 | HR 60 | Wt 192.4 lb

## 2016-10-24 DIAGNOSIS — IMO0002 Reserved for concepts with insufficient information to code with codable children: Secondary | ICD-10-CM

## 2016-10-24 DIAGNOSIS — E1165 Type 2 diabetes mellitus with hyperglycemia: Secondary | ICD-10-CM | POA: Diagnosis not present

## 2016-10-24 DIAGNOSIS — E1142 Type 2 diabetes mellitus with diabetic polyneuropathy: Secondary | ICD-10-CM

## 2016-10-24 LAB — POCT GLYCOSYLATED HEMOGLOBIN (HGB A1C): Hemoglobin A1C: 6.2

## 2016-10-24 MED ORDER — METFORMIN HCL ER 750 MG PO TB24: 750.0000 mg | ORAL_TABLET | Freq: Two times a day (BID) | ORAL | 3 refills | Status: DC

## 2016-10-24 NOTE — Patient Instructions (Signed)
Please continue the same medications.   Please come back for a follow-up appointment in 6 months.   check your blood sugar once a day.  vary the time of day when you check, between before the 3 meals, and at bedtime.  also check if you have symptoms of your blood sugar being too high or too low.  please keep a record of the readings and bring it to your next appointment here (or you can bring the meter itself).  You can write it on any piece of paper.  please call us sooner if your blood sugar goes below 70, or if you have a lot of readings over 200.   

## 2016-10-24 NOTE — Progress Notes (Signed)
Subjective:    Patient ID: Bruce Romero, male    DOB: 1945/03/07, 71 y.o.   MRN: [id number]  HPI Pt returns for f/u of diabetes mellitus: DM type: 2 Dx'ed: 0175 Complications: polyneuropathy Therapy: trulicity and metformin.   DKA: never Severe hypoglycemia: never. Pancreatitis: never Other: he has never been on insulin; he says he cannot afford to add another name brand medication. Interval history: nausea is resolved.  He says cbg's are well-controlled.  Past Medical History:  Diagnosis Date  . Acute medial meniscal tear, left, initial encounter   . Anxiety   . Diabetes mellitus   . GERD (gastroesophageal reflux disease)   . Hyperlipidemia   . Hypertension   . Irritable bowel   . Spastic colon    anaphylaxis due to [place]    Past Surgical History:  Procedure Laterality Date  . CHOLECYSTECTOMY    . CHONDROPLASTY Left 01/15/2016   Procedure: CHONDROPLASTY;  Surgeon: Melrose Nakayama, MD;  Location: [place];  Service: Orthopedics;  Laterality: Left;  . KNEE ARTHROSCOPY WITH MEDIAL MENISECTOMY Left 01/15/2016   Procedure: ARTHROSCOPY LEFT KNEE WITH PARTIAL MEDIAL MENISECTOMY;  Surgeon: Melrose Nakayama, MD;  Location: [place];  Service: Orthopedics;  Laterality: Left;  . NASAL SINUS SURGERY      Social History   Social History  . Marital status: Married    Spouse name: N/A  . Number of children: N/A  . Years of education: N/A   Occupational History  . retired    Social History Main Topics  . Smoking status: Former Research scientist (life sciences)  . Smokeless tobacco: Never Used  . Alcohol use 0.0 oz/week     Comment: social  . Drug use: No  . Sexual activity: Not on file   Other Topics Concern  . Not on file   Social History Narrative   Exercise try to everyday.    Current Outpatient Prescriptions on File Prior to Visit  Medication Sig Dispense Refill  . albuterol (PROAIR HFA) 108 (90 BASE) MCG/ACT inhaler Inhale 2 puffs into the lungs  every 6 (six) hours as needed for wheezing. 1 Inhaler 2  . ALPRAZolam (XANAX) 0.25 MG tablet take 1 tablet by mouth three times a day if needed 30 tablet 0  . amLODipine (NORVASC) 10 MG tablet take 1 tablet by mouth once daily 90 tablet 1  . aspirin 81 MG tablet Take 81 mg by mouth daily.    Marland Kitchen atorvastatin (LIPITOR) 10 MG tablet take 1 tablet by mouth once daily 90 tablet 1  . Blood Glucose Monitoring Suppl (ONE TOUCH ULTRA 2) w/Device KIT See admin instructions.  0  . candesartan (ATACAND) 16 MG tablet take 1 tablet by mouth at bedtime 90 tablet 1  . Cholecalciferol (VITAMIN D) 2000 UNITS tablet Take 2,000 Units by mouth daily.      Marland Kitchen co-enzyme Q-10 30 MG capsule Take 100 mg by mouth daily.    Marland Kitchen dicyclomine (BENTYL) 20 MG tablet TAKE 1 TABLET BY MOUTH EVERY 6 HOURS 60 tablet 1  . Dulaglutide (TRULICITY) 1.5 ZW/2.5EN SOPN 1.5 mg weekly 4 pen 3  . esomeprazole (NEXIUM) 40 MG capsule Take 20 mg by mouth daily.    . fluticasone (FLONASE) 50 MCG/ACT nasal spray Place into both nostrils daily.    . Glucosamine Sulfate 1000 MG TABS Take 1,000 tablets by mouth 2 (two) times daily.    Marland Kitchen glucose blood (ONE TOUCH ULTRA TEST) test strip Check blood sugar 2 times daily.  Dx: E11.9 100 each 3  . Insulin Pen Needle 32G X 6 MM MISC Use as directed with insulin pen 30 each 3  . linaclotide (LINZESS) 290 MCG CAPS capsule Take 1 capsule (290 mcg total) by mouth daily before breakfast. 30 capsule 5  . MAGNESIUM GLYCINATE PLUS PO Take 400 tablets by mouth 2 (two) times daily.    . Melatonin 3 MG TABS Take 3 tablets by mouth as needed.    . moxifloxacin (VIGAMOX) 0.5 % ophthalmic solution Place 1 drop into both eyes 3 (three) times daily. 3 mL 0  . niacin (SLO-NIACIN) 500 MG tablet Take 500 mg by mouth at bedtime.    . [DISCONTINUED] citalopram (CELEXA) 10 MG tablet Take 1 tablet (10 mg total) by mouth daily. 90 tablet 0   No current facility-administered medications on file prior to visit.     Allergies    Allergen Reactions  . Cephalexin Anaphylaxis    REACTION: pt is not allergic to penicillin  . Bactrim [Sulfamethoxazole-Trimethoprim] Other (See Comments)    ? Took Bactrim and Flexeril at same time had lip swelling unsure which caused reaction.  Yvette Rack [Cyclobenzaprine] Swelling    Family History  Problem Relation Age of Onset  . Stroke Mother   . Pulmonary embolism Father   . Diabetes Brother     BP 108/70   Pulse 60   Wt 192 lb 6.4 oz (87.3 kg)   SpO2 96%   BMI 27.61 kg/m    Review of Systems He denies hypoglycemia.     Objective:   Physical Exam VITAL SIGNS:  See vs page.  GENERAL: no distress.  Pulses: foot pulses are intact bilaterally.   MSK: no deformity of the feet or ankles.  CV: no edema of the legs or ankles.  Skin:  no ulcer on the feet or ankles.  normal color and temp on the feet and ankles.  Neuro: sensation is intact to touch on the feet and ankles.       Assessment & Plan:  Type 2 DM: well-controlled.  Nausea: poss due to meds, better.  We'll follow.   Patient Instructions  Please continue the same medications Please come back for a follow-up appointment in 6 months check your blood sugar once a day.  vary the time of day when you check, between before the 3 meals, and at bedtime.  also check if you have symptoms of your blood sugar being too high or too low.  please keep a record of the readings and bring it to your next appointment here (or you can bring the meter itself).  You can write it on any piece of paper.  please call us sooner if your blood sugar goes below 70, or if you have a lot of readings over 200.

## 2016-10-28 ENCOUNTER — Ambulatory Visit: Payer: Medicare Other | Admitting: Endocrinology

## 2016-11-18 ENCOUNTER — Other Ambulatory Visit: Payer: Self-pay | Admitting: Family Medicine

## 2016-11-18 DIAGNOSIS — D0439 Carcinoma in situ of skin of other parts of face: Secondary | ICD-10-CM | POA: Insufficient documentation

## 2016-11-18 DIAGNOSIS — C44519 Basal cell carcinoma of skin of other part of trunk: Secondary | ICD-10-CM | POA: Diagnosis not present

## 2016-11-18 DIAGNOSIS — D485 Neoplasm of uncertain behavior of skin: Secondary | ICD-10-CM | POA: Diagnosis not present

## 2016-11-18 DIAGNOSIS — C4441 Basal cell carcinoma of skin of scalp and neck: Secondary | ICD-10-CM | POA: Diagnosis not present

## 2016-11-18 DIAGNOSIS — K589 Irritable bowel syndrome without diarrhea: Secondary | ICD-10-CM

## 2016-11-18 DIAGNOSIS — C44329 Squamous cell carcinoma of skin of other parts of face: Secondary | ICD-10-CM | POA: Diagnosis not present

## 2016-11-22 DIAGNOSIS — C44329 Squamous cell carcinoma of skin of other parts of face: Secondary | ICD-10-CM | POA: Diagnosis not present

## 2016-12-06 ENCOUNTER — Encounter: Payer: Self-pay | Admitting: Family Medicine

## 2016-12-08 ENCOUNTER — Encounter: Payer: Self-pay | Admitting: Medical

## 2016-12-08 ENCOUNTER — Telehealth: Payer: Self-pay | Admitting: Medical

## 2016-12-08 ENCOUNTER — Ambulatory Visit (INDEPENDENT_AMBULATORY_CARE_PROVIDER_SITE_OTHER): Payer: Medicare Other | Admitting: Medical

## 2016-12-08 ENCOUNTER — Other Ambulatory Visit: Payer: Self-pay | Admitting: Family Medicine

## 2016-12-08 VITALS — BP 143/74 | HR 99 | Temp 97.6°F | Resp 16 | Ht 69.0 in | Wt 191.8 lb

## 2016-12-08 DIAGNOSIS — I10 Essential (primary) hypertension: Secondary | ICD-10-CM

## 2016-12-08 DIAGNOSIS — J0100 Acute maxillary sinusitis, unspecified: Secondary | ICD-10-CM

## 2016-12-08 DIAGNOSIS — E785 Hyperlipidemia, unspecified: Secondary | ICD-10-CM

## 2016-12-08 MED ORDER — AMOXICILLIN-POT CLAVULANATE 875-125 MG PO TABS: 1.0000 | ORAL_TABLET | Freq: Two times a day (BID) | ORAL | 0 refills | Status: DC

## 2016-12-08 MED ORDER — HYDROCOD POLST-CPM POLST ER 10-8 MG/5ML PO SUER: 5.0000 mL | Freq: Two times a day (BID) | ORAL | 0 refills | Status: DC | PRN

## 2016-12-08 NOTE — Telephone Encounter (Signed)
Labs in

## 2016-12-08 NOTE — Telephone Encounter (Signed)
Dr. Etter Sjogren,  Pt states he was told to recheck his labs in 6 months and he wants orders placed. He states should be labs already placed but I don't see them. Not sure what you want ordered. I would think cmp and lipid panel based on his dx but not sure. He states a1c done by endocrinologist.  He will come in for labs in November towards end  Thanks, Susan Bleich

## 2016-12-08 NOTE — Patient Instructions (Addendum)
You appear to have a sinus infection. I am prescribing augmentin antibiotic for the infection. To help with the nasal congestion use flonase nasal steroid. For your associated cough, I prescribed cough medicine tussionex  Rest, hydrate, tylenol for fever.  Chest sounds clear today but if worse chest congestion symptoms then recommend cxr  Follow up in 7 days or as needed.

## 2016-12-08 NOTE — Progress Notes (Signed)
Subjective:    Patient ID: Bruce Romero, male    DOB: 03/26/1945, 71 y.o.   MRN: [id number]  HPI  Pt in for 2 weeks of nasal congestion. Some sinus pressure now recently more on rt side. Feels pnd and some chest congestion.  No fever, no chills or sweats.  Some colored mucous when he blow his nose. Using flonase and mucinex.  Hx of sinus infection in fall. He has cough. Cough keeps him up some at night.  Pt got flu vaccine first of October. 1st shingrix as well ealry October.  Last year pt used augmentin last year and no reaction.    Review of Systems  Constitutional: Negative for chills, fatigue and fever.  HENT: Positive for congestion, sinus pain and sinus pressure. Negative for postnasal drip, sneezing, tinnitus and trouble swallowing.   Respiratory: Negative for cough, chest tightness, shortness of breath and wheezing.   Cardiovascular: Negative for chest pain and palpitations.  Gastrointestinal: Negative for abdominal pain, nausea and vomiting.  Musculoskeletal: Negative for back pain and myalgias.  Skin: Negative for rash.  Hematological: Negative for adenopathy. Does not bruise/bleed easily.  Psychiatric/Behavioral: Negative for behavioral problems and confusion.    Past Medical History:  Diagnosis Date  . Acute medial meniscal tear, left, initial encounter   . Anxiety   . Diabetes mellitus   . GERD (gastroesophageal reflux disease)   . Hyperlipidemia   . Hypertension   . Irritable bowel   . Spastic colon    anaphylaxis due to Kef[place] 1988     Social History   Social History  . Marital status: Married    Spouse name: N/A  . Number of children: N/A  . Years of education: N/A   Occupational History  . retired    Social History Main Topics  . Smoking status: Former Research scientist (life sciences)  . Smokeless tobacco: Never Used  . Alcohol use 0.0 oz/week     Comment: social  . Drug use: No  . Sexual activity: Not on file   Other Topics Concern  . Not on file    Social History Narrative   Exercise try to everyday.    Past Surgical History:  Procedure Laterality Date  . CHOLECYSTECTOMY    . CHONDROPLASTY Left 01/15/2016   Procedure: CHONDROPLASTY;  Surgeon: Melrose Nakayama, MD;  Location: [facility];  Service: Orthopedics;  Laterality: Left;  . KNEE ARTHROSCOPY WITH MEDIAL MENISECTOMY Left 01/15/2016   Procedure: ARTHROSCOPY LEFT KNEE WITH PARTIAL MEDIAL MENISECTOMY;  Surgeon: Melrose Nakayama, MD;  Location: [place];  Service: Orthopedics;  Laterality: Left;  . NASAL SINUS SURGERY      Family History  Problem Relation Age of Onset  . Stroke Mother   . Pulmonary embolism Father   . Diabetes Brother     Allergies  Allergen Reactions  . Cephalexin Anaphylaxis    REACTION: pt is not allergic to penicillin  . Bactrim [Sulfamethoxazole-Trimethoprim] Other (See Comments)    ? Took Bactrim and Flexeril at same time had lip swelling unsure which caused reaction.  Yvette Rack [Cyclobenzaprine] Swelling    Current Outpatient Prescriptions on File Prior to Visit  Medication Sig Dispense Refill  . albuterol (PROAIR HFA) 108 (90 BASE) MCG/ACT inhaler Inhale 2 puffs into the lungs every 6 (six) hours as needed for wheezing. 1 Inhaler 2  . ALPRAZolam (XANAX) 0.25 MG tablet take 1 tablet by mouth three times a day if needed 30 tablet 0  . amLODipine (NORVASC) 10  MG tablet take 1 tablet by mouth once daily 90 tablet 1  . aspirin 81 MG tablet Take 81 mg by mouth daily.    Marland Kitchen atorvastatin (LIPITOR) 10 MG tablet take 1 tablet by mouth once daily 90 tablet 1  . Blood Glucose Monitoring Suppl (ONE TOUCH ULTRA 2) w/Device KIT See admin instructions.  0  . candesartan (ATACAND) 16 MG tablet take 1 tablet by mouth at bedtime 90 tablet 1  . Cholecalciferol (VITAMIN D) 2000 UNITS tablet Take 2,000 Units by mouth daily.      Marland Kitchen co-enzyme Q-10 30 MG capsule Take 100 mg by mouth daily.    Marland Kitchen dicyclomine (BENTYL) 20 MG tablet TAKE 1  TABLET BY MOUTH EVERY 6 HOURS 60 tablet 1  . Dulaglutide (TRULICITY) 1.5 RN/1.6FB SOPN 1.5 mg weekly 4 pen 3  . esomeprazole (NEXIUM) 40 MG capsule Take 20 mg by mouth daily.    . fluticasone (FLONASE) 50 MCG/ACT nasal spray Place into both nostrils daily.    . Glucosamine Sulfate 1000 MG TABS Take 1,000 tablets by mouth 2 (two) times daily.    Marland Kitchen glucose blood (ONE TOUCH ULTRA TEST) test strip Check blood sugar 2 times daily. Dx: E11.9 100 each 3  . Insulin Pen Needle 32G X 6 MM MISC Use as directed with insulin pen 30 each 3  . LINZESS 290 MCG CAPS capsule take 1 capsule by mouth once daily BEFORE BREAKFAST 30 capsule 5  . MAGNESIUM GLYCINATE PLUS PO Take 400 tablets by mouth 2 (two) times daily.    . Melatonin 3 MG TABS Take 3 tablets by mouth as needed.    . metFORMIN (GLUCOPHAGE-XR) 750 MG 24 hr tablet Take 1 tablet (750 mg total) by mouth 2 (two) times daily with a meal. 180 tablet 3  . moxifloxacin (VIGAMOX) 0.5 % ophthalmic solution Place 1 drop into both eyes 3 (three) times daily. 3 mL 0  . niacin (SLO-NIACIN) 500 MG tablet Take 500 mg by mouth at bedtime.    . [DISCONTINUED] citalopram (CELEXA) 10 MG tablet Take 1 tablet (10 mg total) by mouth daily. 90 tablet 0   No current facility-administered medications on file prior to visit.     BP (!) 143/74   Pulse 99   Temp 97.6 F (36.4 C) (Oral)   Resp 16   Ht '5\' 9"'  (1.753 m)   Wt 191 lb 12.8 oz (87 kg)   SpO2 100%   BMI 28.32 kg/m       Objective:   Physical Exam  General  Mental Status - Alert. General Appearance - Well groomed. Not in acute distress.  Skin Rashes- No Rashes.  HEENT Head- Normal. Ear Auditory Canal - Left- Normal. Right - Normal.Tympanic Membrane- Left- Normal. Right- Normal. Eye Sclera/Conjunctiva- Left- Normal. Right- Normal. Nose & Sinuses Nasal Mucosa- Left-  Boggy and Congested. Right-  Boggy and  Congested.Rt side maxillary pain on palpation but no frontal sinus pressure. Mouth &  Throat Lips: Upper Lip- Normal: no dryness, cracking, pallor, cyanosis, or vesicular eruption. Lower Lip-Normal: no dryness, cracking, pallor, cyanosis or vesicular eruption. Buccal Mucosa- Bilateral- No Aphthous ulcers. Oropharynx- No Discharge or Erythema. Tonsils: Characteristics- Bilateral- No Erythema or Congestion. Size/Enlargement- Bilateral- No enlargement. Discharge- bilateral-None.  Neck Neck- Supple. No Masses.   Chest and Lung Exam Auscultation: Breath Sounds:-Clear even and unlabored.  Cardiovascular Auscultation:Rythm- Regular, rate and rhythm. Murmurs & Other Heart Sounds:Ausculatation of the heart reveal- No Murmurs.  Lymphatic Head & Neck General Head &  Neck Lymphatics: Bilateral: Description- No Localized lymphadenopathy.       Assessment & Plan:  You appear to have a sinus infection. I am prescribing augmentin antibiotic for the infection. To help with the nasal congestion use flonase nasal steroid. For your associated cough, I prescribed cough medicine tussionex  Rest, hydrate, tylenol for fever.  Chest sounds clear today but if worse chest congestion symptoms then recommend cxr  Follow up in 7 days or as needed.

## 2016-12-15 ENCOUNTER — Other Ambulatory Visit: Payer: Self-pay | Admitting: Family Medicine

## 2016-12-15 ENCOUNTER — Ambulatory Visit: Payer: Medicare Other | Admitting: Family Medicine

## 2016-12-15 ENCOUNTER — Encounter: Payer: Self-pay | Admitting: Family Medicine

## 2016-12-15 VITALS — BP 110/62 | HR 71 | Temp 98.0°F | Ht 70.0 in | Wt 192.0 lb

## 2016-12-15 DIAGNOSIS — J40 Bronchitis, not specified as acute or chronic: Secondary | ICD-10-CM

## 2016-12-15 DIAGNOSIS — J9801 Acute bronchospasm: Secondary | ICD-10-CM

## 2016-12-15 MED ORDER — PREDNISONE 20 MG PO TABS: 40.0000 mg | ORAL_TABLET | Freq: Every day | ORAL | 0 refills | Status: AC

## 2016-12-15 MED ORDER — ALBUTEROL SULFATE HFA 108 (90 BASE) MCG/ACT IN AERS: 2.0000 | INHALATION_SPRAY | Freq: Four times a day (QID) | RESPIRATORY_TRACT | 0 refills | Status: DC | PRN

## 2016-12-15 NOTE — Progress Notes (Signed)
Chief Complaint  Patient presents with  . Wheezing    congestion    Bruce Romero here for URI complaints.  Duration: 2 weeks Associated symptoms: sinus congestion, rhinorrhea, wheezing, chest tightness and productive cough Denies: sinus pain, itchy watery eyes, ear pain, ear drainage, sore throat, shortness of breath, myalgia and fevers/rigors Treatment to date: Augmentin (on d7/10), albuterol Sick contacts: No  ROS:  Const: Denies fevers HEENT: As noted in HPI Lungs: No SOB  Past Medical History:  Diagnosis Date  . Acute medial meniscal tear, left, initial encounter   . Anxiety   . Diabetes mellitus   . GERD (gastroesophageal reflux disease)   . Hyperlipidemia   . Hypertension   . Irritable bowel   . Spastic colon    anaphylaxis due to Keflex 1988   Family History  Problem Relation Age of Onset  . Stroke Mother   . Pulmonary embolism Father   . Diabetes Brother     BP 110/62 (BP Location: Left Arm, Patient Position: Sitting, Cuff Size: Large)   Pulse 71   Temp 98 F (36.7 C) (Oral)   Ht 5\' 10"  (1.778 m)   Wt 192 lb (87.1 kg)   SpO2 98%   BMI 27.55 kg/m  General: Awake, alert, appears stated age HEENT: AT, [place], ears patent b/l and TM's neg, nares patent w/o discharge, pharynx pink and without exudates, MMM Neck: No masses or asymmetry Heart: RRR, no murmurs, no bruits Lungs: faint wheezes, no accessory muscle use Psych: Age appropriate judgment and insight, normal mood and affect  Wheezy bronchitis  Bronchospasm - Plan: albuterol (PROAIR HFA) 108 (90 Base) MCG/ACT inhaler  Orders as above. Cont albuterol as needed. Steroids. Discussed that he is on a pretty strong abx for URI infections, this is likely viral.  Continue to push fluids, practice good hand hygiene, cover mouth when coughing. F/u prn. If starting to experience fevers, shaking, or shortness of breath, seek immediate care. Pt voiced understanding and agreement to the plan.  [place], DO 12/15/16 9:02 AM

## 2016-12-15 NOTE — Patient Instructions (Signed)
Continue to push fluids, practice good hand hygiene, and cover your mouth if you cough.  If you start having fevers, shaking or shortness of breath, seek immediate care.  Let us know if you need anything.  

## 2016-12-15 NOTE — Progress Notes (Signed)
Pre visit review using our clinic review tool, if applicable. No additional management support is needed unless otherwise documented below in the visit note. 

## 2017-01-02 DIAGNOSIS — C44329 Squamous cell carcinoma of skin of other parts of face: Secondary | ICD-10-CM | POA: Diagnosis not present

## 2017-01-06 ENCOUNTER — Other Ambulatory Visit (INDEPENDENT_AMBULATORY_CARE_PROVIDER_SITE_OTHER): Payer: Medicare Other

## 2017-01-06 DIAGNOSIS — I10 Essential (primary) hypertension: Secondary | ICD-10-CM

## 2017-01-06 DIAGNOSIS — E785 Hyperlipidemia, unspecified: Secondary | ICD-10-CM | POA: Diagnosis not present

## 2017-01-06 LAB — LIPID PANEL
CHOLESTEROL: 114 mg/dL (ref 0–200)
HDL: 55.7 mg/dL (ref >39.00)
LDL Cholesterol: 38 mg/dL (ref 0–99)
NonHDL: 58.58
TRIGLYCERIDES: 104 mg/dL (ref 0.0–149.0)
Total CHOL/HDL Ratio: 2
VLDL: 20.8 mg/dL (ref 0.0–40.0)

## 2017-01-06 LAB — COMPREHENSIVE METABOLIC PANEL
ALBUMIN: 4.1 g/dL (ref 3.5–5.2)
ALT: 20 U/L (ref 0–53)
AST: 25 U/L (ref 0–37)
Alkaline Phosphatase: 43 U/L (ref 39–117)
BILIRUBIN TOTAL: 0.9 mg/dL (ref 0.2–1.2)
BUN: 15 mg/dL (ref 6–23)
CALCIUM: 9.1 mg/dL (ref 8.4–10.5)
CHLORIDE: 99 meq/L (ref 96–112)
CO2: 31 mEq/L (ref 19–32)
CREATININE: 0.93 mg/dL (ref 0.40–1.50)
GFR: 84.78 mL/min (ref >60.00)
Glucose, Bld: 129 mg/dL — ABNORMAL HIGH (ref 70–99)
Potassium: 4 mEq/L (ref 3.5–5.1)
Sodium: 137 mEq/L (ref 135–145)
Total Protein: 6.9 g/dL (ref 6.0–8.3)

## 2017-01-17 ENCOUNTER — Other Ambulatory Visit: Payer: Self-pay | Admitting: Family Medicine

## 2017-01-18 DIAGNOSIS — L570 Actinic keratosis: Secondary | ICD-10-CM | POA: Diagnosis not present

## 2017-01-18 DIAGNOSIS — Z85828 Personal history of other malignant neoplasm of skin: Secondary | ICD-10-CM | POA: Diagnosis not present

## 2017-01-18 DIAGNOSIS — L905 Scar conditions and fibrosis of skin: Secondary | ICD-10-CM | POA: Diagnosis not present

## 2017-01-19 DIAGNOSIS — D0422 Carcinoma in situ of skin of left ear and external auricular canal: Secondary | ICD-10-CM | POA: Diagnosis not present

## 2017-01-21 ENCOUNTER — Other Ambulatory Visit: Payer: Self-pay | Admitting: Family Medicine

## 2017-02-04 ENCOUNTER — Other Ambulatory Visit: Payer: Self-pay | Admitting: Family Medicine

## 2017-02-20 DIAGNOSIS — N411 Chronic prostatitis: Secondary | ICD-10-CM | POA: Diagnosis not present

## 2017-03-20 ENCOUNTER — Encounter: Payer: Self-pay | Admitting: Family Medicine

## 2017-03-20 ENCOUNTER — Ambulatory Visit: Payer: Medicare Other | Admitting: Family Medicine

## 2017-03-20 VITALS — BP 148/82 | HR 93 | Temp 98.0°F | Resp 16 | Ht 70.0 in | Wt 196.8 lb

## 2017-03-20 DIAGNOSIS — R059 Cough, unspecified: Secondary | ICD-10-CM

## 2017-03-20 DIAGNOSIS — R05 Cough: Secondary | ICD-10-CM | POA: Diagnosis not present

## 2017-03-20 DIAGNOSIS — J301 Allergic rhinitis due to pollen: Secondary | ICD-10-CM | POA: Diagnosis not present

## 2017-03-20 LAB — POCT INFLUENZA A/B
INFLUENZA A, POC: NEGATIVE
INFLUENZA B, POC: NEGATIVE

## 2017-03-20 MED ORDER — PREDNISONE 20 MG PO TABS: 40.0000 mg | ORAL_TABLET | Freq: Every day | ORAL | 0 refills | Status: DC

## 2017-03-20 MED ORDER — OSELTAMIVIR PHOSPHATE 75 MG PO CAPS: 75.0000 mg | ORAL_CAPSULE | Freq: Two times a day (BID) | ORAL | 0 refills | Status: DC

## 2017-03-20 NOTE — Assessment & Plan Note (Signed)
con't current allergy meds pred for 5 days  Call or rto prn

## 2017-03-20 NOTE — Patient Instructions (Signed)

## 2017-03-20 NOTE — Progress Notes (Signed)
mPatient ID: [name], male   DOB: [birth year], 72 y.o.   MRN: [id number]    Subjective:  I acted as a Education administrator for Dr. Carollee Herter.  Guerry Bruin, [place]   Patient ID: NAMAN SPYCHALSKI, male    DOB: [birth year], 72 y.o.   MRN: [id number]  Chief Complaint  Patient presents with  . Cough    HPI  Patient is in today for cough, runny nose, thinks he have flu or sinus issues.  Symptoms started on last Thursday.  Has taken mucinex, inhaler, and advil.  Patient Care Team: Carollee Herter, Alferd Apa, DO as PCP - General Bjorn Loser, MD as Consulting Physician (Urology) Renato Shin, MD as Consulting Physician (Endocrinology) Druscilla Brownie, MD as Consulting Physician (Dermatology) Lorretta Harp, MD as Consulting Physician (Cardiology) Melrose Nakayama, MD as Consulting Physician (Orthopedic Surgery) Christain Sacramento, Noel as Consulting Physician (Optometry) Rip Harbour, DDS as Consulting Physician (Dentistry)   Past Medical History:  Diagnosis Date  . Acute medial meniscal tear, left, initial encounter   . Anxiety   . Diabetes mellitus   . GERD (gastroesophageal reflux disease)   . Hyperlipidemia   . Hypertension   . Irritable bowel   . Spastic colon    anaphylaxis due to [place]    Past Surgical History:  Procedure Laterality Date  . CHOLECYSTECTOMY    . CHONDROPLASTY Left 01/15/2016   Procedure: CHONDROPLASTY;  Surgeon: Melrose Nakayama, MD;  Location: [facility];  Service: Orthopedics;  Laterality: Left;  . KNEE ARTHROSCOPY WITH MEDIAL MENISECTOMY Left 01/15/2016   Procedure: ARTHROSCOPY LEFT KNEE WITH PARTIAL MEDIAL MENISECTOMY;  Surgeon: Melrose Nakayama, MD;  Location: [facility];  Service: Orthopedics;  Laterality: Left;  . NASAL SINUS SURGERY      Family History  Problem Relation Age of Onset  . Stroke Mother   . Pulmonary embolism Father   . Diabetes Brother     Social History   Socioeconomic History  . Marital status: Married   Spouse name: Not on file  . Number of children: Not on file  . Years of education: Not on file  . Highest education level: Not on file  Social Needs  . Financial resource strain: Not on file  . Food insecurity - worry: Not on file  . Food insecurity - inability: Not on file  . Transportation needs - medical: Not on file  . Transportation needs - non-medical: Not on file  Occupational History  . Occupation: retired  Tobacco Use  . Smoking status: Former Research scientist (life sciences)  . Smokeless tobacco: Never Used  Substance and Sexual Activity  . Alcohol use: Yes    Alcohol/week: 0.0 oz    Comment: social  . Drug use: No  . Sexual activity: Not on file  Other Topics Concern  . Not on file  Social History Narrative   Exercise try to everyday.    Outpatient Medications Prior to Visit  Medication Sig Dispense Refill  . albuterol (PROAIR HFA) 108 (90 Base) MCG/ACT inhaler Inhale 2 puffs every 6 (six) hours as needed into the lungs for wheezing. 1 Inhaler 0  . ALPRAZolam (XANAX) 0.25 MG tablet take 1 tablet by mouth three times a day if needed 30 tablet 0  . amLODipine (NORVASC) 10 MG tablet take 1 tablet by mouth once daily 90 tablet 1  . atorvastatin (LIPITOR) 10 MG tablet take 1 tablet by mouth once daily 90 tablet 1  . Blood Glucose Monitoring Suppl (ONE TOUCH ULTRA 2) w/Device  KIT See admin instructions.  0  . candesartan (ATACAND) 16 MG tablet Take 1 tablet (16 mg total) at bedtime by mouth. 90 tablet 1  . chlorpheniramine-HYDROcodone (TUSSIONEX PENNKINETIC ER) 10-8 MG/5ML SUER Take 5 mLs by mouth every 12 (twelve) hours as needed for cough. Can give generic 115 mL 0  . Cholecalciferol (VITAMIN D) 2000 UNITS tablet Take 2,000 Units by mouth daily.      Marland Kitchen co-enzyme Q-10 30 MG capsule Take 100 mg by mouth daily.    Marland Kitchen dicyclomine (BENTYL) 20 MG tablet TAKE 1 TABLET BY MOUTH EVERY 6 HOURS 60 tablet 1  . esomeprazole (NEXIUM) 40 MG capsule Take 20 mg by mouth daily.    . fluticasone (FLONASE) 50  MCG/ACT nasal spray Place into both nostrils daily.    Marland Kitchen glucose blood (ONE TOUCH ULTRA TEST) test strip CHECK BLOOD SUGAR twice a day 200 each 12  . Insulin Pen Needle 32G X 6 MM MISC Use as directed with insulin pen 30 each 3  . LINZESS 290 MCG CAPS capsule take 1 capsule by mouth once daily BEFORE BREAKFAST 30 capsule 5  . MAGNESIUM GLYCINATE PLUS PO Take 400 tablets by mouth 2 (two) times daily.    . metFORMIN (GLUCOPHAGE-XR) 750 MG 24 hr tablet Take 1 tablet (750 mg total) by mouth 2 (two) times daily with a meal. 180 tablet 3  . moxifloxacin (VIGAMOX) 0.5 % ophthalmic solution Place 1 drop into both eyes 3 (three) times daily. 3 mL 0  . niacin (SLO-NIACIN) 500 MG tablet Take 500 mg by mouth at bedtime.    . TRULICITY 1.5 BC/4.8GQ SOPN INJECT 1 SYRINGE WEEKLY AS DIRECTED 2 mL 3  . amoxicillin-clavulanate (AUGMENTIN) 875-125 MG tablet Take 1 tablet by mouth 2 (two) times daily. 20 tablet 0  . aspirin 81 MG tablet Take 81 mg by mouth daily.    . Glucosamine Sulfate 1000 MG TABS Take 1,000 tablets by mouth 2 (two) times daily.    . Melatonin 3 MG TABS Take 3 tablets by mouth as needed.     No facility-administered medications prior to visit.     Allergies  Allergen Reactions  . Cephalexin Anaphylaxis    REACTION: pt is not allergic to penicillin  . Bactrim [Sulfamethoxazole-Trimethoprim] Other (See Comments)    ? Took Bactrim and Flexeril at same time had lip swelling unsure which caused reaction.  Yvette Rack [Cyclobenzaprine] Swelling    Review of Systems  Constitutional: Positive for chills.  HENT: Positive for congestion. Negative for ear discharge, ear pain, sinus pain and sore throat.        Runny nose Watery eyes  Respiratory: Positive for cough. Negative for wheezing.        Objective:    Physical Exam  Constitutional: He is oriented to person, place, and time. He appears well-developed and well-nourished.  HENT:  Right Ear: External ear normal.  Left Ear: External  ear normal.  Nose: Rhinorrhea present.  Mouth/Throat: Posterior oropharyngeal erythema present.  + PND + errythema  Eyes: Conjunctivae are normal. Right eye exhibits no discharge. Left eye exhibits no discharge.  Cardiovascular: Normal rate, regular rhythm and normal heart sounds.  No murmur heard. Pulmonary/Chest: Effort normal and breath sounds normal. No respiratory distress. He has no wheezes. He has no rales. He exhibits no tenderness.  Musculoskeletal: He exhibits no edema.  Lymphadenopathy:    He has cervical adenopathy.  Neurological: He is alert and oriented to person, place, and time.  Nursing note  and vitals reviewed.   BP (!) 148/82 (BP Location: Left Arm, Cuff Size: Normal)   Pulse 93   Temp 98 F (36.7 C) (Oral)   Resp 16   Ht '5\' 10"'$  (1.778 m)   Wt 196 lb 12.8 oz (89.3 kg)   SpO2 97%   BMI 28.24 kg/m  Wt Readings from Last 3 Encounters:  03/20/17 196 lb 12.8 oz (89.3 kg)  12/15/16 192 lb (87.1 kg)  12/08/16 191 lb 12.8 oz (87 kg)   BP Readings from Last 3 Encounters:  03/20/17 (!) 148/82  12/15/16 110/62  12/08/16 (!) 143/74     Immunization History  Administered Date(s) Administered  . Influenza Split 01/04/2011, 11/21/2011  . Influenza Whole 01/07/1999  . Influenza, High Dose Seasonal PF 10/15/2015  . Influenza,inj,Quad PF,6+ Mos 11/29/2012, 11/18/2013, 02/10/2015  . Pneumococcal Conjugate-13 01/21/2014  . Pneumococcal Polysaccharide-23 04/08/2015  . Td 05/03/2004  . Tdap 05/09/2014  . Zoster 05/13/2008    Health Maintenance  Topic Date Due  . INFLUENZA VACCINE  09/07/2016  . HEMOGLOBIN A1C  04/23/2017  . OPHTHALMOLOGY EXAM  09/07/2017  . FOOT EXAM  10/24/2017  . COLONOSCOPY  06/27/2020  . TETANUS/TDAP  05/08/2024  . Hepatitis C Screening  Completed  . PNA vac Low Risk Adult  Completed    Lab Results  Component Value Date   WBC 8.3 06/27/2016   HGB 14.8 06/27/2016   HCT 43.3 06/27/2016   PLT 210.0 06/27/2016   GLUCOSE 129 (H)  01/06/2017   CHOL 114 01/06/2017   TRIG 104.0 01/06/2017   HDL 55.70 01/06/2017   LDLDIRECT 45.0 06/27/2016   LDLCALC 38 01/06/2017   ALT 20 01/06/2017   AST 25 01/06/2017   NA 137 01/06/2017   K 4.0 01/06/2017   CL 99 01/06/2017   CREATININE 0.93 01/06/2017   BUN 15 01/06/2017   CO2 31 01/06/2017   TSH 1.61 06/27/2016   PSA 0.60 06/27/2016   HGBA1C 6.2 10/24/2016   MICROALBUR <0.7 06/27/2016    Lab Results  Component Value Date   TSH 1.61 06/27/2016   Lab Results  Component Value Date   WBC 8.3 06/27/2016   HGB 14.8 06/27/2016   HCT 43.3 06/27/2016   MCV 87.3 06/27/2016   PLT 210.0 06/27/2016   Lab Results  Component Value Date   NA 137 01/06/2017   K 4.0 01/06/2017   CO2 31 01/06/2017   GLUCOSE 129 (H) 01/06/2017   BUN 15 01/06/2017   CREATININE 0.93 01/06/2017   BILITOT 0.9 01/06/2017   ALKPHOS 43 01/06/2017   AST 25 01/06/2017   ALT 20 01/06/2017   PROT 6.9 01/06/2017   ALBUMIN 4.1 01/06/2017   CALCIUM 9.1 01/06/2017   GFR 84.78 01/06/2017   Lab Results  Component Value Date   CHOL 114 01/06/2017   Lab Results  Component Value Date   HDL 55.70 01/06/2017   Lab Results  Component Value Date   LDLCALC 38 01/06/2017   Lab Results  Component Value Date   TRIG 104.0 01/06/2017   Lab Results  Component Value Date   CHOLHDL 2 01/06/2017   Lab Results  Component Value Date   HGBA1C 6.2 10/24/2016         Assessment & Plan:   Problem List Items Addressed This Visit    None    Visit Diagnoses    Cough    -  Primary   Relevant Medications   predniSONE (DELTASONE) 20 MG tablet   Other Relevant  Orders   POCT Influenza A/B (Completed)      I have discontinued Dominica Severin L. Rego "[name]"'s Melatonin, Glucosamine Sulfate, aspirin, and amoxicillin-clavulanate. I am also having him start on predniSONE and oseltamivir. Additionally, I am having him maintain his Vitamin D, esomeprazole, niacin, co-enzyme Q-10, MAGNESIUM GLYCINATE PLUS PO, ONE  TOUCH ULTRA 2, fluticasone, ALPRAZolam, Insulin Pen Needle, moxifloxacin, atorvastatin, amLODipine, metFORMIN, LINZESS, chlorpheniramine-HYDROcodone, albuterol, candesartan, dicyclomine, glucose blood, and TRULICITY.  Meds ordered this encounter  Medications  . predniSONE (DELTASONE) 20 MG tablet    Sig: Take 2 tablets (40 mg total) by mouth daily.    Dispense:  10 tablet    Refill:  0  . oseltamivir (TAMIFLU) 75 MG capsule    Sig: Take 1 capsule (75 mg total) by mouth 2 (two) times daily.    Dispense:  10 capsule    Refill:  0    CMA served as scribe during this visit. History, Physical and Plan performed by medical provider. Documentation and orders reviewed and attested to.  Ann Held, DO

## 2017-04-07 ENCOUNTER — Other Ambulatory Visit: Payer: Self-pay | Admitting: Family Medicine

## 2017-04-26 ENCOUNTER — Ambulatory Visit: Payer: Medicare Other | Admitting: Endocrinology

## 2017-04-26 ENCOUNTER — Encounter: Payer: Self-pay | Admitting: Endocrinology

## 2017-04-26 DIAGNOSIS — E119 Type 2 diabetes mellitus without complications: Secondary | ICD-10-CM | POA: Insufficient documentation

## 2017-04-26 LAB — HEMOGLOBIN A1C: HEMOGLOBIN A1C: 6.9 % — AB (ref 4.6–6.5)

## 2017-04-26 MED ORDER — GLUCOSE BLOOD VI STRP: 1.0000 | ORAL_STRIP | Freq: Every day | 12 refills | Status: DC

## 2017-04-26 NOTE — Patient Instructions (Addendum)
A diabetes blood test is requested for you today.  We'll let you know about the results. Please come back for a follow-up appointment in 6 months.   check your blood sugar once a day.  vary the time of day when you check, between before the 3 meals, and at bedtime.  also check if you have symptoms of your blood sugar being too high or too low.  please keep a record of the readings and bring it to your next appointment here (or you can bring the meter itself).  You can write it on any piece of paper.  please call us sooner if your blood sugar goes below 70, or if you have a lot of readings over 200.   

## 2017-04-26 NOTE — Progress Notes (Signed)
Subjective:    Patient ID: Bruce Romero, male    DOB: 12/09/1945, 72 y.o.   MRN: [id number]  HPI Pt returns for f/u of diabetes mellitus: DM type: 2 Dx'ed: 8832 Complications: polyneuropathy Therapy: trulicity and metformin.   DKA: never Severe hypoglycemia: never.  Pancreatitis: never Other: he has never been on insulin; he says he cannot afford to add another name brand medication. Interval history: she took prednisone last month, for allergic rhinitis.  The pattern of his cbg's indicates he needs some adjustment in his therapy.  All are in the mid to high-100's.   Past Medical History:  Diagnosis Date  . Acute medial meniscal tear, left, initial encounter   . Anxiety   . Diabetes mellitus   . GERD (gastroesophageal reflux disease)   . Hyperlipidemia   . Hypertension   . Irritable bowel   . Spastic colon    anaphylaxis due to [place]    Past Surgical History:  Procedure Laterality Date  . CHOLECYSTECTOMY    . CHONDROPLASTY Left 01/15/2016   Procedure: CHONDROPLASTY;  Surgeon: Melrose Nakayama, MD;  Location: [facility];  Service: Orthopedics;  Laterality: Left;  . KNEE ARTHROSCOPY WITH MEDIAL MENISECTOMY Left 01/15/2016   Procedure: ARTHROSCOPY LEFT KNEE WITH PARTIAL MEDIAL MENISECTOMY;  Surgeon: Melrose Nakayama, MD;  Location: [facility];  Service: Orthopedics;  Laterality: Left;  . NASAL SINUS SURGERY      Social History   Socioeconomic History  . Marital status: Married    Spouse name: Not on file  . Number of children: Not on file  . Years of education: Not on file  . Highest education level: Not on file  Occupational History  . Occupation: retired  Scientific laboratory technician  . Financial resource strain: Not on file  . Food insecurity:    Worry: Not on file    Inability: Not on file  . Transportation needs:    Medical: Not on file    Non-medical: Not on file  Tobacco Use  . Smoking status: Former Research scientist (life sciences)  . Smokeless tobacco: Never  Used  Substance and Sexual Activity  . Alcohol use: Yes    Alcohol/week: 0.0 oz    Comment: social  . Drug use: No  . Sexual activity: Not on file  Lifestyle  . Physical activity:    Days per week: Not on file    Minutes per session: Not on file  . Stress: Not on file  Relationships  . Social connections:    Talks on phone: Not on file    Gets together: Not on file    Attends religious service: Not on file    Active member of club or organization: Not on file    Attends meetings of clubs or organizations: Not on file    Relationship status: Not on file  . Intimate partner violence:    Fear of current or ex partner: Not on file    Emotionally abused: Not on file    Physically abused: Not on file    Forced sexual activity: Not on file  Other Topics Concern  . Not on file  Social History Narrative   Exercise try to everyday.    Current Outpatient Medications on File Prior to Visit  Medication Sig Dispense Refill  . albuterol (PROAIR HFA) 108 (90 Base) MCG/ACT inhaler Inhale 2 puffs every 6 (six) hours as needed into the lungs for wheezing. 1 Inhaler 0  . ALPRAZolam (XANAX) 0.25 MG tablet take  1 tablet by mouth three times a day if needed 30 tablet 0  . amLODipine (NORVASC) 10 MG tablet take 1 tablet by mouth once daily 90 tablet 1  . atorvastatin (LIPITOR) 10 MG tablet take 1 tablet by mouth once daily 90 tablet 1  . Blood Glucose Monitoring Suppl (ONE TOUCH ULTRA 2) w/Device KIT See admin instructions.  0  . candesartan (ATACAND) 16 MG tablet Take 1 tablet (16 mg total) at bedtime by mouth. 90 tablet 1  . Cholecalciferol (VITAMIN D) 2000 UNITS tablet Take 2,000 Units by mouth daily.      Marland Kitchen co-enzyme Q-10 30 MG capsule Take 100 mg by mouth daily.    Marland Kitchen dicyclomine (BENTYL) 20 MG tablet TAKE 1 TABLET BY MOUTH EVERY 6 HOURS 60 tablet 1  . esomeprazole (NEXIUM) 40 MG capsule Take 20 mg by mouth daily.    . fluticasone (FLONASE) 50 MCG/ACT nasal spray Place into both nostrils  daily.    . Insulin Pen Needle 32G X 6 MM MISC Use as directed with insulin pen 30 each 3  . LINZESS 290 MCG CAPS capsule take 1 capsule by mouth once daily BEFORE BREAKFAST 30 capsule 5  . MAGNESIUM GLYCINATE PLUS PO Take 400 tablets by mouth 2 (two) times daily.    . metFORMIN (GLUCOPHAGE-XR) 750 MG 24 hr tablet Take 1 tablet (750 mg total) by mouth 2 (two) times daily with a meal. 180 tablet 3  . moxifloxacin (VIGAMOX) 0.5 % ophthalmic solution Place 1 drop into both eyes 3 (three) times daily. 3 mL 0  . niacin (SLO-NIACIN) 500 MG tablet Take 500 mg by mouth at bedtime.    Marland Kitchen oseltamivir (TAMIFLU) 75 MG capsule Take 1 capsule (75 mg total) by mouth 2 (two) times daily. 10 capsule 0  . TRULICITY 1.5 HY/8.5OY SOPN INJECT 1 SYRINGE WEEKLY AS DIRECTED 2 mL 3  . [DISCONTINUED] citalopram (CELEXA) 10 MG tablet Take 1 tablet (10 mg total) by mouth daily. 90 tablet 0   No current facility-administered medications on file prior to visit.     Allergies  Allergen Reactions  . Cephalexin Anaphylaxis    REACTION: pt is not allergic to penicillin  . Bactrim [Sulfamethoxazole-Trimethoprim] Other (See Comments)    ? Took Bactrim and Flexeril at same time had lip swelling unsure which caused reaction.  Yvette Rack [Cyclobenzaprine] Swelling    Family History  Problem Relation Age of Onset  . Stroke Mother   . Pulmonary embolism Father   . Diabetes Brother     BP 122/84   Pulse 81   Ht _0  (1.778 m)   Wt 198 lb (89.8 kg)   SpO2 98%   BMI 28.41 kg/m    Review of Systems He denies hypoglycemia.     Objective:   Physical Exam VITAL SIGNS:  See vs page GENERAL: no distress Pulses: dorsalis pedis intact bilat.   MSK: no deformity of the feet CV: no leg edema Skin:  no ulcer on the feet.  normal color and temp on the feet.  Neuro: sensation is intact to touch on the feet.  Lab Results  Component Value Date   CREATININE 0.93 01/06/2017   BUN 15 01/06/2017   NA 137 01/06/2017   K  4.0 01/06/2017   CL 99 01/06/2017   CO2 31 01/06/2017   Lab Results  Component Value Date   HGBA1C 6.9 (H) 04/26/2017       Assessment & Plan:  Type 2 DM, with polyneuropathy.  well-controlled. Please continue the same medications  Patient Instructions  A diabetes blood test is requested for you today.  We'll let you know about the results. Please come back for a follow-up appointment in 6 months.   check your blood sugar once a day.  vary the time of day when you check, between before the 3 meals, and at bedtime.  also check if you have symptoms of your blood sugar being too high or too low.  please keep a record of the readings and bring it to your next appointment here (or you can bring the meter itself).  You can write it on any piece of paper.  please call us sooner if your blood sugar goes below 70, or if you have a lot of readings over 200.

## 2017-05-30 ENCOUNTER — Other Ambulatory Visit: Payer: Self-pay | Admitting: Family Medicine

## 2017-07-04 ENCOUNTER — Other Ambulatory Visit: Payer: Self-pay | Admitting: Family Medicine

## 2017-07-06 ENCOUNTER — Ambulatory Visit: Payer: Medicare Other | Admitting: *Deleted

## 2017-07-06 NOTE — Progress Notes (Signed)
Subjective:   Bruce Romero is a 72 y.o. male who presents for Medicare Annual/Subsequent preventive examination.  Review of Systems: No ROS.  Medicare Wellness Visit. Additional risk factors are reflected in the social history.  Cardiac Risk Factors include: advanced age (>59mn, >>72women);diabetes mellitus;dyslipidemia;hypertension;male gender Sleep patterns: Sleeps 8-10 hrs.  Home Safety/Smoke Alarms: Feels safe in home. Smoke alarms in place.  Living environment; residence and Firearm Safety: Lives in 3 story home with daughter and wife. Dtr is a nMarine scientist Does not use top level.   Eye: Dr.Stacey Palmer yearly.  Male:   CCS- 06/28/10    PSA-  Lab Results  Component Value Date   PSA 0.60 06/27/2016   PSA 0.71 09/16/2013   PSA 0.52 11/03/2009       Objective:    Vitals: BP 120/62 (BP Location: Left Arm, Patient Position: Sitting, Cuff Size: Normal)   Pulse 61   Ht '5\' 10"'  (1.778 m)   Wt 199 lb 12.8 oz (90.6 kg)   SpO2 98%   BMI 28.67 kg/m   Body mass index is 28.67 kg/m.  Advanced Directives 07/11/2017 07/05/2016 04/29/2016 01/15/2016 01/11/2016 12/30/2010  Does Patient Have a Medical Advance Directive? Yes Yes Yes Yes Yes Patient has advance directive, copy not in chart  Type of [place] - - Living will -  Does patient want to make changes to medical advance directive? - - - No - Patient declined - -  [place] of [place]? Yes Yes - - - -  Pre-existing out of facility DNR order (yellow form or pink MOST form) - - - - - No    Tobacco Social History   Tobacco Use  Smoking Status Former Smoker  Smokeless Tobacco Never Used     Counseling given: Not Answered   Clinical Intake:     Pain : No/denies pain                 Past Medical History:  Diagnosis Date  . Acute medial meniscal tear, left, initial encounter   . Anxiety   . Diabetes  mellitus   . GERD (gastroesophageal reflux disease)   . Hyperlipidemia   . Hypertension   . Irritable bowel   . Spastic colon    anaphylaxis due to [place]  Past Surgical History:  Procedure Laterality Date  . CHOLECYSTECTOMY    . CHONDROPLASTY Left 01/15/2016   Procedure: CHONDROPLASTY;  Surgeon: PMelrose Nakayama MD;  [facility]  Service: Orthopedics;  Laterality: Left;  . KNEE ARTHROSCOPY WITH MEDIAL MENISECTOMY Left 01/15/2016   Procedure: ARTHROSCOPY LEFT KNEE WITH PARTIAL MEDIAL MENISECTOMY;  Surgeon: PMelrose Nakayama MD;  [place] [facility]  Service: Orthopedics;  Laterality: Left;  . NASAL SINUS SURGERY     Family History  Problem Relation Age of Onset  . Stroke Mother   . Pulmonary embolism Father   . Diabetes Brother    Social History   Socioeconomic History  . Marital status: Married    Spouse name: Not on file  . Number of children: Not on file  . Years of education: Not on file  . Highest education level: Not on file  Occupational History  . Occupation: retired  SScientific laboratory technician . Financial resource strain: Not on file  . Food insecurity:    Worry: Not on file    Inability: Not on file  .  Transportation needs:    Medical: Not on file    Non-medical: Not on file  Tobacco Use  . Smoking status: Former Research scientist (life sciences)  . Smokeless tobacco: Never Used  Substance and Sexual Activity  . Alcohol use: Yes    Alcohol/week: 0.0 oz    Comment: social  . Drug use: No  . Sexual activity: Yes  Lifestyle  . Physical activity:    Days per week: Not on file    Minutes per session: Not on file  . Stress: Not on file  Relationships  . Social connections:    Talks on phone: Not on file    Gets together: Not on file    Attends religious service: Not on file    Active member of club or organization: Not on file    Attends meetings of clubs or organizations: Not on file    Relationship status: Not on file  Other Topics Concern  .  Not on file  Social History Narrative   Exercise try to everyday.    Outpatient Encounter Medications as of 07/11/2017  Medication Sig  . amLODipine (NORVASC) 10 MG tablet take 1 tablet by mouth once daily  . atorvastatin (LIPITOR) 10 MG tablet take 1 tablet by mouth once daily  . Blood Glucose Monitoring Suppl (ONE TOUCH ULTRA 2) w/Device KIT See admin instructions.  . candesartan (ATACAND) 16 MG tablet Take 1 tablet (16 mg total) at bedtime by mouth.  . Cholecalciferol (VITAMIN D) 2000 UNITS tablet Take 2,000 Units by mouth daily.    Marland Kitchen co-enzyme Q-10 30 MG capsule Take 100 mg by mouth daily.  Marland Kitchen dicyclomine (BENTYL) 20 MG tablet TAKE 1 TABLET BY MOUTH EVERY 6 HOURS  . esomeprazole (NEXIUM) 40 MG capsule Take 20 mg by mouth daily.  . fluticasone (FLONASE) 50 MCG/ACT nasal spray Place into both nostrils daily.  Marland Kitchen glucose blood (ONETOUCH VERIO) test strip 1 each by Other route daily. And lancets 1/day  . Insulin Pen Needle 32G X 6 MM MISC Use as directed with insulin pen  . LINZESS 290 MCG CAPS capsule take 1 capsule by mouth once daily BEFORE BREAKFAST  . metFORMIN (GLUCOPHAGE-XR) 750 MG 24 hr tablet Take 1 tablet (750 mg total) by mouth 2 (two) times daily with a meal.  . niacin (SLO-NIACIN) 500 MG tablet Take 500 mg by mouth at bedtime.  . TRULICITY 1.5 QI/3.4VQ SOPN INJECT 1 PEN UNDER THE SKIN EVERY WEEK AS DIRECTED  . albuterol (PROAIR HFA) 108 (90 Base) MCG/ACT inhaler Inhale 2 puffs every 6 (six) hours as needed into the lungs for wheezing. (Patient not taking: Reported on 07/11/2017)  . ALPRAZolam (XANAX) 0.25 MG tablet take 1 tablet by mouth three times a day if needed (Patient not taking: Reported on 07/11/2017)  . moxifloxacin (VIGAMOX) 0.5 % ophthalmic solution Place 1 drop into both eyes 3 (three) times daily. (Patient not taking: Reported on 07/11/2017)  . [DISCONTINUED] citalopram (CELEXA) 10 MG tablet Take 1 tablet (10 mg total) by mouth daily.  . [DISCONTINUED] MAGNESIUM GLYCINATE  PLUS PO Take 400 tablets by mouth 2 (two) times daily.  . [DISCONTINUED] oseltamivir (TAMIFLU) 75 MG capsule Take 1 capsule (75 mg total) by mouth 2 (two) times daily.   No facility-administered encounter medications on file as of 07/11/2017.     Activities of Daily Living In your present state of health, do you have any difficulty performing the following activities: 07/11/2017  Hearing? N  Vision? N  Comment wears reading glasses.  Difficulty concentrating or making decisions? N  Walking or climbing stairs? N  Dressing or bathing? N  Doing errands, shopping? N  Preparing Food and eating ? N  Using the Toilet? N  In the past six months, have you accidently leaked urine? N  Do you have problems with loss of bowel control? N  Comment IBS issues occassionally.  Managing your Medications? N  Managing your Finances? N  Housekeeping or managing your Housekeeping? N  Some recent data might be hidden    Patient Care Team: Carollee Herter, Alferd Apa, DO as PCP - General Bjorn Loser, MD as Consulting Physician (Urology) Renato Shin, MD as Consulting Physician (Endocrinology) Druscilla Brownie, MD as Consulting Physician (Dermatology) Lorretta Harp, MD as Consulting Physician (Cardiology) Melrose Nakayama, MD as Consulting Physician (Orthopedic Surgery) Christain Sacramento, Dell as Consulting Physician (Optometry) Rip Harbour, DDS as Consulting Physician (Dentistry)   Assessment:   This is a routine wellness examination for Bruce Romero. Physical assessment deferred to PCP.  Exercise Activities and Dietary recommendations Current Exercise Habits: Structured exercise class, Time (Minutes): 45, Frequency (Times/Week): 3, Weekly Exercise (Minutes/Week): 135, Intensity: Mild Diet (meal preparation, eat out, water intake, caffeinated beverages, dairy products, fruits and vegetables): 24 hour recall Breakfast:grits and coffee Lunch: fruit Dinner:   2 hot dogs  Reports drinking plenty of water  Goals     . Maintain Healthy  Lifestyle       Fall Risk Fall Risk  07/05/2016 04/29/2016 07/02/2015 05/15/2014 03/04/2013  Falls in the past year? No No No No No   Depression Screen PHQ 2/9 Scores 07/05/2016 04/29/2016 07/02/2015 05/15/2014  PHQ - 2 Score 0 0 0 0    Cognitive Function MMSE - Mini Mental State Exam 07/05/2016  Orientation to time 5  Orientation to Place 5  Registration 3  Attention/ Calculation 5  Recall 3  Language- name 2 objects 2  Language- repeat 1  Language- follow 3 step command 3  Language- read & follow direction 1  Write a sentence 1  Copy design 1  Total score 30        Immunization History  Administered Date(s) Administered  . Influenza Split 01/04/2011, 11/21/2011  . Influenza Whole 01/07/1999  . Influenza, High Dose Seasonal PF 10/15/2015  . Influenza,inj,Quad PF,6+ Mos 11/29/2012, 11/18/2013, 02/10/2015  . Pneumococcal Conjugate-13 01/21/2014  . Pneumococcal Polysaccharide-23 04/08/2015  . Td 05/03/2004  . Tdap 05/09/2014  . Zoster 05/13/2008    Screening Tests Health Maintenance  Topic Date Due  . INFLUENZA VACCINE  09/07/2017  . OPHTHALMOLOGY EXAM  09/07/2017  . HEMOGLOBIN A1C  10/27/2017  . FOOT EXAM  04/27/2018  . COLONOSCOPY  06/27/2020  . TETANUS/TDAP  05/08/2024  . Hepatitis C Screening  Completed  . PNA vac Low Risk Adult  Completed        Plan:  Please schedule your next medicare wellness visit with me in 1 yr.  Continue to eat heart healthy diet (full of fruits, vegetables, whole grains, lean protein, water--limit salt, fat, and sugar intake) and increase physical activity as tolerated.  Continue doing brain stimulating activities (puzzles, reading, adult coloring books, staying active) to keep memory sharp.     I have personally reviewed and noted the following in the patient's chart:   . Medical and social history . Use of alcohol, tobacco or illicit drugs  . Current medications and supplements . Functional ability and  status . Nutritional status . Physical activity . Advanced directives . List of other physicians .  Hospitalizations, surgeries, and ER visits in previous 12 months . Vitals . Screenings to include cognitive, depression, and falls . Referrals and appointments  In addition, I have reviewed and discussed with patient certain preventive protocols, quality metrics, and best practice recommendations. A written personalized care plan for preventive services as well as general preventive health recommendations were provided to patient.     Shela Nevin, [place]  07/11/2017

## 2017-07-11 ENCOUNTER — Ambulatory Visit (INDEPENDENT_AMBULATORY_CARE_PROVIDER_SITE_OTHER): Payer: Medicare Other | Admitting: *Deleted

## 2017-07-11 ENCOUNTER — Encounter: Payer: Self-pay | Admitting: *Deleted

## 2017-07-11 VITALS — BP 120/62 | HR 61 | Ht 70.0 in | Wt 199.8 lb

## 2017-07-11 DIAGNOSIS — Z0000 Encounter for general adult medical examination without abnormal findings: Secondary | ICD-10-CM | POA: Diagnosis not present

## 2017-07-11 NOTE — Progress Notes (Signed)
Reviewed  Yvonne R Lowne Chase, DO  

## 2017-07-11 NOTE — Patient Instructions (Signed)
Please schedule your next medicare wellness visit with me in 1 yr.  Continue to eat heart healthy diet (full of fruits, vegetables, whole grains, lean protein, water--limit salt, fat, and sugar intake) and increase physical activity as tolerated.  Continue doing brain stimulating activities (puzzles, reading, adult coloring books, staying active) to keep memory sharp.    [name] , Thank you for taking time to come for your Medicare Wellness Visit. I appreciate your ongoing commitment to your health goals. Please review the following plan we discussed and let me know if I can assist you in the future.   These are the goals we discussed: Goals    . Maintain Healthy  Lifestyle       This is a list of the screening recommended for you and due dates:  Health Maintenance  Topic Date Due  . Flu Shot  09/07/2017  . Eye exam for diabetics  09/07/2017  . Hemoglobin A1C  10/27/2017  . Complete foot exam   04/27/2018  . Colon Cancer Screening  06/27/2020  . Tetanus Vaccine  05/08/2024  .  Hepatitis C: One time screening is recommended by Center for Disease Control  (CDC) for  adults born from [birth year] through 1966.   Completed  . Pneumonia vaccines  Completed    Health Maintenance, Male A healthy lifestyle and preventive care is important for your health and wellness. Ask your health care provider about what schedule of regular examinations is right for you. What should I know about weight and diet? Eat a Healthy Diet  Eat plenty of vegetables, fruits, whole grains, low-fat dairy products, and lean protein.  Do not eat a lot of foods high in solid fats, added sugars, or salt.  Maintain a Healthy Weight Regular exercise can help you achieve or maintain a healthy weight. You should:  Do at least 150 minutes of exercise each week. The exercise should increase your heart rate and make you sweat (moderate-intensity exercise).  Do strength-training exercises at least twice a week.  Watch  Your Levels of Cholesterol and Blood Lipids  Have your blood tested for lipids and cholesterol every 5 years starting at 73 years of age. If you are at high risk for heart disease, you should start having your blood tested when you are 73 years old. You may need to have your cholesterol levels checked more often if: ? Your lipid or cholesterol levels are high. ? You are older than 73 years of age. ? You are at high risk for heart disease.  What should I know about cancer screening? Many types of cancers can be detected early and may often be prevented. Lung Cancer  You should be screened every year for lung cancer if: ? You are a current smoker who has smoked for at least 30 years. ? You are a former smoker who has quit within the past 15 years.  Talk to your health care provider about your screening options, when you should start screening, and how often you should be screened.  Colorectal Cancer  Routine colorectal cancer screening usually begins at 73 years of age and should be repeated every 5-10 years until you are 73 years old. You may need to be screened more often if early forms of precancerous polyps or small growths are found. Your health care provider may recommend screening at an earlier age if you have risk factors for colon cancer.  Your health care provider may recommend using home test kits to check for hidden  blood in the stool.  A small camera at the end of a tube can be used to examine your colon (sigmoidoscopy or colonoscopy). This checks for the earliest forms of colorectal cancer.  Prostate and Testicular Cancer  Depending on your age and overall health, your health care provider may do certain tests to screen for prostate and testicular cancer.  Talk to your health care provider about any symptoms or concerns you have about testicular or prostate cancer.  Skin Cancer  Check your skin from head to toe regularly.  Tell your health care provider about any new  moles or changes in moles, especially if: ? There is a change in a mole's size, shape, or color. ? You have a mole that is larger than a pencil eraser.  Always use sunscreen. Apply sunscreen liberally and repeat throughout the day.  Protect yourself by wearing long sleeves, pants, a wide-brimmed hat, and sunglasses when outside.  What should I know about heart disease, diabetes, and high blood pressure?  If you are 66-64 years of age, have your blood pressure checked every 3-5 years. If you are 6 years of age or older, have your blood pressure checked every year. You should have your blood pressure measured twice-once when you are at a hospital or clinic, and once when you are not at a hospital or clinic. Record the average of the two measurements. To check your blood pressure when you are not at a hospital or clinic, you can use: ? An automated blood pressure machine at a pharmacy. ? A home blood pressure monitor.  Talk to your health care provider about your target blood pressure.  If you are between 56-82 years old, ask your health care provider if you should take aspirin to prevent heart disease.  Have regular diabetes screenings by checking your fasting blood sugar level. ? If you are at a normal weight and have a low risk for diabetes, have this test once every three years after the age of 43. ? If you are overweight and have a high risk for diabetes, consider being tested at a younger age or more often.  A one-time screening for abdominal aortic aneurysm (AAA) by ultrasound is recommended for men aged 15-75 years who are current or former smokers. What should I know about preventing infection? Hepatitis B If you have a higher risk for hepatitis B, you should be screened for this virus. Talk with your health care provider to find out if you are at risk for hepatitis B infection. Hepatitis C Blood testing is recommended for:  Everyone born from [birth year] through 1966.  Anyone with  known risk factors for hepatitis C.  Sexually Transmitted Diseases (STDs)  You should be screened each year for STDs including gonorrhea and chlamydia if: ? You are sexually active and are younger than 73 years of age. ? You are older than 73 years of age and your health care provider tells you that you are at risk for this type of infection. ? Your sexual activity has changed since you were last screened and you are at an increased risk for chlamydia or gonorrhea. Ask your health care provider if you are at risk.  Talk with your health care provider about whether you are at high risk of being infected with HIV. Your health care provider may recommend a prescription medicine to help prevent HIV infection.  What else can I do?  Schedule regular health, dental, and eye exams.  Stay current with your vaccines (  immunizations).  Do not use any tobacco products, such as cigarettes, chewing tobacco, and e-cigarettes. If you need help quitting, ask your health care provider.  Limit alcohol intake to no more than 2 drinks per day. One drink equals 12 ounces of beer, 5 ounces of wine, or 1 ounces of hard liquor.  Do not use street drugs.  Do not share needles.  Ask your health care provider for help if you need support or information about quitting drugs.  Tell your health care provider if you often feel depressed.  Tell your health care provider if you have ever been abused or do not feel safe at home. This information is not intended to replace advice given to you by your health care provider. Make sure you discuss any questions you have with your health care provider. Document Released: 07/23/2007 Document Revised: 09/23/2015 Document Reviewed: 10/28/2014 Elsevier Interactive Patient Education  [employer].

## 2017-07-17 ENCOUNTER — Other Ambulatory Visit: Payer: Self-pay | Admitting: Family Medicine

## 2017-07-19 DIAGNOSIS — L570 Actinic keratosis: Secondary | ICD-10-CM | POA: Diagnosis not present

## 2017-07-19 DIAGNOSIS — Z85828 Personal history of other malignant neoplasm of skin: Secondary | ICD-10-CM | POA: Diagnosis not present

## 2017-07-19 DIAGNOSIS — L821 Other seborrheic keratosis: Secondary | ICD-10-CM | POA: Diagnosis not present

## 2017-07-19 DIAGNOSIS — D225 Melanocytic nevi of trunk: Secondary | ICD-10-CM | POA: Diagnosis not present

## 2017-07-31 ENCOUNTER — Other Ambulatory Visit: Payer: Self-pay | Admitting: Family Medicine

## 2017-08-02 LAB — HM DIABETES EYE EXAM

## 2017-08-24 ENCOUNTER — Other Ambulatory Visit: Payer: Self-pay | Admitting: Family Medicine

## 2017-09-23 ENCOUNTER — Other Ambulatory Visit: Payer: Self-pay | Admitting: Family Medicine

## 2017-10-12 ENCOUNTER — Other Ambulatory Visit: Payer: Self-pay | Admitting: Family Medicine

## 2017-10-12 ENCOUNTER — Other Ambulatory Visit: Payer: Self-pay | Admitting: Endocrinology

## 2017-10-13 ENCOUNTER — Other Ambulatory Visit: Payer: Self-pay | Admitting: *Deleted

## 2017-10-13 MED ORDER — ATORVASTATIN CALCIUM 10 MG PO TABS: 10.0000 mg | ORAL_TABLET | Freq: Every day | ORAL | 0 refills | Status: DC

## 2017-10-25 ENCOUNTER — Other Ambulatory Visit: Payer: Self-pay | Admitting: Family Medicine

## 2017-10-26 NOTE — Telephone Encounter (Signed)
Patient need to schedule an ov for more refills. 

## 2017-10-27 ENCOUNTER — Ambulatory Visit: Payer: Medicare Other | Admitting: Endocrinology

## 2017-10-27 NOTE — Telephone Encounter (Signed)
Dr Carollee Herter -- we refilled Trulicity Rx in August but pt sees Dr Loanne Drilling. Ok to refill or should endo be prescribing?

## 2017-10-30 ENCOUNTER — Ambulatory Visit: Payer: Medicare Other | Admitting: Family

## 2017-10-30 ENCOUNTER — Other Ambulatory Visit: Payer: Self-pay | Admitting: Family

## 2017-10-30 ENCOUNTER — Encounter: Payer: Self-pay | Admitting: Family

## 2017-10-30 VITALS — BP 126/74 | HR 83 | Temp 97.7°F | Resp 20 | Ht 70.0 in | Wt 198.4 lb

## 2017-10-30 DIAGNOSIS — R102 Pelvic and perineal pain: Secondary | ICD-10-CM | POA: Diagnosis not present

## 2017-10-30 DIAGNOSIS — F411 Generalized anxiety disorder: Secondary | ICD-10-CM | POA: Diagnosis not present

## 2017-10-30 LAB — CBC WITH DIFFERENTIAL/PLATELET
BASOS PCT: 1 % (ref 0.0–3.0)
Basophils Absolute: 0.1 10*3/uL (ref 0.0–0.1)
EOS ABS: 0.4 10*3/uL (ref 0.0–0.7)
Eosinophils Relative: 5.9 % — ABNORMAL HIGH (ref 0.0–5.0)
HEMATOCRIT: 40 % (ref 39.0–52.0)
HEMOGLOBIN: 13.7 g/dL (ref 13.0–17.0)
Lymphocytes Relative: 30.4 % (ref 12.0–46.0)
Lymphs Abs: 1.9 10*3/uL (ref 0.7–4.0)
MCHC: 34.2 g/dL (ref 30.0–36.0)
MCV: 87 fl (ref 78.0–100.0)
MONO ABS: 0.5 10*3/uL (ref 0.1–1.0)
Monocytes Relative: 7.8 % (ref 3.0–12.0)
Neutro Abs: 3.5 10*3/uL (ref 1.4–7.7)
Neutrophils Relative %: 54.9 % (ref 43.0–77.0)
PLATELETS: 179 10*3/uL (ref 150.0–400.0)
RBC: 4.61 Mil/uL (ref 4.22–5.81)
RDW: 13.7 % (ref 11.5–15.5)
WBC: 6.4 10*3/uL (ref 4.0–10.5)

## 2017-10-30 LAB — BASIC METABOLIC PANEL
BUN: 14 mg/dL (ref 6–23)
CO2: 25 mEq/L (ref 19–32)
CREATININE: 0.9 mg/dL (ref 0.40–1.50)
Calcium: 8.9 mg/dL (ref 8.4–10.5)
Chloride: 101 mEq/L (ref 96–112)
GFR: 87.85 mL/min (ref >60.00)
Glucose, Bld: 120 mg/dL — ABNORMAL HIGH (ref 70–99)
Potassium: 4.2 mEq/L (ref 3.5–5.1)
Sodium: 137 mEq/L (ref 135–145)

## 2017-10-30 LAB — URINALYSIS, ROUTINE W REFLEX MICROSCOPIC
Bilirubin Urine: NEGATIVE
Hgb urine dipstick: NEGATIVE
LEUKOCYTES UA: NEGATIVE
Nitrite: NEGATIVE
Specific Gravity, Urine: 1.02 (ref 1.000–1.030)
TOTAL PROTEIN, URINE-UPE24: NEGATIVE
UROBILINOGEN UA: 0.2 (ref 0.0–1.0)
Urine Glucose: NEGATIVE
pH: 5.5 (ref 5.0–8.0)

## 2017-10-30 LAB — PSA: PSA: 0.44 ng/mL (ref 0.10–4.00)

## 2017-10-30 MED ORDER — ALPRAZOLAM 0.25 MG PO TABS: ORAL_TABLET | ORAL | 0 refills | Status: DC

## 2017-10-30 MED ORDER — MELOXICAM 7.5 MG PO TABS: 7.5000 mg | ORAL_TABLET | Freq: Every day | ORAL | 0 refills | Status: DC

## 2017-10-30 MED ORDER — TAMSULOSIN HCL 0.4 MG PO CAPS: 0.4000 mg | ORAL_CAPSULE | Freq: Every day | ORAL | 3 refills | Status: DC

## 2017-10-30 NOTE — Patient Instructions (Signed)
Please continue flomax and as needed meloxicam. Call if new/worsening symptoms or if symptoms are not improved in 1 week. Complete lab work prior to leaving.
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
## 2017-10-31 LAB — URINE CULTURE
MICRO NUMBER:: 91139441
RESULT: NO GROWTH
SPECIMEN QUALITY:: ADEQUATE

## 2017-11-01 ENCOUNTER — Other Ambulatory Visit: Payer: Self-pay | Admitting: *Deleted

## 2017-11-01 ENCOUNTER — Encounter: Payer: Self-pay | Admitting: Family

## 2017-11-01 DIAGNOSIS — K589 Irritable bowel syndrome without diarrhea: Secondary | ICD-10-CM

## 2017-11-01 MED ORDER — LINACLOTIDE 290 MCG PO CAPS: ORAL_CAPSULE | ORAL | 0 refills | Status: DC

## 2017-11-02 ENCOUNTER — Encounter: Payer: Self-pay | Admitting: Endocrinology

## 2017-11-02 ENCOUNTER — Ambulatory Visit: Payer: Medicare Other | Admitting: Endocrinology

## 2017-11-02 VITALS — BP 122/78 | HR 61 | Ht 70.0 in | Wt 197.8 lb

## 2017-11-02 DIAGNOSIS — E119 Type 2 diabetes mellitus without complications: Secondary | ICD-10-CM

## 2017-11-02 LAB — POCT GLYCOSYLATED HEMOGLOBIN (HGB A1C): Hemoglobin A1C: 6.5 % — AB (ref 4.0–5.6)

## 2017-11-02 NOTE — Patient Instructions (Signed)
A diabetes blood test is requested for you today.  We'll let you know about the results. Please come back for a follow-up appointment in 6 months.   check your blood sugar once a day.  vary the time of day when you check, between before the 3 meals, and at bedtime.  also check if you have symptoms of your blood sugar being too high or too low.  please keep a record of the readings and bring it to your next appointment here (or you can bring the meter itself).  You can write it on any piece of paper.  please call us sooner if your blood sugar goes below 70, or if you have a lot of readings over 200.

## 2017-11-02 NOTE — Progress Notes (Signed)
Subjective:    Patient ID: Bruce Romero, male    DOB: 18-Jan-1946, 72 y.o.   MRN: [id number]  HPI Pt returns for f/u of diabetes mellitus: DM type: 2 Dx'ed: 0865 Complications: polyneuropathy Therapy: trulicity and metformin.   DKA: never Severe hypoglycemia: never.  Pancreatitis: never Other: he has never been on insulin; he says he cannot afford to add another name brand medication.   Interval history:  pt states he feels well in general.  He says cbg's are well-controlled.   Past Medical History:  Diagnosis Date  . Acute medial meniscal tear, left, initial encounter   . Anxiety   . Diabetes mellitus   . GERD (gastroesophageal reflux disease)   . Hyperlipidemia   . Hypertension   . Irritable bowel   . Spastic colon    anaphylaxis due to [place]    Past Surgical History:  Procedure Laterality Date  . CHOLECYSTECTOMY    . CHONDROPLASTY Left 01/15/2016   Procedure: CHONDROPLASTY;  Surgeon: Melrose Nakayama, MD;  Location: [place];  Service: Orthopedics;  Laterality: Left;  . KNEE ARTHROSCOPY WITH MEDIAL MENISECTOMY Left 01/15/2016   Procedure: ARTHROSCOPY LEFT KNEE WITH PARTIAL MEDIAL MENISECTOMY;  Surgeon: Melrose Nakayama, MD;  Location: [place];  Service: Orthopedics;  Laterality: Left;  . NASAL SINUS SURGERY      Social History   Socioeconomic History  . Marital status: Married    Spouse name: Not on file  . Number of children: Not on file  . Years of education: Not on file  . Highest education level: Not on file  Occupational History  . Occupation: retired  Scientific laboratory technician  . Financial resource strain: Not on file  . Food insecurity:    Worry: Not on file    Inability: Not on file  . Transportation needs:    Medical: Not on file    Non-medical: Not on file  Tobacco Use  . Smoking status: Former Research scientist (life sciences)  . Smokeless tobacco: Never Used  Substance and Sexual Activity  . Alcohol use: Yes    Alcohol/week: 0.0 standard  drinks    Comment: social  . Drug use: No  . Sexual activity: Yes  Lifestyle  . Physical activity:    Days per week: Not on file    Minutes per session: Not on file  . Stress: Not on file  Relationships  . Social connections:    Talks on phone: Not on file    Gets together: Not on file    Attends religious service: Not on file    Active member of club or organization: Not on file    Attends meetings of clubs or organizations: Not on file    Relationship status: Not on file  . Intimate partner violence:    Fear of current or ex partner: Not on file    Emotionally abused: Not on file    Physically abused: Not on file    Forced sexual activity: Not on file  Other Topics Concern  . Not on file  Social History Narrative   Exercise try to everyday.    Current Outpatient Medications on File Prior to Visit  Medication Sig Dispense Refill  . albuterol (PROAIR HFA) 108 (90 Base) MCG/ACT inhaler Inhale 2 puffs every 6 (six) hours as needed into the lungs for wheezing. 1 Inhaler 0  . ALPRAZolam (XANAX) 0.25 MG tablet take 1 tablet by mouth three times a day if needed 30 tablet 0  .  amLODipine (NORVASC) 10 MG tablet take 1 tablet by mouth once daily 90 tablet 1  . atorvastatin (LIPITOR) 10 MG tablet Take 1 tablet (10 mg total) by mouth daily. Need to follow up for lab work (Patient taking differently: Take 10 mg by mouth 3 (three) times a week. Due to joint pains) 90 tablet 0  . candesartan (ATACAND) 16 MG tablet TAKE 1 TABLET BY MOUTH AT BEDTIME 90 tablet 0  . Cholecalciferol (VITAMIN D) 2000 UNITS tablet Take 2,000 Units by mouth daily.      Marland Kitchen co-enzyme Q-10 30 MG capsule Take 100 mg by mouth daily.    Marland Kitchen dicyclomine (BENTYL) 20 MG tablet TAKE 1 TABLET BY MOUTH EVERY 6 HOURS 60 tablet 1  . esomeprazole (NEXIUM) 40 MG capsule Take 20 mg by mouth daily.    . fluticasone (FLONASE) 50 MCG/ACT nasal spray Place into both nostrils daily.    Marland Kitchen glucose blood (ONETOUCH VERIO) test strip 1 each by  Other route daily. And lancets 1/day 100 each 12  . Insulin Pen Needle 32G X 6 MM MISC Use as directed with insulin pen 30 each 3  . linaclotide (LINZESS) 290 MCG CAPS capsule take 1 capsule by mouth once daily BEFORE BREAKFAST 90 capsule 0  . meloxicam (MOBIC) 7.5 MG tablet Take 1 tablet (7.5 mg total) by mouth daily. 14 tablet 0  . metFORMIN (GLUCOPHAGE-XR) 750 MG 24 hr tablet TAKE 1 TABLET BY MOUTH TWICE A DAY WITH MEALS 180 tablet 0  . moxifloxacin (VIGAMOX) 0.5 % ophthalmic solution Place 1 drop into both eyes 3 (three) times daily. 3 mL 0  . niacin (SLO-NIACIN) 500 MG tablet Take 500 mg by mouth at bedtime.    . tamsulosin (FLOMAX) 0.4 MG CAPS capsule Take 1 capsule (0.4 mg total) by mouth daily. 30 capsule 3  . TRULICITY 1.5 EV/0.3JK SOPN INJECT 1 PEN UNDER THE SKIN EVERY WEEK AS DIRECTED 2 mL 0  . [DISCONTINUED] citalopram (CELEXA) 10 MG tablet Take 1 tablet (10 mg total) by mouth daily. 90 tablet 0   No current facility-administered medications on file prior to visit.     Allergies  Allergen Reactions  . Cephalexin Anaphylaxis    REACTION: pt is not allergic to penicillin  . Bactrim [Sulfamethoxazole-Trimethoprim] Other (See Comments)    ? Took Bactrim and Flexeril at same time had lip swelling unsure which caused reaction.  Yvette Rack [Cyclobenzaprine] Swelling    Family History  Problem Relation Age of Onset  . Stroke Mother   . Pulmonary embolism Father   . Diabetes Brother     BP 122/78 (BP Location: Left Arm, Patient Position: Sitting)   Pulse 61   Ht 5\' 10"  (1.778 m)   Wt 197 lb 12.8 oz (89.7 kg)   SpO2 98%   BMI 28.38 kg/m    Review of Systems Denies n/v/d    Objective:   Physical Exam VITAL SIGNS:  See vs page GENERAL: no distress Pulses: foot pulses are intact bilaterally.   MSK: no deformity of the feet or ankles.  CV: no edema of the legs or ankles Skin:  no ulcer on the feet or ankles.  normal color and temp on the feet and ankles Neuro:  sensation is intact to touch on the feet and ankles.    Lab Results  Component Value Date   HGBA1C 6.5 (A) 11/02/2017       Assessment & Plan:  Type 2 DM, with polyneuropathy: well-controlled HTN: well-controlled Please continue  the same medications

## 2017-11-07 ENCOUNTER — Other Ambulatory Visit: Payer: Self-pay | Admitting: Family Medicine

## 2017-11-21 ENCOUNTER — Other Ambulatory Visit: Payer: Self-pay | Admitting: Family Medicine

## 2017-11-27 DIAGNOSIS — N411 Chronic prostatitis: Secondary | ICD-10-CM | POA: Diagnosis not present

## 2017-12-07 ENCOUNTER — Other Ambulatory Visit: Payer: Self-pay | Admitting: Family Medicine

## 2017-12-19 ENCOUNTER — Other Ambulatory Visit: Payer: Self-pay | Admitting: Endocrinology

## 2018-01-02 ENCOUNTER — Encounter: Payer: Self-pay | Admitting: Family Medicine

## 2018-01-02 ENCOUNTER — Ambulatory Visit: Payer: Medicare Other | Admitting: Family Medicine

## 2018-01-02 VITALS — BP 133/72 | HR 79 | Temp 97.8°F | Resp 16 | Ht 70.0 in | Wt 202.0 lb

## 2018-01-02 DIAGNOSIS — R05 Cough: Secondary | ICD-10-CM

## 2018-01-02 DIAGNOSIS — J302 Other seasonal allergic rhinitis: Secondary | ICD-10-CM | POA: Diagnosis not present

## 2018-01-02 DIAGNOSIS — R059 Cough, unspecified: Secondary | ICD-10-CM

## 2018-01-02 MED ORDER — HYDROCODONE-HOMATROPINE 5-1.5 MG/5ML PO SYRP: 5.0000 mL | ORAL_SOLUTION | Freq: Three times a day (TID) | ORAL | 0 refills | Status: DC | PRN

## 2018-01-02 MED ORDER — PREDNISONE 10 MG PO TABS: ORAL_TABLET | ORAL | 0 refills | Status: DC

## 2018-01-02 NOTE — Patient Instructions (Signed)

## 2018-01-02 NOTE — Progress Notes (Signed)
Patient ID: Bruce Romero, male    DOB: 10/14/1945  Age: 72 y.o. MRN: [id number]    Subjective:  Subjective  HPI Bruce Romero presents for allergies and cough.  He is using allergy eyedrops and  Antihistamine for sneezing.  Symptoms x 2 weeks  He is also using flonase and neti pot   Review of Systems  Constitutional: Negative for chills and fever.  HENT: Positive for congestion, postnasal drip and sneezing. Negative for rhinorrhea, sinus pressure, sore throat, tinnitus, trouble swallowing and voice change.   Respiratory: Positive for cough. Negative for chest tightness, shortness of breath and wheezing.   Cardiovascular: Negative for chest pain, palpitations and leg swelling.  Allergic/Immunologic: Negative for environmental allergies.    History Past Medical History:  Diagnosis Date  . Acute medial meniscal tear, left, initial encounter   . Anxiety   . Diabetes mellitus   . GERD (gastroesophageal reflux disease)   . Hyperlipidemia   . Hypertension   . Irritable bowel   . Spastic colon    anaphylaxis due to [place]    He has a past surgical history that includes Cholecystectomy; Nasal sinus surgery; Knee arthroscopy with medial menisectomy (Left, 01/15/2016); and Chondroplasty (Left, 01/15/2016).   His family history includes Diabetes in his brother; Pulmonary embolism in his father; Stroke in his mother.He reports that he has quit smoking. He has never used smokeless tobacco. He reports that he drinks alcohol. He reports that he does not use drugs.  Current Outpatient Medications on File Prior to Visit  Medication Sig Dispense Refill  . albuterol (PROAIR HFA) 108 (90 Base) MCG/ACT inhaler Inhale 2 puffs every 6 (six) hours as needed into the lungs for wheezing. 1 Inhaler 0  . ALPRAZolam (XANAX) 0.25 MG tablet take 1 tablet by mouth three times a day if needed 30 tablet 0  . amLODipine (NORVASC) 10 MG tablet TAKE 1 TABLET BY MOUTH ONCE DAILY 90 tablet 0  . atorvastatin  (LIPITOR) 10 MG tablet Take 1 tablet (10 mg total) by mouth daily. Need to follow up for lab work (Patient taking differently: Take 10 mg by mouth 3 (three) times a week. Due to joint pains) 90 tablet 0  . candesartan (ATACAND) 16 MG tablet TAKE 1 TABLET BY MOUTH AT BEDTIME 90 tablet 0  . Cholecalciferol (VITAMIN D) 2000 UNITS tablet Take 2,000 Units by mouth daily.      Marland Kitchen co-enzyme Q-10 30 MG capsule Take 100 mg by mouth daily.    Marland Kitchen dicyclomine (BENTYL) 20 MG tablet TAKE 1 TABLET BY MOUTH EVERY 6 HOURS 60 tablet 5  . esomeprazole (NEXIUM) 40 MG capsule Take 20 mg by mouth daily.    Marland Kitchen FLUAD 0.5 ML SUSY ADM 0.5ML IM UTD  0  . fluticasone (FLONASE) 50 MCG/ACT nasal spray Place into both nostrils daily.    Marland Kitchen glucose blood (ONETOUCH VERIO) test strip 1 each by Other route daily. And lancets 1/day 100 each 12  . Insulin Pen Needle 32G X 6 MM MISC Use as directed with insulin pen 30 each 3  . linaclotide (LINZESS) 290 MCG CAPS capsule take 1 capsule by mouth once daily BEFORE BREAKFAST 90 capsule 0  . meloxicam (MOBIC) 7.5 MG tablet Take 1 tablet (7.5 mg total) by mouth daily. 14 tablet 0  . metFORMIN (GLUCOPHAGE-XR) 750 MG 24 hr tablet TAKE 1 TABLET BY MOUTH TWICE A DAY WITH MEALS 180 tablet 0  . moxifloxacin (VIGAMOX) 0.5 % ophthalmic solution Place 1 drop  into both eyes 3 (three) times daily. 3 mL 0  . niacin (SLO-NIACIN) 500 MG tablet Take 500 mg by mouth at bedtime.    . tamsulosin (FLOMAX) 0.4 MG CAPS capsule Take 1 capsule (0.4 mg total) by mouth daily. 30 capsule 3  . TRULICITY 1.5 JJ/8.8CZ SOPN INJECT 1 PEN UNDER THE SKIN EVERY WEEK AS DIRECTED 2 mL 0  . [DISCONTINUED] citalopram (CELEXA) 10 MG tablet Take 1 tablet (10 mg total) by mouth daily. 90 tablet 0   No current facility-administered medications on file prior to visit.      Objective:  Objective  Physical Exam  Constitutional: He is oriented to person, place, and time. He appears well-developed and well-nourished.  HENT:  Right  Ear: External ear normal.  Left Ear: External ear normal.  Nose: Mucosal edema present. Right sinus exhibits no maxillary sinus tenderness and no frontal sinus tenderness. Left sinus exhibits no maxillary sinus tenderness and no frontal sinus tenderness.  + PND + errythema  Eyes: Conjunctivae are normal. Right eye exhibits no discharge. Left eye exhibits no discharge.  Cardiovascular: Normal rate, regular rhythm and normal heart sounds.  No murmur heard. Pulmonary/Chest: Effort normal and breath sounds normal. No respiratory distress. He has no wheezes. He has no rales. He exhibits no tenderness.  Musculoskeletal: He exhibits no edema.  Lymphadenopathy:    He has no cervical adenopathy.  Neurological: He is alert and oriented to person, place, and time.  Nursing note and vitals reviewed.  BP 133/72 (BP Location: Right Arm, Patient Position: Sitting, Cuff Size: Small)   Pulse 79   Temp 97.8 F (36.6 C) (Oral)   Resp 16   Ht 5\' 10"  (1.778 m)   Wt 202 lb (91.6 kg)   SpO2 100%   BMI 28.98 kg/m  Wt Readings from Last 3 Encounters:  01/02/18 202 lb (91.6 kg)  11/02/17 197 lb 12.8 oz (89.7 kg)  10/30/17 198 lb 6.4 oz (90 kg)     Lab Results  Component Value Date   WBC 6.4 10/30/2017   HGB 13.7 10/30/2017   HCT 40.0 10/30/2017   PLT 179.0 10/30/2017   GLUCOSE 120 (H) 10/30/2017   CHOL 114 01/06/2017   TRIG 104.0 01/06/2017   HDL 55.70 01/06/2017   LDLDIRECT 45.0 06/27/2016   LDLCALC 38 01/06/2017   ALT 20 01/06/2017   AST 25 01/06/2017   NA 137 10/30/2017   K 4.2 10/30/2017   CL 101 10/30/2017   CREATININE 0.90 10/30/2017   BUN 14 10/30/2017   CO2 25 10/30/2017   TSH 1.61 06/27/2016   PSA 0.44 10/30/2017   HGBA1C 6.5 (A) 11/02/2017   MICROALBUR <0.7 06/27/2016    No results found.   Assessment & Plan:  Plan  I am having Bruce Romero "[place]" start on predniSONE and HYDROcodone-homatropine. I am also having him maintain his Vitamin D, esomeprazole, niacin,  co-enzyme Q-10, fluticasone, Insulin Pen Needle, moxifloxacin, albuterol, glucose blood, candesartan, metFORMIN, atorvastatin, ALPRAZolam, meloxicam, tamsulosin, linaclotide, amLODipine, dicyclomine, TRULICITY, and FLUAD.  Meds ordered this encounter  Medications  . predniSONE (DELTASONE) 10 MG tablet    Sig: TAKE 3 TABLETS PO QD FOR 3 DAYS THEN TAKE 2 TABLETS PO QD FOR 3 DAYS THEN TAKE 1 TABLET PO QD FOR 3 DAYS THEN TAKE 1/2 TAB PO QD FOR 3 DAYS    Dispense:  20 tablet    Refill:  0  . HYDROcodone-homatropine (HYCODAN) 5-1.5 MG/5ML syrup    Sig: Take 5 mLs by  mouth every 8 (eight) hours as needed for cough.    Dispense:  120 mL    Refill:  0    Problem List Items Addressed This Visit    None    Visit Diagnoses    Cough    -  Primary   Relevant Medications   HYDROcodone-homatropine (HYCODAN) 5-1.5 MG/5ML syrup   Seasonal allergies       Relevant Medications   predniSONE (DELTASONE) 10 MG tablet   HYDROcodone-homatropine (HYCODAN) 5-1.5 MG/5ML syrup    con't flonase and antihistamine rto prn   Follow-up: Return if symptoms worsen or fail to improve.  Ann Held, DO

## 2018-01-15 ENCOUNTER — Other Ambulatory Visit: Payer: Self-pay | Admitting: Endocrinology

## 2018-01-15 DIAGNOSIS — N3000 Acute cystitis without hematuria: Secondary | ICD-10-CM | POA: Diagnosis not present

## 2018-01-15 DIAGNOSIS — N411 Chronic prostatitis: Secondary | ICD-10-CM | POA: Diagnosis not present

## 2018-01-30 ENCOUNTER — Other Ambulatory Visit: Payer: Self-pay | Admitting: Endocrinology

## 2018-01-30 ENCOUNTER — Other Ambulatory Visit: Payer: Self-pay | Admitting: Family Medicine

## 2018-02-05 DIAGNOSIS — N3000 Acute cystitis without hematuria: Secondary | ICD-10-CM | POA: Diagnosis not present

## 2018-02-05 DIAGNOSIS — N411 Chronic prostatitis: Secondary | ICD-10-CM | POA: Diagnosis not present

## 2018-02-13 ENCOUNTER — Other Ambulatory Visit: Payer: Self-pay | Admitting: Endocrinology

## 2018-02-19 ENCOUNTER — Telehealth: Payer: Self-pay | Admitting: Dietician

## 2018-02-19 NOTE — Telephone Encounter (Signed)
Returned patient call. Patient states that he has almost all of the symptoms of gastroparesis.  He complains that he gets very full and bloated even at the beginning of the meal.   States that he has had long term constipation. Discussed to avoid high fat and hard to digest foods.  Discussed that symptoms may be related to another cause other than gastroparesis. Mailed patient a copy of Gastroparesis Nutrition Therapy. Patient states that he wants to try this to see if it helps his symptoms and that if the symptoms persist, he will call for an appointment.  He seeds Dr. Loanne Drilling in a couple of months.  Patient to call for any questions or concerns.  Antonieta Iba, RD, LDN, CDE

## 2018-03-20 ENCOUNTER — Other Ambulatory Visit: Payer: Self-pay | Admitting: Endocrinology

## 2018-03-26 DIAGNOSIS — M542 Cervicalgia: Secondary | ICD-10-CM | POA: Diagnosis not present

## 2018-03-30 ENCOUNTER — Ambulatory Visit (INDEPENDENT_AMBULATORY_CARE_PROVIDER_SITE_OTHER): Payer: Medicare Other | Admitting: Family Medicine

## 2018-03-30 ENCOUNTER — Other Ambulatory Visit: Payer: Self-pay | Admitting: *Deleted

## 2018-03-30 ENCOUNTER — Encounter: Payer: Self-pay | Admitting: Family Medicine

## 2018-03-30 VITALS — BP 154/92 | HR 111 | Resp 14 | Ht 70.0 in | Wt 205.0 lb

## 2018-03-30 DIAGNOSIS — I10 Essential (primary) hypertension: Secondary | ICD-10-CM | POA: Diagnosis not present

## 2018-03-30 DIAGNOSIS — G4700 Insomnia, unspecified: Secondary | ICD-10-CM

## 2018-03-30 DIAGNOSIS — E1169 Type 2 diabetes mellitus with other specified complication: Secondary | ICD-10-CM | POA: Insufficient documentation

## 2018-03-30 DIAGNOSIS — E1165 Type 2 diabetes mellitus with hyperglycemia: Secondary | ICD-10-CM

## 2018-03-30 DIAGNOSIS — F419 Anxiety disorder, unspecified: Secondary | ICD-10-CM | POA: Diagnosis not present

## 2018-03-30 DIAGNOSIS — G2581 Restless legs syndrome: Secondary | ICD-10-CM | POA: Diagnosis not present

## 2018-03-30 DIAGNOSIS — E785 Hyperlipidemia, unspecified: Secondary | ICD-10-CM

## 2018-03-30 LAB — COMPREHENSIVE METABOLIC PANEL
ALT: 26 U/L (ref 0–53)
AST: 38 U/L — ABNORMAL HIGH (ref 0–37)
Albumin: 4.5 g/dL (ref 3.5–5.2)
Alkaline Phosphatase: 56 U/L (ref 39–117)
BUN: 11 mg/dL (ref 6–23)
CO2: 26 mEq/L (ref 19–32)
Calcium: 8.9 mg/dL (ref 8.4–10.5)
Chloride: 99 mEq/L (ref 96–112)
Creatinine, Ser: 0.89 mg/dL (ref 0.40–1.50)
GFR: 83.64 mL/min (ref >60.00)
Glucose, Bld: 181 mg/dL — ABNORMAL HIGH (ref 70–99)
Potassium: 3.6 mEq/L (ref 3.5–5.1)
SODIUM: 139 meq/L (ref 135–145)
Total Bilirubin: 0.7 mg/dL (ref 0.2–1.2)
Total Protein: 7 g/dL (ref 6.0–8.3)

## 2018-03-30 LAB — CBC WITH DIFFERENTIAL/PLATELET
Basophils Absolute: 0.1 10*3/uL (ref 0.0–0.1)
Basophils Relative: 0.8 % (ref 0.0–3.0)
Eosinophils Absolute: 0.5 10*3/uL (ref 0.0–0.7)
Eosinophils Relative: 7.3 % — ABNORMAL HIGH (ref 0.0–5.0)
HCT: 41.6 % (ref 39.0–52.0)
Hemoglobin: 14.2 g/dL (ref 13.0–17.0)
Lymphocytes Relative: 33.8 % (ref 12.0–46.0)
Lymphs Abs: 2.5 10*3/uL (ref 0.7–4.0)
MCHC: 34.1 g/dL (ref 30.0–36.0)
MCV: 86.7 fl (ref 78.0–100.0)
MONO ABS: 0.6 10*3/uL (ref 0.1–1.0)
Monocytes Relative: 7.8 % (ref 3.0–12.0)
Neutro Abs: 3.7 10*3/uL (ref 1.4–7.7)
Neutrophils Relative %: 50.3 % (ref 43.0–77.0)
Platelets: 191 10*3/uL (ref 150.0–400.0)
RBC: 4.8 Mil/uL (ref 4.22–5.81)
RDW: 14 % (ref 11.5–15.5)
WBC: 7.3 10*3/uL (ref 4.0–10.5)

## 2018-03-30 LAB — IBC PANEL
IRON: 124 ug/dL (ref 42–165)
Saturation Ratios: 27.3 % (ref 20.0–50.0)
Transferrin: 325 mg/dL (ref 212.0–360.0)

## 2018-03-30 LAB — LIPID PANEL
Cholesterol: 135 mg/dL (ref 0–200)
HDL: 41.3 mg/dL (ref >39.00)
NonHDL: 94.18
Total CHOL/HDL Ratio: 3
Triglycerides: 275 mg/dL — ABNORMAL HIGH (ref 0.0–149.0)
VLDL: 55 mg/dL — AB (ref 0.0–40.0)

## 2018-03-30 LAB — LDL CHOLESTEROL, DIRECT: Direct LDL: 74 mg/dL

## 2018-03-30 LAB — HEMOGLOBIN A1C: HEMOGLOBIN A1C: 8 % — AB (ref 4.6–6.5)

## 2018-03-30 LAB — FERRITIN: Ferritin: 27.6 ng/mL (ref 22.0–322.0)

## 2018-03-30 MED ORDER — ROPINIROLE HCL 0.25 MG PO TABS: ORAL_TABLET | ORAL | 0 refills | Status: DC

## 2018-03-30 MED ORDER — CITALOPRAM HYDROBROMIDE 10 MG PO TABS: 10.0000 mg | ORAL_TABLET | Freq: Every day | ORAL | 0 refills | Status: DC

## 2018-03-30 NOTE — Assessment & Plan Note (Signed)
Slightly high -- pt is very anxious today and stressed Pt bp at home much better per pt

## 2018-03-30 NOTE — Assessment & Plan Note (Signed)
Start requip Check labs

## 2018-03-30 NOTE — Patient Instructions (Signed)

## 2018-03-30 NOTE — Progress Notes (Signed)
Patient ID: Bruce Romero, male    DOB: Sep 29, 1945  Age: 73 y.o. MRN: [id number]    Subjective:  Subjective  HPI Bruce Romero presents for insomnia---he has trouble falling asleep.  He feels it may be from anxiety/ depression , rls and arthritis pain.  He went to ortho Monday because of neck pain.  He was dx with arthritis in his neck and muscle tightness.  He was given flexeril for night time.  It makes him too groggy  Review of Systems  Constitutional: Negative for appetite change, diaphoresis, fatigue and unexpected weight change.  Eyes: Negative for pain, redness and visual disturbance.  Respiratory: Negative for cough, chest tightness, shortness of breath and wheezing.   Cardiovascular: Negative for chest pain, palpitations and leg swelling.  Endocrine: Negative for cold intolerance, heat intolerance, polydipsia, polyphagia and polyuria.  Genitourinary: Negative for difficulty urinating, dysuria and frequency.  Musculoskeletal:       Restless legs  Neurological: Negative for dizziness, light-headedness, numbness and headaches.  Psychiatric/Behavioral: Positive for dysphoric mood and sleep disturbance. Negative for agitation, behavioral problems, confusion, decreased concentration and self-injury. The patient is nervous/anxious.     History Past Medical History:  Diagnosis Date  . Acute medial meniscal tear, left, initial encounter   . Anxiety   . Diabetes mellitus   . GERD (gastroesophageal reflux disease)   . Hyperlipidemia   . Hypertension   . Irritable bowel   . Spastic colon    anaphylaxis due to [place]    He has a past surgical history that includes Cholecystectomy; Nasal sinus surgery; Knee arthroscopy with medial menisectomy (Left, 01/15/2016); and Chondroplasty (Left, 01/15/2016).   His family history includes Diabetes in his brother; Pulmonary embolism in his father; Stroke in his mother.He reports that he has quit smoking. He has never used smokeless  tobacco. He reports current alcohol use. He reports that he does not use drugs.  Current Outpatient Medications on File Prior to Visit  Medication Sig Dispense Refill  . albuterol (PROAIR HFA) 108 (90 Base) MCG/ACT inhaler Inhale 2 puffs every 6 (six) hours as needed into the lungs for wheezing. 1 Inhaler 0  . ALPRAZolam (XANAX) 0.25 MG tablet take 1 tablet by mouth three times a day if needed 30 tablet 0  . amLODipine (NORVASC) 10 MG tablet TAKE 1 TABLET BY MOUTH EVERY DAY 90 tablet 0  . atorvastatin (LIPITOR) 10 MG tablet Take 1 tablet (10 mg total) by mouth daily. Need to follow up for lab work (Patient taking differently: Take 10 mg by mouth 3 (three) times a week. Due to joint pains) 90 tablet 0  . candesartan (ATACAND) 16 MG tablet TAKE 1 TABLET BY MOUTH AT BEDTIME 90 tablet 0  . Cholecalciferol (VITAMIN D) 2000 UNITS tablet Take 2,000 Units by mouth daily.      Marland Kitchen co-enzyme Q-10 30 MG capsule Take 100 mg by mouth daily.    Marland Kitchen dicyclomine (BENTYL) 20 MG tablet TAKE 1 TABLET BY MOUTH EVERY 6 HOURS 60 tablet 5  . esomeprazole (NEXIUM) 40 MG capsule Take 20 mg by mouth daily.    Marland Kitchen FLUAD 0.5 ML SUSY ADM 0.5ML IM UTD  0  . fluticasone (FLONASE) 50 MCG/ACT nasal spray Place into both nostrils daily.    Marland Kitchen glucose blood (ONETOUCH VERIO) test strip 1 each by Other route daily. And lancets 1/day 100 each 12  . Insulin Pen Needle 32G X 6 MM MISC Use as directed with insulin pen 30 each 3  .  linaclotide (LINZESS) 290 MCG CAPS capsule take 1 capsule by mouth once daily BEFORE BREAKFAST 90 capsule 0  . meloxicam (MOBIC) 7.5 MG tablet Take 1 tablet (7.5 mg total) by mouth daily. 14 tablet 0  . metFORMIN (GLUCOPHAGE-XR) 750 MG 24 hr tablet TAKE 1 TABLET BY MOUTH TWICE A DAY WITH MEALS 180 tablet 0  . moxifloxacin (VIGAMOX) 0.5 % ophthalmic solution Place 1 drop into both eyes 3 (three) times daily. 3 mL 0  . niacin (SLO-NIACIN) 500 MG tablet Take 500 mg by mouth at bedtime.    . predniSONE (DELTASONE) 10  MG tablet TAKE 3 TABLETS PO QD FOR 3 DAYS THEN TAKE 2 TABLETS PO QD FOR 3 DAYS THEN TAKE 1 TABLET PO QD FOR 3 DAYS THEN TAKE 1/2 TAB PO QD FOR 3 DAYS 20 tablet 0  . tamsulosin (FLOMAX) 0.4 MG CAPS capsule Take 1 capsule (0.4 mg total) by mouth daily. 30 capsule 3  . TRULICITY 1.5 FI/4.3PI SOPN INJECT 1 PEN UNDER THE SKIN EVERY WEEK AS DIRECTED 2 mL 0   No current facility-administered medications on file prior to visit.      Objective:  Objective  Physical Exam Vitals signs and nursing note reviewed.  Constitutional:      General: He is sleeping.     Appearance: He is well-developed.  HENT:     Head: Normocephalic and atraumatic.  Eyes:     Pupils: Pupils are equal, round, and reactive to light.  Neck:     Musculoskeletal: Normal range of motion and neck supple.     Thyroid: No thyromegaly.  Cardiovascular:     Rate and Rhythm: Normal rate and regular rhythm.     Heart sounds: No murmur.  Pulmonary:     Effort: Pulmonary effort is normal. No respiratory distress.     Breath sounds: Normal breath sounds. No wheezing or rales.  Chest:     Chest wall: No tenderness.  Musculoskeletal:        General: No tenderness.  Skin:    General: Skin is warm and dry.  Neurological:     Mental Status: He is oriented to person, place, and time.  Psychiatric:        Mood and Affect: Mood is anxious and depressed. Affect is not tearful.        Behavior: Behavior normal.        Thought Content: Thought content normal. Thought content does not include homicidal or suicidal ideation.        Judgment: Judgment normal.    BP (!) 154/92   Pulse (!) 111   Resp 14   Ht 5\' 10"  (1.778 m)   Wt 205 lb (93 kg)   SpO2 95%   BMI 29.41 kg/m  Wt Readings from Last 3 Encounters:  03/30/18 205 lb (93 kg)  01/02/18 202 lb (91.6 kg)  11/02/17 197 lb 12.8 oz (89.7 kg)     Lab Results  Component Value Date   WBC 6.4 10/30/2017   HGB 13.7 10/30/2017   HCT 40.0 10/30/2017   PLT 179.0 10/30/2017    GLUCOSE 120 (H) 10/30/2017   CHOL 114 01/06/2017   TRIG 104.0 01/06/2017   HDL 55.70 01/06/2017   LDLDIRECT 45.0 06/27/2016   LDLCALC 38 01/06/2017   ALT 20 01/06/2017   AST 25 01/06/2017   NA 137 10/30/2017   K 4.2 10/30/2017   CL 101 10/30/2017   CREATININE 0.90 10/30/2017   BUN 14 10/30/2017   CO2 25  10/30/2017   TSH 1.61 06/27/2016   PSA 0.44 10/30/2017   HGBA1C 6.5 (A) 11/02/2017   MICROALBUR <0.7 06/27/2016    No results found.   Assessment & Plan:  Plan  I have discontinued Dominica Severin L. Hane "[name]"'s HYDROcodone-homatropine. I am also having him start on rOPINIRole. Additionally, I am having him maintain his Vitamin D, esomeprazole, niacin, co-enzyme Q-10, fluticasone, Insulin Pen Needle, moxifloxacin, albuterol, glucose blood, atorvastatin, ALPRAZolam, meloxicam, tamsulosin, linaclotide, dicyclomine, FLUAD, predniSONE, candesartan, metFORMIN, amLODipine, TRULICITY, and citalopram.  Meds ordered this encounter  Medications  . citalopram (CELEXA) 10 MG tablet    Sig: Take 1 tablet (10 mg total) by mouth daily.    Dispense:  90 tablet    Refill:  0  . rOPINIRole (REQUIP) 0.25 MG tablet    Sig: 1 po qhs x 3 days then increase to 2 po qhs    Dispense:  60 tablet    Refill:  0    Problem List Items Addressed This Visit      Unprioritized   Anxiety - Primary   Relevant Medications   citalopram (CELEXA) 10 MG tablet   Essential hypertension    Slightly high -- pt is very anxious today and stressed Pt bp at home much better per pt      Relevant Orders   Lipid panel   Comprehensive metabolic panel   Hyperlipidemia    Tolerating statin, encouraged heart healthy diet, avoid trans fats, minimize simple carbs and saturated fats. Increase exercise as tolerated      Relevant Orders   Lipid panel   Comprehensive metabolic panel   Insomnia    Restart celexa  He has xanax if needed rto 1 month      RLS (restless legs syndrome)    Start requip Check labs         Relevant Medications   rOPINIRole (REQUIP) 0.25 MG tablet   Other Relevant Orders   CBC with Differential/Platelet   IBC panel   Ferritin   Uncontrolled type 2 diabetes mellitus with hyperglycemia (HCC)    Check labs F/u endo      Relevant Orders   Lipid panel   Comprehensive metabolic panel   Hemoglobin A1c      Follow-up: Return in about 4 weeks (around 04/27/2018), or if symptoms worsen or fail to improve, for anxiety / depression , insomnia.  Ann Held, DO

## 2018-03-30 NOTE — Assessment & Plan Note (Signed)
Restart celexa  He has xanax if needed rto 1 month

## 2018-03-30 NOTE — Assessment & Plan Note (Signed)
Check labs F/u endo  

## 2018-03-30 NOTE — Assessment & Plan Note (Signed)
Tolerating statin, encouraged heart healthy diet, avoid trans fats, minimize simple carbs and saturated fats. Increase exercise as tolerated 

## 2018-04-02 ENCOUNTER — Other Ambulatory Visit: Payer: Self-pay | Admitting: Family Medicine

## 2018-04-02 DIAGNOSIS — E781 Pure hyperglyceridemia: Secondary | ICD-10-CM

## 2018-04-02 MED ORDER — ICOSAPENT ETHYL 1 G PO CAPS: ORAL_CAPSULE | ORAL | 2 refills | Status: DC

## 2018-04-04 ENCOUNTER — Encounter: Payer: Self-pay | Admitting: Family Medicine

## 2018-04-12 ENCOUNTER — Other Ambulatory Visit: Payer: Self-pay | Admitting: Endocrinology

## 2018-04-12 ENCOUNTER — Telehealth: Payer: Self-pay | Admitting: *Deleted

## 2018-04-12 NOTE — Telephone Encounter (Signed)
Pt should have f/u 4-6 weeks after last ov to f/u

## 2018-04-12 NOTE — Telephone Encounter (Signed)
Patient states that he has been on celexa since the 21st and he is having trouble going to sleep.  He had wanted [place] to make note of that, otherwise he is doing ok.Marland Kitchen

## 2018-04-13 NOTE — Telephone Encounter (Signed)
Patient notified and he will follow with Dr. Loanne Drilling first then he will call to schedule with [place].

## 2018-04-17 ENCOUNTER — Telehealth: Payer: Self-pay | Admitting: *Deleted

## 2018-04-17 ENCOUNTER — Other Ambulatory Visit: Payer: Self-pay | Admitting: Family Medicine

## 2018-04-17 DIAGNOSIS — F411 Generalized anxiety disorder: Secondary | ICD-10-CM

## 2018-04-17 MED ORDER — ALPRAZOLAM 0.25 MG PO TABS: ORAL_TABLET | ORAL | 0 refills | Status: DC

## 2018-04-17 NOTE — Telephone Encounter (Signed)
[facility] [place] requesting refill for alprazolam  Last written:  Last ov:03/30/18 Next ov: none has awv on 07/13/18 Contract: none UDS: none

## 2018-04-24 ENCOUNTER — Other Ambulatory Visit: Payer: Self-pay | Admitting: Endocrinology

## 2018-04-24 ENCOUNTER — Other Ambulatory Visit: Payer: Self-pay | Admitting: Family Medicine

## 2018-04-25 ENCOUNTER — Encounter: Payer: Self-pay | Admitting: Family Medicine

## 2018-05-03 ENCOUNTER — Ambulatory Visit: Payer: Medicare Other | Admitting: Endocrinology

## 2018-05-22 ENCOUNTER — Other Ambulatory Visit: Payer: Self-pay | Admitting: Endocrinology

## 2018-05-22 ENCOUNTER — Other Ambulatory Visit: Payer: Self-pay | Admitting: Family Medicine

## 2018-05-29 ENCOUNTER — Ambulatory Visit (INDEPENDENT_AMBULATORY_CARE_PROVIDER_SITE_OTHER): Payer: Medicare Other | Admitting: Family Medicine

## 2018-05-29 ENCOUNTER — Other Ambulatory Visit: Payer: Self-pay

## 2018-05-29 ENCOUNTER — Encounter: Payer: Self-pay | Admitting: Family Medicine

## 2018-05-29 DIAGNOSIS — J40 Bronchitis, not specified as acute or chronic: Secondary | ICD-10-CM | POA: Diagnosis not present

## 2018-05-29 MED ORDER — HYDROCOD POLST-CPM POLST ER 10-8 MG/5ML PO SUER: 5.0000 mL | Freq: Every evening | ORAL | 0 refills | Status: DC | PRN

## 2018-05-29 MED ORDER — AZITHROMYCIN 250 MG PO TABS: ORAL_TABLET | ORAL | 0 refills | Status: DC

## 2018-05-29 NOTE — Progress Notes (Signed)
Virtual Visit via Video Note  I connected with Bruce Romero on 05/29/18 at  3:15 PM EDT by a video enabled telemedicine application and verified that I am speaking with the correct person using two identifiers.   I discussed the limitations of evaluation and management by telemedicine and the availability of in person appointments. The patient expressed understanding and agreed to proceed.  History of Present Illness: Pt is home c/o watery eyes and cough and chest congestion --- cough is productive -- symptoms x 10 days   He is sleeping well    Observations/Objective: 97.4  Wt 197 lb po 98%  p 105   Pt is in NAD No sob  Assessment and Plan: 1. Bronchitis meds per orders con't flonase and antihistamine - azithromycin (ZITHROMAX Z-PAK) 250 MG tablet; As directed  Dispense: 6 each; Refill: 0 - chlorpheniramine-HYDROcodone (TUSSIONEX PENNKINETIC ER) 10-8 MG/5ML SUER; Take 5 mLs by mouth at bedtime as needed for cough.  Dispense: 140 mL; Refill: 0   Follow Up Instructions:    I discussed the assessment and treatment plan with the patient. The patient was provided an opportunity to ask questions and all were answered. The patient agreed with the plan and demonstrated an understanding of the instructions.   The patient was advised to call back or seek an in-person evaluation if the symptoms worsen or if the condition fails to improve as anticipated.  I provided 15 minutes of non-face-to-face time during this encounter.   Ann Held, DO   .

## 2018-06-13 ENCOUNTER — Ambulatory Visit: Payer: Medicare Other | Admitting: Endocrinology

## 2018-06-19 ENCOUNTER — Encounter: Payer: Self-pay | Admitting: Endocrinology

## 2018-06-19 ENCOUNTER — Other Ambulatory Visit: Payer: Self-pay | Admitting: Endocrinology

## 2018-06-20 ENCOUNTER — Telehealth: Payer: Self-pay | Admitting: Endocrinology

## 2018-06-20 ENCOUNTER — Ambulatory Visit (INDEPENDENT_AMBULATORY_CARE_PROVIDER_SITE_OTHER): Payer: Medicare Other | Admitting: Endocrinology

## 2018-06-20 DIAGNOSIS — E1140 Type 2 diabetes mellitus with diabetic neuropathy, unspecified: Secondary | ICD-10-CM

## 2018-06-20 DIAGNOSIS — E119 Type 2 diabetes mellitus without complications: Secondary | ICD-10-CM

## 2018-06-20 MED ORDER — CANAGLIFLOZIN 100 MG PO TABS: 100.0000 mg | ORAL_TABLET | Freq: Every day | ORAL | 11 refills | Status: DC

## 2018-06-20 MED ORDER — DULAGLUTIDE 1.5 MG/0.5ML ~~LOC~~ SOAJ: 1.5000 mg | SUBCUTANEOUS | 11 refills | Status: DC

## 2018-06-20 NOTE — Telephone Encounter (Signed)
Per patient he called insurance and found out that [place] were the preferred meds instead of Trulicity.  Patient would like a call to know how to proceed. He would like a 90 day supply if possible to [facility]

## 2018-06-20 NOTE — Progress Notes (Signed)
Subjective:    Patient ID: Bruce Romero, male    DOB: 11-07-1945, 73 y.o.   MRN: [id number]  HPI  telehealth visit today via doxy video visit.  Alternatives to telehealth are presented to this patient, and the patient agrees to the telehealth visit. Pt is advised of the cost of the visit, and agrees to this, also.   Patient is at home, and I am at the office.   Persons attending the telehealth visit: the patient and I Pt returns for f/u of diabetes mellitus:  DM type: 2 Dx'ed: 7893 Complications: polyneuropathy.  Therapy: trulicity and metformin.   DKA: never.  Severe hypoglycemia: never.   Pancreatitis: never Other: he has never been on insulin.    Interval history:  pt states he feels well in general.  He says cbg's are in the low to mid-100's.   Past Medical History:  Diagnosis Date  . Acute medial meniscal tear, left, initial encounter   . Anxiety   . Diabetes mellitus   . GERD (gastroesophageal reflux disease)   . Hyperlipidemia   . Hypertension   . Irritable bowel   . Spastic colon    anaphylaxis due to [place]    Past Surgical History:  Procedure Laterality Date  . CHOLECYSTECTOMY    . CHONDROPLASTY Left 01/15/2016   Procedure: CHONDROPLASTY;  Surgeon: Melrose Nakayama, MD;  Location: [place] [facility];  Service: Orthopedics;  Laterality: Left;  . KNEE ARTHROSCOPY WITH MEDIAL MENISECTOMY Left 01/15/2016   Procedure: ARTHROSCOPY LEFT KNEE WITH PARTIAL MEDIAL MENISECTOMY;  Surgeon: Melrose Nakayama, MD;  Location: [place];  Service: Orthopedics;  Laterality: Left;  . NASAL SINUS SURGERY      Social History   Socioeconomic History  . Marital status: Married    Spouse name: Not on file  . Number of children: Not on file  . Years of education: Not on file  . Highest education level: Not on file  Occupational History  . Occupation: retired  Scientific laboratory technician  . Financial resource strain: Not on file  . Food insecurity:    Worry: Not on  file    Inability: Not on file  . Transportation needs:    Medical: Not on file    Non-medical: Not on file  Tobacco Use  . Smoking status: Former Research scientist (life sciences)  . Smokeless tobacco: Never Used  Substance and Sexual Activity  . Alcohol use: Yes    Alcohol/week: 0.0 standard drinks    Comment: social  . Drug use: No  . Sexual activity: Yes  Lifestyle  . Physical activity:    Days per week: Not on file    Minutes per session: Not on file  . Stress: Not on file  Relationships  . Social connections:    Talks on phone: Not on file    Gets together: Not on file    Attends religious service: Not on file    Active member of club or organization: Not on file    Attends meetings of clubs or organizations: Not on file    Relationship status: Not on file  . Intimate partner violence:    Fear of current or ex partner: Not on file    Emotionally abused: Not on file    Physically abused: Not on file    Forced sexual activity: Not on file  Other Topics Concern  . Not on file  Social History Narrative   Exercise try to everyday.    Current Outpatient Medications  on File Prior to Visit  Medication Sig Dispense Refill  . albuterol (PROAIR HFA) 108 (90 Base) MCG/ACT inhaler Inhale 2 puffs every 6 (six) hours as needed into the lungs for wheezing. 1 Inhaler 0  . ALPRAZolam (XANAX) 0.25 MG tablet take 1 tablet by mouth three times a day if needed 30 tablet 0  . amLODipine (NORVASC) 10 MG tablet TAKE 1 TABLET BY MOUTH EVERY DAY 90 tablet 0  . atorvastatin (LIPITOR) 10 MG tablet Take 1 tablet (10 mg total) by mouth daily. Need to follow up for lab work (Patient taking differently: Take 10 mg by mouth 3 (three) times a week. Due to joint pains) 90 tablet 0  . candesartan (ATACAND) 16 MG tablet TAKE 1 TABLET BY MOUTH AT BEDTIME 90 tablet 0  . Cholecalciferol (VITAMIN D) 2000 UNITS tablet Take 2,000 Units by mouth daily.      . citalopram (CELEXA) 10 MG tablet Take 1 tablet (10 mg total) by mouth daily.  90 tablet 0  . co-enzyme Q-10 30 MG capsule Take 100 mg by mouth daily.    Marland Kitchen dicyclomine (BENTYL) 20 MG tablet TAKE 1 TABLET BY MOUTH EVERY 6 HOURS 60 tablet 5  . esomeprazole (NEXIUM) 40 MG capsule Take 20 mg by mouth daily.    Marland Kitchen FLUAD 0.5 ML SUSY ADM 0.5ML IM UTD  0  . fluticasone (FLONASE) 50 MCG/ACT nasal spray Place into both nostrils daily.    Marland Kitchen glucose blood (ONETOUCH VERIO) test strip 1 each by Other route daily. And lancets 1/day 100 each 12  . Icosapent Ethyl (VASCEPA) 1 g CAPS 2 po bid 120 capsule 2  . Insulin Pen Needle 32G X 6 MM MISC Use as directed with insulin pen 30 each 3  . linaclotide (LINZESS) 290 MCG CAPS capsule take 1 capsule by mouth once daily BEFORE BREAKFAST 90 capsule 0  . meloxicam (MOBIC) 7.5 MG tablet Take 1 tablet (7.5 mg total) by mouth daily. 14 tablet 0  . metFORMIN (GLUCOPHAGE-XR) 750 MG 24 hr tablet TAKE 1 TABLET BY MOUTH TWICE DAILY WITH MEALS 180 tablet 0  . moxifloxacin (VIGAMOX) 0.5 % ophthalmic solution Place 1 drop into both eyes 3 (three) times daily. 3 mL 0  . niacin (SLO-NIACIN) 500 MG tablet Take 500 mg by mouth at bedtime.    Marland Kitchen rOPINIRole (REQUIP) 0.25 MG tablet 1 po qhs x 3 days then increase to 2 po qhs 60 tablet 0  . tamsulosin (FLOMAX) 0.4 MG CAPS capsule Take 1 capsule (0.4 mg total) by mouth daily. 30 capsule 3   No current facility-administered medications on file prior to visit.     Allergies  Allergen Reactions  . Cephalexin Anaphylaxis    REACTION: pt is not allergic to penicillin  . Bactrim [Sulfamethoxazole-Trimethoprim] Other (See Comments)    ? Took Bactrim and Flexeril at same time had lip swelling unsure which caused reaction.  Yvette Rack [Cyclobenzaprine] Swelling    Family History  Problem Relation Age of Onset  . Stroke Mother   . Pulmonary embolism Father   . Diabetes Brother     There were no vitals taken for this visit.   Review of Systems He denies hypoglycemia.     Objective:   Physical Exam    Lab  Results  Component Value Date   HGBA1C 8.0 (H) 03/30/2018   Lab Results  Component Value Date   CREATININE 0.89 03/30/2018   BUN 11 03/30/2018   NA 139 03/30/2018   K  3.6 03/30/2018   CL 99 03/30/2018   CO2 26 03/30/2018       Assessment & Plan:  Type 2 DM, with PN: worse.    Patient Instructions  I have sent a prescription to your pharmacy, to add invokana.  Please come back for a follow-up appointment in 2-3 months.   check your blood sugar once a day.  vary the time of day when you check, between before the 3 meals, and at bedtime.  also check if you have symptoms of your blood sugar being too high or too low.  please keep a record of the readings and bring it to your next appointment here (or you can bring the meter itself).  You can write it on any piece of paper.  please call us sooner if your blood sugar goes below 70, or if you have a lot of readings over 200.

## 2018-06-20 NOTE — Telephone Encounter (Signed)
Please advise 

## 2018-06-20 NOTE — Patient Instructions (Addendum)
I have sent a prescription to your pharmacy, to add invokana.  Please come back for a follow-up appointment in 2-3 months.   check your blood sugar once a day.  vary the time of day when you check, between before the 3 meals, and at bedtime.  also check if you have symptoms of your blood sugar being too high or too low.  please keep a record of the readings and bring it to your next appointment here (or you can bring the meter itself).  You can write it on any piece of paper.  please call us sooner if your blood sugar goes below 70, or if you have a lot of readings over 200.

## 2018-06-20 NOTE — Telephone Encounter (Signed)
These meds are in different category.  meds in the same category are victoza, ozempic, and byetta.

## 2018-06-21 NOTE — Telephone Encounter (Signed)
Called pt and informed Jardiance and Januvia are not in the same classification as Trulicity. Advised Victoza, Ozempic, and Byetta are but would need to know which is covered by his plan. Pt further added, Trulicity is covered but he is in the donut hole. However, will check with insurance to determine if one of the 3 listed above might be more affordable at this time. Will call to let us know. Otherwise, will remain on Trulicity.

## 2018-06-27 ENCOUNTER — Other Ambulatory Visit: Payer: Self-pay | Admitting: Endocrinology

## 2018-06-28 ENCOUNTER — Other Ambulatory Visit: Payer: Self-pay | Admitting: Family Medicine

## 2018-06-28 DIAGNOSIS — E781 Pure hyperglyceridemia: Secondary | ICD-10-CM

## 2018-06-28 DIAGNOSIS — F419 Anxiety disorder, unspecified: Secondary | ICD-10-CM

## 2018-07-01 ENCOUNTER — Encounter: Payer: Self-pay | Admitting: Endocrinology

## 2018-07-03 ENCOUNTER — Telehealth: Payer: Self-pay

## 2018-07-03 NOTE — Telephone Encounter (Signed)
Pt sent MyChart message with complaints about having adverse reactions to Invokana. Per Dr. Loanne Drilling, VV is required to further address. LVM requesting returned call re: need to schedule appt.

## 2018-07-04 ENCOUNTER — Other Ambulatory Visit: Payer: Self-pay

## 2018-07-04 ENCOUNTER — Ambulatory Visit (INDEPENDENT_AMBULATORY_CARE_PROVIDER_SITE_OTHER): Payer: Medicare Other | Admitting: Endocrinology

## 2018-07-04 DIAGNOSIS — E1165 Type 2 diabetes mellitus with hyperglycemia: Secondary | ICD-10-CM

## 2018-07-04 DIAGNOSIS — N481 Balanitis: Secondary | ICD-10-CM

## 2018-07-04 DIAGNOSIS — E119 Type 2 diabetes mellitus without complications: Secondary | ICD-10-CM | POA: Diagnosis not present

## 2018-07-04 MED ORDER — FLUCONAZOLE 100 MG PO TABS: 100.0000 mg | ORAL_TABLET | Freq: Every day | ORAL | 0 refills | Status: DC

## 2018-07-04 MED ORDER — BROMOCRIPTINE MESYLATE 2.5 MG PO TABS: 1.2500 mg | ORAL_TABLET | Freq: Every day | ORAL | 11 refills | Status: DC

## 2018-07-04 NOTE — Patient Instructions (Addendum)
I have sent a prescription to your pharmacy, for the yeast infection.  Please stop taking the invokana. I have also sent a prescription to your pharmacy, for "bromocriptine," to help your blood sugar. It has possible side effects of nausea and dizziness.  These go away with time.  You can avoid these by taking it at bedtime, and by taking just take 1/2 pill.  Please come back for a follow-up appointment in 2-3 weeks, in person.   check your blood sugar once a day.  vary the time of day when you check, between before the 3 meals, and at bedtime.  also check if you have symptoms of your blood sugar being too high or too low.  please keep a record of the readings and bring it to your next appointment here (or you can bring the meter itself).  You can write it on any piece of paper.  please call us sooner if your blood sugar goes below 70, or if you have a lot of readings over 200.

## 2018-07-04 NOTE — Progress Notes (Signed)
Subjective:    Patient ID: Bruce Romero, male    DOB: 05/28/1945, 73 y.o.   MRN: [id number]  HPI telehealth visit today via doxy video visit.  Alternatives to telehealth are presented to this patient, and the patient agrees to the telehealth visit.  Pt is advised of the cost of the visit, and agrees to this, also.   Patient is at home, and I am at the office.   Persons attending the telehealth visit: the patient and I Pt returns for f/u of diabetes mellitus:  DM type: 2 Dx'ed: 7829 Complications: polyneuropathy.  Therapy: trulicity and 2 oral meds. DKA: never.  Severe hypoglycemia: never.   Pancreatitis: never.  Other: he has never been on insulin.   Interval history:  pt states he feels well in general.  He says cbg's are in the low to mid-100's.  He takes meds as rx'ed.    Past Medical History:  Diagnosis Date  . Acute medial meniscal tear, left, initial encounter   . Anxiety   . Diabetes mellitus   . GERD (gastroesophageal reflux disease)   . Hyperlipidemia   . Hypertension   . Irritable bowel   . Spastic colon    anaphylaxis due to [place]    Past Surgical History:  Procedure Laterality Date  . CHOLECYSTECTOMY    . CHONDROPLASTY Left 01/15/2016   Procedure: CHONDROPLASTY;  Surgeon: Melrose Nakayama, MD;  Location: [place];  Service: Orthopedics;  Laterality: Left;  . KNEE ARTHROSCOPY WITH MEDIAL MENISECTOMY Left 01/15/2016   Procedure: ARTHROSCOPY LEFT KNEE WITH PARTIAL MEDIAL MENISECTOMY;  Surgeon: Melrose Nakayama, MD;  Location: [place];  Service: Orthopedics;  Laterality: Left;  . NASAL SINUS SURGERY      Social History   Socioeconomic History  . Marital status: Married    Spouse name: Not on file  . Number of children: Not on file  . Years of education: Not on file  . Highest education level: Not on file  Occupational History  . Occupation: retired  Scientific laboratory technician  . Financial resource strain: Not on file  . Food  insecurity:    Worry: Not on file    Inability: Not on file  . Transportation needs:    Medical: Not on file    Non-medical: Not on file  Tobacco Use  . Smoking status: Former Research scientist (life sciences)  . Smokeless tobacco: Never Used  Substance and Sexual Activity  . Alcohol use: Yes    Alcohol/week: 0.0 standard drinks    Comment: social  . Drug use: No  . Sexual activity: Yes  Lifestyle  . Physical activity:    Days per week: Not on file    Minutes per session: Not on file  . Stress: Not on file  Relationships  . Social connections:    Talks on phone: Not on file    Gets together: Not on file    Attends religious service: Not on file    Active member of club or organization: Not on file    Attends meetings of clubs or organizations: Not on file    Relationship status: Not on file  . Intimate partner violence:    Fear of current or ex partner: Not on file    Emotionally abused: Not on file    Physically abused: Not on file    Forced sexual activity: Not on file  Other Topics Concern  . Not on file  Social History Narrative   Exercise try to  everyday.    Current Outpatient Medications on File Prior to Visit  Medication Sig Dispense Refill  . albuterol (PROAIR HFA) 108 (90 Base) MCG/ACT inhaler Inhale 2 puffs every 6 (six) hours as needed into the lungs for wheezing. 1 Inhaler 0  . ALPRAZolam (XANAX) 0.25 MG tablet take 1 tablet by mouth three times a day if needed 30 tablet 0  . amLODipine (NORVASC) 10 MG tablet TAKE 1 TABLET BY MOUTH EVERY DAY 90 tablet 0  . atorvastatin (LIPITOR) 10 MG tablet Take 1 tablet (10 mg total) by mouth daily. Need to follow up for lab work (Patient taking differently: Take 10 mg by mouth 3 (three) times a week. Due to joint pains) 90 tablet 0  . candesartan (ATACAND) 16 MG tablet TAKE 1 TABLET BY MOUTH AT BEDTIME 90 tablet 0  . Cholecalciferol (VITAMIN D) 2000 UNITS tablet Take 2,000 Units by mouth daily.      . citalopram (CELEXA) 10 MG tablet TAKE 1  TABLET(10 MG) BY MOUTH DAILY 90 tablet 0  . co-enzyme Q-10 30 MG capsule Take 100 mg by mouth daily.    Marland Kitchen dicyclomine (BENTYL) 20 MG tablet TAKE 1 TABLET BY MOUTH EVERY 6 HOURS 60 tablet 5  . Dulaglutide (TRULICITY) 1.5 YD/7.4JO SOPN Inject 1.5 mg into the skin once a week. 4 pen 11  . esomeprazole (NEXIUM) 40 MG capsule Take 20 mg by mouth daily.    Marland Kitchen FLUAD 0.5 ML SUSY ADM 0.5ML IM UTD  0  . fluticasone (FLONASE) 50 MCG/ACT nasal spray Place into both nostrils daily.    . Insulin Pen Needle 32G X 6 MM MISC Use as directed with insulin pen 30 each 3  . linaclotide (LINZESS) 290 MCG CAPS capsule take 1 capsule by mouth once daily BEFORE BREAKFAST 90 capsule 0  . meloxicam (MOBIC) 7.5 MG tablet Take 1 tablet (7.5 mg total) by mouth daily. 14 tablet 0  . metFORMIN (GLUCOPHAGE-XR) 750 MG 24 hr tablet TAKE 1 TABLET BY MOUTH TWICE DAILY WITH MEALS 180 tablet 0  . moxifloxacin (VIGAMOX) 0.5 % ophthalmic solution Place 1 drop into both eyes 3 (three) times daily. 3 mL 0  . niacin (SLO-NIACIN) 500 MG tablet Take 500 mg by mouth at bedtime.    Glory Rosebush VERIO test strip TEST ONCE DAILY 100 each 2  . rOPINIRole (REQUIP) 0.25 MG tablet 1 po qhs x 3 days then increase to 2 po qhs 60 tablet 0  . tamsulosin (FLOMAX) 0.4 MG CAPS capsule Take 1 capsule (0.4 mg total) by mouth daily. 30 capsule 3  . VASCEPA 1 g CAPS TAKE 2 CAPSULES BY MOUTH TWICE DAILY 120 capsule 2   No current facility-administered medications on file prior to visit.     Allergies  Allergen Reactions  . Cephalexin Anaphylaxis    REACTION: pt is not allergic to penicillin  . Bactrim [Sulfamethoxazole-Trimethoprim] Other (See Comments)    ? Took Bactrim and Flexeril at same time had lip swelling unsure which caused reaction.  Yvette Rack [Cyclobenzaprine] Swelling    Family History  Problem Relation Age of Onset  . Stroke Mother   . Pulmonary embolism Father   . Diabetes Brother      Review of Systems He denies hypoglycemia.       Objective:   Physical Exam     Assessment & Plan:  Type 2 DM: apparently well-controlled.  We discussed rx options.  He wants to hold off on bromocriptine, until he sees how cbg's  are off invokana. Balanitis, new, prob due to invokana.  Patient Instructions  I have sent a prescription to your pharmacy, for the yeast infection.  Please stop taking the invokana. I have also sent a prescription to your pharmacy, for "bromocriptine," to help your blood sugar. It has possible side effects of nausea and dizziness.  These go away with time.  You can avoid these by taking it at bedtime, and by taking just take 1/2 pill.  Please come back for a follow-up appointment in 2-3 weeks, in person.   check your blood sugar once a day.  vary the time of day when you check, between before the 3 meals, and at bedtime.  also check if you have symptoms of your blood sugar being too high or too low.  please keep a record of the readings and bring it to your next appointment here (or you can bring the meter itself).  You can write it on any piece of paper.  please call us sooner if your blood sugar goes below 70, or if you have a lot of readings over 200.

## 2018-07-13 ENCOUNTER — Ambulatory Visit: Payer: Medicare Other | Admitting: *Deleted

## 2018-07-18 ENCOUNTER — Other Ambulatory Visit (INDEPENDENT_AMBULATORY_CARE_PROVIDER_SITE_OTHER): Payer: Medicare Other

## 2018-07-18 ENCOUNTER — Other Ambulatory Visit: Payer: Self-pay

## 2018-07-18 ENCOUNTER — Other Ambulatory Visit: Payer: Self-pay | Admitting: Endocrinology

## 2018-07-18 ENCOUNTER — Other Ambulatory Visit: Payer: Self-pay | Admitting: Family Medicine

## 2018-07-18 DIAGNOSIS — E119 Type 2 diabetes mellitus without complications: Secondary | ICD-10-CM

## 2018-07-18 LAB — LIPID PANEL
Cholesterol: 121 mg/dL (ref 0–200)
HDL: 45.8 mg/dL (ref >39.00)
LDL Cholesterol: 47 mg/dL (ref 0–99)
NonHDL: 75.55
Total CHOL/HDL Ratio: 3
Triglycerides: 141 mg/dL (ref 0.0–149.0)
VLDL: 28.2 mg/dL (ref 0.0–40.0)

## 2018-07-18 LAB — HEMOGLOBIN A1C: Hgb A1c MFr Bld: 7.6 % — ABNORMAL HIGH (ref 4.6–6.5)

## 2018-07-18 MED ORDER — BROMOCRIPTINE MESYLATE 2.5 MG PO TABS: 2.5000 mg | ORAL_TABLET | Freq: Every day | ORAL | 3 refills | Status: DC

## 2018-07-20 ENCOUNTER — Other Ambulatory Visit: Payer: Self-pay | Admitting: Family Medicine

## 2018-07-20 DIAGNOSIS — K589 Irritable bowel syndrome without diarrhea: Secondary | ICD-10-CM

## 2018-07-21 ENCOUNTER — Other Ambulatory Visit: Payer: Self-pay | Admitting: Family Medicine

## 2018-07-21 DIAGNOSIS — K589 Irritable bowel syndrome without diarrhea: Secondary | ICD-10-CM

## 2018-07-22 ENCOUNTER — Encounter: Payer: Self-pay | Admitting: Endocrinology

## 2018-07-23 ENCOUNTER — Encounter: Payer: Self-pay | Admitting: Internal Medicine

## 2018-07-23 ENCOUNTER — Other Ambulatory Visit: Payer: Self-pay

## 2018-07-23 ENCOUNTER — Ambulatory Visit (INDEPENDENT_AMBULATORY_CARE_PROVIDER_SITE_OTHER): Payer: Medicare Other | Admitting: Internal Medicine

## 2018-07-23 ENCOUNTER — Telehealth: Payer: Self-pay | Admitting: Endocrinology

## 2018-07-23 VITALS — BP 152/84 | HR 84 | Temp 97.4°F | Ht 70.0 in | Wt 196.2 lb

## 2018-07-23 DIAGNOSIS — E119 Type 2 diabetes mellitus without complications: Secondary | ICD-10-CM | POA: Diagnosis not present

## 2018-07-23 MED ORDER — CANDESARTAN CILEXETIL 16 MG PO TABS: 16.0000 mg | ORAL_TABLET | Freq: Every day | ORAL | 0 refills | Status: DC

## 2018-07-23 NOTE — Telephone Encounter (Signed)
Patient started on bromocriptine (PARLODEL) 2.5 MG tablet and has been vomiting, stated he called the [facility] and the on call doctor told him to stop taking it until he spoke with DR. Loanne Drilling. Please also review his MyChart message.  Please Advise, Thanks

## 2018-07-23 NOTE — Telephone Encounter (Signed)
Please advise you would like Dr.'s Emeline Darling or Shamleffer to eval pt in your absence

## 2018-07-23 NOTE — Patient Instructions (Signed)
-   Continue Metformin 750 mg XR twice a day with meals - Continue trulicity 1.5 mg weekly    - Please contact our office should your fasting sugar are consistently over 140 mg/dL or if your sugars during the day are consistently over 180 mg/dL.

## 2018-07-23 NOTE — Telephone Encounter (Signed)
Can you please call and schedule this pt for today?

## 2018-07-23 NOTE — Telephone Encounter (Signed)
Could you see this pt as an acute visit in Dr. Cordelia Pen absence, or is there something else you would like to do? Please advise.

## 2018-07-23 NOTE — Progress Notes (Signed)
Name: Bruce Romero  Age/ Sex: 73 y.o., male   MRN/ DOB: [id number], October 18, 1945     PCP: Carollee Herter, Alferd Apa, DO   Reason for Endocrinology Evaluation: Type 2 Diabetes Mellitus  Initial Endocrine Consultative Visit: 04/26/2016    PATIENT IDENTIFIER: Bruce Romero is a 73 y.o. male with a past medical history of T2DM, HTN,and dyslipidemia . The patient has followed with Endocrinology clinic since 04/27/18 for consultative assistance with management of his diabetes.  DIABETIC HISTORY:  [name] was diagnosed with DM in 2006. He has been on oral glycemic agents since his diagnosis, Trulicity introduced in 2018.  He is intolerant to SGLT-2 inhibitors due to genital  infections. He is intolerant to bromocriptine due to vomiting. His hemoglobin A1c has ranged from 6.2% in 2018, peaking at 8.0% in 03/2018.   SUBJECTIVE:    Today (07/23/2018): [name] is here for a short term follow up following the development of vomiting due to bromocriptine.He started this on Thursday followed by nausea and vomiting, he took it again the following day with similar symptoms. Pt stopped bromocriptine with resolution of symptoms for the past 2 days.  He checks his blood sugars 2 times daily, preprandial to breakfast and during the day . The patient has not had hypoglycemic episodes since the last clinic visit. Otherwise, the patient has not required any recent emergency interventions for hypoglycemia and has not had recent hospitalizations secondary to hyper or hypoglycemic episodes.    ROS: As per HPI and as detailed below: Review of Systems  Constitutional: Negative for chills and fever.  Respiratory: Negative for cough and shortness of breath.   Cardiovascular: Negative for chest pain and palpitations.  Gastrointestinal: Positive for nausea and vomiting. Negative for constipation.      HOME DIABETES  REGIMEN:  Metformin 267 mg BID  Trulicity 1.5 mg weekly     Glucose Log :  07/20/2018    129        142  (2-hr post prandial) 6/13             121         174 6/14             131 6/15              148         138  HISTORY:  Past Medical History:  Past Medical History:  Diagnosis Date  . Acute medial meniscal tear, left, initial encounter   . Anxiety   . Diabetes mellitus   . GERD (gastroesophageal reflux disease)   . Hyperlipidemia   . Hypertension   . Irritable bowel   . Spastic colon    anaphylaxis due to [place]   Past Surgical History:  Past Surgical History:  Procedure Laterality Date  . CHOLECYSTECTOMY    . CHONDROPLASTY Left 01/15/2016   Procedure: CHONDROPLASTY;  Surgeon: Melrose Nakayama, MD;  Location: [place];  Service: Orthopedics;  Laterality: Left;  . KNEE ARTHROSCOPY WITH MEDIAL MENISECTOMY Left 01/15/2016   Procedure: ARTHROSCOPY LEFT KNEE WITH PARTIAL MEDIAL MENISECTOMY;  Surgeon: Melrose Nakayama, MD;  Location: [place];  Service: Orthopedics;  Laterality: Left;  . NASAL SINUS SURGERY      Social History:  reports that he has quit smoking. He has never used smokeless tobacco. He reports current alcohol use. He reports that he does not use drugs. Family History:  Family History  Problem Relation Age of  Onset  . Stroke Mother   . Pulmonary embolism Father   . Diabetes Brother      HOME MEDICATIONS: Allergies as of 07/23/2018      Reactions   Cephalexin Anaphylaxis   REACTION: pt is not allergic to penicillin   Bactrim [sulfamethoxazole-trimethoprim] Other (See Comments)   ? Took Bactrim and Flexeril at same time had lip swelling unsure which caused reaction.   Flexeril [cyclobenzaprine] Swelling      Medication List       Accurate as of July 23, 2018  4:14 PM. If you have any questions, ask your nurse or doctor.        STOP taking these medications   bromocriptine 2.5 MG tablet Commonly known as:  PARLODEL Stopped by: Dorita Sciara, MD   citalopram 10 MG tablet Commonly known as: CELEXA Stopped by: Dorita Sciara, MD     TAKE these medications   albuterol 108 (90 Base) MCG/ACT inhaler Commonly known as: ProAir HFA Inhale 2 puffs every 6 (six) hours as needed into the lungs for wheezing.   ALPRAZolam 0.25 MG tablet Commonly known as: XANAX take 1 tablet by mouth three times a day if needed   amLODipine 10 MG tablet Commonly known as: NORVASC TAKE 1 TABLET BY MOUTH EVERY DAY   atorvastatin 10 MG tablet Commonly known as: LIPITOR Take 1 tablet (10 mg total) by mouth daily.   candesartan 16 MG tablet Commonly known as: ATACAND Take 1 tablet (16 mg total) by mouth at bedtime.   co-enzyme Q-10 30 MG capsule Take 100 mg by mouth daily.   dicyclomine 20 MG tablet Commonly known as: BENTYL TAKE 1 TABLET BY MOUTH EVERY 6 HOURS   Dulaglutide 1.5 MG/0.5ML Sopn Commonly known as: Trulicity Inject 1.5 mg into the skin once a week.   esomeprazole 40 MG capsule Commonly known as: NEXIUM Take 20 mg by mouth daily.   Fluad 0.5 ML Susy Generic drug: Influenza Vac A&B Surf Ant Adj ADM 0.5ML IM UTD   fluconazole 100 MG tablet Commonly known as: DIFLUCAN Take 1 tablet (100 mg total) by mouth daily.   fluticasone 50 MCG/ACT nasal spray Commonly known as: FLONASE Place into both nostrils daily.   Insulin Pen Needle 32G X 6 MM Misc Use as directed with insulin pen   Linzess 290 MCG Caps capsule Generic drug: linaclotide TAKE 1 CAPSULE BY MOUTH EVERY DAY BEFORE BREAKFAST   meloxicam 7.5 MG tablet Commonly known as: MOBIC Take 1 tablet (7.5 mg total) by mouth daily.   metFORMIN 750 MG 24 hr tablet Commonly known as: GLUCOPHAGE-XR TAKE 1 TABLET BY MOUTH TWICE DAILY WITH MEALS   moxifloxacin 0.5 % ophthalmic solution Commonly known as: Vigamox Place 1 drop into both eyes 3 (three) times daily.   OneTouch Verio test strip Generic drug: glucose blood  TEST ONCE DAILY   rOPINIRole 0.25 MG tablet Commonly known as: Requip 1 po qhs x 3 days then increase to 2 po qhs   Slo-Niacin 500 MG tablet Generic drug: niacin Take 500 mg by mouth at bedtime.   tamsulosin 0.4 MG Caps capsule Commonly known as: FLOMAX Take 1 capsule (0.4 mg total) by mouth daily.   Vascepa 1 g Caps Generic drug: Icosapent Ethyl TAKE 2 CAPSULES BY MOUTH TWICE DAILY   Vitamin D 50 MCG (2000 UT) tablet Take 2,000 Units by mouth daily.        OBJECTIVE:   Vital Signs: BP (!) 152/84 (BP Location: Left Arm,  Patient Position: Sitting, Cuff Size: Normal)   Pulse 84   Temp (!) 97.4 F (36.3 C)   Ht 5\' 10"  (1.778 m)   Wt 196 lb 3.2 oz (89 kg)   SpO2 98%   BMI 28.15 kg/m   Wt Readings from Last 3 Encounters:  07/23/18 196 lb 3.2 oz (89 kg)  03/30/18 205 lb (93 kg)  01/02/18 202 lb (91.6 kg)     Exam: General: Pt appears well and is in NAD  Lungs: Clear with good BS bilat with no rales, rhonchi, or wheezes  Heart: RRR with normal S1 and S2 and no gallops; no murmurs; no rub  Abdomen: Normoactive bowel sounds, soft, nontender.  Extremities: No pretibial edema.       DATA REVIEWED: Results for ALICIA, ACKERT" (MRN [id number]) as of 07/23/2018 16:17  Ref. Range 11/02/2017 10:17 03/30/2018 10:31 07/18/2018 10:31  Hemoglobin A1C Latest Ref Range: 4.6 - 6.5 % 6.5 (A) 8.0 (H) 7.6 (H)     ASSESSMENT / PLAN / RECOMMENDATIONS:   1) Type 2 Diabetes Mellitus, Sub-Optimally controlled, With neuropathic complications - Most recent A1c of 7.6 %. Goal A1c < 7.0 %.    - He is intolerant to Invokana due to balanitis and recent development of vomiting that he attributes to bromocriptine. - His increase in A1c in 03/2018 is attributed to decrease physician activity and dietary indiscretions around the holidays, he is trying to do better with lifestyle changes.  - In review of his most recent glucose readings, no add on therapy will be offered at this time,  as the risk will out weight the benefit. - He was encouraged to continue with glucose checks, and to contact us should his Fasting BG remain > 140 mg/dL or during the day BG's remain > 180 mg/dL.   Plan: MEDICATIONS:  Metformin 272 mg XR BID  Trulicity 1.5 mg weekly   EDUCATION / INSTRUCTIONS:  BG monitoring instructions: Patient is instructed to check his blood sugars 2 times a day.  Call [place] [facility] clinic if: BG persistently < 70 or > 300. . I reviewed the Rule of 15 for the treatment of hypoglycemia in detail with the patient. Literature supplied.   2. Nausea and Vomiting :  - Resolved - Encouraged hydration    Signed electronically by: Mack Guise, MD  [facility]  [facility] [address] [place], [place] [address] Phone: [phone] FAX: [phone]   CC: Ann Held, DO [address] [place] [address] Phone: [phone]  Fax: [phone]  Return to Endocrinology clinic as below: Future Appointments  Date Time Provider [place]  10/01/2018 11:15 AM Renato Shin, MD [facility] None

## 2018-07-23 NOTE — Telephone Encounter (Signed)
Pt scheduled for today at 3:40pm with Dr. Kelton Pillar in the absence of Dr. Loanne Drilling.

## 2018-07-23 NOTE — Telephone Encounter (Signed)
This message was routed to Dr. Loanne Drilling to make him aware. Please ensure either Dr.'s Shamleffer, Cruzita Lederer or Dwyane Dee review this as well.

## 2018-07-23 NOTE — Telephone Encounter (Signed)
Message has been routed to Dr. Loanne Drilling to make him aware of pt concerns. Please discuss with Dr.'s Emeline Darling and Shamleffer to determine who is available to see pt for further evaluation. Thank you

## 2018-07-24 ENCOUNTER — Other Ambulatory Visit: Payer: Self-pay

## 2018-07-24 NOTE — Telephone Encounter (Signed)
Please advise Dr Kelton Pillar if any recommendations - Dr Loanne Drilling is not available.

## 2018-07-24 NOTE — Telephone Encounter (Signed)
Message has been sent to patient with recommendations.

## 2018-07-25 DIAGNOSIS — M545 Low back pain: Secondary | ICD-10-CM | POA: Diagnosis not present

## 2018-07-30 ENCOUNTER — Other Ambulatory Visit: Payer: Self-pay | Admitting: Endocrinology

## 2018-08-27 ENCOUNTER — Other Ambulatory Visit: Payer: Self-pay | Admitting: Family Medicine

## 2018-08-27 MED ORDER — AMLODIPINE BESYLATE 10 MG PO TABS: 10.0000 mg | ORAL_TABLET | Freq: Every day | ORAL | 0 refills | Status: DC

## 2018-10-01 ENCOUNTER — Other Ambulatory Visit: Payer: Self-pay

## 2018-10-01 ENCOUNTER — Other Ambulatory Visit: Payer: Self-pay | Admitting: *Deleted

## 2018-10-01 ENCOUNTER — Ambulatory Visit: Payer: Medicare Other | Admitting: Endocrinology

## 2018-10-01 ENCOUNTER — Encounter: Payer: Self-pay | Admitting: Endocrinology

## 2018-10-01 VITALS — BP 134/82 | HR 67 | Ht 70.0 in | Wt 195.8 lb

## 2018-10-01 DIAGNOSIS — E119 Type 2 diabetes mellitus without complications: Secondary | ICD-10-CM

## 2018-10-01 DIAGNOSIS — I8393 Asymptomatic varicose veins of bilateral lower extremities: Secondary | ICD-10-CM

## 2018-10-01 DIAGNOSIS — Z23 Encounter for immunization: Secondary | ICD-10-CM

## 2018-10-01 DIAGNOSIS — E781 Pure hyperglyceridemia: Secondary | ICD-10-CM

## 2018-10-01 LAB — POCT GLYCOSYLATED HEMOGLOBIN (HGB A1C): Hemoglobin A1C: 7 % — AB (ref 4.0–5.6)

## 2018-10-01 MED ORDER — VASCEPA 1 G PO CAPS: 2.0000 | ORAL_CAPSULE | Freq: Two times a day (BID) | ORAL | 2 refills | Status: DC

## 2018-10-01 NOTE — Progress Notes (Signed)
Subjective:    Patient ID: Bruce Romero, male    DOB: 1946-01-03, 73 y.o.   MRN: [id number]  HPI Pt returns for f/u of diabetes mellitus:  DM type: 2 Dx'ed: 123456 Complications: polyneuropathy.  Therapy: trulicity and metformin DKA: never.  Severe hypoglycemia: never.   Pancreatitis: never.  Other: he has never been on insulin; he did not tolerate invokana (yeast infection).   Interval history:  pt states he feels well in general.  He says cbg's are well-controlled.  He takes meds as rx'ed.     Past Medical History:  Diagnosis Date  . Acute medial meniscal tear, left, initial encounter   . Anxiety   . Diabetes mellitus   . GERD (gastroesophageal reflux disease)   . Hyperlipidemia   . Hypertension   . Irritable bowel   . Spastic colon    anaphylaxis due to [place]    Past Surgical History:  Procedure Laterality Date  . CHOLECYSTECTOMY    . CHONDROPLASTY Left 01/15/2016   Procedure: CHONDROPLASTY;  Surgeon: Melrose Nakayama, MD;  Location: [place];  Service: Orthopedics;  Laterality: Left;  . KNEE ARTHROSCOPY WITH MEDIAL MENISECTOMY Left 01/15/2016   Procedure: ARTHROSCOPY LEFT KNEE WITH PARTIAL MEDIAL MENISECTOMY;  Surgeon: Melrose Nakayama, MD;  Location: [place];  Service: Orthopedics;  Laterality: Left;  . NASAL SINUS SURGERY      Social History   Socioeconomic History  . Marital status: Married    Spouse name: Not on file  . Number of children: Not on file  . Years of education: Not on file  . Highest education level: Not on file  Occupational History  . Occupation: retired  Scientific laboratory technician  . Financial resource strain: Not on file  . Food insecurity    Worry: Not on file    Inability: Not on file  . Transportation needs    Medical: Not on file    Non-medical: Not on file  Tobacco Use  . Smoking status: Former Research scientist (life sciences)  . Smokeless tobacco: Never Used  Substance and Sexual Activity  . Alcohol use: Yes    Alcohol/week:  0.0 standard drinks    Comment: social  . Drug use: No  . Sexual activity: Yes  Lifestyle  . Physical activity    Days per week: Not on file    Minutes per session: Not on file  . Stress: Not on file  Relationships  . Social Herbalist on phone: Not on file    Gets together: Not on file    Attends religious service: Not on file    Active member of club or organization: Not on file    Attends meetings of clubs or organizations: Not on file    Relationship status: Not on file  . Intimate partner violence    Fear of current or ex partner: Not on file    Emotionally abused: Not on file    Physically abused: Not on file    Forced sexual activity: Not on file  Other Topics Concern  . Not on file  Social History Narrative   Exercise try to everyday.    Current Outpatient Medications on File Prior to Visit  Medication Sig Dispense Refill  . albuterol (PROAIR HFA) 108 (90 Base) MCG/ACT inhaler Inhale 2 puffs every 6 (six) hours as needed into the lungs for wheezing. 1 Inhaler 0  . ALPRAZolam (XANAX) 0.25 MG tablet take 1 tablet by mouth three times a day  if needed 30 tablet 0  . amLODipine (NORVASC) 10 MG tablet Take 1 tablet (10 mg total) by mouth daily. 90 tablet 0  . atorvastatin (LIPITOR) 10 MG tablet Take 1 tablet (10 mg total) by mouth daily. 90 tablet 1  . candesartan (ATACAND) 16 MG tablet Take 1 tablet (16 mg total) by mouth at bedtime. 90 tablet 0  . Cholecalciferol (VITAMIN D) 2000 UNITS tablet Take 2,000 Units by mouth daily.      Marland Kitchen co-enzyme Q-10 30 MG capsule Take 100 mg by mouth daily.    Marland Kitchen dicyclomine (BENTYL) 20 MG tablet TAKE 1 TABLET BY MOUTH EVERY 6 HOURS 60 tablet 5  . Dulaglutide (TRULICITY) 1.5 0000000 SOPN Inject 1.5 mg into the skin once a week. 4 pen 11  . esomeprazole (NEXIUM) 40 MG capsule Take 20 mg by mouth daily.    . fluticasone (FLONASE) 50 MCG/ACT nasal spray Place into both nostrils daily.    . Insulin Pen Needle 32G X 6 MM MISC Use as  directed with insulin pen 30 each 3  . LINZESS 290 MCG CAPS capsule TAKE 1 CAPSULE BY MOUTH EVERY DAY BEFORE BREAKFAST 90 capsule 0  . meloxicam (MOBIC) 7.5 MG tablet Take 1 tablet (7.5 mg total) by mouth daily. 14 tablet 0  . metFORMIN (GLUCOPHAGE-XR) 750 MG 24 hr tablet TAKE 1 TABLET BY MOUTH TWICE DAILY WITH MEALS 180 tablet 0  . niacin (SLO-NIACIN) 500 MG tablet Take 500 mg by mouth at bedtime.    Glory Rosebush VERIO test strip TEST ONCE DAILY 100 each 2  . rOPINIRole (REQUIP) 0.25 MG tablet 1 po qhs x 3 days then increase to 2 po qhs 60 tablet 0  . tamsulosin (FLOMAX) 0.4 MG CAPS capsule Take 1 capsule (0.4 mg total) by mouth daily. 30 capsule 3   No current facility-administered medications on file prior to visit.     Allergies  Allergen Reactions  . Cephalexin Anaphylaxis    REACTION: pt is not allergic to penicillin  . Bactrim [Sulfamethoxazole-Trimethoprim] Other (See Comments)    ? Took Bactrim and Flexeril at same time had lip swelling unsure which caused reaction.  . Bromocriptine Nausea And Vomiting  . Flexeril [Cyclobenzaprine] Swelling  . Invokana [Canagliflozin]     balanitis    Family History  Problem Relation Age of Onset  . Stroke Mother   . Pulmonary embolism Father   . Diabetes Brother     BP 134/82 (BP Location: Left Arm, Patient Position: Sitting, Cuff Size: Normal)   Pulse 67   Ht 5\' 10"  (1.778 m)   Wt 195 lb 12.8 oz (88.8 kg)   SpO2 96%   BMI 28.09 kg/m    Review of Systems Denies nausea.      Objective:   Physical Exam VITAL SIGNS:  See vs page GENERAL: no distress Pulses: dorsalis pedis intact bilat.   MSK: no deformity of the feet CV: no leg edema, but there are bilat vv's.   Skin:  no ulcer on the feet.  normal color and temp on the feet. Neuro: sensation is intact to touch on the feet  Lab Results  Component Value Date   HGBA1C 7.0 (A) 10/01/2018   Lab Results  Component Value Date   CREATININE 0.89 03/30/2018   BUN 11 03/30/2018    NA 139 03/30/2018   K 3.6 03/30/2018   CL 99 03/30/2018   CO2 26 03/30/2018       Assessment & Plan:  Type 2  DM: well-controlled.   VV's: he is at risk for leg edema, so This limits rx options  Patient Instructions  Please continue the same medications. Please come back for a follow-up appointment in 4-6 months  check your blood sugar once a day.  vary the time of day when you check, between before the 3 meals, and at bedtime.  also check if you have symptoms of your blood sugar being too high or too low.  please keep a record of the readings and bring it to your next appointment here (or you can bring the meter itself).  You can write it on any piece of paper.  please call us sooner if your blood sugar goes below 70, or if you have a lot of readings over 200.

## 2018-10-01 NOTE — Patient Instructions (Addendum)
Please continue the same medications. Please come back for a follow-up appointment in 4-6 months  check your blood sugar once a day.  vary the time of day when you check, between before the 3 meals, and at bedtime.  also check if you have symptoms of your blood sugar being too high or too low.  please keep a record of the readings and bring it to your next appointment here (or you can bring the meter itself).  You can write it on any piece of paper.  please call us sooner if your blood sugar goes below 70, or if you have a lot of readings over 200.

## 2018-10-03 DIAGNOSIS — D1801 Hemangioma of skin and subcutaneous tissue: Secondary | ICD-10-CM | POA: Diagnosis not present

## 2018-10-03 DIAGNOSIS — L578 Other skin changes due to chronic exposure to nonionizing radiation: Secondary | ICD-10-CM | POA: Diagnosis not present

## 2018-10-03 DIAGNOSIS — D225 Melanocytic nevi of trunk: Secondary | ICD-10-CM | POA: Diagnosis not present

## 2018-10-03 DIAGNOSIS — L570 Actinic keratosis: Secondary | ICD-10-CM | POA: Diagnosis not present

## 2018-10-03 DIAGNOSIS — L821 Other seborrheic keratosis: Secondary | ICD-10-CM | POA: Diagnosis not present

## 2018-10-19 ENCOUNTER — Other Ambulatory Visit: Payer: Self-pay

## 2018-10-19 NOTE — Patient Outreach (Signed)
[facility]) Care Management  10/19/2018  MEAD CHEA Sep 10, 1945 [id number]   Medication Adherence call to Mr. Efthimios Thane HIPPA Compliant Voice message left with a call back number. Mr. Schleeter is showing past due on Atorvastatin 10 mg under [place].   [facility] Management Direct Dial [phone]  Fax [phone] [email or web address]

## 2018-10-26 ENCOUNTER — Other Ambulatory Visit: Payer: Self-pay | Admitting: *Deleted

## 2018-10-26 MED ORDER — CANDESARTAN CILEXETIL 16 MG PO TABS: 16.0000 mg | ORAL_TABLET | Freq: Every day | ORAL | 0 refills | Status: DC

## 2018-10-26 MED ORDER — AMLODIPINE BESYLATE 10 MG PO TABS: 10.0000 mg | ORAL_TABLET | Freq: Every day | ORAL | 0 refills | Status: DC

## 2018-11-05 ENCOUNTER — Other Ambulatory Visit: Payer: Self-pay

## 2018-11-05 DIAGNOSIS — E119 Type 2 diabetes mellitus without complications: Secondary | ICD-10-CM

## 2018-11-05 MED ORDER — METFORMIN HCL ER 750 MG PO TB24: 750.0000 mg | ORAL_TABLET | Freq: Two times a day (BID) | ORAL | 0 refills | Status: DC

## 2018-11-15 ENCOUNTER — Other Ambulatory Visit: Payer: Self-pay

## 2018-11-15 NOTE — Patient Outreach (Signed)
[facility]) Care Management  11/15/2018  [name] 1945-08-16 [id number]   Medication Adherence call to [name] HIPPA Compliant Voice message left with a call back number. Bruce Romero is showing past due on Atorvastatin 10 mg under [place].   [place] Management Direct Dial [phone]  Fax [phone] [email or web address]

## 2018-11-30 ENCOUNTER — Other Ambulatory Visit: Payer: Self-pay

## 2018-11-30 ENCOUNTER — Ambulatory Visit (INDEPENDENT_AMBULATORY_CARE_PROVIDER_SITE_OTHER): Payer: Medicare Other | Admitting: Family Medicine

## 2018-11-30 ENCOUNTER — Encounter: Payer: Self-pay | Admitting: Family Medicine

## 2018-11-30 VITALS — Temp 97.1°F | Ht 70.0 in

## 2018-11-30 DIAGNOSIS — Z20822 Contact with and (suspected) exposure to covid-19: Secondary | ICD-10-CM

## 2018-11-30 DIAGNOSIS — F411 Generalized anxiety disorder: Secondary | ICD-10-CM

## 2018-11-30 DIAGNOSIS — J0140 Acute pansinusitis, unspecified: Secondary | ICD-10-CM

## 2018-11-30 MED ORDER — ALPRAZOLAM 0.25 MG PO TABS: ORAL_TABLET | ORAL | 0 refills | Status: DC

## 2018-11-30 MED ORDER — AMOXICILLIN-POT CLAVULANATE 875-125 MG PO TABS: 1.0000 | ORAL_TABLET | Freq: Two times a day (BID) | ORAL | 0 refills | Status: DC

## 2018-11-30 NOTE — Progress Notes (Signed)
Virtual Visit via Video Note  I connected with Bruce Romero on 11/30/18 at  9:40 AM EDT by a video enabled telemedicine application and verified that I am speaking with the correct person using two identifiers.  Location: Patient: home  Provider: office    I discussed the limitations of evaluation and management by telemedicine and the availability of in person appointments. The patient expressed understanding and agreed to proceed.  History of Present Illness: Pt is home c/o sinus pressure and nasal congestion x 1 week   No fever   watery eyes  +pnd     Pt is taking flonase , navage and advil as well as an air purifier and vaporizor  Observations/Objective: Today's Vitals   11/30/18 0945  Temp: (!) 97.1 F (36.2 C)  Height: 5\' 10"  (1.778 m)  po  97% ra Body mass index is 28.09 kg/m.   Assessment and Plan: 1. Generalized anxiety disorder Stable con't meds prn  - ALPRAZolam (XANAX) 0.25 MG tablet; take 1 tablet by mouth three times a day if needed  Dispense: 30 tablet; Refill: 0  2. Acute pansinusitis, recurrence not specified con't flonase and navage abx per orders and covid test Call prn  - amoxicillin-clavulanate (AUGMENTIN) 875-125 MG tablet; Take 1 tablet by mouth 2 (two) times daily.  Dispense: 20 tablet; Refill: 0 - Novel Coronavirus, NAA ([facility])   Follow Up Instructions:    I discussed the assessment and treatment plan with the patient. The patient was provided an opportunity to ask questions and all were answered. The patient agreed with the plan and demonstrated an understanding of the instructions.   The patient was advised to call back or seek an in-person evaluation if the symptoms worsen or if the condition fails to improve as anticipated.  I provided 15 minutes of non-face-to-face time during this encounter.   Ann Held, DO

## 2018-12-01 LAB — NOVEL CORONAVIRUS, NAA: SARS-CoV-2, NAA: NOT DETECTED

## 2019-01-11 ENCOUNTER — Other Ambulatory Visit: Payer: Self-pay | Admitting: Endocrinology

## 2019-01-11 DIAGNOSIS — E119 Type 2 diabetes mellitus without complications: Secondary | ICD-10-CM

## 2019-01-15 ENCOUNTER — Telehealth: Payer: Self-pay | Admitting: *Deleted

## 2019-01-15 DIAGNOSIS — E781 Pure hyperglyceridemia: Secondary | ICD-10-CM

## 2019-01-15 MED ORDER — VASCEPA 1 G PO CAPS: 2.0000 g | ORAL_CAPSULE | Freq: Two times a day (BID) | ORAL | 2 refills | Status: DC

## 2019-01-15 MED ORDER — CANDESARTAN CILEXETIL 16 MG PO TABS: 16.0000 mg | ORAL_TABLET | Freq: Every day | ORAL | 0 refills | Status: DC

## 2019-01-15 NOTE — Addendum Note (Signed)
Addended by: Kelle Darting A on: 01/15/2019 12:01 PM   Modules accepted: Orders

## 2019-01-15 NOTE — Telephone Encounter (Addendum)
Received fax from [facility] requesting vascepa and candesartan refill. Refill sent.

## 2019-02-18 ENCOUNTER — Other Ambulatory Visit: Payer: Self-pay | Admitting: Family Medicine

## 2019-02-18 MED ORDER — AMLODIPINE BESYLATE 10 MG PO TABS: 10.0000 mg | ORAL_TABLET | Freq: Every day | ORAL | 0 refills | Status: DC

## 2019-02-20 ENCOUNTER — Other Ambulatory Visit: Payer: Self-pay | Admitting: Family Medicine

## 2019-02-20 DIAGNOSIS — F411 Generalized anxiety disorder: Secondary | ICD-10-CM

## 2019-02-20 DIAGNOSIS — K589 Irritable bowel syndrome without diarrhea: Secondary | ICD-10-CM

## 2019-02-20 NOTE — Telephone Encounter (Signed)
Requesting: Xanax Contract: 06/30/2016 UDS: 06/30/2016 Last OV: 11/30/2018 Next OV: N/A Last Refill: 12/07/2018, #30--0 RF Database:   Please advise

## 2019-02-21 ENCOUNTER — Encounter: Payer: Self-pay | Admitting: Family Medicine

## 2019-02-21 MED ORDER — ALPRAZOLAM 0.25 MG PO TABS: ORAL_TABLET | ORAL | 0 refills | Status: DC

## 2019-02-22 MED ORDER — LINACLOTIDE 290 MCG PO CAPS: 290.0000 ug | ORAL_CAPSULE | Freq: Every day | ORAL | 0 refills | Status: DC

## 2019-03-20 LAB — HM DIABETES EYE EXAM

## 2019-03-27 ENCOUNTER — Other Ambulatory Visit: Payer: Self-pay

## 2019-03-27 ENCOUNTER — Other Ambulatory Visit (INDEPENDENT_AMBULATORY_CARE_PROVIDER_SITE_OTHER): Payer: Medicare Other

## 2019-03-27 ENCOUNTER — Other Ambulatory Visit: Payer: Self-pay | Admitting: Family Medicine

## 2019-03-27 DIAGNOSIS — E781 Pure hyperglyceridemia: Secondary | ICD-10-CM | POA: Diagnosis not present

## 2019-03-27 DIAGNOSIS — E1165 Type 2 diabetes mellitus with hyperglycemia: Secondary | ICD-10-CM

## 2019-03-27 LAB — LIPID PANEL
Cholesterol: 103 mg/dL (ref 0–200)
HDL: 38.5 mg/dL — ABNORMAL LOW (ref >39.00)
LDL Cholesterol: 35 mg/dL (ref 0–99)
NonHDL: 64.69
Total CHOL/HDL Ratio: 3
Triglycerides: 149 mg/dL (ref 0.0–149.0)
VLDL: 29.8 mg/dL (ref 0.0–40.0)

## 2019-03-27 LAB — COMPREHENSIVE METABOLIC PANEL
ALT: 27 U/L (ref 0–53)
AST: 35 U/L (ref 0–37)
Albumin: 4.4 g/dL (ref 3.5–5.2)
Alkaline Phosphatase: 52 U/L (ref 39–117)
BUN: 23 mg/dL (ref 6–23)
CO2: 27 mEq/L (ref 19–32)
Calcium: 9.1 mg/dL (ref 8.4–10.5)
Chloride: 100 mEq/L (ref 96–112)
Creatinine, Ser: 1.23 mg/dL (ref 0.40–1.50)
GFR: 57.42 mL/min — ABNORMAL LOW (ref >60.00)
Glucose, Bld: 191 mg/dL — ABNORMAL HIGH (ref 70–99)
Potassium: 4.2 mEq/L (ref 3.5–5.1)
Sodium: 137 mEq/L (ref 135–145)
Total Bilirubin: 0.8 mg/dL (ref 0.2–1.2)
Total Protein: 6.8 g/dL (ref 6.0–8.3)

## 2019-03-27 LAB — HEMOGLOBIN A1C: Hgb A1c MFr Bld: 8.4 % — ABNORMAL HIGH (ref 4.6–6.5)

## 2019-03-27 NOTE — Addendum Note (Signed)
Addended by: Kelle Darting A on: 03/27/2019 11:16 AM   Modules accepted: Orders

## 2019-04-01 ENCOUNTER — Other Ambulatory Visit: Payer: Self-pay

## 2019-04-03 ENCOUNTER — Ambulatory Visit: Payer: Medicare Other | Admitting: Endocrinology

## 2019-04-03 ENCOUNTER — Other Ambulatory Visit: Payer: Self-pay

## 2019-04-03 ENCOUNTER — Encounter: Payer: Self-pay | Admitting: Endocrinology

## 2019-04-03 VITALS — BP 138/80 | HR 90 | Ht 70.0 in | Wt 200.4 lb

## 2019-04-03 DIAGNOSIS — E119 Type 2 diabetes mellitus without complications: Secondary | ICD-10-CM

## 2019-04-03 MED ORDER — TRULICITY 3 MG/0.5ML ~~LOC~~ SOAJ: 3.0000 mg | SUBCUTANEOUS | 11 refills | Status: DC

## 2019-04-03 NOTE — Progress Notes (Signed)
Subjective:    Patient ID: Bruce Romero, male    DOB: 28-Apr-1945, 74 y.o.   MRN: [id number]  HPI Pt returns for f/u of diabetes mellitus:  DM type: 2 Dx'ed: 123456 Complications: polyneuropathy.  Therapy: trulicity and metformin.  DKA: never.  Severe hypoglycemia: never.   Pancreatitis: never.  Other: he has never been on insulin; he did not tolerate invokana (yeast infection).   Interval history:  pt states he feels well in general.  He brings a record of his cbg's which I have reviewed today.  cbg varies from 113-185.  He takes meds as rx'ed.   Past Medical History:  Diagnosis Date  . Acute medial meniscal tear, left, initial encounter   . Anxiety   . Diabetes mellitus   . GERD (gastroesophageal reflux disease)   . Hyperlipidemia   . Hypertension   . Irritable bowel   . Spastic colon    anaphylaxis due to [place]    Past Surgical History:  Procedure Laterality Date  . CHOLECYSTECTOMY    . CHONDROPLASTY Left 01/15/2016   Procedure: CHONDROPLASTY;  Surgeon: Melrose Nakayama, MD;  Location: [facility];  Service: Orthopedics;  Laterality: Left;  . KNEE ARTHROSCOPY WITH MEDIAL MENISECTOMY Left 01/15/2016   Procedure: ARTHROSCOPY LEFT KNEE WITH PARTIAL MEDIAL MENISECTOMY;  Surgeon: Melrose Nakayama, MD;  Location: [facility];  Service: Orthopedics;  Laterality: Left;  . NASAL SINUS SURGERY      Social History   Socioeconomic History  . Marital status: Married    Spouse name: Not on file  . Number of children: Not on file  . Years of education: Not on file  . Highest education level: Not on file  Occupational History  . Occupation: retired  Tobacco Use  . Smoking status: Former Research scientist (life sciences)  . Smokeless tobacco: Never Used  Substance and Sexual Activity  . Alcohol use: Yes    Alcohol/week: 0.0 standard drinks    Comment: social  . Drug use: No  . Sexual activity: Yes  Other Topics Concern  . Not on file  Social History Narrative   Exercise try to everyday.   Social Determinants of Health   Financial Resource Strain:   . Difficulty of Paying Living Expenses: Not on file  Food Insecurity:   . Worried About Charity fundraiser in the Last Year: Not on file  . Ran Out of Food in the Last Year: Not on file  Transportation Needs:   . Lack of Transportation (Medical): Not on file  . Lack of Transportation (Non-Medical): Not on file  Physical Activity:   . Days of Exercise per Week: Not on file  . Minutes of Exercise per Session: Not on file  Stress:   . Feeling of Stress : Not on file  Social Connections:   . Frequency of Communication with Friends and Family: Not on file  . Frequency of Social Gatherings with Friends and Family: Not on file  . Attends Religious Services: Not on file  . Active Member of Clubs or Organizations: Not on file  . Attends Archivist Meetings: Not on file  . Marital Status: Not on file  Intimate Partner Violence:   . Fear of Current or Ex-Partner: Not on file  . Emotionally Abused: Not on file  . Physically Abused: Not on file  . Sexually Abused: Not on file    Current Outpatient Medications on File Prior to Visit  Medication Sig Dispense Refill  . albuterol (  PROAIR HFA) 108 (90 Base) MCG/ACT inhaler Inhale 2 puffs every 6 (six) hours as needed into the lungs for wheezing. 1 Inhaler 0  . ALPRAZolam (XANAX) 0.25 MG tablet take 1 tablet by mouth three times a day if needed 30 tablet 0  . amLODipine (NORVASC) 10 MG tablet Take 1 tablet (10 mg total) by mouth daily. 90 tablet 0  . atorvastatin (LIPITOR) 10 MG tablet Take 1 tablet (10 mg total) by mouth daily. 90 tablet 1  . candesartan (ATACAND) 16 MG tablet Take 1 tablet (16 mg total) by mouth at bedtime. 90 tablet 0  . Cholecalciferol (VITAMIN D) 2000 UNITS tablet Take 2,000 Units by mouth daily.      Marland Kitchen co-enzyme Q-10 30 MG capsule Take 100 mg by mouth daily.    Marland Kitchen dicyclomine (BENTYL) 20 MG tablet TAKE 1 TABLET BY MOUTH EVERY  6 HOURS 60 tablet 5  . esomeprazole (NEXIUM) 40 MG capsule Take 20 mg by mouth daily.    . fluticasone (FLONASE) 50 MCG/ACT nasal spray Place into both nostrils daily.    Marland Kitchen icosapent Ethyl (VASCEPA) 1 g capsule Take 2 capsules (2 g total) by mouth 2 (two) times daily. 120 capsule 2  . Insulin Pen Needle 32G X 6 MM MISC Use as directed with insulin pen 30 each 3  . linaclotide (LINZESS) 290 MCG CAPS capsule Take 1 capsule (290 mcg total) by mouth daily before breakfast. 90 capsule 0  . meloxicam (MOBIC) 7.5 MG tablet Take 1 tablet (7.5 mg total) by mouth daily. 14 tablet 0  . metFORMIN (GLUCOPHAGE-XR) 750 MG 24 hr tablet TAKE 1 TABLET(750 MG) BY MOUTH TWICE DAILY WITH A MEAL 180 tablet 0  . niacin (SLO-NIACIN) 500 MG tablet Take 500 mg by mouth at bedtime.    Glory Rosebush VERIO test strip TEST ONCE DAILY 100 each 2  . rOPINIRole (REQUIP) 0.25 MG tablet 1 po qhs x 3 days then increase to 2 po qhs 60 tablet 0  . tamsulosin (FLOMAX) 0.4 MG CAPS capsule Take 1 capsule (0.4 mg total) by mouth daily. 30 capsule 3   No current facility-administered medications on file prior to visit.    Allergies  Allergen Reactions  . Cephalexin Anaphylaxis    REACTION: pt is not allergic to penicillin  . Bactrim [Sulfamethoxazole-Trimethoprim] Other (See Comments)    ? Took Bactrim and Flexeril at same time had lip swelling unsure which caused reaction.  . Bromocriptine Nausea And Vomiting  . Flexeril [Cyclobenzaprine] Swelling  . Invokana [Canagliflozin]     balanitis    Family History  Problem Relation Age of Onset  . Stroke Mother   . Pulmonary embolism Father   . Diabetes Brother     BP 138/80 (BP Location: Left Arm, Patient Position: Sitting, Cuff Size: Normal)   Pulse 90   Ht 5\' 10"  (1.778 m)   Wt 200 lb 6.4 oz (90.9 kg)   SpO2 96%   BMI 28.75 kg/m    Review of Systems Denies nausea.     Objective:   Physical Exam VITAL SIGNS:  See vs page GENERAL: no distress Pulses: dorsalis pedis  intact bilat.   MSK: no deformity of the feet CV: no leg edema Skin:  no ulcer on the feet.  normal color and temp on the feet. Neuro: sensation is intact to touch on the feet   Lab Results  Component Value Date   HGBA1C 8.4 (H) 03/27/2019       Assessment & Plan:  Type 2 DM: worse  Patient Instructions  I have sent a prescription to your pharmacy, to increase the Trulicity, and: Please continue the same metformin Please come back for a follow-up appointment in 2 months.

## 2019-04-03 NOTE — Patient Instructions (Signed)
I have sent a prescription to your pharmacy, to increase the Trulicity, and: Please continue the same metformin Please come back for a follow-up appointment in 2 months.

## 2019-04-09 ENCOUNTER — Other Ambulatory Visit: Payer: Self-pay | Admitting: *Deleted

## 2019-04-09 MED ORDER — CANDESARTAN CILEXETIL 16 MG PO TABS: 16.0000 mg | ORAL_TABLET | Freq: Every day | ORAL | 0 refills | Status: DC

## 2019-04-30 ENCOUNTER — Telehealth: Payer: Self-pay

## 2019-04-30 DIAGNOSIS — E781 Pure hyperglyceridemia: Secondary | ICD-10-CM

## 2019-04-30 MED ORDER — ICOSAPENT ETHYL 1 G PO CAPS: 2.0000 g | ORAL_CAPSULE | Freq: Two times a day (BID) | ORAL | 2 refills | Status: DC

## 2019-04-30 NOTE — Telephone Encounter (Signed)
After Hours Call- Pt requesting refill on Vascepa.

## 2019-04-30 NOTE — Telephone Encounter (Signed)
Refill sent.

## 2019-05-23 ENCOUNTER — Encounter: Payer: Self-pay | Admitting: Family Medicine

## 2019-05-23 ENCOUNTER — Other Ambulatory Visit: Payer: Self-pay | Admitting: Endocrinology

## 2019-05-23 DIAGNOSIS — E119 Type 2 diabetes mellitus without complications: Secondary | ICD-10-CM

## 2019-05-23 DIAGNOSIS — D485 Neoplasm of uncertain behavior of skin: Secondary | ICD-10-CM | POA: Diagnosis not present

## 2019-05-23 DIAGNOSIS — C4441 Basal cell carcinoma of skin of scalp and neck: Secondary | ICD-10-CM | POA: Diagnosis not present

## 2019-05-23 DIAGNOSIS — B356 Tinea cruris: Secondary | ICD-10-CM | POA: Diagnosis not present

## 2019-05-23 DIAGNOSIS — L309 Dermatitis, unspecified: Secondary | ICD-10-CM | POA: Diagnosis not present

## 2019-05-23 DIAGNOSIS — L578 Other skin changes due to chronic exposure to nonionizing radiation: Secondary | ICD-10-CM | POA: Diagnosis not present

## 2019-05-23 DIAGNOSIS — C44629 Squamous cell carcinoma of skin of left upper limb, including shoulder: Secondary | ICD-10-CM | POA: Diagnosis not present

## 2019-06-03 ENCOUNTER — Other Ambulatory Visit: Payer: Self-pay

## 2019-06-04 ENCOUNTER — Other Ambulatory Visit: Payer: Self-pay | Admitting: *Deleted

## 2019-06-04 MED ORDER — AMLODIPINE BESYLATE 10 MG PO TABS: 10.0000 mg | ORAL_TABLET | Freq: Every day | ORAL | 0 refills | Status: DC

## 2019-06-05 ENCOUNTER — Encounter: Payer: Self-pay | Admitting: Endocrinology

## 2019-06-05 ENCOUNTER — Ambulatory Visit: Payer: Medicare Other | Admitting: Endocrinology

## 2019-06-05 ENCOUNTER — Other Ambulatory Visit: Payer: Self-pay

## 2019-06-05 VITALS — BP 110/70 | HR 80 | Ht 70.0 in | Wt 187.0 lb

## 2019-06-05 DIAGNOSIS — E119 Type 2 diabetes mellitus without complications: Secondary | ICD-10-CM

## 2019-06-05 LAB — POCT GLYCOSYLATED HEMOGLOBIN (HGB A1C): Hemoglobin A1C: 6.7 % — AB (ref 4.0–5.6)

## 2019-06-05 MED ORDER — TRULICITY 4.5 MG/0.5ML ~~LOC~~ SOAJ: 4.5000 mg | SUBCUTANEOUS | 11 refills | Status: DC

## 2019-06-05 MED ORDER — METFORMIN HCL ER 750 MG PO TB24: 750.0000 mg | ORAL_TABLET | Freq: Every day | ORAL | 3 refills | Status: DC

## 2019-06-05 NOTE — Progress Notes (Signed)
Subjective:    Patient ID: Bruce Romero, male    DOB: [birth year], 74 y.o.   MRN: [id number]  HPI Pt returns for f/u of diabetes mellitus:  DM type: 2 Dx'ed: 123456 Complications: PN and CRI Therapy: trulicity and metformin.  DKA: never.  Severe hypoglycemia: never.   Pancreatitis: never.  Other: he has never been on insulin; he did not tolerate invokana (yeast infection).   Interval history:  pt states he feels well in general.  He says cbg's are in the 100's.  He takes meds as rx'ed.  Past Medical History:  Diagnosis Date  . Acute medial meniscal tear, left, initial encounter   . Anxiety   . Diabetes mellitus   . GERD (gastroesophageal reflux disease)   . Hyperlipidemia   . Hypertension   . Irritable bowel   . Spastic colon    anaphylaxis due to [place]    Past Surgical History:  Procedure Laterality Date  . CHOLECYSTECTOMY    . CHONDROPLASTY Left 01/15/2016   Procedure: CHONDROPLASTY;  Surgeon: Melrose Nakayama, MD;  Location: [facility] [facility];  Service: Orthopedics;  Laterality: Left;  . KNEE ARTHROSCOPY WITH MEDIAL MENISECTOMY Left 01/15/2016   Procedure: ARTHROSCOPY LEFT KNEE WITH PARTIAL MEDIAL MENISECTOMY;  Surgeon: Melrose Nakayama, MD;  Location: [facility];  Service: Orthopedics;  Laterality: Left;  . NASAL SINUS SURGERY      Social History   Socioeconomic History  . Marital status: Married    Spouse name: Not on file  . Number of children: Not on file  . Years of education: Not on file  . Highest education level: Not on file  Occupational History  . Occupation: retired  Tobacco Use  . Smoking status: Former Research scientist (life sciences)  . Smokeless tobacco: Never Used  Substance and Sexual Activity  . Alcohol use: Yes    Alcohol/week: 0.0 standard drinks    Comment: social  . Drug use: No  . Sexual activity: Yes  Other Topics Concern  . Not on file  Social History Narrative   Exercise try to everyday.   Social Determinants of Health    Financial Resource Strain:   . Difficulty of Paying Living Expenses:   Food Insecurity:   . Worried About Charity fundraiser in the Last Year:   . Arboriculturist in the Last Year:   Transportation Needs:   . Film/video editor (Medical):   Marland Kitchen Lack of Transportation (Non-Medical):   Physical Activity:   . Days of Exercise per Week:   . Minutes of Exercise per Session:   Stress:   . Feeling of Stress :   Social Connections:   . Frequency of Communication with Friends and Family:   . Frequency of Social Gatherings with Friends and Family:   . Attends Religious Services:   . Active Member of Clubs or Organizations:   . Attends Archivist Meetings:   Marland Kitchen Marital Status:   Intimate Partner Violence:   . Fear of Current or Ex-Partner:   . Emotionally Abused:   Marland Kitchen Physically Abused:   . Sexually Abused:     Current Outpatient Medications on File Prior to Visit  Medication Sig Dispense Refill  . albuterol (PROAIR HFA) 108 (90 Base) MCG/ACT inhaler Inhale 2 puffs every 6 (six) hours as needed into the lungs for wheezing. 1 Inhaler 0  . ALPRAZolam (XANAX) 0.25 MG tablet take 1 tablet by mouth three times a day if needed 30 tablet 0  .  amLODipine (NORVASC) 10 MG tablet Take 1 tablet (10 mg total) by mouth daily. 90 tablet 0  . atorvastatin (LIPITOR) 10 MG tablet Take 1 tablet (10 mg total) by mouth daily. 90 tablet 1  . candesartan (ATACAND) 16 MG tablet Take 1 tablet (16 mg total) by mouth at bedtime. NEEDS OV/FOLLOW UP 90 tablet 0  . Cholecalciferol (VITAMIN D) 2000 UNITS tablet Take 2,000 Units by mouth daily.      Marland Kitchen co-enzyme Q-10 30 MG capsule Take 100 mg by mouth daily.    Marland Kitchen dicyclomine (BENTYL) 20 MG tablet TAKE 1 TABLET BY MOUTH EVERY 6 HOURS 60 tablet 5  . esomeprazole (NEXIUM) 40 MG capsule Take 20 mg by mouth daily.    . fluticasone (FLONASE) 50 MCG/ACT nasal spray Place into both nostrils daily.    Marland Kitchen icosapent Ethyl (VASCEPA) 1 g capsule Take 2 capsules (2 g  total) by mouth 2 (two) times daily. 120 capsule 2  . Insulin Pen Needle 32G X 6 MM MISC Use as directed with insulin pen 30 each 3  . linaclotide (LINZESS) 290 MCG CAPS capsule Take 1 capsule (290 mcg total) by mouth daily before breakfast. 90 capsule 0  . meloxicam (MOBIC) 7.5 MG tablet Take 1 tablet (7.5 mg total) by mouth daily. 14 tablet 0  . niacin (SLO-NIACIN) 500 MG tablet Take 500 mg by mouth at bedtime.    Glory Rosebush VERIO test strip TEST ONCE DAILY 100 each 2  . rOPINIRole (REQUIP) 0.25 MG tablet 1 po qhs x 3 days then increase to 2 po qhs 60 tablet 0  . tamsulosin (FLOMAX) 0.4 MG CAPS capsule Take 1 capsule (0.4 mg total) by mouth daily. 30 capsule 3   No current facility-administered medications on file prior to visit.    Allergies  Allergen Reactions  . Cephalexin Anaphylaxis    REACTION: pt is not allergic to penicillin  . Bactrim [Sulfamethoxazole-Trimethoprim] Other (See Comments)    ? Took Bactrim and Flexeril at same time had lip swelling unsure which caused reaction.  . Bromocriptine Nausea And Vomiting  . Flexeril [Cyclobenzaprine] Swelling  . Invokana [Canagliflozin]     balanitis    Family History  Problem Relation Age of Onset  . Stroke Mother   . Pulmonary embolism Father   . Diabetes Brother     BP 110/70   Pulse 80   Ht 5\' 10"  (1.778 m)   Wt 187 lb (84.8 kg)   SpO2 97%   BMI 26.83 kg/m    Review of Systems He denies nausea    Objective:   Physical Exam VITAL SIGNS:  See vs page GENERAL: no distress Pulses: dorsalis pedis intact bilat.   MSK: no deformity of the feet CV: no leg edema Skin:  no ulcer on the feet.  normal color and temp on the feet. Neuro: sensation is intact to touch on the feet  Lab Results  Component Value Date   HGBA1C 6.7 (A) 06/05/2019       Assessment & Plan:  Type 2 DM: well-controlled CRI, new: we need to reduce metformin.   Patient Instructions  I have sent a prescription to your pharmacy, to increase  the Trulicity again, and: Please reduce the metformin to 1 pill per day. You can use up the current Trulicity by taking 1 shot every 5 days.  check your blood sugar once a day.  vary the time of day when you check, between before the 3 meals, and at bedtime.  also check if you have symptoms of your blood sugar being too high or too low.  please keep a record of the readings and bring it to your next appointment here (or you can bring the meter itself).  You can write it on any piece of paper.  please call us sooner if your blood sugar goes below 70, or if you have a lot of readings over 200. Please come back for a follow-up appointment in 3 months.

## 2019-06-05 NOTE — Patient Instructions (Addendum)
I have sent a prescription to your pharmacy, to increase the Trulicity again, and: Please reduce the metformin to 1 pill per day. You can use up the current Trulicity by taking 1 shot every 5 days.  check your blood sugar once a day.  vary the time of day when you check, between before the 3 meals, and at bedtime.  also check if you have symptoms of your blood sugar being too high or too low.  please keep a record of the readings and bring it to your next appointment here (or you can bring the meter itself).  You can write it on any piece of paper.  please call us sooner if your blood sugar goes below 70, or if you have a lot of readings over 200. Please come back for a follow-up appointment in 3 months.

## 2019-06-12 DIAGNOSIS — D0462 Carcinoma in situ of skin of left upper limb, including shoulder: Secondary | ICD-10-CM | POA: Diagnosis not present

## 2019-07-08 ENCOUNTER — Other Ambulatory Visit: Payer: Self-pay | Admitting: Family Medicine

## 2019-07-08 DIAGNOSIS — F411 Generalized anxiety disorder: Secondary | ICD-10-CM

## 2019-07-12 ENCOUNTER — Ambulatory Visit (INDEPENDENT_AMBULATORY_CARE_PROVIDER_SITE_OTHER): Payer: Medicare Other | Admitting: Family Medicine

## 2019-07-12 ENCOUNTER — Encounter: Payer: Self-pay | Admitting: Family Medicine

## 2019-07-12 ENCOUNTER — Other Ambulatory Visit: Payer: Self-pay

## 2019-07-12 VITALS — BP 130/70 | HR 75 | Temp 97.9°F | Resp 18 | Ht 70.0 in | Wt 188.2 lb

## 2019-07-12 DIAGNOSIS — I10 Essential (primary) hypertension: Secondary | ICD-10-CM

## 2019-07-12 DIAGNOSIS — Z79899 Other long term (current) drug therapy: Secondary | ICD-10-CM

## 2019-07-12 DIAGNOSIS — F419 Anxiety disorder, unspecified: Secondary | ICD-10-CM

## 2019-07-12 DIAGNOSIS — F411 Generalized anxiety disorder: Secondary | ICD-10-CM | POA: Diagnosis not present

## 2019-07-12 DIAGNOSIS — E781 Pure hyperglyceridemia: Secondary | ICD-10-CM | POA: Diagnosis not present

## 2019-07-12 DIAGNOSIS — E1165 Type 2 diabetes mellitus with hyperglycemia: Secondary | ICD-10-CM

## 2019-07-12 DIAGNOSIS — E785 Hyperlipidemia, unspecified: Secondary | ICD-10-CM

## 2019-07-12 LAB — COMPREHENSIVE METABOLIC PANEL
ALT: 23 U/L (ref 0–53)
AST: 26 U/L (ref 0–37)
Albumin: 4.5 g/dL (ref 3.5–5.2)
Alkaline Phosphatase: 50 U/L (ref 39–117)
BUN: 15 mg/dL (ref 6–23)
CO2: 27 mEq/L (ref 19–32)
Calcium: 8.9 mg/dL (ref 8.4–10.5)
Chloride: 101 mEq/L (ref 96–112)
Creatinine, Ser: 0.89 mg/dL (ref 0.40–1.50)
GFR: 83.34 mL/min (ref >60.00)
Glucose, Bld: 142 mg/dL — ABNORMAL HIGH (ref 70–99)
Potassium: 3.9 mEq/L (ref 3.5–5.1)
Sodium: 137 mEq/L (ref 135–145)
Total Bilirubin: 0.8 mg/dL (ref 0.2–1.2)
Total Protein: 6.7 g/dL (ref 6.0–8.3)

## 2019-07-12 LAB — LIPID PANEL
Cholesterol: 122 mg/dL (ref 0–200)
HDL: 55.6 mg/dL (ref >39.00)
LDL Cholesterol: 43 mg/dL (ref 0–99)
NonHDL: 65.98
Total CHOL/HDL Ratio: 2
Triglycerides: 117 mg/dL (ref 0.0–149.0)
VLDL: 23.4 mg/dL (ref 0.0–40.0)

## 2019-07-12 MED ORDER — ICOSAPENT ETHYL 1 G PO CAPS: 2.0000 g | ORAL_CAPSULE | Freq: Two times a day (BID) | ORAL | 1 refills | Status: DC

## 2019-07-12 MED ORDER — AMLODIPINE BESYLATE 10 MG PO TABS: 10.0000 mg | ORAL_TABLET | Freq: Every day | ORAL | 1 refills | Status: DC

## 2019-07-12 MED ORDER — CANDESARTAN CILEXETIL 16 MG PO TABS: 16.0000 mg | ORAL_TABLET | Freq: Every day | ORAL | 1 refills | Status: DC

## 2019-07-12 MED ORDER — ALPRAZOLAM 0.25 MG PO TABS: ORAL_TABLET | ORAL | 0 refills | Status: DC

## 2019-07-12 NOTE — Assessment & Plan Note (Signed)
.  hgba1c acceptable, minimize simple carbs. Increase exercise as tolerated. Continue current meds Lab Results  Component Value Date   HGBA1C 6.7 (A) 06/05/2019   Per endo

## 2019-07-12 NOTE — Assessment & Plan Note (Signed)
Stable con't prn xanax  Contract and uds updated Database reviewed

## 2019-07-12 NOTE — Progress Notes (Signed)
Patient ID: Bruce Romero, male    DOB: 11-Jun-1945  Age: 74 y.o. MRN: [id number]    Subjective:  Subjective  HPI Bruce Romero presents for f/u anxiety  , bp and lipids Pt with no complaints  Review of Systems  Constitutional: Negative for appetite change, diaphoresis, fatigue and unexpected weight change.  Eyes: Negative for pain, redness and visual disturbance.  Respiratory: Negative for cough, chest tightness, shortness of breath and wheezing.   Cardiovascular: Negative for chest pain, palpitations and leg swelling.  Endocrine: Negative for cold intolerance, heat intolerance, polydipsia, polyphagia and polyuria.  Genitourinary: Negative for difficulty urinating, dysuria and frequency.  Neurological: Negative for dizziness, light-headedness, numbness and headaches.    History Past Medical History:  Diagnosis Date  . Acute medial meniscal tear, left, initial encounter   . Anxiety   . Diabetes mellitus   . GERD (gastroesophageal reflux disease)   . Hyperlipidemia   . Hypertension   . Irritable bowel   . Spastic colon    anaphylaxis due to [place]    He has a past surgical history that includes Cholecystectomy; Nasal sinus surgery; Knee arthroscopy with medial menisectomy (Left, 01/15/2016); and Chondroplasty (Left, 01/15/2016).   His family history includes Diabetes in his brother; Pulmonary embolism in his father; Stroke in his mother.He reports that he has quit smoking. He has never used smokeless tobacco. He reports current alcohol use. He reports that he does not use drugs.  Current Outpatient Medications on File Prior to Visit  Medication Sig Dispense Refill  . albuterol (PROAIR HFA) 108 (90 Base) MCG/ACT inhaler Inhale 2 puffs every 6 (six) hours as needed into the lungs for wheezing. 1 Inhaler 0  . atorvastatin (LIPITOR) 10 MG tablet Take 1 tablet (10 mg total) by mouth daily. 90 tablet 1  . Cholecalciferol (VITAMIN D) 2000 UNITS tablet Take 2,000 Units by mouth  daily.      Marland Kitchen co-enzyme Q-10 30 MG capsule Take 100 mg by mouth daily.    Marland Kitchen dicyclomine (BENTYL) 20 MG tablet TAKE 1 TABLET BY MOUTH EVERY 6 HOURS 60 tablet 5  . Dulaglutide (TRULICITY) 4.5 YQ/0.3KV SOPN Inject 4.5 mg as directed once a week. 4 pen 11  . esomeprazole (NEXIUM) 40 MG capsule Take 20 mg by mouth daily.    . fluticasone (FLONASE) 50 MCG/ACT nasal spray Place into both nostrils daily.    . Insulin Pen Needle 32G X 6 MM MISC Use as directed with insulin pen 30 each 3  . linaclotide (LINZESS) 290 MCG CAPS capsule Take 1 capsule (290 mcg total) by mouth daily before breakfast. 90 capsule 0  . meloxicam (MOBIC) 7.5 MG tablet Take 1 tablet (7.5 mg total) by mouth daily. 14 tablet 0  . metFORMIN (GLUCOPHAGE-XR) 750 MG 24 hr tablet Take 1 tablet (750 mg total) by mouth daily. 90 tablet 3  . niacin (SLO-NIACIN) 500 MG tablet Take 500 mg by mouth at bedtime.    Glory Rosebush VERIO test strip TEST ONCE DAILY 100 each 2  . rOPINIRole (REQUIP) 0.25 MG tablet 1 po qhs x 3 days then increase to 2 po qhs 60 tablet 0  . tamsulosin (FLOMAX) 0.4 MG CAPS capsule Take 1 capsule (0.4 mg total) by mouth daily. 30 capsule 3   No current facility-administered medications on file prior to visit.     Objective:  Objective  Physical Exam Vitals and nursing note reviewed.  Constitutional:      General: He is sleeping. He is  not in acute distress.    Appearance: He is well-developed.  HENT:     Head: Normocephalic and atraumatic.  Eyes:     Pupils: Pupils are equal, round, and reactive to light.  Neck:     Thyroid: No thyromegaly.  Cardiovascular:     Rate and Rhythm: Normal rate and regular rhythm.     Heart sounds: Normal heart sounds. No murmur.  Pulmonary:     Effort: Pulmonary effort is normal. No respiratory distress.     Breath sounds: Normal breath sounds. No wheezing or rales.  Chest:     Chest wall: No tenderness.  Musculoskeletal:        General: No tenderness.     Cervical back:  Normal range of motion and neck supple.  Skin:    General: Skin is warm and dry.  Neurological:     Mental Status: He is oriented to person, place, and time.  Psychiatric:        Behavior: Behavior normal.        Thought Content: Thought content normal.        Judgment: Judgment normal.    BP 130/70 (BP Location: Left Arm, Patient Position: Sitting, Cuff Size: Normal)   Pulse 75   Temp 97.9 F (36.6 C) (Temporal)   Resp 18   Ht 5\' 10"  (1.778 m)   Wt 188 lb 3.2 oz (85.4 kg)   SpO2 98%   BMI 27.00 kg/m  Wt Readings from Last 3 Encounters:  07/12/19 188 lb 3.2 oz (85.4 kg)  06/05/19 187 lb (84.8 kg)  04/03/19 200 lb 6.4 oz (90.9 kg)     Lab Results  Component Value Date   WBC 7.3 03/30/2018   HGB 14.2 03/30/2018   HCT 41.6 03/30/2018   PLT 191.0 03/30/2018   GLUCOSE 142 (H) 07/12/2019   CHOL 122 07/12/2019   TRIG 117.0 07/12/2019   HDL 55.60 07/12/2019   LDLDIRECT 74.0 03/30/2018   LDLCALC 43 07/12/2019   ALT 23 07/12/2019   AST 26 07/12/2019   NA 137 07/12/2019   K 3.9 07/12/2019   CL 101 07/12/2019   CREATININE 0.89 07/12/2019   BUN 15 07/12/2019   CO2 27 07/12/2019   TSH 1.61 06/27/2016   PSA 0.44 10/30/2017   HGBA1C 6.7 (A) 06/05/2019   MICROALBUR <0.7 06/27/2016    No results found.   Assessment & Plan:  Plan  I have changed Bruce Romero "Bruce Romero"'s candesartan. I am also having him maintain his Vitamin D, esomeprazole, niacin, co-enzyme Q-10, fluticasone, Insulin Pen Needle, albuterol, meloxicam, tamsulosin, dicyclomine, rOPINIRole, OneTouch Verio, atorvastatin, linaclotide, Trulicity, metFORMIN, ALPRAZolam, icosapent Ethyl, and amLODipine.  Meds ordered this encounter  Medications  . ALPRAZolam (XANAX) 0.25 MG tablet    Sig: take 1 tablet by mouth three times a day if needed    Dispense:  30 tablet    Refill:  0  . icosapent Ethyl (VASCEPA) 1 g capsule    Sig: Take 2 capsules (2 g total) by mouth 2 (two) times daily.    Dispense:  360 capsule     Refill:  1  . candesartan (ATACAND) 16 MG tablet    Sig: Take 1 tablet (16 mg total) by mouth at bedtime.    Dispense:  90 tablet    Refill:  1  . amLODipine (NORVASC) 10 MG tablet    Sig: Take 1 tablet (10 mg total) by mouth daily.    Dispense:  90 tablet  Refill:  1    Problem List Items Addressed This Visit      Unprioritized   Essential hypertension - Primary   Relevant Medications   icosapent Ethyl (VASCEPA) 1 g capsule   candesartan (ATACAND) 16 MG tablet   amLODipine (NORVASC) 10 MG tablet   Other Relevant Orders   Lipid panel (Completed)   Comprehensive metabolic panel (Completed)    Other Visit Diagnoses    Generalized anxiety disorder       Relevant Medications   ALPRAZolam (XANAX) 0.25 MG tablet   Other Relevant Orders   DRUG MONITORING, PANEL 8 WITH CONFIRMATION, URINE   Hypertriglyceridemia       Relevant Medications   icosapent Ethyl (VASCEPA) 1 g capsule   candesartan (ATACAND) 16 MG tablet   amLODipine (NORVASC) 10 MG tablet   Other Relevant Orders   Lipid panel (Completed)   Comprehensive metabolic panel (Completed)   High risk medication use       Relevant Orders   DRUG MONITORING, PANEL 8 WITH CONFIRMATION, URINE      Follow-up: Return in about 6 months (around 01/11/2020) for annual exam, fasting.  Ann Held, DO

## 2019-07-12 NOTE — Patient Instructions (Addendum)
DASH Eating Plan DASH stands for "Dietary Approaches to Stop Hypertension." The DASH eating plan is a healthy eating plan that has been shown to reduce high blood pressure (hypertension). It may also reduce your risk for type 2 diabetes, heart disease, and stroke. The DASH eating plan may also help with weight loss. What are tips for following this plan?  General guidelines  Avoid eating more than 2,300 mg (milligrams) of salt (sodium) a day. If you have hypertension, you may need to reduce your sodium intake to 1,500 mg a day.  Limit alcohol intake to no more than 1 drink a day for nonpregnant women and 2 drinks a day for men. One drink equals 12 oz of beer, 5 oz of wine, or 1 oz of hard liquor.  Work with your health care provider to maintain a healthy body weight or to lose weight. Ask what an ideal weight is for you.  Get at least 30 minutes of exercise that causes your heart to beat faster (aerobic exercise) most days of the week. Activities may include walking, swimming, or biking.  Work with your health care provider or diet and nutrition specialist (dietitian) to adjust your eating plan to your individual calorie needs. Reading food labels   Check food labels for the amount of sodium per serving. Choose foods with less than 5 percent of the Daily Value of sodium. Generally, foods with less than 300 mg of sodium per serving fit into this eating plan.  To find whole grains, look for the word "whole" as the first word in the ingredient list. Shopping  Buy products labeled as "low-sodium" or "no salt added."  Buy fresh foods. Avoid canned foods and premade or frozen meals. Cooking  Avoid adding salt when cooking. Use salt-free seasonings or herbs instead of table salt or sea salt. Check with your health care provider or pharmacist before using salt substitutes.  Do not fry foods. Cook foods using healthy methods such as baking, boiling, grilling, and broiling instead.  Cook with  heart-healthy oils, such as olive, canola, soybean, or sunflower oil. Meal planning  Eat a balanced diet that includes: ? 5 or more servings of fruits and vegetables each day. At each meal, try to fill half of your plate with fruits and vegetables. ? Up to 6-8 servings of whole grains each day. ? Less than 6 oz of lean meat, poultry, or fish each day. A 3-oz serving of meat is about the same size as a deck of cards. One egg equals 1 oz. ? 2 servings of low-fat dairy each day. ? A serving of nuts, seeds, or beans 5 times each week. ? Heart-healthy fats. Healthy fats called Omega-3 fatty acids are found in foods such as flaxseeds and coldwater fish, like sardines, salmon, and mackerel.  Limit how much you eat of the following: ? Canned or prepackaged foods. ? Food that is high in trans fat, such as fried foods. ? Food that is high in saturated fat, such as fatty meat. ? Sweets, desserts, sugary drinks, and other foods with added sugar. ? Full-fat dairy products.  Do not salt foods before eating.  Try to eat at least 2 vegetarian meals each week.  Eat more home-cooked food and less restaurant, buffet, and fast food.  When eating at a restaurant, ask that your food be prepared with less salt or no salt, if possible. What foods are recommended? The items listed may not be a complete list. Talk with your dietitian about   what dietary choices are best for you. Grains Whole-grain or whole-wheat bread. Whole-grain or whole-wheat pasta. Brown rice. Oatmeal. Quinoa. Bulgur. Whole-grain and low-sodium cereals. Pita bread. Low-fat, low-sodium crackers. Whole-wheat flour tortillas. Vegetables Fresh or frozen vegetables (raw, steamed, roasted, or grilled). Low-sodium or reduced-sodium tomato and vegetable juice. Low-sodium or reduced-sodium tomato sauce and tomato paste. Low-sodium or reduced-sodium canned vegetables. Fruits All fresh, dried, or frozen fruit. Canned fruit in natural juice (without  added sugar). Meat and other protein foods Skinless chicken or turkey. Ground chicken or turkey. Pork with fat trimmed off. Fish and seafood. Egg whites. Dried beans, peas, or lentils. Unsalted nuts, nut butters, and seeds. Unsalted canned beans. Lean cuts of beef with fat trimmed off. Low-sodium, lean deli meat. Dairy Low-fat (1%) or fat-free (skim) milk. Fat-free, low-fat, or reduced-fat cheeses. Nonfat, low-sodium ricotta or cottage cheese. Low-fat or nonfat yogurt. Low-fat, low-sodium cheese. Fats and oils Soft margarine without trans fats. Vegetable oil. Low-fat, reduced-fat, or light mayonnaise and salad dressings (reduced-sodium). Canola, safflower, olive, soybean, and sunflower oils. Avocado. Seasoning and other foods Herbs. Spices. Seasoning mixes without salt. Unsalted popcorn and pretzels. Fat-free sweets. What foods are not recommended? The items listed may not be a complete list. Talk with your dietitian about what dietary choices are best for you. Grains Baked goods made with fat, such as croissants, muffins, or some breads. Dry pasta or rice meal packs. Vegetables Creamed or fried vegetables. Vegetables in a cheese sauce. Regular canned vegetables (not low-sodium or reduced-sodium). Regular canned tomato sauce and paste (not low-sodium or reduced-sodium). Regular tomato and vegetable juice (not low-sodium or reduced-sodium). Pickles. Olives. Fruits Canned fruit in a light or heavy syrup. Fried fruit. Fruit in cream or butter sauce. Meat and other protein foods Fatty cuts of meat. Ribs. Fried meat. Bacon. Sausage. Bologna and other processed lunch meats. Salami. Fatback. Hotdogs. Bratwurst. Salted nuts and seeds. Canned beans with added salt. Canned or smoked fish. Whole eggs or egg yolks. Chicken or turkey with skin. Dairy Whole or 2% milk, cream, and half-and-half. Whole or full-fat cream cheese. Whole-fat or sweetened yogurt. Full-fat cheese. Nondairy creamers. Whipped toppings.  Processed cheese and cheese spreads. Fats and oils Butter. Stick margarine. Lard. Shortening. Ghee. Bacon fat. Tropical oils, such as coconut, palm kernel, or palm oil. Seasoning and other foods Salted popcorn and pretzels. Onion salt, garlic salt, seasoned salt, table salt, and sea salt. Worcestershire sauce. Tartar sauce. Barbecue sauce. Teriyaki sauce. Soy sauce, including reduced-sodium. Steak sauce. Canned and packaged gravies. Fish sauce. Oyster sauce. Cocktail sauce. Horseradish that you find on the shelf. Ketchup. Mustard. Meat flavorings and tenderizers. Bouillon cubes. Hot sauce and Tabasco sauce. Premade or packaged marinades. Premade or packaged taco seasonings. Relishes. Regular salad dressings. Where to find more information:  National Heart, Lung, and Blood Institute: www.nhlbi.nih.gov  American Heart Association: www.heart.org Summary  The DASH eating plan is a healthy eating plan that has been shown to reduce high blood pressure (hypertension). It may also reduce your risk for type 2 diabetes, heart disease, and stroke.  With the DASH eating plan, you should limit salt (sodium) intake to 2,300 mg a day. If you have hypertension, you may need to reduce your sodium intake to 1,500 mg a day.  When on the DASH eating plan, aim to eat more fresh fruits and vegetables, whole grains, lean proteins, low-fat dairy, and heart-healthy fats.  Work with your health care provider or diet and nutrition specialist (dietitian) to adjust your eating plan to your   individual calorie needs. This information is not intended to replace advice given to you by your health care provider. Make sure you discuss any questions you have with your health care provider. Document Revised: 01/06/2017 Document Reviewed: 01/18/2016 Elsevier Patient Education  2020 [employer] Inc.  

## 2019-07-12 NOTE — Assessment & Plan Note (Signed)
Well controlled, no changes to meds. Encouraged heart healthy diet such as the DASH diet and exercise as tolerated.  °

## 2019-07-12 NOTE — Assessment & Plan Note (Signed)
Tolerating statin, encouraged heart healthy diet, avoid trans fats, minimize simple carbs and saturated fats. Increase exercise as tolerated 

## 2019-07-13 LAB — DRUG MONITORING, PANEL 8 WITH CONFIRMATION, URINE
6 Acetylmorphine: NEGATIVE ng/mL (ref <10)
Alcohol Metabolites: NEGATIVE ng/mL
Amphetamines: NEGATIVE ng/mL (ref <500)
Benzodiazepines: NEGATIVE ng/mL (ref <100)
Buprenorphine, Urine: NEGATIVE ng/mL (ref <5)
Cocaine Metabolite: NEGATIVE ng/mL (ref <150)
Creatinine: 47.3 mg/dL
MDMA: NEGATIVE ng/mL (ref <500)
Marijuana Metabolite: NEGATIVE ng/mL (ref <20)
Opiates: NEGATIVE ng/mL (ref <100)
Oxidant: NEGATIVE ug/mL
Oxycodone: NEGATIVE ng/mL (ref <100)
pH: 5.6 (ref 4.5–9.0)

## 2019-07-13 LAB — DM TEMPLATE

## 2019-07-18 DIAGNOSIS — C4441 Basal cell carcinoma of skin of scalp and neck: Secondary | ICD-10-CM | POA: Diagnosis not present

## 2019-08-07 ENCOUNTER — Other Ambulatory Visit: Payer: Self-pay

## 2019-08-07 ENCOUNTER — Other Ambulatory Visit: Payer: Self-pay | Admitting: Endocrinology

## 2019-08-07 ENCOUNTER — Encounter: Payer: Self-pay | Admitting: Family Medicine

## 2019-08-07 DIAGNOSIS — E119 Type 2 diabetes mellitus without complications: Secondary | ICD-10-CM

## 2019-08-07 MED ORDER — TRULICITY 4.5 MG/0.5ML ~~LOC~~ SOAJ: 4.5000 mg | SUBCUTANEOUS | 11 refills | Status: DC

## 2019-08-07 MED ORDER — ATORVASTATIN CALCIUM 10 MG PO TABS: 10.0000 mg | ORAL_TABLET | Freq: Every day | ORAL | 1 refills | Status: DC

## 2019-08-08 ENCOUNTER — Encounter: Payer: Self-pay | Admitting: Family Medicine

## 2019-08-08 ENCOUNTER — Ambulatory Visit (INDEPENDENT_AMBULATORY_CARE_PROVIDER_SITE_OTHER): Payer: Medicare Other | Admitting: Family Medicine

## 2019-08-08 ENCOUNTER — Other Ambulatory Visit: Payer: Self-pay

## 2019-08-08 VITALS — BP 112/68 | HR 92 | Temp 97.9°F | Resp 18 | Ht 70.0 in | Wt 189.6 lb

## 2019-08-08 DIAGNOSIS — R350 Frequency of micturition: Secondary | ICD-10-CM

## 2019-08-08 LAB — POC URINALSYSI DIPSTICK (AUTOMATED)
Bilirubin, UA: NEGATIVE
Blood, UA: NEGATIVE
Glucose, UA: NEGATIVE
Ketones, UA: NEGATIVE
Leukocytes, UA: NEGATIVE
Nitrite, UA: NEGATIVE
Protein, UA: NEGATIVE
Spec Grav, UA: 1.015 (ref 1.010–1.025)
Urobilinogen, UA: 0.2 E.U./dL
pH, UA: 5 (ref 5.0–8.0)

## 2019-08-08 MED ORDER — CIPROFLOXACIN HCL 500 MG PO TABS: 500.0000 mg | ORAL_TABLET | Freq: Two times a day (BID) | ORAL | 0 refills | Status: AC

## 2019-08-08 MED ORDER — TAMSULOSIN HCL 0.4 MG PO CAPS: 0.4000 mg | ORAL_CAPSULE | Freq: Every day | ORAL | 3 refills | Status: DC

## 2019-08-08 MED ORDER — MELOXICAM 7.5 MG PO TABS: 7.5000 mg | ORAL_TABLET | Freq: Every day | ORAL | 0 refills | Status: DC

## 2019-08-08 NOTE — Patient Instructions (Signed)
Urinary Frequency, Adult Urinary frequency means urinating more often than usual. You may urinate every 1-2 hours even though you drink a normal amount of fluid and do not have a bladder infection or condition. Although you urinate more often than normal, the total amount of urine produced in a day is normal. With urinary frequency, you may have an urgent need to urinate often. The stress and anxiety of needing to find a bathroom quickly can make this urge worse. This condition may go away on its own or you may need treatment at home. Home treatment may include bladder training, exercises, taking medicines, or making changes to your diet. Follow these instructions at home: Bladder health   Keep a bladder diary if told by your health care provider. Keep track of: ? What you eat and drink. ? How often you urinate. ? How much you urinate.  Follow a bladder training program if told by your health care provider. This may include: ? Learning to delay going to the bathroom. ? Double urinating (voiding). This helps if you are not completely emptying your bladder. ? Scheduled voiding.  Do Kegel exercises as told by your health care provider. Kegel exercises strengthen the muscles that help control urination, which may help the condition. Eating and drinking  If told by your health care provider, make diet changes, such as: ? Avoiding caffeine. ? Drinking fewer fluids, especially alcohol. ? Not drinking in the evening. ? Avoiding foods or drinks that may irritate the bladder. These include coffee, tea, soda, artificial sweeteners, citrus, tomato-based foods, and chocolate. ? Eating foods that help prevent or ease constipation. Constipation can make this condition worse. Your health care provider may recommend that you:  Drink enough fluid to keep your urine pale yellow.  Take over-the-counter or prescription medicines.  Eat foods that are high in fiber, such as beans, whole grains, and fresh  fruits and vegetables.  Limit foods that are high in fat and processed sugars, such as fried or sweet foods. General instructions  Take over-the-counter and prescription medicines only as told by your health care provider.  Keep all follow-up visits as told by your health care provider. This is important. Contact a health care provider if:  You start urinating more often.  You feel pain or irritation when you urinate.  You notice blood in your urine.  Your urine looks cloudy.  You develop a fever.  You begin vomiting. Get help right away if:  You are unable to urinate. Summary  Urinary frequency means urinating more often than usual. With urinary frequency, you may urinate every 1-2 hours even though you drink a normal amount of fluid and do not have a bladder infection or other bladder condition.  Your health care provider may recommend that you keep a bladder diary, follow a bladder training program, or make dietary changes.  If told by your health care provider, do Kegel exercises to strengthen the muscles that help control urination.  Take over-the-counter and prescription medicines only as told by your health care provider.  Contact a health care provider if your symptoms do not improve or get worse. This information is not intended to replace advice given to you by your health care provider. Make sure you discuss any questions you have with your health care provider. Document Revised: 08/03/2017 Document Reviewed: 08/03/2017 Elsevier Patient Education  2020 [employer].  

## 2019-08-08 NOTE — Progress Notes (Signed)
Patient ID: Bruce Romero, male    DOB: 1945-08-15  Age: 74 y.o. MRN: [id number]    Subjective:  Subjective  HPI Bruce Romero presents for urinary frequency-- no dysuria.  + suprapubic pain    No fever, no hematuria   Review of Systems  Constitutional: Negative for appetite change, diaphoresis, fatigue and unexpected weight change.  Eyes: Negative for pain, redness and visual disturbance.  Respiratory: Negative for cough, chest tightness, shortness of breath and wheezing.   Cardiovascular: Negative for chest pain, palpitations and leg swelling.  Endocrine: Negative for cold intolerance, heat intolerance, polydipsia, polyphagia and polyuria.  Genitourinary: Positive for frequency. Negative for decreased urine volume, difficulty urinating, dysuria, flank pain, hematuria and urgency.  Neurological: Negative for dizziness, light-headedness, numbness and headaches.    History Past Medical History:  Diagnosis Date  . Acute medial meniscal tear, left, initial encounter   . Anxiety   . Diabetes mellitus   . GERD (gastroesophageal reflux disease)   . Hyperlipidemia   . Hypertension   . Irritable bowel   . Spastic colon    anaphylaxis due to [place]    He has a past surgical history that includes Cholecystectomy; Nasal sinus surgery; Knee arthroscopy with medial menisectomy (Left, 01/15/2016); and Chondroplasty (Left, 01/15/2016).   His family history includes Diabetes in his brother; Pulmonary embolism in his father; Stroke in his mother.He reports that he has quit smoking. He has never used smokeless tobacco. He reports current alcohol use. He reports that he does not use drugs.  Current Outpatient Medications on File Prior to Visit  Medication Sig Dispense Refill  . albuterol (PROAIR HFA) 108 (90 Base) MCG/ACT inhaler Inhale 2 puffs every 6 (six) hours as needed into the lungs for wheezing. 1 Inhaler 0  . ALPRAZolam (XANAX) 0.25 MG tablet take 1 tablet by mouth three times a  day if needed 30 tablet 0  . amLODipine (NORVASC) 10 MG tablet Take 1 tablet (10 mg total) by mouth daily. 90 tablet 1  . atorvastatin (LIPITOR) 10 MG tablet Take 1 tablet (10 mg total) by mouth daily. 90 tablet 1  . candesartan (ATACAND) 16 MG tablet Take 1 tablet (16 mg total) by mouth at bedtime. 90 tablet 1  . Cholecalciferol (VITAMIN D) 2000 UNITS tablet Take 2,000 Units by mouth daily.      Marland Kitchen co-enzyme Q-10 30 MG capsule Take 100 mg by mouth daily.    Marland Kitchen dicyclomine (BENTYL) 20 MG tablet TAKE 1 TABLET BY MOUTH EVERY 6 HOURS 60 tablet 5  . Dulaglutide (TRULICITY) 4.5 NW/2.9FA SOPN Inject 0.5 mLs (4.5 mg total) as directed once a week. 4 pen 11  . esomeprazole (NEXIUM) 40 MG capsule Take 20 mg by mouth daily.    . fluticasone (FLONASE) 50 MCG/ACT nasal spray Place into both nostrils daily.    Marland Kitchen icosapent Ethyl (VASCEPA) 1 g capsule Take 2 capsules (2 g total) by mouth 2 (two) times daily. 360 capsule 1  . Insulin Pen Needle 32G X 6 MM MISC Use as directed with insulin pen 30 each 3  . linaclotide (LINZESS) 290 MCG CAPS capsule Take 1 capsule (290 mcg total) by mouth daily before breakfast. 90 capsule 0  . meloxicam (MOBIC) 7.5 MG tablet Take 1 tablet (7.5 mg total) by mouth daily. 14 tablet 0  . metFORMIN (GLUCOPHAGE-XR) 750 MG 24 hr tablet Take 1 tablet (750 mg total) by mouth daily. 90 tablet 3  . niacin (SLO-NIACIN) 500 MG tablet Take 500  mg by mouth at bedtime.    Glory Rosebush VERIO test strip TEST ONCE DAILY 100 each 2  . rOPINIRole (REQUIP) 0.25 MG tablet 1 po qhs x 3 days then increase to 2 po qhs 60 tablet 0  . tamsulosin (FLOMAX) 0.4 MG CAPS capsule Take 1 capsule (0.4 mg total) by mouth daily. 30 capsule 3   No current facility-administered medications on file prior to visit.     Objective:  Objective  Physical Exam Vitals and nursing note reviewed.  Constitutional:      General: He is not in acute distress.    Appearance: He is well-developed.  Cardiovascular:     Rate and  Rhythm: Normal rate and regular rhythm.     Heart sounds: Normal heart sounds.  Pulmonary:     Effort: Pulmonary effort is normal. No respiratory distress.     Breath sounds: Normal breath sounds.  Abdominal:     General: There is no distension.     Palpations: Abdomen is soft. There is no mass.     Tenderness: There is abdominal tenderness in the suprapubic area. There is no right CVA tenderness, left CVA tenderness, guarding or rebound.     Hernia: No hernia is present.  Genitourinary:    Prostate: Normal.     Rectum: Normal. Guaiac result negative.  Psychiatric:        Behavior: Behavior normal.        Thought Content: Thought content normal.        Judgment: Judgment normal.    BP 112/68 (BP Location: Right Arm, Patient Position: Sitting, Cuff Size: Normal)   Pulse 92   Temp 97.9 F (36.6 C) (Temporal)   Resp 18   Ht 5\' 10"  (1.778 m)   Wt 189 lb 9.6 oz (86 kg)   SpO2 99%   BMI 27.20 kg/m  Wt Readings from Last 3 Encounters:  08/08/19 189 lb 9.6 oz (86 kg)  07/12/19 188 lb 3.2 oz (85.4 kg)  06/05/19 187 lb (84.8 kg)     Lab Results  Component Value Date   WBC 7.3 03/30/2018   HGB 14.2 03/30/2018   HCT 41.6 03/30/2018   PLT 191.0 03/30/2018   GLUCOSE 142 (H) 07/12/2019   CHOL 122 07/12/2019   TRIG 117.0 07/12/2019   HDL 55.60 07/12/2019   LDLDIRECT 74.0 03/30/2018   LDLCALC 43 07/12/2019   ALT 23 07/12/2019   AST 26 07/12/2019   NA 137 07/12/2019   K 3.9 07/12/2019   CL 101 07/12/2019   CREATININE 0.89 07/12/2019   BUN 15 07/12/2019   CO2 27 07/12/2019   TSH 1.61 06/27/2016   PSA 0.44 10/30/2017   HGBA1C 6.7 (A) 06/05/2019   MICROALBUR <0.7 06/27/2016    No results found.   Assessment & Plan:  Plan  I am having Bruce Romero "Clay" start on ciprofloxacin, meloxicam, and tamsulosin. I am also having him maintain his Vitamin D, esomeprazole, niacin, co-enzyme Q-10, fluticasone, Insulin Pen Needle, albuterol, meloxicam, tamsulosin, dicyclomine,  rOPINIRole, OneTouch Verio, linaclotide, metFORMIN, ALPRAZolam, icosapent Ethyl, candesartan, amLODipine, Trulicity, and atorvastatin.  Meds ordered this encounter  Medications  . ciprofloxacin (CIPRO) 500 MG tablet    Sig: Take 1 tablet (500 mg total) by mouth 2 (two) times daily for 28 doses.    Dispense:  28 tablet    Refill:  0  . meloxicam (MOBIC) 7.5 MG tablet    Sig: Take 1 tablet (7.5 mg total) by mouth daily.  Dispense:  30 tablet    Refill:  0  . tamsulosin (FLOMAX) 0.4 MG CAPS capsule    Sig: Take 1 capsule (0.4 mg total) by mouth daily.    Dispense:  30 capsule    Refill:  3    Problem List Items Addressed This Visit      Unprioritized   FREQUENCY, URINARY - Primary   Relevant Medications   ciprofloxacin (CIPRO) 500 MG tablet   meloxicam (MOBIC) 7.5 MG tablet   tamsulosin (FLOMAX) 0.4 MG CAPS capsule   Other Relevant Orders   POCT Urinalysis Dipstick (Automated) (Completed)   Urine Culture   PSA   CBC with Differential/Platelet    consider f/u urology Follow-up: No follow-ups on file.  Ann Held, DO

## 2019-08-09 LAB — CBC WITH DIFFERENTIAL/PLATELET
Basophils Absolute: 0.1 10*3/uL (ref 0.0–0.1)
Basophils Relative: 0.7 % (ref 0.0–3.0)
Eosinophils Absolute: 0.3 10*3/uL (ref 0.0–0.7)
Eosinophils Relative: 3.2 % (ref 0.0–5.0)
HCT: 40 % (ref 39.0–52.0)
Hemoglobin: 13.6 g/dL (ref 13.0–17.0)
Lymphocytes Relative: 27.9 % (ref 12.0–46.0)
Lymphs Abs: 2.3 10*3/uL (ref 0.7–4.0)
MCHC: 34 g/dL (ref 30.0–36.0)
MCV: 87.1 fl (ref 78.0–100.0)
Monocytes Absolute: 0.5 10*3/uL (ref 0.1–1.0)
Monocytes Relative: 6.5 % (ref 3.0–12.0)
Neutro Abs: 5.1 10*3/uL (ref 1.4–7.7)
Neutrophils Relative %: 61.7 % (ref 43.0–77.0)
Platelets: 215 10*3/uL (ref 150.0–400.0)
RBC: 4.59 Mil/uL (ref 4.22–5.81)
RDW: 13.5 % (ref 11.5–15.5)
WBC: 8.3 10*3/uL (ref 4.0–10.5)

## 2019-08-09 LAB — PSA: PSA: 0.36 ng/mL (ref 0.10–4.00)

## 2019-08-09 LAB — URINE CULTURE
MICRO NUMBER:: 10657986
Result:: NO GROWTH
SPECIMEN QUALITY:: ADEQUATE

## 2019-08-14 DIAGNOSIS — N411 Chronic prostatitis: Secondary | ICD-10-CM | POA: Diagnosis not present

## 2019-08-29 ENCOUNTER — Encounter: Payer: Self-pay | Admitting: Family Medicine

## 2019-08-29 DIAGNOSIS — Z79899 Other long term (current) drug therapy: Secondary | ICD-10-CM | POA: Diagnosis not present

## 2019-08-29 DIAGNOSIS — E860 Dehydration: Secondary | ICD-10-CM | POA: Diagnosis not present

## 2019-08-29 DIAGNOSIS — I10 Essential (primary) hypertension: Secondary | ICD-10-CM | POA: Diagnosis not present

## 2019-08-29 DIAGNOSIS — Z794 Long term (current) use of insulin: Secondary | ICD-10-CM | POA: Diagnosis not present

## 2019-08-29 DIAGNOSIS — N411 Chronic prostatitis: Secondary | ICD-10-CM | POA: Diagnosis not present

## 2019-08-29 DIAGNOSIS — R531 Weakness: Secondary | ICD-10-CM | POA: Diagnosis not present

## 2019-08-29 DIAGNOSIS — E119 Type 2 diabetes mellitus without complications: Secondary | ICD-10-CM | POA: Diagnosis not present

## 2019-08-29 DIAGNOSIS — Z881 Allergy status to other antibiotic agents status: Secondary | ICD-10-CM | POA: Diagnosis not present

## 2019-08-29 DIAGNOSIS — R109 Unspecified abdominal pain: Secondary | ICD-10-CM | POA: Diagnosis not present

## 2019-09-04 ENCOUNTER — Other Ambulatory Visit: Payer: Self-pay

## 2019-09-04 ENCOUNTER — Ambulatory Visit: Payer: Medicare Other | Admitting: Endocrinology

## 2019-09-04 ENCOUNTER — Encounter: Payer: Self-pay | Admitting: Endocrinology

## 2019-09-04 VITALS — BP 126/80 | HR 98 | Ht 70.0 in | Wt 181.0 lb

## 2019-09-04 DIAGNOSIS — E119 Type 2 diabetes mellitus without complications: Secondary | ICD-10-CM | POA: Diagnosis not present

## 2019-09-04 LAB — POCT GLYCOSYLATED HEMOGLOBIN (HGB A1C): Hemoglobin A1C: 6.7 % — AB (ref 4.0–5.6)

## 2019-09-04 LAB — TSH: TSH: 2.28 u[IU]/mL (ref 0.35–4.50)

## 2019-09-04 NOTE — Progress Notes (Signed)
Subjective:    Patient ID: Bruce Romero, male    DOB: 09-May-1945, 74 y.o.   MRN: [id number]  HPI Pt returns for f/u of diabetes mellitus:  DM type: 2 Dx'ed: 5102 Complications: PN and CRI Therapy: trulicity and metformin.  DKA: never.  Severe hypoglycemia: never.   Pancreatitis: never.  Other: he has never been on insulin; he did not tolerate invokana (yeast infection).   Interval history:  pt states he feels well in general.  He says cbg's are mostly in the 100's.  He takes meds as rx'ed.   Past Medical History:  Diagnosis Date  . Acute medial meniscal tear, left, initial encounter   . Anxiety   . Diabetes mellitus   . GERD (gastroesophageal reflux disease)   . Hyperlipidemia   . Hypertension   . Irritable bowel   . Spastic colon    anaphylaxis due to [place]    Past Surgical History:  Procedure Laterality Date  . CHOLECYSTECTOMY    . CHONDROPLASTY Left 01/15/2016   Procedure: CHONDROPLASTY;  Surgeon: Melrose Nakayama, MD;  Location: [facility];  Service: Orthopedics;  Laterality: Left;  . KNEE ARTHROSCOPY WITH MEDIAL MENISECTOMY Left 01/15/2016   Procedure: ARTHROSCOPY LEFT KNEE WITH PARTIAL MEDIAL MENISECTOMY;  Surgeon: Melrose Nakayama, MD;  Location: [facility];  Service: Orthopedics;  Laterality: Left;  . NASAL SINUS SURGERY      Social History   Socioeconomic History  . Marital status: Married    Spouse name: Not on file  . Number of children: Not on file  . Years of education: Not on file  . Highest education level: Not on file  Occupational History  . Occupation: retired  Tobacco Use  . Smoking status: Former Research scientist (life sciences)  . Smokeless tobacco: Never Used  Substance and Sexual Activity  . Alcohol use: Yes    Alcohol/week: 0.0 standard drinks    Comment: social  . Drug use: No  . Sexual activity: Yes  Other Topics Concern  . Not on file  Social History Narrative   Exercise try to everyday.   Social Determinants of Health    Financial Resource Strain:   . Difficulty of Paying Living Expenses:   Food Insecurity:   . Worried About Charity fundraiser in the Last Year:   . Arboriculturist in the Last Year:   Transportation Needs:   . Film/video editor (Medical):   Marland Kitchen Lack of Transportation (Non-Medical):   Physical Activity:   . Days of Exercise per Week:   . Minutes of Exercise per Session:   Stress:   . Feeling of Stress :   Social Connections:   . Frequency of Communication with Friends and Family:   . Frequency of Social Gatherings with Friends and Family:   . Attends Religious Services:   . Active Member of Clubs or Organizations:   . Attends Archivist Meetings:   Marland Kitchen Marital Status:   Intimate Partner Violence:   . Fear of Current or Ex-Partner:   . Emotionally Abused:   Marland Kitchen Physically Abused:   . Sexually Abused:     Current Outpatient Medications on File Prior to Visit  Medication Sig Dispense Refill  . albuterol (PROAIR HFA) 108 (90 Base) MCG/ACT inhaler Inhale 2 puffs every 6 (six) hours as needed into the lungs for wheezing. 1 Inhaler 0  . ALPRAZolam (XANAX) 0.25 MG tablet take 1 tablet by mouth three times a day if needed 30  tablet 0  . amLODipine (NORVASC) 10 MG tablet Take 1 tablet (10 mg total) by mouth daily. 90 tablet 1  . atorvastatin (LIPITOR) 10 MG tablet Take 1 tablet (10 mg total) by mouth daily. 90 tablet 1  . candesartan (ATACAND) 16 MG tablet Take 1 tablet (16 mg total) by mouth at bedtime. 90 tablet 1  . Cholecalciferol (VITAMIN D) 2000 UNITS tablet Take 2,000 Units by mouth daily.      Marland Kitchen co-enzyme Q-10 30 MG capsule Take 100 mg by mouth daily.    Marland Kitchen dicyclomine (BENTYL) 20 MG tablet TAKE 1 TABLET BY MOUTH EVERY 6 HOURS 60 tablet 5  . Dulaglutide (TRULICITY) 4.5 GU/4.4IH SOPN Inject 0.5 mLs (4.5 mg total) as directed once a week. 4 pen 11  . esomeprazole (NEXIUM) 40 MG capsule Take 20 mg by mouth daily.    . fluticasone (FLONASE) 50 MCG/ACT nasal spray Place  into both nostrils daily.    Marland Kitchen icosapent Ethyl (VASCEPA) 1 g capsule Take 2 capsules (2 g total) by mouth 2 (two) times daily. 360 capsule 1  . Insulin Pen Needle 32G X 6 MM MISC Use as directed with insulin pen 30 each 3  . linaclotide (LINZESS) 290 MCG CAPS capsule Take 1 capsule (290 mcg total) by mouth daily before breakfast. 90 capsule 0  . meloxicam (MOBIC) 7.5 MG tablet Take 1 tablet (7.5 mg total) by mouth daily. 14 tablet 0  . meloxicam (MOBIC) 7.5 MG tablet Take 1 tablet (7.5 mg total) by mouth daily. 30 tablet 0  . metFORMIN (GLUCOPHAGE-XR) 750 MG 24 hr tablet Take 1 tablet (750 mg total) by mouth daily. 90 tablet 3  . niacin (SLO-NIACIN) 500 MG tablet Take 500 mg by mouth at bedtime.    Glory Rosebush VERIO test strip TEST ONCE DAILY 100 each 2  . rOPINIRole (REQUIP) 0.25 MG tablet 1 po qhs x 3 days then increase to 2 po qhs 60 tablet 0  . tamsulosin (FLOMAX) 0.4 MG CAPS capsule Take 1 capsule (0.4 mg total) by mouth daily. 30 capsule 3  . tamsulosin (FLOMAX) 0.4 MG CAPS capsule Take 1 capsule (0.4 mg total) by mouth daily. 30 capsule 3   No current facility-administered medications on file prior to visit.    Allergies  Allergen Reactions  . Cephalexin Anaphylaxis    REACTION: pt is not allergic to penicillin  . Bactrim [Sulfamethoxazole-Trimethoprim] Other (See Comments)    ? Took Bactrim and Flexeril at same time had lip swelling unsure which caused reaction.  . Bromocriptine Nausea And Vomiting  . Flexeril [Cyclobenzaprine] Swelling  . Invokana [Canagliflozin]     balanitis    Family History  Problem Relation Age of Onset  . Stroke Mother   . Pulmonary embolism Father   . Diabetes Brother     BP 126/80   Pulse 98   Ht 5\' 10"  (1.778 m)   Wt 181 lb (82.1 kg)   SpO2 98%   BMI 25.97 kg/m    Review of Systems Denies nausea.      Objective:   Physical Exam VITAL SIGNS:  See vs page GENERAL: no distress Pulses: dorsalis pedis intact bilat.   MSK: no deformity  of the feet CV: no leg edema Skin:  no ulcer on the feet.  normal color and temp on the feet.  Neuro: sensation is intact to touch on the feet.     Lab Results  Component Value Date   CREATININE 0.89 07/12/2019   BUN  15 07/12/2019   NA 137 07/12/2019   K 3.9 07/12/2019   CL 101 07/12/2019   CO2 27 07/12/2019        Assessment & Plan:  Type 2 DM: well-controlled.    Patient Instructions  Please continue the same medications.  check your blood sugar once a day.  vary the time of day when you check, between before the 3 meals, and at bedtime.  also check if you have symptoms of your blood sugar being too high or too low.  please keep a record of the readings and bring it to your next appointment here (or you can bring the meter itself).  You can write it on any piece of paper.  please call us sooner if your blood sugar goes below 70, or if you have a lot of readings over 200. Please come back for a follow-up appointment in 4 months.

## 2019-09-04 NOTE — Patient Instructions (Addendum)
Please continue the same medications.    check your blood sugar once a day.  vary the time of day when you check, between before the 3 meals, and at bedtime.  also check if you have symptoms of your blood sugar being too high or too low.  please keep a record of the readings and bring it to your next appointment here (or you can bring the meter itself).  You can write it on any piece of paper.  please call us sooner if your blood sugar goes below 70, or if you have a lot of readings over 200.   Please come back for a follow-up appointment in 4 months.   

## 2019-09-10 DIAGNOSIS — N411 Chronic prostatitis: Secondary | ICD-10-CM | POA: Diagnosis not present

## 2019-10-16 ENCOUNTER — Other Ambulatory Visit: Payer: Self-pay | Admitting: Family Medicine

## 2019-10-21 ENCOUNTER — Other Ambulatory Visit: Payer: Self-pay | Admitting: Family Medicine

## 2019-10-21 ENCOUNTER — Encounter: Payer: Self-pay | Admitting: Family Medicine

## 2019-10-21 DIAGNOSIS — I10 Essential (primary) hypertension: Secondary | ICD-10-CM

## 2019-10-21 DIAGNOSIS — F411 Generalized anxiety disorder: Secondary | ICD-10-CM

## 2019-10-21 MED ORDER — CANDESARTAN CILEXETIL 16 MG PO TABS: 16.0000 mg | ORAL_TABLET | Freq: Every day | ORAL | 1 refills | Status: DC

## 2019-10-21 MED ORDER — ALPRAZOLAM 0.25 MG PO TABS: ORAL_TABLET | ORAL | 0 refills | Status: DC

## 2019-10-21 NOTE — Telephone Encounter (Signed)
Last written:  07/12/19 Last ov: 07/12/19 Next ov: none Contract: 07/12/19 UDS: 07/12/19

## 2019-10-21 NOTE — Telephone Encounter (Signed)
Duplicate request please deny this when other is filled.

## 2019-10-22 NOTE — Telephone Encounter (Signed)
Please deny atuo refill request since Xanax was refilled 10/21/19

## 2019-10-23 ENCOUNTER — Other Ambulatory Visit: Payer: Self-pay | Admitting: Family Medicine

## 2019-10-23 DIAGNOSIS — E781 Pure hyperglyceridemia: Secondary | ICD-10-CM

## 2019-10-27 ENCOUNTER — Encounter: Payer: Self-pay | Admitting: Family Medicine

## 2019-10-28 NOTE — Telephone Encounter (Signed)
6-8 months after original vaccine? Please advise

## 2019-10-28 NOTE — Telephone Encounter (Signed)
Correct

## 2019-11-18 DIAGNOSIS — R0981 Nasal congestion: Secondary | ICD-10-CM | POA: Diagnosis not present

## 2019-11-18 DIAGNOSIS — J018 Other acute sinusitis: Secondary | ICD-10-CM | POA: Diagnosis not present

## 2019-12-04 ENCOUNTER — Encounter: Payer: Self-pay | Admitting: Family Medicine

## 2019-12-12 ENCOUNTER — Encounter: Payer: Self-pay | Admitting: Family Medicine

## 2019-12-12 ENCOUNTER — Telehealth (INDEPENDENT_AMBULATORY_CARE_PROVIDER_SITE_OTHER): Payer: Medicare Other | Admitting: Family Medicine

## 2019-12-12 ENCOUNTER — Telehealth: Payer: Self-pay

## 2019-12-12 ENCOUNTER — Other Ambulatory Visit: Payer: Self-pay

## 2019-12-12 VITALS — BP 124/65 | HR 65

## 2019-12-12 DIAGNOSIS — J0140 Acute pansinusitis, unspecified: Secondary | ICD-10-CM

## 2019-12-12 MED ORDER — DOXYCYCLINE HYCLATE 100 MG PO TABS: 100.0000 mg | ORAL_TABLET | Freq: Two times a day (BID) | ORAL | 0 refills | Status: DC

## 2019-12-12 NOTE — Progress Notes (Signed)
Virtual Visit via Video Note  I connected with Bruce Romero on 12/12/19 at  3:10 PM EDT by a video enabled telemedicine application and verified that I am speaking with the correct person using two identifiers.  Location: Patient: home alone  Provider: office    I discussed the limitations of evaluation and management by telemedicine and the availability of in person appointments. The patient expressed understanding and agreed to proceed.  History of Present Illness: Pt is home c/o 5-6 days of congestion and sinus pressure / congestion ---- on 10/14 he was put on abx and it never completely cleared up -- covid neg  No fevers   , sob  ,  Some cough but not bad    Observations/Objective: Vitals:   12/12/19 1508  BP: 124/65  Pulse: 65  glucose-- 142 + frontal sinus pressure and R side max pressure and + r ear pressure   Assessment and Plan: 1. Acute non-recurrent pansinusitis con't flonase and antihistamine If no better ---since covid neg --- ok to come into office  - doxycycline (VIBRA-TABS) 100 MG tablet; Take 1 tablet (100 mg total) by mouth 2 (two) times daily.  Dispense: 20 tablet; Refill: 0   Follow Up Instructions:    I discussed the assessment and treatment plan with the patient. The patient was provided an opportunity to ask questions and all were answered. The patient agreed with the plan and demonstrated an understanding of the instructions.   The patient was advised to call back or seek an in-person evaluation if the symptoms worsen or if the condition fails to improve as anticipated.  I provided 25 minutes of non-face-to-face time during this encounter.   Ann Held, DO

## 2019-12-12 NOTE — Telephone Encounter (Signed)
Nurse Assessment Nurse: Harvie Bridge, RN, Beth Date/Time (Eastern Time): 12/12/2019 8:14:22 AM Confirm and document reason for call. If symptomatic, describe symptoms. ---Caller states he has a sinus infection and would like to be seen. He is having congestion, drainage, and pressure on the right side of his face. He is also having a cough and watery eyes. Denies fever. Does the patient have any new or worsening symptoms? ---Yes Will a triage be completed? ---Yes Related visit to physician within the last 2 weeks? ---No Does the PT have any chronic conditions? (i.e. diabetes, asthma, this includes High risk factors for pregnancy, etc.) ---Yes List chronic conditions. ---DM, HTN Is this a behavioral health or substance abuse call? ---No Guidelines Guideline Title Affirmed Question Affirmed Notes Nurse Date/Time Eilene Ghazi Time) Sinus Pain or Congestion Earache Newhart, RN, Beth 12/12/2019 8:16:59 AM Disp. Time Eilene Ghazi Time) Disposition Final User 12/12/2019 8:18:32 AM See PCP within 24 Hours Yes Newhart, RN, Eustaquio Maize Caller Disagree/Comply Comply Caller Understands Yes PLEASE NOTE: All timestamps contained within this report are represented as [place] Standard Time. CONFIDENTIALTY NOTICE: This fax transmission is intended only for the addressee. It contains information that is legally privileged, confidential or otherwise protected from use or disclosure. If you are not the intended recipient, you are strictly prohibited from reviewing, disclosing, copying using or disseminating any of this information or taking any action in reliance on or regarding this information. If you have received this fax in error, please notify us immediately by telephone so that we can arrange for its return to [place]. Phone: [phone], Toll-Free: [phone], Fax: [phone] Page: 2 of 2 Call Id: [id number] PreDisposition Did not know what to do Care Advice Given Per Guideline SEE PCP WITHIN 24 HOURS: * IF OFFICE WILL  BE OPEN: You need to be examined within the next 24 hours. Call your doctor (or NP/PA) when the office opens and make an appointment. PAIN MEDICINES: * ACETAMINOPHEN - REGULAR STRENGTH TYLENOL: Take 650 mg (two 325 mg pills) by mouth every 4 to 6 hours as needed. Each Regular Strength Tylenol pill has 325 mg of acetaminophen. The most you should take each day is 3,250 mg (10 pills a day). * IBUPROFEN (E.G., MOTRIN, ADVIL): Take 400 mg (two 200 mg pills) by mouth every 6 hours. The most you should take each day is 1,200 mg (six 200 mg pills), unless your doctor has told you to take more. NASAL WASHES FOR A STUFFY NOSE: * Introduction: Saline (salt water) nasal irrigation (nasal wash) is an effective and simple home remedy for treating stuffy nose and sinus congestion. The nose can be irrigated by pouring, spraying, or squirting salt water into the nose and then letting it run back out. CALL BACK IF: * Difficulty breathing (and not relieved by cleaning out nose) * You become worse CARE ADVICE given per Sinus Pain or Congestion (Adult) guideline. Referrals REFERRED TO PCP OFFICE  Appt scheduled today.

## 2019-12-17 DIAGNOSIS — L821 Other seborrheic keratosis: Secondary | ICD-10-CM | POA: Diagnosis not present

## 2019-12-17 DIAGNOSIS — D485 Neoplasm of uncertain behavior of skin: Secondary | ICD-10-CM | POA: Diagnosis not present

## 2019-12-17 DIAGNOSIS — D225 Melanocytic nevi of trunk: Secondary | ICD-10-CM | POA: Diagnosis not present

## 2019-12-17 DIAGNOSIS — L570 Actinic keratosis: Secondary | ICD-10-CM | POA: Diagnosis not present

## 2019-12-17 DIAGNOSIS — L814 Other melanin hyperpigmentation: Secondary | ICD-10-CM | POA: Diagnosis not present

## 2019-12-17 DIAGNOSIS — D1801 Hemangioma of skin and subcutaneous tissue: Secondary | ICD-10-CM | POA: Diagnosis not present

## 2020-01-06 ENCOUNTER — Other Ambulatory Visit: Payer: Self-pay | Admitting: Endocrinology

## 2020-01-09 ENCOUNTER — Ambulatory Visit: Payer: Medicare Other | Admitting: Endocrinology

## 2020-01-09 ENCOUNTER — Other Ambulatory Visit: Payer: Self-pay

## 2020-01-09 ENCOUNTER — Encounter: Payer: Self-pay | Admitting: Endocrinology

## 2020-01-09 VITALS — BP 124/60 | HR 74 | Ht 72.0 in | Wt 192.0 lb

## 2020-01-09 DIAGNOSIS — E119 Type 2 diabetes mellitus without complications: Secondary | ICD-10-CM | POA: Diagnosis not present

## 2020-01-09 LAB — POCT GLYCOSYLATED HEMOGLOBIN (HGB A1C): Hemoglobin A1C: 6.5 % — AB (ref 4.0–5.6)

## 2020-01-09 NOTE — Patient Instructions (Addendum)
Please continue the same 2 diabetes medications.  check your blood sugar once a day.  vary the time of day when you check, between before the 3 meals, and at bedtime.  also check if you have symptoms of your blood sugar being too high or too low.  please keep a record of the readings and bring it to your next appointment here (or you can bring the meter itself).  You can write it on any piece of paper.  please call us sooner if your blood sugar goes below 70, or if you have a lot of readings over 200. Please come back for a follow-up appointment in 5 months.

## 2020-01-09 NOTE — Progress Notes (Signed)
   Subjective:    Patient ID: Bruce Romero, male    DOB: 07-Oct-1945, 74 y.o.   MRN: [id number]  HPI Pt returns for f/u of diabetes mellitus:  DM type: 2 Dx'ed: 8338 Complications: PN. Therapy: trulicity and metformin.   DKA: never.  Severe hypoglycemia: never.   Pancreatitis: never.  Other: he has never been on insulin; he did not tolerate invokana (yeast infection).   Interval history:  pt states he feels well in general.  He says cbg's are mostly in the 100's.  He takes meds as rx'ed.     Review of Systems Denies n/v    Objective:   Physical Exam VITAL SIGNS:  See vs page GENERAL: no distress Pulses: dorsalis pedis intact bilat.   MSK: no deformity of the feet CV: no leg edema Skin:  no ulcer on the feet.  normal color and temp on the feet. Neuro: sensation is intact to touch on the feet   A1c=6.5%  Lab Results  Component Value Date   CREATININE 0.89 07/12/2019   BUN 15 07/12/2019   NA 137 07/12/2019   K 3.9 07/12/2019   CL 101 07/12/2019   CO2 27 07/12/2019       Assessment & Plan:  Type 2 DM, with PN: well-controlled  Patient Instructions  Please continue the same 2 diabetes medications.  check your blood sugar once a day.  vary the time of day when you check, between before the 3 meals, and at bedtime.  also check if you have symptoms of your blood sugar being too high or too low.  please keep a record of the readings and bring it to your next appointment here (or you can bring the meter itself).  You can write it on any piece of paper.  please call us sooner if your blood sugar goes below 70, or if you have a lot of readings over 200. Please come back for a follow-up appointment in 5 months.

## 2020-01-21 ENCOUNTER — Telehealth (INDEPENDENT_AMBULATORY_CARE_PROVIDER_SITE_OTHER): Payer: Medicare Other | Admitting: Family Medicine

## 2020-01-21 ENCOUNTER — Other Ambulatory Visit: Payer: Self-pay

## 2020-01-21 ENCOUNTER — Encounter: Payer: Self-pay | Admitting: Family Medicine

## 2020-01-21 VITALS — BP 122/64 | HR 70 | Temp 97.2°F | Ht 72.0 in | Wt 190.0 lb

## 2020-01-21 DIAGNOSIS — J324 Chronic pansinusitis: Secondary | ICD-10-CM

## 2020-01-21 DIAGNOSIS — J0140 Acute pansinusitis, unspecified: Secondary | ICD-10-CM

## 2020-01-21 MED ORDER — PREDNISONE 20 MG PO TABS: 40.0000 mg | ORAL_TABLET | Freq: Every day | ORAL | 0 refills | Status: DC

## 2020-01-21 MED ORDER — PREDNISONE 10 MG PO TABS: ORAL_TABLET | ORAL | 0 refills | Status: DC

## 2020-01-21 MED ORDER — AMOXICILLIN-POT CLAVULANATE 875-125 MG PO TABS: 1.0000 | ORAL_TABLET | Freq: Two times a day (BID) | ORAL | 0 refills | Status: DC

## 2020-01-21 NOTE — Progress Notes (Signed)
Virtual Visit via Video Note  I connected with Bruce Romero on 01/21/20 at 11:40 AM EST by a video enabled telemedicine application and verified that I am speaking with the correct person using two identifiers.  Location: Patient: home alone  Provider: office    I discussed the limitations of evaluation and management by telemedicine and the availability of in person appointments. The patient expressed understanding and agreed to proceed.  History of Present Illness: Pt is home c/o sinus pressure / congestion -- he took 5 days of doxy with no relief   No fevers , no cough He is taking flonase and allegra with little relief  No body aches     Observations/Objective: Vitals:   01/21/20 1133  BP: 122/64  Pulse: 70  Temp: (!) 97.2 F (36.2 C)  SpO2: 99%   bs 149 Pt is in NAD Assessment and Plan: 1. Acute non-recurrent pansinusitis abx and pred con't flonase and allegra  Call prn  - amoxicillin-clavulanate (AUGMENTIN) 875-125 MG tablet; Take 1 tablet by mouth 2 (two) times daily.  Dispense: 20 tablet; Refill: 0 - predniSONE (DELTASONE) 10 MG tablet; TAKE 3 TABLETS PO QD FOR 3 DAYS THEN TAKE 2 TABLETS PO QD FOR 3 DAYS THEN TAKE 1 TABLET PO QD FOR 3 DAYS THEN TAKE 1/2 TAB PO QD FOR 3 DAYS  Dispense: 20 tablet; Refill: 0'  Follow Up Instructions:    I discussed the assessment and treatment plan with the patient. The patient was provided an opportunity to ask questions and all were answered. The patient agreed with the plan and demonstrated an understanding of the instructions.   The patient was advised to call back or seek an in-person evaluation if the symptoms worsen or if the condition fails to improve as anticipated.  I provided 25 minutes of non-face-to-face time during this encounter.   Ann Held, DO

## 2020-01-23 ENCOUNTER — Other Ambulatory Visit: Payer: Self-pay | Admitting: Family Medicine

## 2020-01-23 DIAGNOSIS — F411 Generalized anxiety disorder: Secondary | ICD-10-CM

## 2020-01-24 MED ORDER — ALPRAZOLAM 0.25 MG PO TABS
ORAL_TABLET | ORAL | 0 refills | Status: DC
Start: 2020-01-24 — End: 2020-06-01

## 2020-01-24 NOTE — Telephone Encounter (Signed)
Requesting: alprazolam 0.25mg  Contract: 07/12/2019 UDS: 07/12/2019 Last Visit: 01/21/2020 Next Visit: None Last Refill: 10/21/2019 #30 and 0RF  Please Advise

## 2020-02-05 ENCOUNTER — Other Ambulatory Visit: Payer: Self-pay | Admitting: Family Medicine

## 2020-02-11 ENCOUNTER — Encounter: Payer: Self-pay | Admitting: Family Medicine

## 2020-02-11 MED ORDER — DOXYCYCLINE HYCLATE 100 MG PO TABS: 100.0000 mg | ORAL_TABLET | Freq: Two times a day (BID) | ORAL | 0 refills | Status: DC

## 2020-02-11 NOTE — Telephone Encounter (Signed)
Call in doxycycline 100 mg bid x 10 days  Will need ov if no better

## 2020-02-26 ENCOUNTER — Encounter: Payer: Self-pay | Admitting: Family Medicine

## 2020-02-26 MED ORDER — AMLODIPINE BESYLATE 10 MG PO TABS: 10.0000 mg | ORAL_TABLET | Freq: Every day | ORAL | 1 refills | Status: DC

## 2020-03-11 ENCOUNTER — Telehealth (INDEPENDENT_AMBULATORY_CARE_PROVIDER_SITE_OTHER): Payer: Medicare Other | Admitting: Family Medicine

## 2020-03-11 ENCOUNTER — Other Ambulatory Visit: Payer: Self-pay

## 2020-03-11 ENCOUNTER — Encounter: Payer: Self-pay | Admitting: Family Medicine

## 2020-03-11 VITALS — BP 132/72 | HR 75 | Temp 96.9°F

## 2020-03-11 DIAGNOSIS — J302 Other seasonal allergic rhinitis: Secondary | ICD-10-CM

## 2020-03-11 DIAGNOSIS — H60331 Swimmer's ear, right ear: Secondary | ICD-10-CM

## 2020-03-11 MED ORDER — AZELASTINE HCL 0.1 % NA SOLN: 1.0000 | Freq: Two times a day (BID) | NASAL | 12 refills | Status: DC

## 2020-03-11 MED ORDER — OFLOXACIN 0.3 % OT SOLN: 10.0000 [drp] | Freq: Every day | OTIC | 0 refills | Status: DC

## 2020-03-11 NOTE — Progress Notes (Signed)
Virtual Visit via Video Note  I connected with [name] on 03/11/20 at 11:40 AM EST by a video enabled telemedicine application and verified that I am speaking with the correct person using two identifiers.  Location/ participants  Patient: home alone  Provider: home    I discussed the limitations of evaluation and management by telemedicine and the availability of in person appointments. The patient expressed understanding and agreed to proceed.  History of Present Illness: Pt is home c/o R ear feeling plugged---- his daughter flushed out a ton of wax over a week ago.   He was using debrox and colace    + congestion  He is using navage , steamer, flonase and allegra with liittle relief     Observations/Objective: Vitals:   03/11/20 1145  BP: 132/72  Pulse: 75  Temp: (!) 96.9 F (36.1 C)  SpO2: 98%  pt is in nad + pain with pushing on r ear    Assessment and Plan: 1. Acute swimmer's ear of right side floxin gtts  F/u in office if no improvement  - ofloxacin (FLOXIN OTIC) 0.3 % OTIC solution; Place 10 drops into the right ear daily.  Dispense: 5 mL; Refill: 0  2. Seasonal allergies con't flonase and he will start zyrtec tonight  Add astelin  F/u in office if no better  - azelastine (ASTELIN) 0.1 % nasal spray; Place 1 spray into both nostrils 2 (two) times daily. Use in each nostril as directed  Dispense: 30 mL; Refill: 12   Follow Up Instructions:    I discussed the assessment and treatment plan with the patient. The patient was provided an opportunity to ask questions and all were answered. The patient agreed with the plan and demonstrated an understanding of the instructions.   The patient was advised to call back or seek an in-person evaluation if the symptoms worsen or if the condition fails to improve as anticipated   Ann Held, DO

## 2020-04-29 ENCOUNTER — Other Ambulatory Visit: Payer: Self-pay | Admitting: Family Medicine

## 2020-04-29 DIAGNOSIS — K589 Irritable bowel syndrome without diarrhea: Secondary | ICD-10-CM

## 2020-05-06 LAB — HM DIABETES EYE EXAM

## 2020-05-26 ENCOUNTER — Encounter: Payer: Self-pay | Admitting: Family Medicine

## 2020-05-26 DIAGNOSIS — S46812A Strain of other muscles, fascia and tendons at shoulder and upper arm level, left arm, initial encounter: Secondary | ICD-10-CM | POA: Diagnosis not present

## 2020-05-26 DIAGNOSIS — E119 Type 2 diabetes mellitus without complications: Secondary | ICD-10-CM | POA: Diagnosis not present

## 2020-05-26 DIAGNOSIS — I10 Essential (primary) hypertension: Secondary | ICD-10-CM | POA: Diagnosis not present

## 2020-05-29 ENCOUNTER — Other Ambulatory Visit: Payer: Self-pay | Admitting: Endocrinology

## 2020-05-29 DIAGNOSIS — E119 Type 2 diabetes mellitus without complications: Secondary | ICD-10-CM

## 2020-06-01 ENCOUNTER — Other Ambulatory Visit: Payer: Self-pay | Admitting: Endocrinology

## 2020-06-01 ENCOUNTER — Other Ambulatory Visit: Payer: Self-pay | Admitting: Family Medicine

## 2020-06-01 ENCOUNTER — Other Ambulatory Visit: Payer: Self-pay

## 2020-06-01 DIAGNOSIS — E119 Type 2 diabetes mellitus without complications: Secondary | ICD-10-CM

## 2020-06-01 DIAGNOSIS — F411 Generalized anxiety disorder: Secondary | ICD-10-CM

## 2020-06-02 MED ORDER — ALPRAZOLAM 0.25 MG PO TABS: ORAL_TABLET | ORAL | 0 refills | Status: DC

## 2020-06-02 NOTE — Telephone Encounter (Signed)
Requesting: alprazolam Contract: 07/12/19 UDS: 07/12/19 Last Visit: 03/11/20 Next Visit: none Last Refill: 01/24/20  Please Advise

## 2020-06-10 ENCOUNTER — Other Ambulatory Visit: Payer: Self-pay

## 2020-06-10 ENCOUNTER — Ambulatory Visit: Payer: Medicare Other | Admitting: Endocrinology

## 2020-06-10 VITALS — BP 124/70 | HR 74 | Ht 72.0 in | Wt 193.6 lb

## 2020-06-10 DIAGNOSIS — E119 Type 2 diabetes mellitus without complications: Secondary | ICD-10-CM

## 2020-06-10 LAB — POCT GLYCOSYLATED HEMOGLOBIN (HGB A1C): Hemoglobin A1C: 7.5 % — AB (ref 4.0–5.6)

## 2020-06-10 MED ORDER — REPAGLINIDE 0.5 MG PO TABS: 0.5000 mg | ORAL_TABLET | Freq: Every day | ORAL | 3 refills | Status: DC

## 2020-06-10 NOTE — Patient Instructions (Addendum)
I have sent a prescription to your pharmacy, to add "repaglinide." Please continue the same other 2 diabetes medications.  check your blood sugar once a day.  vary the time of day when you check, between before the 3 meals, and at bedtime.  also check if you have symptoms of your blood sugar being too high or too low.  please keep a record of the readings and bring it to your next appointment here (or you can bring the meter itself).  You can write it on any piece of paper.  please call us sooner if your blood sugar goes below 70, or if you have a lot of readings over 200. Please come back for a follow-up appointment in 4 months.

## 2020-06-10 NOTE — Progress Notes (Signed)
Subjective:    Patient ID: Bruce Romero, male    DOB: 1945-05-12, 75 y.o.   MRN: [id number]  HPI Pt returns for f/u of diabetes mellitus:  DM type: 2 Dx'ed: 7564 Complications: PN. Therapy: trulicity and metformin.   DKA: never.  Severe hypoglycemia: never.   Pancreatitis: never.  Other: he has never been on insulin; he did not tolerate invokana (yeast infection).   Interval history:  pt states he feels well in general.  He says cbg's are in the mid-100's.  He takes meds as rx'ed.   Past Medical History:  Diagnosis Date  . Acute medial meniscal tear, left, initial encounter   . Anxiety   . Diabetes mellitus   . GERD (gastroesophageal reflux disease)   . Hyperlipidemia   . Hypertension   . Irritable bowel   . Spastic colon    anaphylaxis due to [place]    Past Surgical History:  Procedure Laterality Date  . CHOLECYSTECTOMY    . CHONDROPLASTY Left 01/15/2016   Procedure: CHONDROPLASTY;  Surgeon: Melrose Nakayama, MD;  Location: [facility];  Service: Orthopedics;  Laterality: Left;  . KNEE ARTHROSCOPY WITH MEDIAL MENISECTOMY Left 01/15/2016   Procedure: ARTHROSCOPY LEFT KNEE WITH PARTIAL MEDIAL MENISECTOMY;  Surgeon: Melrose Nakayama, MD;  Location: [facility];  Service: Orthopedics;  Laterality: Left;  . NASAL SINUS SURGERY      Social History   Socioeconomic History  . Marital status: Married    Spouse name: Not on file  . Number of children: Not on file  . Years of education: Not on file  . Highest education level: Not on file  Occupational History  . Occupation: retired  Tobacco Use  . Smoking status: Former Research scientist (life sciences)  . Smokeless tobacco: Never Used  Substance and Sexual Activity  . Alcohol use: Yes    Alcohol/week: 0.0 standard drinks    Comment: social  . Drug use: No  . Sexual activity: Yes  Other Topics Concern  . Not on file  Social History Narrative   Exercise try to everyday.   Social Determinants of Health    Financial Resource Strain: Not on file  Food Insecurity: Not on file  Transportation Needs: Not on file  Physical Activity: Not on file  Stress: Not on file  Social Connections: Not on file  Intimate Partner Violence: Not on file    Current Outpatient Medications on File Prior to Visit  Medication Sig Dispense Refill  . albuterol (PROAIR HFA) 108 (90 Base) MCG/ACT inhaler Inhale 2 puffs every 6 (six) hours as needed into the lungs for wheezing. 1 Inhaler 0  . ALPRAZolam (XANAX) 0.25 MG tablet take 1 tablet by mouth three times a day if needed 30 tablet 0  . amLODipine (NORVASC) 10 MG tablet Take 1 tablet (10 mg total) by mouth daily. 90 tablet 1  . atorvastatin (LIPITOR) 10 MG tablet Take 1 tablet (10 mg total) by mouth daily. 90 tablet 0  . azelastine (ASTELIN) 0.1 % nasal spray Place 1 spray into both nostrils 2 (two) times daily. Use in each nostril as directed 30 mL 12  . candesartan (ATACAND) 16 MG tablet Take 1 tablet (16 mg total) by mouth at bedtime. 90 tablet 1  . Cholecalciferol (VITAMIN D) 2000 UNITS tablet Take 2,000 Units by mouth daily.    Marland Kitchen co-enzyme Q-10 30 MG capsule Take 100 mg by mouth daily.    Marland Kitchen dicyclomine (BENTYL) 20 MG tablet TAKE 1 TABLET BY  MOUTH EVERY 6 HOURS 60 tablet 5  . esomeprazole (NEXIUM) 40 MG capsule Take 20 mg by mouth daily.    . fluticasone (FLONASE) 50 MCG/ACT nasal spray Place into both nostrils daily.    Marland Kitchen icosapent Ethyl (VASCEPA) 1 g capsule TAKE 2 CAPSULES(2 GRAMS) BY MOUTH TWICE DAILY 360 capsule 1  . Insulin Pen Needle 32G X 6 MM MISC Use as directed with insulin pen 30 each 3  . linaclotide (LINZESS) 290 MCG CAPS capsule Take 1 capsule (290 mcg total) by mouth daily before breakfast. 90 capsule 3  . meloxicam (MOBIC) 7.5 MG tablet Take 1 tablet (7.5 mg total) by mouth daily. 30 tablet 0  . metFORMIN (GLUCOPHAGE-XR) 750 MG 24 hr tablet Take 1 tablet (750 mg total) by mouth daily. 90 tablet 3  . niacin (SLO-NIACIN) 500 MG tablet Take 500 mg  by mouth at bedtime.    Marland Kitchen ofloxacin (FLOXIN OTIC) 0.3 % OTIC solution Place 10 drops into the right ear daily. 5 mL 0  . ONETOUCH VERIO test strip TEST ONCE DAILY 100 strip 12  . rOPINIRole (REQUIP) 0.25 MG tablet 1 po qhs x 3 days then increase to 2 po qhs 60 tablet 0  . tamsulosin (FLOMAX) 0.4 MG CAPS capsule Take 1 capsule (0.4 mg total) by mouth daily. 30 capsule 3  . TRULICITY 4.5 QI/2.9NL SOPN INJECT 4.5 MG INTO THE SKIN ONCE A WEEK 2 mL 2   No current facility-administered medications on file prior to visit.    Allergies  Allergen Reactions  . Cephalexin Anaphylaxis    REACTION: pt is not allergic to penicillin  . Bactrim [Sulfamethoxazole-Trimethoprim] Other (See Comments)    ? Took Bactrim and Flexeril at same time had lip swelling unsure which caused reaction.  . Bromocriptine Nausea And Vomiting  . Flexeril [Cyclobenzaprine] Swelling  . Invokana [Canagliflozin]     balanitis    Family History  Problem Relation Age of Onset  . Stroke Mother   . Pulmonary embolism Father   . Diabetes Brother     BP 124/70 (BP Location: Right Arm, Patient Position: Sitting, Cuff Size: Large)   Pulse 74   Ht 6' (1.829 m)   Wt 193 lb 9.6 oz (87.8 kg)   SpO2 98%   BMI 26.26 kg/m    Review of Systems     Objective:   Physical Exam VITAL SIGNS:  See vs page GENERAL: no distress Pulses: dorsalis pedis intact bilat.   MSK: no deformity of the feet CV: no leg edema Skin:  no ulcer on the feet.  normal color and temp on the feet.   Neuro: sensation is intact to touch on the feet.    Lab Results  Component Value Date   CREATININE 0.89 07/12/2019   BUN 15 07/12/2019   NA 137 07/12/2019   K 3.9 07/12/2019   CL 101 07/12/2019   CO2 27 07/12/2019   Lab Results  Component Value Date   HGBA1C 7.5 (A) 06/10/2020       Assessment & Plan:  Type 2 DM: uncontrolled.   Patient Instructions  I have sent a prescription to your pharmacy, to add "repaglinide." Please continue the  same other 2 diabetes medications.  check your blood sugar once a day.  vary the time of day when you check, between before the 3 meals, and at bedtime.  also check if you have symptoms of your blood sugar being too high or too low.  please keep a record of  the readings and bring it to your next appointment here (or you can bring the meter itself).  You can write it on any piece of paper.  please call us sooner if your blood sugar goes below 70, or if you have a lot of readings over 200. Please come back for a follow-up appointment in 4 months.

## 2020-06-15 DIAGNOSIS — L700 Acne vulgaris: Secondary | ICD-10-CM | POA: Diagnosis not present

## 2020-06-15 DIAGNOSIS — L570 Actinic keratosis: Secondary | ICD-10-CM | POA: Diagnosis not present

## 2020-06-15 DIAGNOSIS — L578 Other skin changes due to chronic exposure to nonionizing radiation: Secondary | ICD-10-CM | POA: Diagnosis not present

## 2020-08-11 ENCOUNTER — Other Ambulatory Visit: Payer: Self-pay | Admitting: Family Medicine

## 2020-08-11 DIAGNOSIS — E781 Pure hyperglyceridemia: Secondary | ICD-10-CM

## 2020-08-13 ENCOUNTER — Other Ambulatory Visit: Payer: Self-pay

## 2020-08-13 DIAGNOSIS — I10 Essential (primary) hypertension: Secondary | ICD-10-CM

## 2020-08-13 MED ORDER — CANDESARTAN CILEXETIL 16 MG PO TABS: 16.0000 mg | ORAL_TABLET | Freq: Every day | ORAL | 1 refills | Status: DC

## 2020-08-13 MED ORDER — AMLODIPINE BESYLATE 10 MG PO TABS: 10.0000 mg | ORAL_TABLET | Freq: Every day | ORAL | 1 refills | Status: DC

## 2020-08-14 ENCOUNTER — Other Ambulatory Visit: Payer: Self-pay | Admitting: Family Medicine

## 2020-08-14 DIAGNOSIS — E781 Pure hyperglyceridemia: Secondary | ICD-10-CM

## 2020-08-20 ENCOUNTER — Telehealth: Payer: Self-pay | Admitting: Endocrinology

## 2020-08-20 ENCOUNTER — Other Ambulatory Visit: Payer: Self-pay

## 2020-08-20 DIAGNOSIS — E119 Type 2 diabetes mellitus without complications: Secondary | ICD-10-CM

## 2020-08-20 MED ORDER — TRULICITY 4.5 MG/0.5ML ~~LOC~~ SOAJ: SUBCUTANEOUS | 2 refills | Status: DC

## 2020-08-20 NOTE — Telephone Encounter (Signed)
MEDICATION: Trulicity  PHARMACY:  [facility] on [address] in [place] - ph# [phone]  HAS THE PATIENT CONTACTED THEIR PHARMACY?  yes  IS THIS A 90 DAY SUPPLY : no  IS PATIENT OUT OF MEDICATION: yes  IF NOT; HOW MUCH IS LEFT: 0  LAST APPOINTMENT DATE: @5 /05/2020  NEXT APPOINTMENT DATE:@9 /08/2020  DO WE HAVE YOUR PERMISSION TO LEAVE A DETAILED MESSAGE?: yes

## 2020-09-03 DIAGNOSIS — R350 Frequency of micturition: Secondary | ICD-10-CM | POA: Diagnosis not present

## 2020-09-04 ENCOUNTER — Other Ambulatory Visit: Payer: Self-pay | Admitting: Family Medicine

## 2020-09-04 ENCOUNTER — Encounter: Payer: Self-pay | Admitting: Family Medicine

## 2020-09-04 MED ORDER — LINACLOTIDE 145 MCG PO CAPS: 145.0000 ug | ORAL_CAPSULE | Freq: Every day | ORAL | 3 refills | Status: DC

## 2020-09-06 ENCOUNTER — Other Ambulatory Visit: Payer: Self-pay

## 2020-09-06 DIAGNOSIS — F411 Generalized anxiety disorder: Secondary | ICD-10-CM

## 2020-09-07 ENCOUNTER — Other Ambulatory Visit: Payer: Self-pay | Admitting: Family Medicine

## 2020-09-07 DIAGNOSIS — E1169 Type 2 diabetes mellitus with other specified complication: Secondary | ICD-10-CM

## 2020-09-07 DIAGNOSIS — E1165 Type 2 diabetes mellitus with hyperglycemia: Secondary | ICD-10-CM

## 2020-09-07 DIAGNOSIS — E559 Vitamin D deficiency, unspecified: Secondary | ICD-10-CM

## 2020-09-07 DIAGNOSIS — I10 Essential (primary) hypertension: Secondary | ICD-10-CM

## 2020-09-07 MED ORDER — ALPRAZOLAM 0.25 MG PO TABS: ORAL_TABLET | ORAL | 0 refills | Status: DC

## 2020-09-07 NOTE — Telephone Encounter (Signed)
Requesting: alprazolam Contract: 07/12/19 UDS: 07/12/19 Last Visit: last general f/u with you was 07/12/19 Next Visit: 09/14/20 Last Refill: 06/02/20  Please Advise

## 2020-09-08 ENCOUNTER — Telehealth: Payer: Self-pay | Admitting: Endocrinology

## 2020-09-08 DIAGNOSIS — E119 Type 2 diabetes mellitus without complications: Secondary | ICD-10-CM

## 2020-09-09 MED ORDER — METFORMIN HCL ER 750 MG PO TB24: 750.0000 mg | ORAL_TABLET | Freq: Every day | ORAL | 3 refills | Status: DC

## 2020-09-09 NOTE — Telephone Encounter (Signed)
Please refill Metformin to [facility] on [address]   Patient went to pick up and was told that it was denied because he needs an appointment.  He last visit was 06/10/20 and his regularly scheduled follow up is already scheduled for 10/14/20.

## 2020-09-09 NOTE — Telephone Encounter (Signed)
Rx has been sent to preferred pharmacy.  

## 2020-09-14 ENCOUNTER — Other Ambulatory Visit (INDEPENDENT_AMBULATORY_CARE_PROVIDER_SITE_OTHER): Payer: Medicare Other

## 2020-09-14 ENCOUNTER — Encounter: Payer: Self-pay | Admitting: Family Medicine

## 2020-09-14 ENCOUNTER — Other Ambulatory Visit: Payer: Self-pay

## 2020-09-14 ENCOUNTER — Telehealth (INDEPENDENT_AMBULATORY_CARE_PROVIDER_SITE_OTHER): Payer: Medicare Other | Admitting: Family Medicine

## 2020-09-14 VITALS — BP 133/68 | HR 68 | Temp 97.2°F | Wt 194.0 lb

## 2020-09-14 DIAGNOSIS — E1169 Type 2 diabetes mellitus with other specified complication: Secondary | ICD-10-CM

## 2020-09-14 DIAGNOSIS — E1165 Type 2 diabetes mellitus with hyperglycemia: Secondary | ICD-10-CM

## 2020-09-14 DIAGNOSIS — E785 Hyperlipidemia, unspecified: Secondary | ICD-10-CM

## 2020-09-14 DIAGNOSIS — E119 Type 2 diabetes mellitus without complications: Secondary | ICD-10-CM

## 2020-09-14 DIAGNOSIS — I10 Essential (primary) hypertension: Secondary | ICD-10-CM

## 2020-09-14 DIAGNOSIS — E559 Vitamin D deficiency, unspecified: Secondary | ICD-10-CM | POA: Diagnosis not present

## 2020-09-14 NOTE — Assessment & Plan Note (Signed)
Well controlled, no changes to meds. Encouraged heart healthy diet such as the DASH diet and exercise as tolerated.  °

## 2020-09-14 NOTE — Assessment & Plan Note (Addendum)
Per endo °

## 2020-09-14 NOTE — Assessment & Plan Note (Addendum)
Encourage heart healthy diet such as MIND or DASH diet, increase exercise, avoid trans fats, simple carbohydrates and processed foods, consider a krill or fish or flaxseed oil cap daily.  Check labs

## 2020-09-14 NOTE — Progress Notes (Signed)
MyChart Video Visit    Virtual Visit via Video Note   This visit type was conducted due to national recommendations for restrictions regarding the COVID-19 Pandemic (e.g. social distancing) in an effort to limit this patient's exposure and mitigate transmission in our community. This patient is at least at moderate risk for complications without adequate follow up. This format is felt to be most appropriate for this patient at this time. Physical exam was limited by quality of the video and audio technology used for the visit. Marin Roberts was able to get the patient set up on a video visit.  Patient location: Home Patient and provider in visit Provider location: Office  I discussed the limitations of evaluation and management by telemedicine and the availability of in person appointments. The patient expressed understanding and agreed to proceed  Visit Date: 09/14/2020  Today's healthcare provider: Ann Held, DO      Subjective:    Patient ID: Bruce Romero, male    DOB: August 21, 1945, 75 y.o.   MRN: [id number]  Chief Complaint  Patient presents with   Diabetes    Needs medications refill   Hypertension    Needs medications refill    HPI Patient is in today for a video visit to f/u bp and cholesterol  he will make a lab appointment  He sees endo for DM    no complaints   Past Medical History:  Diagnosis Date   Acute medial meniscal tear, left, initial encounter    Anxiety    Diabetes mellitus    GERD (gastroesophageal reflux disease)    Hyperlipidemia    Hypertension    Irritable bowel    Spastic colon    anaphylaxis due to [place]    Past Surgical History:  Procedure Laterality Date   CHOLECYSTECTOMY     CHONDROPLASTY Left 01/15/2016   Procedure: CHONDROPLASTY;  Surgeon: Melrose Nakayama, MD;  Location: [facility];  Service: Orthopedics;  Laterality: Left;   KNEE ARTHROSCOPY WITH MEDIAL MENISECTOMY Left 01/15/2016   Procedure:  ARTHROSCOPY LEFT KNEE WITH PARTIAL MEDIAL MENISECTOMY;  Surgeon: Melrose Nakayama, MD;  Location: [facility];  Service: Orthopedics;  Laterality: Left;   NASAL SINUS SURGERY      Family History  Problem Relation Age of Onset   Stroke Mother    Pulmonary embolism Father    Diabetes Brother     Social History   Socioeconomic History   Marital status: Married    Spouse name: Not on file   Number of children: Not on file   Years of education: Not on file   Highest education level: Not on file  Occupational History   Occupation: retired  Tobacco Use   Smoking status: Former   Smokeless tobacco: Never  Substance and Sexual Activity   Alcohol use: Yes    Alcohol/week: 0.0 standard drinks    Comment: social   Drug use: No   Sexual activity: Yes  Other Topics Concern   Not on file  Social History Narrative   Exercise try to everyday.   Social Determinants of Health   Financial Resource Strain: Not on file  Food Insecurity: Not on file  Transportation Needs: Not on file  Physical Activity: Not on file  Stress: Not on file  Social Connections: Not on file  Intimate Partner Violence: Not on file    Outpatient Medications Prior to Visit  Medication Sig Dispense Refill   albuterol (PROAIR HFA) 108 (90  Base) MCG/ACT inhaler Inhale 2 puffs every 6 (six) hours as needed into the lungs for wheezing. 1 Inhaler 0   ALPRAZolam (XANAX) 0.25 MG tablet take 1 tablet by mouth three times a day if needed 30 tablet 0   amLODipine (NORVASC) 10 MG tablet Take 1 tablet (10 mg total) by mouth daily. 90 tablet 1   atorvastatin (LIPITOR) 10 MG tablet TAKE 1 TABLET(10 MG) BY MOUTH DAILY 30 tablet 0   azelastine (ASTELIN) 0.1 % nasal spray Place 1 spray into both nostrils 2 (two) times daily. Use in each nostril as directed 30 mL 12   candesartan (ATACAND) 16 MG tablet Take 1 tablet (16 mg total) by mouth at bedtime. 90 tablet 1   Cholecalciferol (VITAMIN D) 2000 UNITS tablet Take  2,000 Units by mouth daily.     co-enzyme Q-10 30 MG capsule Take 100 mg by mouth daily.     dicyclomine (BENTYL) 20 MG tablet TAKE 1 TABLET BY MOUTH EVERY 6 HOURS 60 tablet 5   Dulaglutide (TRULICITY) 4.5 0000000 SOPN INJECT 4.5 MG INTO THE SKIN ONCE A WEEK 2 mL 2   esomeprazole (NEXIUM) 40 MG capsule Take 20 mg by mouth daily.     fluticasone (FLONASE) 50 MCG/ACT nasal spray Place into both nostrils daily.     icosapent Ethyl (VASCEPA) 1 g capsule TAKE 2 CAPSULES(2 GRAMS) BY MOUTH TWICE DAILY 120 capsule 0   Insulin Pen Needle 32G X 6 MM MISC Use as directed with insulin pen 30 each 3   linaclotide (LINZESS) 145 MCG CAPS capsule Take 1 capsule (145 mcg total) by mouth daily before breakfast. 90 capsule 3   linaclotide (LINZESS) 290 MCG CAPS capsule Take 1 capsule (290 mcg total) by mouth daily before breakfast. 90 capsule 3   meloxicam (MOBIC) 7.5 MG tablet Take 1 tablet (7.5 mg total) by mouth daily. 30 tablet 0   metFORMIN (GLUCOPHAGE-XR) 750 MG 24 hr tablet Take 1 tablet (750 mg total) by mouth daily. 90 tablet 3   niacin (SLO-NIACIN) 500 MG tablet Take 500 mg by mouth at bedtime.     ofloxacin (FLOXIN OTIC) 0.3 % OTIC solution Place 10 drops into the right ear daily. 5 mL 0   ONETOUCH VERIO test strip TEST ONCE DAILY 100 strip 12   repaglinide (PRANDIN) 0.5 MG tablet Take 1 tablet (0.5 mg total) by mouth daily with supper. 90 tablet 3   rOPINIRole (REQUIP) 0.25 MG tablet 1 po qhs x 3 days then increase to 2 po qhs 60 tablet 0   tamsulosin (FLOMAX) 0.4 MG CAPS capsule Take 1 capsule (0.4 mg total) by mouth daily. 30 capsule 3   No facility-administered medications prior to visit.    Allergies  Allergen Reactions   Cephalexin Anaphylaxis    REACTION: pt is not allergic to penicillin   Bactrim [Sulfamethoxazole-Trimethoprim] Other (See Comments)    ? Took Bactrim and Flexeril at same time had lip swelling unsure which caused reaction.   Bromocriptine Nausea And Vomiting   Flexeril  [Cyclobenzaprine] Swelling   Invokana [Canagliflozin]     balanitis    Review of Systems  Constitutional:  Negative for fever and malaise/fatigue.  HENT:  Negative for congestion.   Eyes:  Negative for blurred vision.  Respiratory:  Negative for shortness of breath.   Cardiovascular:  Negative for chest pain, palpitations and leg swelling.  Gastrointestinal:  Negative for abdominal pain, blood in stool and nausea.  Genitourinary:  Negative for dysuria and frequency.  Musculoskeletal:  Negative for falls.  Skin:  Negative for rash.  Neurological:  Negative for dizziness, loss of consciousness and headaches.  Endo/Heme/Allergies:  Negative for environmental allergies.  Psychiatric/Behavioral:  Negative for depression. The patient is not nervous/anxious.       Objective:    Physical Exam Vitals and nursing note reviewed.  Pulmonary:     Effort: Pulmonary effort is normal.  Psychiatric:        Mood and Affect: Mood normal.        Behavior: Behavior normal.        Thought Content: Thought content normal.        Judgment: Judgment normal.    BP 133/68   Pulse 68   Temp (!) 97.2 F (36.2 C)   Wt 194 lb (88 kg)   SpO2 98%   BMI 26.31 kg/m  Wt Readings from Last 3 Encounters:  09/14/20 194 lb (88 kg)  06/10/20 193 lb 9.6 oz (87.8 kg)  01/21/20 190 lb (86.2 kg)    Diabetic Foot Exam - Simple   No data filed    Lab Results  Component Value Date   WBC 8.3 08/08/2019   HGB 13.6 08/08/2019   HCT 40.0 08/08/2019   PLT 215.0 08/08/2019   GLUCOSE 142 (H) 07/12/2019   CHOL 122 07/12/2019   TRIG 117.0 07/12/2019   HDL 55.60 07/12/2019   LDLDIRECT 74.0 03/30/2018   LDLCALC 43 07/12/2019   ALT 23 07/12/2019   AST 26 07/12/2019   NA 137 07/12/2019   K 3.9 07/12/2019   CL 101 07/12/2019   CREATININE 0.89 07/12/2019   BUN 15 07/12/2019   CO2 27 07/12/2019   TSH 2.28 09/04/2019   PSA 0.36 08/08/2019   HGBA1C 7.5 (A) 06/10/2020   MICROALBUR <0.7 06/27/2016    Lab  Results  Component Value Date   TSH 2.28 09/04/2019   Lab Results  Component Value Date   WBC 8.3 08/08/2019   HGB 13.6 08/08/2019   HCT 40.0 08/08/2019   MCV 87.1 08/08/2019   PLT 215.0 08/08/2019   Lab Results  Component Value Date   NA 137 07/12/2019   K 3.9 07/12/2019   CO2 27 07/12/2019   GLUCOSE 142 (H) 07/12/2019   BUN 15 07/12/2019   CREATININE 0.89 07/12/2019   BILITOT 0.8 07/12/2019   ALKPHOS 50 07/12/2019   AST 26 07/12/2019   ALT 23 07/12/2019   PROT 6.7 07/12/2019   ALBUMIN 4.5 07/12/2019   CALCIUM 8.9 07/12/2019   GFR 83.34 07/12/2019   Lab Results  Component Value Date   CHOL 122 07/12/2019   Lab Results  Component Value Date   HDL 55.60 07/12/2019   Lab Results  Component Value Date   LDLCALC 43 07/12/2019   Lab Results  Component Value Date   TRIG 117.0 07/12/2019   Lab Results  Component Value Date   CHOLHDL 2 07/12/2019   Lab Results  Component Value Date   HGBA1C 7.5 (A) 06/10/2020       Assessment & Plan:   Problem List Items Addressed This Visit       Unprioritized   Uncontrolled type 2 diabetes mellitus with hyperglycemia ([place])   Diabetes ([place])    Per endo        Essential hypertension    Well controlled, no changes to meds. Encouraged heart healthy diet such as the DASH diet and exercise as tolerated.  Check labs today       ESSENTIAL HYPERTENSION,  BENIGN    Well controlled, no changes to meds. Encouraged heart healthy diet such as the DASH diet and exercise as tolerated.        Hyperlipidemia LDL goal <100    Encourage heart healthy diet such as MIND or DASH diet, increase exercise, avoid trans fats, simple carbohydrates and processed foods, consider a krill or fish or flaxseed oil cap daily.  Check labs        Other Visit Diagnoses     Hyperlipidemia associated with type 2 diabetes mellitus ([place])    -  Primary   Primary hypertension           No orders of the defined types were placed in this  encounter.   I discussed the assessment and treatment plan with the patient. The patient was provided an opportunity to ask questions and all were answered. The patient agreed with the plan and demonstrated an understanding of the instructions.   The patient was advised to call back or seek an in-person evaluation if the symptoms worsen or if the condition fails to improve as anticipated.  I provided 20 minutes of face-to-face time during this encounter.   I,Zite Okoli,acting as a Education administrator for [employer], DO.,have documented all relevant documentation on the behalf of Ann Held, DO,as directed by  Ann Held, DO while in the presence of [place], DO, have reviewed all documentation for this visit. The documentation on 09/14/20 for the exam, diagnosis, procedures, and orders are all accurate and complete.    Ann Held, DO [facility] [phone] (phone) [phone] (fax)  [place]

## 2020-09-14 NOTE — Assessment & Plan Note (Signed)
Well controlled, no changes to meds. Encouraged heart healthy diet such as the DASH diet and exercise as tolerated.  Check labs today

## 2020-09-15 LAB — COMPREHENSIVE METABOLIC PANEL
ALT: 18 U/L (ref 0–53)
AST: 22 U/L (ref 0–37)
Albumin: 4.1 g/dL (ref 3.5–5.2)
Alkaline Phosphatase: 52 U/L (ref 39–117)
BUN: 19 mg/dL (ref 6–23)
CO2: 28 mEq/L (ref 19–32)
Calcium: 8.7 mg/dL (ref 8.4–10.5)
Chloride: 103 mEq/L (ref 96–112)
Creatinine, Ser: 1.03 mg/dL (ref 0.40–1.50)
GFR: 70.75 mL/min (ref >60.00)
Glucose, Bld: 148 mg/dL — ABNORMAL HIGH (ref 70–99)
Potassium: 4.2 mEq/L (ref 3.5–5.1)
Sodium: 140 mEq/L (ref 135–145)
Total Bilirubin: 0.4 mg/dL (ref 0.2–1.2)
Total Protein: 6.6 g/dL (ref 6.0–8.3)

## 2020-09-15 LAB — HEMOGLOBIN A1C: Hgb A1c MFr Bld: 7.3 % — ABNORMAL HIGH (ref 4.6–6.5)

## 2020-09-15 LAB — LIPID PANEL
Cholesterol: 96 mg/dL (ref 0–200)
HDL: 40.5 mg/dL (ref >39.00)
LDL Cholesterol: 25 mg/dL (ref 0–99)
NonHDL: 55.99
Total CHOL/HDL Ratio: 2
Triglycerides: 156 mg/dL — ABNORMAL HIGH (ref 0.0–149.0)
VLDL: 31.2 mg/dL (ref 0.0–40.0)

## 2020-09-15 LAB — VITAMIN D 25 HYDROXY (VIT D DEFICIENCY, FRACTURES): VITD: 63.18 ng/mL (ref 30.00–100.00)

## 2020-09-23 ENCOUNTER — Encounter: Payer: Self-pay | Admitting: Family Medicine

## 2020-09-23 DIAGNOSIS — E781 Pure hyperglyceridemia: Secondary | ICD-10-CM

## 2020-09-24 MED ORDER — ATORVASTATIN CALCIUM 10 MG PO TABS: ORAL_TABLET | ORAL | 1 refills | Status: DC

## 2020-09-24 MED ORDER — ICOSAPENT ETHYL 1 G PO CAPS: ORAL_CAPSULE | ORAL | 1 refills | Status: DC

## 2020-10-02 DIAGNOSIS — M542 Cervicalgia: Secondary | ICD-10-CM | POA: Diagnosis not present

## 2020-10-14 ENCOUNTER — Ambulatory Visit (INDEPENDENT_AMBULATORY_CARE_PROVIDER_SITE_OTHER): Payer: Medicare Other | Admitting: Endocrinology

## 2020-10-14 ENCOUNTER — Other Ambulatory Visit: Payer: Self-pay

## 2020-10-14 VITALS — BP 120/80 | HR 70 | Ht 72.0 in | Wt 198.0 lb

## 2020-10-14 DIAGNOSIS — E119 Type 2 diabetes mellitus without complications: Secondary | ICD-10-CM | POA: Diagnosis not present

## 2020-10-14 LAB — POCT GLYCOSYLATED HEMOGLOBIN (HGB A1C): Hemoglobin A1C: 7 % — AB (ref 4.0–5.6)

## 2020-10-14 NOTE — Progress Notes (Signed)
Subjective:    Patient ID: Bruce Romero, male    DOB: 06-16-1945, 75 y.o.   MRN: [id number]  HPI Pt returns for f/u of diabetes mellitus:  DM type: 2 Dx'ed: 123456 Complications: PN. Therapy: trulicity and 2 oral meds.  DKA: never.  Severe hypoglycemia: never.   Pancreatitis: never.  Other: he has never been on insulin; he did not tolerate invokana (yeast infection).   Interval history:  pt states mild nausea and heartburn.  He says cbg's are in the 100's.  He takes meds as rx'ed. Past Medical History:  Diagnosis Date   Acute medial meniscal tear, left, initial encounter    Anxiety    Diabetes mellitus    GERD (gastroesophageal reflux disease)    Hyperlipidemia    Hypertension    Irritable bowel    Spastic colon    anaphylaxis due to [place]    Past Surgical History:  Procedure Laterality Date   CHOLECYSTECTOMY     CHONDROPLASTY Left 01/15/2016   Procedure: CHONDROPLASTY;  Surgeon: Melrose Nakayama, MD;  Location: [place];  Service: Orthopedics;  Laterality: Left;   KNEE ARTHROSCOPY WITH MEDIAL MENISECTOMY Left 01/15/2016   Procedure: ARTHROSCOPY LEFT KNEE WITH PARTIAL MEDIAL MENISECTOMY;  Surgeon: Melrose Nakayama, MD;  Location: [place];  Service: Orthopedics;  Laterality: Left;   NASAL SINUS SURGERY      Social History   Socioeconomic History   Marital status: Married    Spouse name: Not on file   Number of children: Not on file   Years of education: Not on file   Highest education level: Not on file  Occupational History   Occupation: retired  Tobacco Use   Smoking status: Former   Smokeless tobacco: Never  Substance and Sexual Activity   Alcohol use: Yes    Alcohol/week: 0.0 standard drinks    Comment: social   Drug use: No   Sexual activity: Yes  Other Topics Concern   Not on file  Social History Narrative   Exercise try to everyday.   Social Determinants of Health   Financial Resource Strain: Not on file   Food Insecurity: Not on file  Transportation Needs: Not on file  Physical Activity: Not on file  Stress: Not on file  Social Connections: Not on file  Intimate Partner Violence: Not on file    Current Outpatient Medications on File Prior to Visit  Medication Sig Dispense Refill   albuterol (PROAIR HFA) 108 (90 Base) MCG/ACT inhaler Inhale 2 puffs every 6 (six) hours as needed into the lungs for wheezing. 1 Inhaler 0   ALPRAZolam (XANAX) 0.25 MG tablet take 1 tablet by mouth three times a day if needed 30 tablet 0   amLODipine (NORVASC) 10 MG tablet Take 1 tablet (10 mg total) by mouth daily. 90 tablet 1   atorvastatin (LIPITOR) 10 MG tablet TAKE 1 TABLET(10 MG) BY MOUTH DAILY 90 tablet 1   azelastine (ASTELIN) 0.1 % nasal spray Place 1 spray into both nostrils 2 (two) times daily. Use in each nostril as directed 30 mL 12   candesartan (ATACAND) 16 MG tablet Take 1 tablet (16 mg total) by mouth at bedtime. 90 tablet 1   Cholecalciferol (VITAMIN D) 2000 UNITS tablet Take 2,000 Units by mouth daily.     co-enzyme Q-10 30 MG capsule Take 100 mg by mouth daily.     dicyclomine (BENTYL) 20 MG tablet TAKE 1 TABLET BY MOUTH EVERY 6 HOURS 60 tablet  5   Dulaglutide (TRULICITY) 4.5 0000000 SOPN INJECT 4.5 MG INTO THE SKIN ONCE A WEEK 2 mL 2   esomeprazole (NEXIUM) 40 MG capsule Take 20 mg by mouth daily.     fluticasone (FLONASE) 50 MCG/ACT nasal spray Place into both nostrils daily.     icosapent Ethyl (VASCEPA) 1 g capsule TAKE 2 CAPSULES(2 GRAMS) BY MOUTH TWICE DAILY 360 capsule 1   Insulin Pen Needle 32G X 6 MM MISC Use as directed with insulin pen 30 each 3   linaclotide (LINZESS) 145 MCG CAPS capsule Take 1 capsule (145 mcg total) by mouth daily before breakfast. 90 capsule 3   linaclotide (LINZESS) 290 MCG CAPS capsule Take 1 capsule (290 mcg total) by mouth daily before breakfast. 90 capsule 3   meloxicam (MOBIC) 7.5 MG tablet Take 1 tablet (7.5 mg total) by mouth daily. 30 tablet 0    metFORMIN (GLUCOPHAGE-XR) 750 MG 24 hr tablet Take 1 tablet (750 mg total) by mouth daily. 90 tablet 3   niacin (SLO-NIACIN) 500 MG tablet Take 500 mg by mouth at bedtime.     ofloxacin (FLOXIN OTIC) 0.3 % OTIC solution Place 10 drops into the right ear daily. 5 mL 0   ONETOUCH VERIO test strip TEST ONCE DAILY 100 strip 12   repaglinide (PRANDIN) 0.5 MG tablet Take 1 tablet (0.5 mg total) by mouth daily with supper. 90 tablet 3   rOPINIRole (REQUIP) 0.25 MG tablet 1 po qhs x 3 days then increase to 2 po qhs 60 tablet 0   tamsulosin (FLOMAX) 0.4 MG CAPS capsule Take 1 capsule (0.4 mg total) by mouth daily. 30 capsule 3   No current facility-administered medications on file prior to visit.    Allergies  Allergen Reactions   Cephalexin Anaphylaxis    REACTION: pt is not allergic to penicillin   Bactrim [Sulfamethoxazole-Trimethoprim] Other (See Comments)    ? Took Bactrim and Flexeril at same time had lip swelling unsure which caused reaction.   Bromocriptine Nausea And Vomiting   Flexeril [Cyclobenzaprine] Swelling   Invokana [Canagliflozin]     balanitis    Family History  Problem Relation Age of Onset   Stroke Mother    Pulmonary embolism Father    Diabetes Brother     BP 120/80 (BP Location: Right Arm, Patient Position: Sitting, Cuff Size: Normal)   Pulse 70   Ht 6' (1.829 m)   Wt 198 lb (89.8 kg)   SpO2 97%   BMI 26.85 kg/m    Review of Systems He denies hypoglycemia    Objective:   Physical Exam Pulses: dorsalis pedis intact bilat.   MSK: no deformity of the feet CV: no leg edema Skin:  no ulcer on the feet.  normal color and temp on the feet. Neuro: sensation is intact to touch on the feet  Lab Results  Component Value Date   CREATININE 1.03 09/14/2020   BUN 19 09/14/2020   NA 140 09/14/2020   K 4.2 09/14/2020   CL 103 09/14/2020   CO2 28 09/14/2020     Lab Results  Component Value Date   HGBA1C 7.0 (A) 10/14/2020      Assessment & Plan:  Type 2  DM: well-controlled Heartburn, due to Trulicity.  We discussed.  He would like to continue the same.    Patient Instructions  Please continue the same 3 diabetes medications.  Please let me know if the stomach symptoms are worse, so we can reduce the Trulicity.  check your blood sugar once a day.  vary the time of day when you check, between before the 3 meals, and at bedtime.  also check if you have symptoms of your blood sugar being too high or too low.  please keep a record of the readings and bring it to your next appointment here (or you can bring the meter itself).  You can write it on any piece of paper.  please call us sooner if your blood sugar goes below 70, or if you have a lot of readings over 200. Please come back for a follow-up appointment in January.

## 2020-10-14 NOTE — Patient Instructions (Signed)
Please continue the same 3 diabetes medications.  Please let me know if the stomach symptoms are worse, so we can reduce the Trulicity.   check your blood sugar once a day.  vary the time of day when you check, between before the 3 meals, and at bedtime.  also check if you have symptoms of your blood sugar being too high or too low.  please keep a record of the readings and bring it to your next appointment here (or you can bring the meter itself).  You can write it on any piece of paper.  please call us sooner if your blood sugar goes below 70, or if you have a lot of readings over 200. Please come back for a follow-up appointment in January.

## 2020-10-15 ENCOUNTER — Encounter: Payer: Self-pay | Admitting: Endocrinology

## 2020-10-20 ENCOUNTER — Other Ambulatory Visit: Payer: Self-pay | Admitting: Endocrinology

## 2020-10-20 MED ORDER — INSULIN LISPRO (1 UNIT DIAL) 100 UNIT/ML (KWIKPEN): 4.0000 [IU] | PEN_INJECTOR | SUBCUTANEOUS | 11 refills | Status: DC | PRN

## 2020-11-02 DIAGNOSIS — M6281 Muscle weakness (generalized): Secondary | ICD-10-CM | POA: Diagnosis not present

## 2020-11-02 DIAGNOSIS — S161XXD Strain of muscle, fascia and tendon at neck level, subsequent encounter: Secondary | ICD-10-CM | POA: Diagnosis not present

## 2020-11-02 DIAGNOSIS — M2569 Stiffness of other specified joint, not elsewhere classified: Secondary | ICD-10-CM | POA: Diagnosis not present

## 2020-11-12 DIAGNOSIS — E119 Type 2 diabetes mellitus without complications: Secondary | ICD-10-CM | POA: Diagnosis not present

## 2020-11-12 DIAGNOSIS — J329 Chronic sinusitis, unspecified: Secondary | ICD-10-CM | POA: Diagnosis not present

## 2020-11-12 DIAGNOSIS — I10 Essential (primary) hypertension: Secondary | ICD-10-CM | POA: Diagnosis not present

## 2020-11-20 ENCOUNTER — Other Ambulatory Visit (HOSPITAL_COMMUNITY): Payer: Self-pay

## 2020-11-20 ENCOUNTER — Other Ambulatory Visit: Payer: Self-pay

## 2020-11-20 ENCOUNTER — Telehealth: Payer: Self-pay | Admitting: Endocrinology

## 2020-11-20 DIAGNOSIS — E119 Type 2 diabetes mellitus without complications: Secondary | ICD-10-CM

## 2020-11-20 MED ORDER — TRULICITY 4.5 MG/0.5ML ~~LOC~~ SOAJ
SUBCUTANEOUS | 2 refills | Status: DC
  Filled 2020-11-20: qty 2, 28d supply, fill #0
  Filled 2020-12-21 (×2): qty 2, 28d supply, fill #1
  Filled 2021-01-29: qty 2, 28d supply, fill #2

## 2020-11-20 NOTE — Telephone Encounter (Signed)
Rx sent to . 

## 2020-11-20 NOTE — Telephone Encounter (Signed)
Patient called to advise that his Trulicity will need to be sent to the [facility] because his local pharmacy does not have any of the Trulicity 4.5 MG in stock and won't until mid-November.

## 2020-11-21 ENCOUNTER — Other Ambulatory Visit: Payer: Self-pay | Admitting: Family Medicine

## 2020-11-25 DIAGNOSIS — M6281 Muscle weakness (generalized): Secondary | ICD-10-CM | POA: Diagnosis not present

## 2020-11-25 DIAGNOSIS — S161XXD Strain of muscle, fascia and tendon at neck level, subsequent encounter: Secondary | ICD-10-CM | POA: Diagnosis not present

## 2020-11-25 DIAGNOSIS — M2569 Stiffness of other specified joint, not elsewhere classified: Secondary | ICD-10-CM | POA: Diagnosis not present

## 2020-12-21 ENCOUNTER — Other Ambulatory Visit: Payer: Self-pay

## 2020-12-21 ENCOUNTER — Telehealth (INDEPENDENT_AMBULATORY_CARE_PROVIDER_SITE_OTHER): Payer: Medicare Other | Admitting: Family Medicine

## 2020-12-21 ENCOUNTER — Other Ambulatory Visit (HOSPITAL_COMMUNITY): Payer: Self-pay

## 2020-12-21 ENCOUNTER — Encounter: Payer: Self-pay | Admitting: Family Medicine

## 2020-12-21 VITALS — Temp 97.9°F

## 2020-12-21 DIAGNOSIS — J0140 Acute pansinusitis, unspecified: Secondary | ICD-10-CM

## 2020-12-21 DIAGNOSIS — G2581 Restless legs syndrome: Secondary | ICD-10-CM | POA: Diagnosis not present

## 2020-12-21 DIAGNOSIS — J302 Other seasonal allergic rhinitis: Secondary | ICD-10-CM

## 2020-12-21 DIAGNOSIS — F411 Generalized anxiety disorder: Secondary | ICD-10-CM

## 2020-12-21 MED ORDER — AMOXICILLIN-POT CLAVULANATE 875-125 MG PO TABS: 1.0000 | ORAL_TABLET | Freq: Two times a day (BID) | ORAL | 0 refills | Status: DC

## 2020-12-21 MED ORDER — AZELASTINE HCL 0.1 % NA SOLN: 1.0000 | Freq: Two times a day (BID) | NASAL | 12 refills | Status: DC

## 2020-12-21 MED ORDER — ROPINIROLE HCL 0.25 MG PO TABS: ORAL_TABLET | ORAL | 3 refills | Status: DC

## 2020-12-21 MED ORDER — ALPRAZOLAM 0.25 MG PO TABS: ORAL_TABLET | ORAL | 0 refills | Status: DC

## 2020-12-21 NOTE — Assessment & Plan Note (Signed)
abx , astelin and flonase Call or rto if symptoms do not improve or worsen

## 2020-12-21 NOTE — Progress Notes (Signed)
MyChart Video Visit    Virtual Visit via Video Note   This visit type was conducted due to national recommendations for restrictions regarding the COVID-19 Pandemic (e.g. social distancing) in an effort to limit this patient's exposure and mitigate transmission in our community. This patient is at least at moderate risk for complications without adequate follow up. This format is felt to be most appropriate for this patient at this time. Physical exam was limited by quality of the video and audio technology used for the visit. Alinda Dooms was able to get the patient set up on a video visit.  Patient location: Home Patient and provider in visit Provider location: Office  I discussed the limitations of evaluation and management by telemedicine and the availability of in person appointments. The patient expressed understanding and agreed to proceed.  Visit Date: 12/21/2020  Today's healthcare provider: Ann Held, DO     Subjective:    Patient ID: Bruce Romero, male    DOB: 1945-11-17, 75 y.o.   MRN: [id number]  Chief Complaint  Patient presents with   Sinus Problem    HPI Patient is in today for a video visit.  He reports having a sinus infection for the past 10 days. Symptoms include watery, burning eyes, congestion, cough and sinus pressure under his eyes. He adds that these symptoms are worse in the morning and he has to use a humidifier. He has been managing his symptoms with Flonase, azelastine and ibuprofen. He denies fever, sore throat and chest pain. Temperature- 97.5 O2- 97   He will also like a refill on requip because he has not been able to go to sleep. He has been trying to keep his legs elevated at night.  He is requesting for a refill on 0.25 mg xanax.   Past Medical History:  Diagnosis Date   Acute medial meniscal tear, left, initial encounter    Anxiety    Diabetes mellitus    GERD (gastroesophageal reflux disease)    Hyperlipidemia     Hypertension    Irritable bowel    Spastic colon    anaphylaxis due to [place]    Past Surgical History:  Procedure Laterality Date   CHOLECYSTECTOMY     CHONDROPLASTY Left 01/15/2016   Procedure: CHONDROPLASTY;  Surgeon: Melrose Nakayama, MD;  Location: [place];  Service: Orthopedics;  Laterality: Left;   KNEE ARTHROSCOPY WITH MEDIAL MENISECTOMY Left 01/15/2016   Procedure: ARTHROSCOPY LEFT KNEE WITH PARTIAL MEDIAL MENISECTOMY;  Surgeon: Melrose Nakayama, MD;  Location: [place];  Service: Orthopedics;  Laterality: Left;   NASAL SINUS SURGERY      Family History  Problem Relation Age of Onset   Stroke Mother    Pulmonary embolism Father    Diabetes Brother     Social History   Socioeconomic History   Marital status: Married    Spouse name: Not on file   Number of children: Not on file   Years of education: Not on file   Highest education level: Not on file  Occupational History   Occupation: retired  Tobacco Use   Smoking status: Former   Smokeless tobacco: Never  Substance and Sexual Activity   Alcohol use: Yes    Alcohol/week: 0.0 standard drinks    Comment: social   Drug use: No   Sexual activity: Yes  Other Topics Concern   Not on file  Social History Narrative   Exercise try to everyday.  Social Determinants of Health   Financial Resource Strain: Not on file  Food Insecurity: Not on file  Transportation Needs: Not on file  Physical Activity: Not on file  Stress: Not on file  Social Connections: Not on file  Intimate Partner Violence: Not on file    Outpatient Medications Prior to Visit  Medication Sig Dispense Refill   albuterol (PROAIR HFA) 108 (90 Base) MCG/ACT inhaler Inhale 2 puffs every 6 (six) hours as needed into the lungs for wheezing. 1 Inhaler 0   amLODipine (NORVASC) 10 MG tablet TAKE 1 TABLET(10 MG) BY MOUTH DAILY 90 tablet 1   atorvastatin (LIPITOR) 10 MG tablet TAKE 1 TABLET(10 MG) BY MOUTH DAILY  90 tablet 1   candesartan (ATACAND) 16 MG tablet Take 1 tablet (16 mg total) by mouth at bedtime. 90 tablet 1   Cholecalciferol (VITAMIN D) 2000 UNITS tablet Take 2,000 Units by mouth daily.     co-enzyme Q-10 30 MG capsule Take 100 mg by mouth daily.     dicyclomine (BENTYL) 20 MG tablet TAKE 1 TABLET BY MOUTH EVERY 6 HOURS 60 tablet 5   Dulaglutide (TRULICITY) 4.5 NK/5.3ZJ SOPN INJECT 4.5 MG INTO THE SKIN ONCE A WEEK 2 mL 2   esomeprazole (NEXIUM) 40 MG capsule Take 20 mg by mouth daily.     fluticasone (FLONASE) 50 MCG/ACT nasal spray Place into both nostrils daily.     icosapent Ethyl (VASCEPA) 1 g capsule TAKE 2 CAPSULES(2 GRAMS) BY MOUTH TWICE DAILY 360 capsule 1   insulin lispro (HUMALOG KWIKPEN) 100 UNIT/ML KwikPen Inject 4-8 Units into the skin every 4 (four) hours as needed. 4 units for any blood sugar in the 200's.  8 units for any over 300.  And pen needles #100 15 mL 11   Insulin Pen Needle 32G X 6 MM MISC Use as directed with insulin pen 30 each 3   linaclotide (LINZESS) 145 MCG CAPS capsule Take 1 capsule (145 mcg total) by mouth daily before breakfast. 90 capsule 3   linaclotide (LINZESS) 290 MCG CAPS capsule Take 1 capsule (290 mcg total) by mouth daily before breakfast. 90 capsule 3   meloxicam (MOBIC) 7.5 MG tablet Take 1 tablet (7.5 mg total) by mouth daily. 30 tablet 0   metFORMIN (GLUCOPHAGE-XR) 750 MG 24 hr tablet Take 1 tablet (750 mg total) by mouth daily. 90 tablet 3   niacin (SLO-NIACIN) 500 MG tablet Take 500 mg by mouth at bedtime.     ofloxacin (FLOXIN OTIC) 0.3 % OTIC solution Place 10 drops into the right ear daily. 5 mL 0   ONETOUCH VERIO test strip TEST ONCE DAILY 100 strip 12   repaglinide (PRANDIN) 0.5 MG tablet Take 1 tablet (0.5 mg total) by mouth daily with supper. 90 tablet 3   tamsulosin (FLOMAX) 0.4 MG CAPS capsule Take 1 capsule (0.4 mg total) by mouth daily. 30 capsule 3   ALPRAZolam (XANAX) 0.25 MG tablet take 1 tablet by mouth three times a day if  needed 30 tablet 0   azelastine (ASTELIN) 0.1 % nasal spray Place 1 spray into both nostrils 2 (two) times daily. Use in each nostril as directed 30 mL 12   rOPINIRole (REQUIP) 0.25 MG tablet 1 po qhs x 3 days then increase to 2 po qhs 60 tablet 0   No facility-administered medications prior to visit.    Allergies  Allergen Reactions   Cephalexin Anaphylaxis    REACTION: pt is not allergic to penicillin   Bactrim [  Sulfamethoxazole-Trimethoprim] Other (See Comments)    ? Took Bactrim and Flexeril at same time had lip swelling unsure which caused reaction.   Bromocriptine Nausea And Vomiting   Flexeril [Cyclobenzaprine] Swelling   Invokana [Canagliflozin]     balanitis    Review of Systems  Constitutional:  Negative for chills, fever and malaise/fatigue.  HENT:  Positive for congestion and sinus pain. Negative for hearing loss and sore throat.        (+) watery, burning eyes (+) sinus pressure   Eyes:  Negative for discharge.  Respiratory:  Positive for cough. Negative for sputum production and shortness of breath.   Cardiovascular:  Negative for chest pain, palpitations and leg swelling.  Gastrointestinal:  Negative for abdominal pain, blood in stool, constipation, diarrhea, heartburn, nausea and vomiting.  Genitourinary:  Negative for dysuria, frequency, hematuria and urgency.  Musculoskeletal:  Negative for back pain, falls and myalgias.  Skin:  Negative for rash.  Neurological:  Negative for dizziness, sensory change, loss of consciousness, weakness and headaches.  Endo/Heme/Allergies:  Negative for environmental allergies. Does not bruise/bleed easily.  Psychiatric/Behavioral:  Negative for depression and suicidal ideas. The patient is not nervous/anxious and does not have insomnia.       Objective:    Physical Exam Vitals and nursing note reviewed.  Constitutional:      Appearance: Normal appearance. He is well-developed. He is not ill-appearing.  HENT:     Head:  Normocephalic and atraumatic.  Neck:     Thyroid: No thyromegaly.  Pulmonary:     Effort: Pulmonary effort is normal.  Neurological:     General: No focal deficit present.     Mental Status: He is alert and oriented to person, place, and time.  Psychiatric:        Mood and Affect: Mood normal.        Behavior: Behavior normal.        Thought Content: Thought content normal.        Judgment: Judgment normal.    Temp 97.9 F (36.6 C)   SpO2 97%  Wt Readings from Last 3 Encounters:  10/14/20 198 lb (89.8 kg)  09/14/20 194 lb (88 kg)  06/10/20 193 lb 9.6 oz (87.8 kg)    Diabetic Foot Exam - Simple   No data filed    Lab Results  Component Value Date   WBC 8.3 08/08/2019   HGB 13.6 08/08/2019   HCT 40.0 08/08/2019   PLT 215.0 08/08/2019   GLUCOSE 148 (H) 09/14/2020   CHOL 96 09/14/2020   TRIG 156.0 (H) 09/14/2020   HDL 40.50 09/14/2020   LDLDIRECT 74.0 03/30/2018   LDLCALC 25 09/14/2020   ALT 18 09/14/2020   AST 22 09/14/2020   NA 140 09/14/2020   K 4.2 09/14/2020   CL 103 09/14/2020   CREATININE 1.03 09/14/2020   BUN 19 09/14/2020   CO2 28 09/14/2020   TSH 2.28 09/04/2019   PSA 0.36 08/08/2019   HGBA1C 7.0 (A) 10/14/2020   MICROALBUR <0.7 06/27/2016    Lab Results  Component Value Date   TSH 2.28 09/04/2019   Lab Results  Component Value Date   WBC 8.3 08/08/2019   HGB 13.6 08/08/2019   HCT 40.0 08/08/2019   MCV 87.1 08/08/2019   PLT 215.0 08/08/2019   Lab Results  Component Value Date   NA 140 09/14/2020   K 4.2 09/14/2020   CO2 28 09/14/2020   GLUCOSE 148 (H) 09/14/2020   BUN 19 09/14/2020  CREATININE 1.03 09/14/2020   BILITOT 0.4 09/14/2020   ALKPHOS 52 09/14/2020   AST 22 09/14/2020   ALT 18 09/14/2020   PROT 6.6 09/14/2020   ALBUMIN 4.1 09/14/2020   CALCIUM 8.7 09/14/2020   GFR 70.75 09/14/2020   Lab Results  Component Value Date   CHOL 96 09/14/2020   Lab Results  Component Value Date   HDL 40.50 09/14/2020   Lab Results   Component Value Date   LDLCALC 25 09/14/2020   Lab Results  Component Value Date   TRIG 156.0 (H) 09/14/2020   Lab Results  Component Value Date   CHOLHDL 2 09/14/2020   Lab Results  Component Value Date   HGBA1C 7.0 (A) 10/14/2020       Assessment & Plan:   Problem List Items Addressed This Visit       Unprioritized   RLS (restless legs syndrome)    Refill requip Pt instructed how to take it rto prn      Relevant Medications   rOPINIRole (REQUIP) 0.25 MG tablet   Sinusitis, acute - Primary    abx , astelin and flonase Call or rto if symptoms do not improve or worsen      Relevant Medications   amoxicillin-clavulanate (AUGMENTIN) 875-125 MG tablet   azelastine (ASTELIN) 0.1 % nasal spray   Other Visit Diagnoses     Seasonal allergies       Relevant Medications   azelastine (ASTELIN) 0.1 % nasal spray   Generalized anxiety disorder       Relevant Medications   ALPRAZolam (XANAX) 0.25 MG tablet        Meds ordered this encounter  Medications   amoxicillin-clavulanate (AUGMENTIN) 875-125 MG tablet    Sig: Take 1 tablet by mouth 2 (two) times daily.    Dispense:  20 tablet    Refill:  0   azelastine (ASTELIN) 0.1 % nasal spray    Sig: Place 1 spray into both nostrils 2 (two) times daily. Use in each nostril as directed    Dispense:  30 mL    Refill:  12   rOPINIRole (REQUIP) 0.25 MG tablet    Sig: 1 po qhs x 3 days then increase to 2 po qhs    Dispense:  60 tablet    Refill:  3   ALPRAZolam (XANAX) 0.25 MG tablet    Sig: take 1 tablet by mouth three times a day if needed    Dispense:  30 tablet    Refill:  0    I discussed the assessment and treatment plan with the patient. The patient was provided an opportunity to ask questions and all were answered. The patient agreed with the plan and demonstrated an understanding of the instructions.   The patient was advised to call back or seek an in-person evaluation if the symptoms worsen or if the  condition fails to improve as anticipated.  I provided 20 minutes of face-to-face time during this encounter.   I,Zite Okoli,acting as a Education administrator for [employer], DO.,have documented all relevant documentation on the behalf of Ann Held, DO,as directed by  Ann Held, DO while in the presence of [place], DO, have reviewed all documentation for this visit. The documentation on 12/21/20 for the exam, diagnosis, procedures, and orders are all accurate and complete.    Ann Held, DO [place] [facility] [employer]  [place] [phone] (phone) [phone] (fax)  [place]

## 2020-12-21 NOTE — Assessment & Plan Note (Signed)
Refill requip Pt instructed how to take it rto prn

## 2020-12-22 DIAGNOSIS — S161XXD Strain of muscle, fascia and tendon at neck level, subsequent encounter: Secondary | ICD-10-CM | POA: Diagnosis not present

## 2020-12-22 DIAGNOSIS — M6281 Muscle weakness (generalized): Secondary | ICD-10-CM | POA: Diagnosis not present

## 2020-12-22 DIAGNOSIS — M2569 Stiffness of other specified joint, not elsewhere classified: Secondary | ICD-10-CM | POA: Diagnosis not present

## 2021-01-13 DIAGNOSIS — M542 Cervicalgia: Secondary | ICD-10-CM | POA: Diagnosis not present

## 2021-01-15 DIAGNOSIS — M6281 Muscle weakness (generalized): Secondary | ICD-10-CM | POA: Diagnosis not present

## 2021-01-15 DIAGNOSIS — M2569 Stiffness of other specified joint, not elsewhere classified: Secondary | ICD-10-CM | POA: Diagnosis not present

## 2021-01-15 DIAGNOSIS — S161XXD Strain of muscle, fascia and tendon at neck level, subsequent encounter: Secondary | ICD-10-CM | POA: Diagnosis not present

## 2021-01-18 DIAGNOSIS — M2569 Stiffness of other specified joint, not elsewhere classified: Secondary | ICD-10-CM | POA: Diagnosis not present

## 2021-01-18 DIAGNOSIS — S161XXD Strain of muscle, fascia and tendon at neck level, subsequent encounter: Secondary | ICD-10-CM | POA: Diagnosis not present

## 2021-01-18 DIAGNOSIS — M6281 Muscle weakness (generalized): Secondary | ICD-10-CM | POA: Diagnosis not present

## 2021-01-27 DIAGNOSIS — M6281 Muscle weakness (generalized): Secondary | ICD-10-CM | POA: Diagnosis not present

## 2021-01-27 DIAGNOSIS — M2569 Stiffness of other specified joint, not elsewhere classified: Secondary | ICD-10-CM | POA: Diagnosis not present

## 2021-01-27 DIAGNOSIS — S161XXD Strain of muscle, fascia and tendon at neck level, subsequent encounter: Secondary | ICD-10-CM | POA: Diagnosis not present

## 2021-01-29 ENCOUNTER — Other Ambulatory Visit (HOSPITAL_COMMUNITY): Payer: Self-pay

## 2021-01-29 DIAGNOSIS — M2569 Stiffness of other specified joint, not elsewhere classified: Secondary | ICD-10-CM | POA: Diagnosis not present

## 2021-01-29 DIAGNOSIS — S161XXD Strain of muscle, fascia and tendon at neck level, subsequent encounter: Secondary | ICD-10-CM | POA: Diagnosis not present

## 2021-01-29 DIAGNOSIS — M6281 Muscle weakness (generalized): Secondary | ICD-10-CM | POA: Diagnosis not present

## 2021-02-15 ENCOUNTER — Other Ambulatory Visit: Payer: Self-pay

## 2021-02-15 ENCOUNTER — Telehealth (INDEPENDENT_AMBULATORY_CARE_PROVIDER_SITE_OTHER): Payer: Medicare Other | Admitting: Family

## 2021-02-15 ENCOUNTER — Telehealth: Payer: Self-pay | Admitting: Family Medicine

## 2021-02-15 ENCOUNTER — Encounter: Payer: Self-pay | Admitting: Family

## 2021-02-15 VITALS — BP 138/73 | Temp 97.2°F | Ht 72.0 in | Wt 198.2 lb

## 2021-02-15 DIAGNOSIS — J069 Acute upper respiratory infection, unspecified: Secondary | ICD-10-CM

## 2021-02-15 MED ORDER — ALBUTEROL SULFATE HFA 108 (90 BASE) MCG/ACT IN AERS: 2.0000 | INHALATION_SPRAY | Freq: Four times a day (QID) | RESPIRATORY_TRACT | 0 refills | Status: DC | PRN

## 2021-02-15 NOTE — Assessment & Plan Note (Signed)
pt reports sx for 2 weeks, taking pataday qd, Flonase and Azelastin bid, Mucinex. Advised to increase Pataday to bid. Increase water intake to 2liters/day, sending Albuterol inhaler, pt reports using in past. Advised to use at least bid, can use q6h.

## 2021-02-15 NOTE — Progress Notes (Signed)
MyChart Video Visit    Virtual Visit via Video Note   This visit type was conducted due to national recommendations for restrictions regarding the COVID-19 Pandemic (e.g. social distancing) in an effort to limit this patient's exposure and mitigate transmission in our community. This patient is at least at moderate risk for complications without adequate follow up. This format is felt to be most appropriate for this patient at this time. Physical exam was limited by quality of the video and audio technology used for the visit. CMA was able to get the patient set up on a video visit.  Patient location: Home. Patient and provider in visit Provider location: Office  I discussed the limitations of evaluation and management by telemedicine and the availability of in person appointments. The patient expressed understanding and agreed to proceed.  Visit Date: 02/15/2021  Today's healthcare provider: Jeanie Sewer, NP     Subjective:    Patient ID: Bruce Romero, male    DOB: [birth year], 76 y.o.   MRN: [id number]  Chief Complaint  Patient presents with   Nasal Congestion    All symptoms started 2 weeks ago.    Eye Itching    Bilateral eyes; More issues with Right eye. He has been using hot compresses and baby shampoo.    Ear Fullness    HPI Sinusitis: Patient complains of congestion, nasal congestion, and productive cough with  white colored sputum, and eye irritation, right ear fullness, with no fever, chills, night sweats or weight loss. Onset of symptoms was 2 weeks ago, unchanged since that time. He is drinking moderate amounts of fluids.  Past history is significant for no history of pneumonia or bronchitis. Patient is non-smoker.  Eye irritation:  Pt complains of itching, watery, redness yesterday, none today. Pt denies pain, discharge, matting. Symptoms started 7 days ago. Symptoms are located in both eyes, right > left. Pt does also have other sinus symptoms.      Past Medical History:  Diagnosis Date   Acute medial meniscal tear, left, initial encounter    Anxiety    Diabetes mellitus    GERD (gastroesophageal reflux disease)    Hyperlipidemia    Hypertension    Irritable bowel    Spastic colon    anaphylaxis due to [place]    Past Surgical History:  Procedure Laterality Date   CHOLECYSTECTOMY     CHONDROPLASTY Left 01/15/2016   Procedure: CHONDROPLASTY;  Surgeon: Melrose Nakayama, MD;  Location: [facility];  Service: Orthopedics;  Laterality: Left;   KNEE ARTHROSCOPY WITH MEDIAL MENISECTOMY Left 01/15/2016   Procedure: ARTHROSCOPY LEFT KNEE WITH PARTIAL MEDIAL MENISECTOMY;  Surgeon: Melrose Nakayama, MD;  Location: [facility];  Service: Orthopedics;  Laterality: Left;   NASAL SINUS SURGERY      Outpatient Medications Prior to Visit  Medication Sig Dispense Refill   ALPRAZolam (XANAX) 0.25 MG tablet take 1 tablet by mouth three times a day if needed 30 tablet 0   amLODipine (NORVASC) 10 MG tablet TAKE 1 TABLET(10 MG) BY MOUTH DAILY 90 tablet 1   atorvastatin (LIPITOR) 10 MG tablet TAKE 1 TABLET(10 MG) BY MOUTH DAILY 90 tablet 1   azelastine (ASTELIN) 0.1 % nasal spray Place 1 spray into both nostrils 2 (two) times daily. Use in each nostril as directed 30 mL 12   candesartan (ATACAND) 16 MG tablet Take 1 tablet (16 mg total) by mouth at bedtime. 90 tablet 1   Cholecalciferol (VITAMIN D) 2000  UNITS tablet Take 2,000 Units by mouth daily.     co-enzyme Q-10 30 MG capsule Take 100 mg by mouth daily.     Dulaglutide (TRULICITY) 4.5 GX/2.1JH SOPN INJECT 4.5 MG INTO THE SKIN ONCE A WEEK 2 mL 2   esomeprazole (NEXIUM) 40 MG capsule Take 20 mg by mouth daily.     fluticasone (FLONASE) 50 MCG/ACT nasal spray Place into both nostrils daily.     icosapent Ethyl (VASCEPA) 1 g capsule TAKE 2 CAPSULES(2 GRAMS) BY MOUTH TWICE DAILY 360 capsule 1   insulin lispro (HUMALOG KWIKPEN) 100 UNIT/ML KwikPen Inject 4-8 Units  into the skin every 4 (four) hours as needed. 4 units for any blood sugar in the 200's.  8 units for any over 300.  And pen needles #100 15 mL 11   Insulin Pen Needle 32G X 6 MM MISC Use as directed with insulin pen 30 each 3   linaclotide (LINZESS) 145 MCG CAPS capsule Take 1 capsule (145 mcg total) by mouth daily before breakfast. 90 capsule 3   linaclotide (LINZESS) 290 MCG CAPS capsule Take 1 capsule (290 mcg total) by mouth daily before breakfast. 90 capsule 3   meloxicam (MOBIC) 7.5 MG tablet Take 1 tablet (7.5 mg total) by mouth daily. 30 tablet 0   metFORMIN (GLUCOPHAGE-XR) 750 MG 24 hr tablet Take 1 tablet (750 mg total) by mouth daily. 90 tablet 3   niacin (SLO-NIACIN) 500 MG tablet Take 500 mg by mouth at bedtime.     ofloxacin (FLOXIN OTIC) 0.3 % OTIC solution Place 10 drops into the right ear daily. 5 mL 0   ONETOUCH VERIO test strip TEST ONCE DAILY 100 strip 12   repaglinide (PRANDIN) 0.5 MG tablet Take 1 tablet (0.5 mg total) by mouth daily with supper. 90 tablet 3   rOPINIRole (REQUIP) 0.25 MG tablet 1 po qhs x 3 days then increase to 2 po qhs 60 tablet 3   tamsulosin (FLOMAX) 0.4 MG CAPS capsule Take 1 capsule (0.4 mg total) by mouth daily. 30 capsule 3   albuterol (PROAIR HFA) 108 (90 Base) MCG/ACT inhaler Inhale 2 puffs every 6 (six) hours as needed into the lungs for wheezing. 1 Inhaler 0   amoxicillin-clavulanate (AUGMENTIN) 875-125 MG tablet Take 1 tablet by mouth 2 (two) times daily. (Patient not taking: Reported on 02/15/2021) 20 tablet 0   dicyclomine (BENTYL) 20 MG tablet TAKE 1 TABLET BY MOUTH EVERY 6 HOURS (Patient not taking: Reported on 02/15/2021) 60 tablet 5   No facility-administered medications prior to visit.    Allergies  Allergen Reactions   Cephalexin Anaphylaxis    REACTION: pt is not allergic to penicillin   Bactrim [Sulfamethoxazole-Trimethoprim] Other (See Comments)    ? Took Bactrim and Flexeril at same time had lip swelling unsure which caused reaction.    Bromocriptine Nausea And Vomiting   Flexeril [Cyclobenzaprine] Swelling   Invokana [Canagliflozin]     balanitis        Objective:     Physical Exam Vitals and nursing note reviewed.  Constitutional:      General: She is not in acute distress.    Appearance: Normal appearance.  HENT:     Head: Normocephalic.  Pulmonary:     Effort: No respiratory distress.  Musculoskeletal:     Cervical back: Normal range of motion.  Skin:    General: Skin is dry.     Coloration: Skin is not pale.  Neurological:     Mental Status: She  is alert and oriented to person, place, and time.  Psychiatric:        Mood and Affect: Mood normal.   BP 138/73 Comment: Home BP   Temp (!) 97.2 F (36.2 C) Comment: Pt reported.   Ht 6' (1.829 m)    Wt 198 lb 3.1 oz (89.9 kg)    BMI 26.88 kg/m   Wt Readings from Last 3 Encounters:  02/15/21 198 lb 3.1 oz (89.9 kg)  10/14/20 198 lb (89.8 kg)  09/14/20 194 lb (88 kg)       Assessment & Plan:   Problem List Items Addressed This Visit       Respiratory   Viral upper respiratory tract infection - Primary    pt reports sx for 2 weeks, taking pataday qd, Flonase and Azelastin bid, Mucinex. Advised to increase Pataday to bid. Increase water intake to 2liters/day, sending Albuterol inhaler, pt reports using in past. Advised to use at least bid, can use q6h.       Relevant Medications   albuterol (VENTOLIN HFA) 108 (90 Base) MCG/ACT inhaler    Meds ordered this encounter  Medications   albuterol (VENTOLIN HFA) 108 (90 Base) MCG/ACT inhaler    Sig: Inhale 2 puffs into the lungs every 6 (six) hours as needed for wheezing or shortness of breath.    Dispense:  8 g    Refill:  0    Order Specific Question:   Supervising Provider    Answer:   ANDY, CAMILLE L [[id number]]    I discussed the assessment and treatment plan with the patient. The patient was provided an opportunity to ask questions and all were answered. The patient agreed with the plan and  demonstrated an understanding of the instructions.   The patient was advised to call back or seek an in-person evaluation if the symptoms worsen or if the condition fails to improve as anticipated.  I provided 22 minutes of face-to-face time during this encounter.   Jeanie Sewer, NP [facility] [phone] (phone) [phone] (fax)  [place]

## 2021-02-15 NOTE — Telephone Encounter (Signed)
Pt would like all meds going forward to be sent to: [facility], [place], [place] [address] [phone].

## 2021-02-15 NOTE — Telephone Encounter (Signed)
Noted  

## 2021-02-16 ENCOUNTER — Other Ambulatory Visit: Payer: Self-pay | Admitting: Endocrinology

## 2021-02-17 ENCOUNTER — Other Ambulatory Visit: Payer: Self-pay

## 2021-02-17 ENCOUNTER — Ambulatory Visit: Payer: Medicare Other | Admitting: Endocrinology

## 2021-02-17 ENCOUNTER — Other Ambulatory Visit: Payer: Self-pay | Admitting: Family Medicine

## 2021-02-17 VITALS — BP 124/80 | HR 71 | Ht 72.0 in | Wt 209.2 lb

## 2021-02-17 DIAGNOSIS — E119 Type 2 diabetes mellitus without complications: Secondary | ICD-10-CM | POA: Diagnosis not present

## 2021-02-17 DIAGNOSIS — I10 Essential (primary) hypertension: Secondary | ICD-10-CM

## 2021-02-17 LAB — POCT GLYCOSYLATED HEMOGLOBIN (HGB A1C): Hemoglobin A1C: 7.8 % — AB (ref 4.0–5.6)

## 2021-02-17 MED ORDER — OZEMPIC (2 MG/DOSE) 8 MG/3ML ~~LOC~~ SOPN
2.0000 mg | PEN_INJECTOR | SUBCUTANEOUS | 3 refills | Status: DC
  Filled 2021-03-04: qty 9, 84d supply, fill #0

## 2021-02-17 MED ORDER — REPAGLINIDE 0.5 MG PO TABS: 0.5000 mg | ORAL_TABLET | Freq: Two times a day (BID) | ORAL | 3 refills | Status: DC

## 2021-02-17 NOTE — Patient Instructions (Addendum)
I have sent 2 prescriptions to your pharmacy: to increase the repaglinide to twice a day (just before 2 meals), and to change the Trulicity to Ozempic.  Please continue the same other medications check your blood sugar once a day.  vary the time of day when you check, between before the 3 meals, and at bedtime.  also check if you have symptoms of your blood sugar being too high or too low.  please keep a record of the readings and bring it to your next appointment here (or you can bring the meter itself).  You can write it on any piece of paper.  please call us sooner if your blood sugar goes below 70, or if you have a lot of readings over 200.  Please come back for a follow-up appointment in 2-3 months.

## 2021-02-17 NOTE — Progress Notes (Signed)
Subjective:    Patient ID: Bruce Romero, male    DOB: Jun 07, 1945, 76 y.o.   MRN: [id number]  HPI Pt returns for f/u of diabetes mellitus:  DM type: 2 Dx'ed: 6010 Complications: PN. Therapy: trulicity and 2 oral meds.  DKA: never.  Severe hypoglycemia: never.   Pancreatitis: never.  Other: he has never been on insulin; he did not tolerate invokana (yeast infection).   Interval history: nausea and heartburn are still mild.  He forgets the repaglinide.  He says cbg's are in the 100's.  He takes meds as rx'ed.  2 mos ago, he took steroids for should pain.  He is having trouble getting Trulicity.   Past Medical History:  Diagnosis Date   Acute medial meniscal tear, left, initial encounter    Anxiety    Diabetes mellitus    GERD (gastroesophageal reflux disease)    Hyperlipidemia    Hypertension    Irritable bowel    Spastic colon    anaphylaxis due to [place]    Past Surgical History:  Procedure Laterality Date   CHOLECYSTECTOMY     CHONDROPLASTY Left 01/15/2016   Procedure: CHONDROPLASTY;  Surgeon: Melrose Nakayama, MD;  Location: [facility];  Service: Orthopedics;  Laterality: Left;   KNEE ARTHROSCOPY WITH MEDIAL MENISECTOMY Left 01/15/2016   Procedure: ARTHROSCOPY LEFT KNEE WITH PARTIAL MEDIAL MENISECTOMY;  Surgeon: Melrose Nakayama, MD;  Location: [facility];  Service: Orthopedics;  Laterality: Left;   NASAL SINUS SURGERY      Social History   Socioeconomic History   Marital status: Married    Spouse name: Not on file   Number of children: Not on file   Years of education: Not on file   Highest education level: Not on file  Occupational History   Occupation: retired  Tobacco Use   Smoking status: Former   Smokeless tobacco: Never  Substance and Sexual Activity   Alcohol use: Yes    Alcohol/week: 0.0 standard drinks    Comment: social   Drug use: No   Sexual activity: Yes  Other Topics Concern   Not on file  Social History  Narrative   Exercise try to everyday.   Social Determinants of Health   Financial Resource Strain: Not on file  Food Insecurity: Not on file  Transportation Needs: Not on file  Physical Activity: Not on file  Stress: Not on file  Social Connections: Not on file  Intimate Partner Violence: Not on file    Current Outpatient Medications on File Prior to Visit  Medication Sig Dispense Refill   albuterol (VENTOLIN HFA) 108 (90 Base) MCG/ACT inhaler Inhale 2 puffs into the lungs every 6 (six) hours as needed for wheezing or shortness of breath. 8 g 0   ALPRAZolam (XANAX) 0.25 MG tablet take 1 tablet by mouth three times a day if needed 30 tablet 0   amLODipine (NORVASC) 10 MG tablet TAKE 1 TABLET(10 MG) BY MOUTH DAILY 90 tablet 1   atorvastatin (LIPITOR) 10 MG tablet TAKE 1 TABLET(10 MG) BY MOUTH DAILY 90 tablet 1   azelastine (ASTELIN) 0.1 % nasal spray Place 1 spray into both nostrils 2 (two) times daily. Use in each nostril as directed 30 mL 12   Cholecalciferol (VITAMIN D) 2000 UNITS tablet Take 2,000 Units by mouth daily.     co-enzyme Q-10 30 MG capsule Take 100 mg by mouth daily.     esomeprazole (NEXIUM) 40 MG capsule Take 20 mg by mouth  daily.     fluticasone (FLONASE) 50 MCG/ACT nasal spray Place into both nostrils daily.     icosapent Ethyl (VASCEPA) 1 g capsule TAKE 2 CAPSULES(2 GRAMS) BY MOUTH TWICE DAILY 360 capsule 1   Insulin Pen Needle 32G X 6 MM MISC Use as directed with insulin pen 30 each 3   linaclotide (LINZESS) 145 MCG CAPS capsule Take 1 capsule (145 mcg total) by mouth daily before breakfast. 90 capsule 3   linaclotide (LINZESS) 290 MCG CAPS capsule Take 1 capsule (290 mcg total) by mouth daily before breakfast. 90 capsule 3   meloxicam (MOBIC) 7.5 MG tablet Take 1 tablet (7.5 mg total) by mouth daily. 30 tablet 0   metFORMIN (GLUCOPHAGE-XR) 750 MG 24 hr tablet Take 1 tablet (750 mg total) by mouth daily. 90 tablet 3   niacin (SLO-NIACIN) 500 MG tablet Take 500 mg by  mouth at bedtime.     ofloxacin (FLOXIN OTIC) 0.3 % OTIC solution Place 10 drops into the right ear daily. 5 mL 0   ONETOUCH VERIO test strip TEST ONCE DAILY 100 strip 12   rOPINIRole (REQUIP) 0.25 MG tablet 1 po qhs x 3 days then increase to 2 po qhs 60 tablet 3   tamsulosin (FLOMAX) 0.4 MG CAPS capsule Take 1 capsule (0.4 mg total) by mouth daily. 30 capsule 3   No current facility-administered medications on file prior to visit.    Allergies  Allergen Reactions   Cephalexin Anaphylaxis    REACTION: pt is not allergic to penicillin   Bactrim [Sulfamethoxazole-Trimethoprim] Other (See Comments)    ? Took Bactrim and Flexeril at same time had lip swelling unsure which caused reaction.   Bromocriptine Nausea And Vomiting   Flexeril [Cyclobenzaprine] Swelling   Invokana [Canagliflozin]     balanitis    Family History  Problem Relation Age of Onset   Stroke Mother    Pulmonary embolism Father    Diabetes Brother     BP 124/80    Pulse 71    Ht 6' (1.829 m)    Wt 209 lb 3.2 oz (94.9 kg)    SpO2 97%    BMI 28.37 kg/m    Review of Systems     Objective:   Physical Exam    Lab Results  Component Value Date   HGBA1C 7.8 (A) 02/17/2021      Assessment & Plan:  Type 2 DM: uncontrolled, exac by steroids.   Patient Instructions  I have sent 2 prescriptions to your pharmacy: to increase the repaglinide to twice a day (just before 2 meals), and to change the Trulicity to Ozempic.  Please continue the same other medications check your blood sugar once a day.  vary the time of day when you check, between before the 3 meals, and at bedtime.  also check if you have symptoms of your blood sugar being too high or too low.  please keep a record of the readings and bring it to your next appointment here (or you can bring the meter itself).  You can write it on any piece of paper.  please call us sooner if your blood sugar goes below 70, or if you have a lot of readings over 200.   Please come back for a follow-up appointment in 2-3 months.

## 2021-02-18 DIAGNOSIS — L821 Other seborrheic keratosis: Secondary | ICD-10-CM | POA: Diagnosis not present

## 2021-02-18 DIAGNOSIS — L84 Corns and callosities: Secondary | ICD-10-CM | POA: Diagnosis not present

## 2021-02-18 DIAGNOSIS — L570 Actinic keratosis: Secondary | ICD-10-CM | POA: Diagnosis not present

## 2021-02-23 ENCOUNTER — Other Ambulatory Visit: Payer: Self-pay | Admitting: Family Medicine

## 2021-02-23 ENCOUNTER — Telehealth: Payer: Self-pay | Admitting: Family Medicine

## 2021-02-23 DIAGNOSIS — J0140 Acute pansinusitis, unspecified: Secondary | ICD-10-CM

## 2021-02-23 MED ORDER — AMOXICILLIN-POT CLAVULANATE 875-125 MG PO TABS: 1.0000 | ORAL_TABLET | Freq: Two times a day (BID) | ORAL | 0 refills | Status: DC

## 2021-02-23 NOTE — Telephone Encounter (Signed)
Patient would like to know if he could be put on an antibiotic or something similar since his wheezing and congestion have not gotten any better since his 01/09 VV. Please advice.

## 2021-02-23 NOTE — Telephone Encounter (Signed)
Pt has had two virtual appointments for this concern on 12/11/20 and 02/15/21. Please advise

## 2021-02-24 NOTE — Telephone Encounter (Signed)
Pt was made aware and had started taking medication yesterday afternoon.

## 2021-02-26 ENCOUNTER — Other Ambulatory Visit (HOSPITAL_COMMUNITY): Payer: Self-pay

## 2021-02-26 ENCOUNTER — Telehealth: Payer: Self-pay

## 2021-02-26 NOTE — Telephone Encounter (Signed)
No PA needed for Ozempic 90 day supply.  Copay is $141.00.

## 2021-03-02 ENCOUNTER — Encounter: Payer: Self-pay | Admitting: Family Medicine

## 2021-03-02 LAB — HM DIABETES EYE EXAM

## 2021-03-04 ENCOUNTER — Other Ambulatory Visit (HOSPITAL_COMMUNITY): Payer: Self-pay

## 2021-03-10 ENCOUNTER — Other Ambulatory Visit: Payer: Self-pay | Admitting: Family Medicine

## 2021-03-23 ENCOUNTER — Other Ambulatory Visit: Payer: Self-pay | Admitting: Family Medicine

## 2021-03-23 DIAGNOSIS — F411 Generalized anxiety disorder: Secondary | ICD-10-CM

## 2021-03-23 MED ORDER — ALPRAZOLAM 0.25 MG PO TABS: ORAL_TABLET | ORAL | 0 refills | Status: DC

## 2021-03-23 NOTE — Telephone Encounter (Signed)
.  Requesting: Xanax 0.25 Contract: 07/12/19 UDS: 07/12/19 Last Visit: 09/14/20 Next Visit:04/08/21 Last Refill: 12/21/20  Please Advise

## 2021-03-23 NOTE — Telephone Encounter (Signed)
Requesting:Xanxa Contract  07/12/2019 FYI Folder UDS:08/08/2019 Last Visit:09/14/2020 Next Visit:04/08/2021 Last Refill:12/21/2020  Please Advise

## 2021-03-29 ENCOUNTER — Encounter: Payer: Self-pay | Admitting: Family Medicine

## 2021-03-29 ENCOUNTER — Encounter: Payer: Self-pay | Admitting: Endocrinology

## 2021-03-31 ENCOUNTER — Other Ambulatory Visit: Payer: Self-pay | Admitting: Family Medicine

## 2021-03-31 MED ORDER — LINACLOTIDE 290 MCG PO CAPS: 290.0000 ug | ORAL_CAPSULE | Freq: Every day | ORAL | 3 refills | Status: DC

## 2021-04-08 ENCOUNTER — Encounter: Payer: Self-pay | Admitting: Family Medicine

## 2021-04-08 ENCOUNTER — Other Ambulatory Visit: Payer: Self-pay | Admitting: Family Medicine

## 2021-04-08 ENCOUNTER — Ambulatory Visit (INDEPENDENT_AMBULATORY_CARE_PROVIDER_SITE_OTHER): Payer: Medicare Other | Admitting: Family Medicine

## 2021-04-08 VITALS — BP 130/72 | HR 72 | Temp 97.8°F | Resp 16 | Ht 69.0 in | Wt 203.4 lb

## 2021-04-08 DIAGNOSIS — K219 Gastro-esophageal reflux disease without esophagitis: Secondary | ICD-10-CM | POA: Diagnosis not present

## 2021-04-08 DIAGNOSIS — E785 Hyperlipidemia, unspecified: Secondary | ICD-10-CM | POA: Diagnosis not present

## 2021-04-08 DIAGNOSIS — E559 Vitamin D deficiency, unspecified: Secondary | ICD-10-CM | POA: Diagnosis not present

## 2021-04-08 DIAGNOSIS — E119 Type 2 diabetes mellitus without complications: Secondary | ICD-10-CM

## 2021-04-08 DIAGNOSIS — R110 Nausea: Secondary | ICD-10-CM

## 2021-04-08 DIAGNOSIS — R350 Frequency of micturition: Secondary | ICD-10-CM

## 2021-04-08 DIAGNOSIS — I10 Essential (primary) hypertension: Secondary | ICD-10-CM

## 2021-04-08 DIAGNOSIS — G4700 Insomnia, unspecified: Secondary | ICD-10-CM

## 2021-04-08 DIAGNOSIS — G2581 Restless legs syndrome: Secondary | ICD-10-CM | POA: Diagnosis not present

## 2021-04-08 DIAGNOSIS — Z0000 Encounter for general adult medical examination without abnormal findings: Secondary | ICD-10-CM

## 2021-04-08 DIAGNOSIS — E1169 Type 2 diabetes mellitus with other specified complication: Secondary | ICD-10-CM | POA: Diagnosis not present

## 2021-04-08 LAB — CBC WITH DIFFERENTIAL/PLATELET
Basophils Absolute: 0.1 10*3/uL (ref 0.0–0.1)
Basophils Relative: 0.8 % (ref 0.0–3.0)
Eosinophils Absolute: 0.4 10*3/uL (ref 0.0–0.7)
Eosinophils Relative: 4.4 % (ref 0.0–5.0)
HCT: 39 % (ref 39.0–52.0)
Hemoglobin: 13.2 g/dL (ref 13.0–17.0)
Lymphocytes Relative: 28.8 % (ref 12.0–46.0)
Lymphs Abs: 2.6 10*3/uL (ref 0.7–4.0)
MCHC: 33.8 g/dL (ref 30.0–36.0)
MCV: 83.2 fl (ref 78.0–100.0)
Monocytes Absolute: 0.7 10*3/uL (ref 0.1–1.0)
Monocytes Relative: 7.3 % (ref 3.0–12.0)
Neutro Abs: 5.3 10*3/uL (ref 1.4–7.7)
Neutrophils Relative %: 58.7 % (ref 43.0–77.0)
Platelets: 242 10*3/uL (ref 150.0–400.0)
RBC: 4.68 Mil/uL (ref 4.22–5.81)
RDW: 13.3 % (ref 11.5–15.5)
WBC: 9.1 10*3/uL (ref 4.0–10.5)

## 2021-04-08 LAB — COMPREHENSIVE METABOLIC PANEL
ALT: 24 U/L (ref 0–53)
AST: 31 U/L (ref 0–37)
Albumin: 4.4 g/dL (ref 3.5–5.2)
Alkaline Phosphatase: 61 U/L (ref 39–117)
BUN: 14 mg/dL (ref 6–23)
CO2: 27 mEq/L (ref 19–32)
Calcium: 9.5 mg/dL (ref 8.4–10.5)
Chloride: 96 mEq/L (ref 96–112)
Creatinine, Ser: 0.91 mg/dL (ref 0.40–1.50)
GFR: 81.77 mL/min (ref >60.00)
Glucose, Bld: 119 mg/dL — ABNORMAL HIGH (ref 70–99)
Potassium: 4.2 mEq/L (ref 3.5–5.1)
Sodium: 134 mEq/L — ABNORMAL LOW (ref 135–145)
Total Bilirubin: 0.7 mg/dL (ref 0.2–1.2)
Total Protein: 7.1 g/dL (ref 6.0–8.3)

## 2021-04-08 LAB — LIPID PANEL
Cholesterol: 99 mg/dL (ref 0–200)
HDL: 46.3 mg/dL (ref >39.00)
LDL Cholesterol: 34 mg/dL (ref 0–99)
NonHDL: 52.65
Total CHOL/HDL Ratio: 2
Triglycerides: 91 mg/dL (ref 0.0–149.0)
VLDL: 18.2 mg/dL (ref 0.0–40.0)

## 2021-04-08 LAB — PSA: PSA: 0.33 ng/mL (ref 0.10–4.00)

## 2021-04-08 LAB — VITAMIN D 25 HYDROXY (VIT D DEFICIENCY, FRACTURES): VITD: 59.48 ng/mL (ref 30.00–100.00)

## 2021-04-08 MED ORDER — ONDANSETRON HCL 4 MG PO TABS: 4.0000 mg | ORAL_TABLET | Freq: Three times a day (TID) | ORAL | 0 refills | Status: DC | PRN

## 2021-04-08 MED ORDER — TAMSULOSIN HCL 0.4 MG PO CAPS: 0.4000 mg | ORAL_CAPSULE | Freq: Every day | ORAL | 3 refills | Status: DC

## 2021-04-08 MED ORDER — ESOMEPRAZOLE MAGNESIUM 40 MG PO CPDR: 40.0000 mg | DELAYED_RELEASE_CAPSULE | Freq: Every day | ORAL | 3 refills | Status: DC

## 2021-04-08 MED ORDER — TRAZODONE HCL 50 MG PO TABS
25.0000 mg | ORAL_TABLET | Freq: Every evening | ORAL | 3 refills | Status: DC | PRN
Start: 2021-04-08 — End: 2022-01-17

## 2021-04-08 MED ORDER — CELECOXIB 100 MG PO CAPS: 100.0000 mg | ORAL_CAPSULE | Freq: Every day | ORAL | 1 refills | Status: DC

## 2021-04-08 MED ORDER — ROPINIROLE HCL 1 MG PO TABS: 1.0000 mg | ORAL_TABLET | Freq: Every day | ORAL | 3 refills | Status: DC

## 2021-04-08 NOTE — Progress Notes (Signed)
Subjective:   By signing my name below, I, Bruce Romero, attest that this documentation has been prepared under the direction and in the presence of Bruce Held, DO. 04/08/2021    Patient ID: Bruce Romero, male    DOB: 09/22/1945, 76 y.o.   MRN: [id number]  Chief Complaint  Patient presents with   Annual Exam    Here for Annual Exam     HPI Patient is in today for a comprehensive physical exam.   He complains of difficulty falling asleep. He is taking 0.25 mg Requip but finds it does not help him fall asleep.  He continues taking 0.25 mg Requip and reports improvement in his restless leg but continues experiencing his symptoms.He is requesting to increase his dosage.  He continues taking 290 mcg linzess PRN to manage his bowel movements. He reports not taking it every day and only when feeling his symptoms are starting.  He continues having pain from arthritis. He is taking tylenol and Advil to manage symptoms and finds mild relief. He was given Celebrex prior but found no change in his pain so he stopped taking it after 2 weeks. He does not take tylenol or Advil regularly due to fear of developing an ulcer or other GI issues.  He reports not being able to receive his trulicity prescription due to being on back order and was switched to ozempic but found that was also on back order as well.  He continues seeing his urologist, dermatologist, endocrinologist regularly.  He continues taking 40 mg Nexium daily PO and reports no new issues while taking it. He does not see a GI specialist regularly.  She denies having any fever, new muscle pain, new joint pain, new moles, congestion, sinus pain, sore throat, chest pain, palpations, cough, SOB, wheezing, n/v/d, constipation, blood in stool, dysuria, frequency, hematuria, or headaches at this time. He has 3 Covid-19 vaccines at this time.  He is UTD on vision care. He reports experiencing dry eyes and was given eye drops to manage  his symptoms.    Past Medical History:  Diagnosis Date   Acute medial meniscal tear, left, initial encounter    Anxiety    Diabetes mellitus    GERD (gastroesophageal reflux disease)    Hyperlipidemia    Hypertension    Irritable bowel    Spastic colon    anaphylaxis due to [place]    Past Surgical History:  Procedure Laterality Date   CHOLECYSTECTOMY     CHONDROPLASTY Left 01/15/2016   Procedure: CHONDROPLASTY;  Surgeon: Melrose Nakayama, MD;  Location: [facility];  Service: Orthopedics;  Laterality: Left;   KNEE ARTHROSCOPY WITH MEDIAL MENISECTOMY Left 01/15/2016   Procedure: ARTHROSCOPY LEFT KNEE WITH PARTIAL MEDIAL MENISECTOMY;  Surgeon: Melrose Nakayama, MD;  Location: [facility];  Service: Orthopedics;  Laterality: Left;   NASAL SINUS SURGERY      Family History  Problem Relation Age of Onset   Stroke Mother    Pulmonary embolism Father    Diabetes Brother     Social History   Socioeconomic History   Marital status: Married    Spouse name: Not on file   Number of children: Not on file   Years of education: Not on file   Highest education level: Not on file  Occupational History   Occupation: retired  Tobacco Use   Smoking status: Former   Smokeless tobacco: Never  Substance and Sexual Activity   Alcohol use:  Yes    Alcohol/week: 0.0 standard drinks    Comment: social   Drug use: No   Sexual activity: Yes  Other Topics Concern   Not on file  Social History Narrative   Exercise try to everyday.   Social Determinants of Health   Financial Resource Strain: Not on file  Food Insecurity: Not on file  Transportation Needs: Not on file  Physical Activity: Not on file  Stress: Not on file  Social Connections: Not on file  Intimate Partner Violence: Not on file    Outpatient Medications Prior to Visit  Medication Sig Dispense Refill   albuterol (VENTOLIN HFA) 108 (90 Base) MCG/ACT inhaler Inhale 2 puffs into the lungs every  6 (six) hours as needed for wheezing or shortness of breath. 8 g 0   ALPRAZolam (XANAX) 0.25 MG tablet take 1 tablet by mouth three times a day if needed 30 tablet 0   amLODipine (NORVASC) 10 MG tablet TAKE 1 TABLET(10 MG) BY MOUTH DAILY 90 tablet 1   amoxicillin-clavulanate (AUGMENTIN) 875-125 MG tablet Take 1 tablet by mouth 2 (two) times daily. 20 tablet 0   atorvastatin (LIPITOR) 10 MG tablet TAKE 1 TABLET(10 MG) BY MOUTH DAILY 90 tablet 1   azelastine (ASTELIN) 0.1 % nasal spray Place 1 spray into both nostrils 2 (two) times daily. Use in each nostril as directed 30 mL 12   candesartan (ATACAND) 16 MG tablet TAKE 1 TABLET(16 MG) BY MOUTH AT BEDTIME 90 tablet 1   Cholecalciferol (VITAMIN D) 2000 UNITS tablet Take 2,000 Units by mouth daily.     co-enzyme Q-10 30 MG capsule Take 100 mg by mouth daily.     fluticasone (FLONASE) 50 MCG/ACT nasal spray Place into both nostrils daily.     icosapent Ethyl (VASCEPA) 1 g capsule TAKE 2 CAPSULES(2 GRAMS) BY MOUTH TWICE DAILY 360 capsule 1   Insulin Pen Needle 32G X 6 MM MISC Use as directed with insulin pen 30 each 3   linaclotide (LINZESS) 290 MCG CAPS capsule Take 1 capsule (290 mcg total) by mouth daily before breakfast. 90 capsule 3   linaclotide (LINZESS) 290 MCG CAPS capsule Take 1 capsule (290 mcg total) by mouth daily before breakfast. 90 capsule 3   meloxicam (MOBIC) 7.5 MG tablet Take 1 tablet (7.5 mg total) by mouth daily. 30 tablet 0   metFORMIN (GLUCOPHAGE-XR) 750 MG 24 hr tablet Take 1 tablet (750 mg total) by mouth daily. 90 tablet 3   niacin (SLO-NIACIN) 500 MG tablet Take 500 mg by mouth at bedtime.     ofloxacin (FLOXIN OTIC) 0.3 % OTIC solution Place 10 drops into the right ear daily. 5 mL 0   ONETOUCH VERIO test strip TEST ONCE DAILY 100 strip 12   repaglinide (PRANDIN) 0.5 MG tablet Take 1 tablet (0.5 mg total) by mouth 2 (two) times daily before a meal. 180 tablet 3   Semaglutide, 2 MG/DOSE, (OZEMPIC, 2 MG/DOSE,) 8 MG/3ML SOPN  Inject 2 mg into the skin once a week. 9 mL 3   esomeprazole (NEXIUM) 40 MG capsule Take 20 mg by mouth daily.     rOPINIRole (REQUIP) 0.25 MG tablet 1 po qhs x 3 days then increase to 2 po qhs 60 tablet 3   tamsulosin (FLOMAX) 0.4 MG CAPS capsule Take 1 capsule (0.4 mg total) by mouth daily. 30 capsule 3   No facility-administered medications prior to visit.    Allergies  Allergen Reactions   Cephalexin Anaphylaxis  REACTION: pt is not allergic to penicillin   Bactrim [Sulfamethoxazole-Trimethoprim] Other (See Comments)    ? Took Bactrim and Flexeril at same time had lip swelling unsure which caused reaction.   Bromocriptine Nausea And Vomiting   Flexeril [Cyclobenzaprine] Swelling   Invokana [Canagliflozin]     balanitis    Review of Systems  Constitutional:  Negative for fever.  HENT:  Negative for congestion and sore throat.   Respiratory:  Negative for cough, shortness of breath and wheezing.   Cardiovascular:  Negative for chest pain.  Gastrointestinal:  Negative for blood in stool, constipation, diarrhea, nausea and vomiting.  Genitourinary:  Negative for dysuria, frequency and hematuria.  Musculoskeletal:  Negative for joint pain and myalgias.  Skin:        (-)New moles  Neurological:  Negative for headaches.  Psychiatric/Behavioral:         (+)difficulty falling asleep      Objective:    Physical Exam Constitutional:      General: He is not in acute distress.    Appearance: Normal appearance. He is not ill-appearing.  HENT:     Head: Normocephalic and atraumatic.     Right Ear: Tympanic membrane, ear canal and external ear normal.     Left Ear: Tympanic membrane, ear canal and external ear normal.  Eyes:     Extraocular Movements: Extraocular movements intact.     Pupils: Pupils are equal, round, and reactive to light.  Cardiovascular:     Rate and Rhythm: Normal rate and regular rhythm.     Heart sounds: Normal heart sounds. No murmur heard.   No gallop.   Pulmonary:     Effort: Pulmonary effort is normal. No respiratory distress.     Breath sounds: Normal breath sounds. No wheezing or rales.  Abdominal:     General: Bowel sounds are normal. There is no distension.     Palpations: Abdomen is soft.     Tenderness: There is no abdominal tenderness. There is no guarding.  Skin:    General: Skin is warm and dry.  Neurological:     Mental Status: He is alert and oriented to person, place, and time.  Psychiatric:        Judgment: Judgment normal.    BP 130/72 (BP Location: Right Arm, Patient Position: Sitting, Cuff Size: Normal)    Pulse 72    Temp 97.8 F (36.6 C) (Oral)    Resp 16    Ht 5\' 9"  (1.753 m)    Wt 203 lb 6.4 oz (92.3 kg)    SpO2 96%    BMI 30.04 kg/m  Wt Readings from Last 3 Encounters:  04/08/21 203 lb 6.4 oz (92.3 kg)  02/17/21 209 lb 3.2 oz (94.9 kg)  02/15/21 198 lb 3.1 oz (89.9 kg)    Diabetic Foot Exam - Simple   No data filed    Lab Results  Component Value Date   WBC 9.1 04/08/2021   HGB 13.2 04/08/2021   HCT 39.0 04/08/2021   PLT 242.0 04/08/2021   GLUCOSE 119 (H) 04/08/2021   CHOL 99 04/08/2021   TRIG 91.0 04/08/2021   HDL 46.30 04/08/2021   LDLDIRECT 74.0 03/30/2018   LDLCALC 34 04/08/2021   ALT 24 04/08/2021   AST 31 04/08/2021   NA 134 (L) 04/08/2021   K 4.2 04/08/2021   CL 96 04/08/2021   CREATININE 0.91 04/08/2021   BUN 14 04/08/2021   CO2 27 04/08/2021   TSH 2.28 09/04/2019  PSA 0.33 04/08/2021   HGBA1C 7.8 (A) 02/17/2021   MICROALBUR <0.7 06/27/2016    Lab Results  Component Value Date   TSH 2.28 09/04/2019   Lab Results  Component Value Date   WBC 9.1 04/08/2021   HGB 13.2 04/08/2021   HCT 39.0 04/08/2021   MCV 83.2 04/08/2021   PLT 242.0 04/08/2021   Lab Results  Component Value Date   NA 134 (L) 04/08/2021   K 4.2 04/08/2021   CO2 27 04/08/2021   GLUCOSE 119 (H) 04/08/2021   BUN 14 04/08/2021   CREATININE 0.91 04/08/2021   BILITOT 0.7 04/08/2021   ALKPHOS 61  04/08/2021   AST 31 04/08/2021   ALT 24 04/08/2021   PROT 7.1 04/08/2021   ALBUMIN 4.4 04/08/2021   CALCIUM 9.5 04/08/2021   GFR 81.77 04/08/2021   Lab Results  Component Value Date   CHOL 99 04/08/2021   Lab Results  Component Value Date   HDL 46.30 04/08/2021   Lab Results  Component Value Date   LDLCALC 34 04/08/2021   Lab Results  Component Value Date   TRIG 91.0 04/08/2021   Lab Results  Component Value Date   CHOLHDL 2 04/08/2021   Lab Results  Component Value Date   HGBA1C 7.8 (A) 02/17/2021   Colonoscopy- Last completed 06/28/2010. Results showed one small polyp in sigmoid colon which was removed, diverticulosis in sigmoid colon, otherwise results are normal.  PSA- Last completed 08/08/2019. Results showed 0.36.      Assessment & Plan:   Problem List Items Addressed This Visit       High   Preventative health care - Primary    ghm utd Check labs  See avs         Unprioritized   REFLUX, ESOPHAGEAL   Relevant Medications   esomeprazole (NEXIUM) 40 MG capsule   FREQUENCY, URINARY   Relevant Medications   tamsulosin (FLOMAX) 0.4 MG CAPS capsule   Insomnia   Relevant Medications   traZODone (DESYREL) 50 MG tablet   RLS (restless legs syndrome)   Relevant Medications   rOPINIRole (REQUIP) 1 MG tablet   Other Relevant Orders   CBC with Differential/Platelet (Completed)   Comprehensive metabolic panel (Completed)   Lipid panel (Completed)   PSA (Completed)   Diabetes ([place])    Per endo      Essential hypertension    Well controlled, no changes to meds. Encouraged heart healthy diet such as the DASH diet and exercise as tolerated.       Hyperlipidemia    Encourage heart healthy diet such as MIND or DASH diet, increase exercise, avoid trans fats, simple carbohydrates and processed foods, consider a krill or fish or flaxseed oil cap daily.       Other Visit Diagnoses     Hyperlipidemia associated with type 2 diabetes mellitus ([place])        Relevant Orders   CBC with Differential/Platelet (Completed)   Comprehensive metabolic panel (Completed)   Lipid panel (Completed)   PSA (Completed)   Primary hypertension       Relevant Orders   CBC with Differential/Platelet (Completed)   Comprehensive metabolic panel (Completed)   Lipid panel (Completed)   PSA (Completed)   Vitamin D deficiency       Relevant Orders   VITAMIN D 25 Hydroxy (Vit-D Deficiency, Fractures) (Completed)        Meds ordered this encounter  Medications   rOPINIRole (REQUIP) 1 MG tablet    Sig:  Take 1 tablet (1 mg total) by mouth at bedtime.    Dispense:  90 tablet    Refill:  3   traZODone (DESYREL) 50 MG tablet    Sig: Take 0.5-1 tablets (25-50 mg total) by mouth at bedtime as needed for sleep.    Dispense:  30 tablet    Refill:  3   tamsulosin (FLOMAX) 0.4 MG CAPS capsule    Sig: Take 1 capsule (0.4 mg total) by mouth daily.    Dispense:  90 capsule    Refill:  3   esomeprazole (NEXIUM) 40 MG capsule    Sig: Take 1 capsule (40 mg total) by mouth daily.    Dispense:  90 capsule    Refill:  3   celecoxib (CELEBREX) 100 MG capsule    Sig: Take 1 capsule (100 mg total) by mouth daily.    Dispense:  90 capsule    Refill:  1    I, Bruce Held, DO, personally preformed the services described in this documentation.  All medical record entries made by the scribe were at my direction and in my presence.  I have reviewed the chart and discharge instructions (if applicable) and agree that the record reflects my personal performance and is accurate and complete. 04/08/2021   I,Bruce Romero,acting as a scribe for Bruce Held, DO.,have documented all relevant documentation on the behalf of Bruce Held, DO,as directed by  Bruce Held, DO while in the presence of Bruce Held, DO.   Bruce Held, DO

## 2021-04-08 NOTE — Patient Instructions (Signed)
Preventive Care 65 Years and Older, Male °Preventive care refers to lifestyle choices and visits with your health care provider that can promote health and wellness. Preventive care visits are also called wellness exams. °What can I expect for my preventive care visit? °Counseling °During your preventive care visit, your health care provider may ask about your: °Medical history, including: °Past medical problems. °Family medical history. °History of falls. °Current health, including: °Emotional well-being. °Home life and relationship well-being. °Sexual activity. °Memory and ability to understand (cognition). °Lifestyle, including: °Alcohol, nicotine or tobacco, and drug use. °Access to firearms. °Diet, exercise, and sleep habits. °Work and work environment. °Sunscreen use. °Safety issues such as seatbelt and bike helmet use. °Physical exam °Your health care provider will check your: °Height and weight. These may be used to calculate your BMI (body mass index). BMI is a measurement that tells if you are at a healthy weight. °Waist circumference. This measures the distance around your waistline. This measurement also tells if you are at a healthy weight and may help predict your risk of certain diseases, such as type 2 diabetes and high blood pressure. °Heart rate and blood pressure. °Body temperature. °Skin for abnormal spots. °What immunizations do I need? °Vaccines are usually given at various ages, according to a schedule. Your health care provider will recommend vaccines for you based on your age, medical history, and lifestyle or other factors, such as travel or where you work. °What tests do I need? °Screening °Your health care provider may recommend screening tests for certain conditions. This may include: °Lipid and cholesterol levels. °Diabetes screening. This is done by checking your blood sugar (glucose) after you have not eaten for a while (fasting). °Hepatitis C test. °Hepatitis B test. °HIV (human  immunodeficiency virus) test. °STI (sexually transmitted infection) testing, if you are at risk. °Lung cancer screening. °Colorectal cancer screening. °Prostate cancer screening. °Abdominal aortic aneurysm (AAA) screening. You may need this if you are a current or former smoker. °Talk with your health care provider about your test results, treatment options, and if necessary, the need for more tests. °Follow these instructions at home: °Eating and drinking ° °Eat a diet that includes fresh fruits and vegetables, whole grains, lean protein, and low-fat dairy products. Limit your intake of foods with high amounts of sugar, saturated fats, and salt. °Take vitamin and mineral supplements as recommended by your health care provider. °Do not drink alcohol if your health care provider tells you not to drink. °If you drink alcohol: °Limit how much you have to 0-2 drinks a day. °Know how much alcohol is in your drink. In the [place] one drink equals one 12 oz bottle of beer (355 mL), one 5 oz glass of wine (148 mL), or one 1½ oz glass of hard liquor (44 mL). °Lifestyle °Brush your teeth every morning and night with fluoride toothpaste. Floss one time each day. °Exercise for at least 30 minutes 5 or more days each week. °Do not use any products that contain nicotine or tobacco. These products include cigarettes, chewing tobacco, and vaping devices, such as e-cigarettes. If you need help quitting, ask your health care provider. °Do not use drugs. °If you are sexually active, practice safe sex. Use a condom or other form of protection to prevent STIs. °Take aspirin only as told by your health care provider. Make sure that you understand how much to take and what form to take. Work with your health care provider to find out whether it is safe and   beneficial for you to take aspirin daily. °Ask your health care provider if you need to take a cholesterol-lowering medicine (statin). °Find healthy ways to manage stress, such  as: °Meditation, yoga, or listening to music. °Journaling. °Talking to a trusted person. °Spending time with friends and family. °Safety °Always wear your seat belt while driving or riding in a vehicle. °Do not drive: °If you have been drinking alcohol. Do not ride with someone who has been drinking. °When you are tired or distracted. °While texting. °If you have been using any mind-altering substances or drugs. °Wear a helmet and other protective equipment during sports activities. °If you have firearms in your house, make sure you follow all gun safety procedures. °Minimize exposure to UV radiation to reduce your risk of skin cancer. °What's next? °Visit your health care provider once a year for an annual wellness visit. °Ask your health care provider how often you should have your eyes and teeth checked. °Stay up to date on all vaccines. °This information is not intended to replace advice given to you by your health care provider. Make sure you discuss any questions you have with your health care provider. °Document Revised: 07/22/2020 Document Reviewed: 07/22/2020 °Elsevier Patient Education © 2022 [employer] ° °

## 2021-04-09 NOTE — Assessment & Plan Note (Signed)
Well controlled, no changes to meds. Encouraged heart healthy diet such as the DASH diet and exercise as tolerated.  °

## 2021-04-09 NOTE — Assessment & Plan Note (Signed)
ghm utd Check labs  See avs  

## 2021-04-09 NOTE — Assessment & Plan Note (Signed)
Per endo °

## 2021-04-09 NOTE — Assessment & Plan Note (Signed)
Encourage heart healthy diet such as MIND or DASH diet, increase exercise, avoid trans fats, simple carbohydrates and processed foods, consider a krill or fish or flaxseed oil cap daily.  °

## 2021-04-17 ENCOUNTER — Other Ambulatory Visit: Payer: Self-pay | Admitting: Family Medicine

## 2021-04-17 DIAGNOSIS — E781 Pure hyperglyceridemia: Secondary | ICD-10-CM

## 2021-05-15 ENCOUNTER — Other Ambulatory Visit (HOSPITAL_COMMUNITY): Payer: Self-pay

## 2021-05-17 ENCOUNTER — Encounter: Payer: Self-pay | Admitting: Endocrinology

## 2021-05-17 ENCOUNTER — Other Ambulatory Visit: Payer: Self-pay | Admitting: Internal Medicine

## 2021-05-17 DIAGNOSIS — E119 Type 2 diabetes mellitus without complications: Secondary | ICD-10-CM

## 2021-05-17 MED ORDER — METFORMIN HCL ER 750 MG PO TB24: 750.0000 mg | ORAL_TABLET | Freq: Two times a day (BID) | ORAL | 3 refills | Status: DC

## 2021-05-19 ENCOUNTER — Other Ambulatory Visit: Payer: Self-pay | Admitting: Family Medicine

## 2021-05-19 DIAGNOSIS — I10 Essential (primary) hypertension: Secondary | ICD-10-CM

## 2021-05-26 ENCOUNTER — Ambulatory Visit: Payer: Medicare Other | Admitting: Endocrinology

## 2021-05-26 ENCOUNTER — Encounter: Payer: Self-pay | Admitting: Endocrinology

## 2021-05-26 VITALS — BP 142/80 | HR 76 | Ht 69.0 in | Wt 199.4 lb

## 2021-05-26 DIAGNOSIS — E119 Type 2 diabetes mellitus without complications: Secondary | ICD-10-CM | POA: Diagnosis not present

## 2021-05-26 LAB — POCT GLYCOSYLATED HEMOGLOBIN (HGB A1C): Hemoglobin A1C: 6.9 % — AB (ref 4.0–5.6)

## 2021-05-26 NOTE — Patient Instructions (Addendum)
Please stay off the Ozempic, and continue the same other medications.   ?check your blood sugar once a day.  vary the time of day when you check, between before the 3 meals, and at bedtime.  also check if you have symptoms of your blood sugar being too high or too low.  please keep a record of the readings and bring it to your next appointment here (or you can bring the meter itself).  You can write it on any piece of paper.  please call us sooner if your blood sugar goes below 70, or if you have a lot of readings over 200.  ?You should have an endocrinology follow-up appointment in 3-4 months.   ?

## 2021-05-26 NOTE — Progress Notes (Signed)
? ?Subjective:  ? ? Patient ID: Bruce Romero, male    DOB: 07-May-1945, 76 y.o.   MRN: [id number] ? ?HPI ?Pt returns for f/u of diabetes mellitus:  ?DM type: 2 ?Dx'ed: 2006 ?Complications: PN and CRI ?Therapy: 2 oral meds.  ?DKA: never.  ?Severe hypoglycemia: never.   ?Pancreatitis: never.  ?Other: he has never been on insulin; he did not tolerate Invokana (yeast infection).   ?Interval history: He says cbg's vary from 104-200.  No recent steroids.  He stopped Ozempic, due to nausea.  Off it, sxs resolved.   ?Past Medical History:  ?Diagnosis Date  ? Acute medial meniscal tear, left, initial encounter   ? Anxiety   ? Diabetes mellitus   ? GERD (gastroesophageal reflux disease)   ? Hyperlipidemia   ? Hypertension   ? Irritable bowel   ? Spastic colon   ? anaphylaxis due to Keflex 1988  ? ? ?Past Surgical History:  ?Procedure Laterality Date  ? CHOLECYSTECTOMY    ? CHONDROPLASTY Left 01/15/2016  ? Procedure: CHONDROPLASTY;  Surgeon: Melrose Nakayama, MD;  Location: [facility];  Service: Orthopedics;  Laterality: Left;  ? KNEE ARTHROSCOPY WITH MEDIAL MENISECTOMY Left 01/15/2016  ? Procedure: ARTHROSCOPY LEFT KNEE WITH PARTIAL MEDIAL MENISECTOMY;  Surgeon: Melrose Nakayama, MD;  Location: [facility];  Service: Orthopedics;  Laterality: Left;  ? NASAL SINUS SURGERY    ? ? ?Social History  ? ?Socioeconomic History  ? Marital status: Married  ?  Spouse name: Not on file  ? Number of children: Not on file  ? Years of education: Not on file  ? Highest education level: Not on file  ?Occupational History  ? Occupation: retired  ?Tobacco Use  ? Smoking status: Former  ? Smokeless tobacco: Never  ?Substance and Sexual Activity  ? Alcohol use: Yes  ?  Alcohol/week: 0.0 standard drinks  ?  Comment: social  ? Drug use: No  ? Sexual activity: Yes  ?Other Topics Concern  ? Not on file  ?Social History Narrative  ? Exercise try to everyday.  ? ?Social Determinants of Health  ? ?Financial Resource Strain: Not  on file  ?Food Insecurity: Not on file  ?Transportation Needs: Not on file  ?Physical Activity: Not on file  ?Stress: Not on file  ?Social Connections: Not on file  ?Intimate Partner Violence: Not on file  ? ? ?Current Outpatient Medications on File Prior to Visit  ?Medication Sig Dispense Refill  ? albuterol (VENTOLIN HFA) 108 (90 Base) MCG/ACT inhaler Inhale 2 puffs into the lungs every 6 (six) hours as needed for wheezing or shortness of breath. 8 g 0  ? ALPRAZolam (XANAX) 0.25 MG tablet take 1 tablet by mouth three times a day if needed 30 tablet 0  ? amLODipine (NORVASC) 10 MG tablet TAKE 1 TABLET(10 MG) BY MOUTH DAILY 90 tablet 1  ? amoxicillin-clavulanate (AUGMENTIN) 875-125 MG tablet Take 1 tablet by mouth 2 (two) times daily. 20 tablet 0  ? atorvastatin (LIPITOR) 10 MG tablet TAKE 1 TABLET(10 MG) BY MOUTH DAILY 90 tablet 1  ? azelastine (ASTELIN) 0.1 % nasal spray Place 1 spray into both nostrils 2 (two) times daily. Use in each nostril as directed 30 mL 12  ? candesartan (ATACAND) 16 MG tablet TAKE 1 TABLET(16 MG) BY MOUTH AT BEDTIME 90 tablet 1  ? celecoxib (CELEBREX) 100 MG capsule Take 1 capsule (100 mg total) by mouth daily. 90 capsule 1  ? Cholecalciferol (VITAMIN D) 2000 UNITS  tablet Take 2,000 Units by mouth daily.    ? co-enzyme Q-10 30 MG capsule Take 100 mg by mouth daily.    ? esomeprazole (NEXIUM) 40 MG capsule Take 1 capsule (40 mg total) by mouth daily. 90 capsule 3  ? fluticasone (FLONASE) 50 MCG/ACT nasal spray Place into both nostrils daily.    ? icosapent Ethyl (VASCEPA) 1 g capsule TAKE 2 CAPSULES(2 GRAMS) BY MOUTH TWICE DAILY 360 capsule 1  ? Insulin Pen Needle 32G X 6 MM MISC Use as directed with insulin pen 30 each 3  ? linaclotide (LINZESS) 290 MCG CAPS capsule Take 1 capsule (290 mcg total) by mouth daily before breakfast. 90 capsule 3  ? linaclotide (LINZESS) 290 MCG CAPS capsule Take 1 capsule (290 mcg total) by mouth daily before breakfast. 90 capsule 3  ? meloxicam (MOBIC) 7.5  MG tablet Take 1 tablet (7.5 mg total) by mouth daily. 30 tablet 0  ? metFORMIN (GLUCOPHAGE-XR) 750 MG 24 hr tablet Take 1 tablet (750 mg total) by mouth 2 (two) times daily. 180 tablet 3  ? niacin (SLO-NIACIN) 500 MG tablet Take 500 mg by mouth at bedtime.    ? ofloxacin (FLOXIN OTIC) 0.3 % OTIC solution Place 10 drops into the right ear daily. 5 mL 0  ? ondansetron (ZOFRAN) 4 MG tablet Take 1 tablet (4 mg total) by mouth every 8 (eight) hours as needed for nausea or vomiting. 20 tablet 0  ? ONETOUCH VERIO test strip TEST ONCE DAILY 100 strip 12  ? repaglinide (PRANDIN) 0.5 MG tablet Take 1 tablet (0.5 mg total) by mouth 2 (two) times daily before a meal. 180 tablet 3  ? rOPINIRole (REQUIP) 1 MG tablet Take 1 tablet (1 mg total) by mouth at bedtime. 90 tablet 3  ? tamsulosin (FLOMAX) 0.4 MG CAPS capsule Take 1 capsule (0.4 mg total) by mouth daily. 90 capsule 3  ? traZODone (DESYREL) 50 MG tablet Take 0.5-1 tablets (25-50 mg total) by mouth at bedtime as needed for sleep. 30 tablet 3  ? ?No current facility-administered medications on file prior to visit.  ? ? ?Allergies  ?Allergen Reactions  ? Cephalexin Anaphylaxis  ?  REACTION: pt is not allergic to penicillin  ? Bactrim [Sulfamethoxazole-Trimethoprim] Other (See Comments)  ?  ? Took Bactrim and Flexeril at same time had lip swelling unsure which caused reaction.  ? Bromocriptine Nausea And Vomiting  ? Flexeril [Cyclobenzaprine] Swelling  ? Invokana [Canagliflozin]   ?  balanitis  ? ? ?Family History  ?Problem Relation Age of Onset  ? Stroke Mother   ? Pulmonary embolism Father   ? Diabetes Brother   ? ? ?BP (!) 142/80 (BP Location: Left Arm, Patient Position: Sitting, Cuff Size: Normal)   Pulse 76   Ht '5\' 9"'$  (1.753 m)   Wt 199 lb 6.4 oz (90.4 kg)   SpO2 99%   BMI 29.45 kg/m?  ? ? ?Review of Systems ?He denies hypoglycemia.  ?   ?Objective:  ? Physical Exam ?VITAL SIGNS:  See vs page ?GENERAL: no distress ? ?Lab Results  ?Component Value Date  ? CREATININE  0.91 04/08/2021  ? BUN 14 04/08/2021  ? NA 134 (L) 04/08/2021  ? K 4.2 04/08/2021  ? CL 96 04/08/2021  ? CO2 27 04/08/2021  ? ? ?A1c=6.9% ? ?   ?Assessment & Plan:  ?Type 2 DM ?Nausea, due to Ozempic ? ?Patient Instructions  ?Please stay off the Ozempic, and continue the same other medications.   ?  check your blood sugar once a day.  vary the time of day when you check, between before the 3 meals, and at bedtime.  also check if you have symptoms of your blood sugar being too high or too low.  please keep a record of the readings and bring it to your next appointment here (or you can bring the meter itself).  You can write it on any piece of paper.  please call us sooner if your blood sugar goes below 70, or if you have a lot of readings over 200.  ?You should have an endocrinology follow-up appointment in 3-4 months.   ? ? ?

## 2021-06-10 ENCOUNTER — Encounter: Payer: Self-pay | Admitting: Endocrinology

## 2021-06-10 ENCOUNTER — Other Ambulatory Visit: Payer: Self-pay | Admitting: Endocrinology

## 2021-06-10 MED ORDER — REPAGLINIDE 1 MG PO TABS: 1.0000 mg | ORAL_TABLET | Freq: Two times a day (BID) | ORAL | 1 refills | Status: DC

## 2021-07-06 ENCOUNTER — Telehealth: Payer: Self-pay

## 2021-07-06 MED ORDER — TRULICITY 1.5 MG/0.5ML ~~LOC~~ SOAJ: 1.5000 mg | SUBCUTANEOUS | 0 refills | Status: DC

## 2021-07-06 NOTE — Telephone Encounter (Signed)
Patient states that since Dr. Loanne Drilling stopped the Ozempic his blood sugars have been staying in the 200 range consistently. Patient has been taking the Repaglinide and Metformin as prescribed. Ozempic is causing him GI issues.  Patient was on Trulicity and states that it was changed because he was having trouble getting it. Patient would like to see about going back on Trulicity.   07/06/21 163 fasting at 8:45am  07/06/21  after breakfast at 10:59am 232

## 2021-07-06 NOTE — Telephone Encounter (Signed)
Medication has been sent per Dr. Dwyane Dee and patient is scheduled for follow up with Dr. Kelton Pillar

## 2021-08-23 ENCOUNTER — Other Ambulatory Visit: Payer: Self-pay | Admitting: Endocrinology

## 2021-08-24 ENCOUNTER — Encounter: Payer: Self-pay | Admitting: Medical

## 2021-08-24 ENCOUNTER — Ambulatory Visit (INDEPENDENT_AMBULATORY_CARE_PROVIDER_SITE_OTHER): Payer: Medicare Other | Admitting: Medical

## 2021-08-24 VITALS — BP 130/68 | HR 64 | Temp 98.2°F | Resp 18 | Ht 69.0 in | Wt 202.0 lb

## 2021-08-24 DIAGNOSIS — I10 Essential (primary) hypertension: Secondary | ICD-10-CM

## 2021-08-24 DIAGNOSIS — H6121 Impacted cerumen, right ear: Secondary | ICD-10-CM | POA: Diagnosis not present

## 2021-08-24 NOTE — Progress Notes (Signed)
Subjective:    Patient ID: Bruce Romero, male    DOB: 1945-08-27, 76 y.o.   MRN: [id number]  HPI Pt in for some ears feeling clogged. Rt side worse than left. No uri or nasal congestion reported.   No fever, no chills or sweats.   No ear pain.      Review of Systems  Constitutional:  Negative for chills, fatigue and fever.  HENT:  Negative for congestion and ear pain.        Ears feel clogged.  Cardiovascular:  Negative for chest pain and palpitations.  Gastrointestinal:  Negative for abdominal pain, blood in stool, nausea and rectal pain.  Musculoskeletal:  Negative for back pain, joint swelling, myalgias and neck pain.  Skin:  Negative for rash.  Neurological:  Negative for dizziness and headaches.  Hematological:  Negative for adenopathy. Does not bruise/bleed easily.  Psychiatric/Behavioral:  Negative for behavioral problems and confusion.     Past Medical History:  Diagnosis Date   Acute medial meniscal tear, left, initial encounter    Anxiety    Diabetes mellitus    GERD (gastroesophageal reflux disease)    Hyperlipidemia    Hypertension    Irritable bowel    Spastic colon    anaphylaxis due to Keflex 1988     Social History   Socioeconomic History   Marital status: Married    Spouse name: Not on file   Number of children: Not on file   Years of education: Not on file   Highest education level: Not on file  Occupational History   Occupation: retired  Tobacco Use   Smoking status: Former   Smokeless tobacco: Never  Substance and Sexual Activity   Alcohol use: Yes    Alcohol/week: 0.0 standard drinks of alcohol    Comment: social   Drug use: No   Sexual activity: Yes  Other Topics Concern   Not on file  Social History Narrative   Exercise try to everyday.   Social Determinants of Health   Financial Resource Strain: Not on file  Food Insecurity: Not on file  Transportation Needs: Not on file  Physical Activity: Not on file  Stress: Not on  file  Social Connections: Not on file  Intimate Partner Violence: Not on file    Past Surgical History:  Procedure Laterality Date   CHOLECYSTECTOMY     CHONDROPLASTY Left 01/15/2016   Procedure: CHONDROPLASTY;  Surgeon: Melrose Nakayama, MD;  Location: [facility];  Service: Orthopedics;  Laterality: Left;   KNEE ARTHROSCOPY WITH MEDIAL MENISECTOMY Left 01/15/2016   Procedure: ARTHROSCOPY LEFT KNEE WITH PARTIAL MEDIAL MENISECTOMY;  Surgeon: Melrose Nakayama, MD;  Location: [facility];  Service: Orthopedics;  Laterality: Left;   NASAL SINUS SURGERY      Family History  Problem Relation Age of Onset   Stroke Mother    Pulmonary embolism Father    Diabetes Brother     Allergies  Allergen Reactions   Cephalexin Anaphylaxis    REACTION: pt is not allergic to penicillin   Bactrim [Sulfamethoxazole-Trimethoprim] Other (See Comments)    ? Took Bactrim and Flexeril at same time had lip swelling unsure which caused reaction.   Bromocriptine Nausea And Vomiting   Flexeril [Cyclobenzaprine] Swelling   Invokana [Canagliflozin]     balanitis    Current Outpatient Medications on File Prior to Visit  Medication Sig Dispense Refill   albuterol (VENTOLIN HFA) 108 (90 Base) MCG/ACT inhaler Inhale 2 puffs into the  lungs every 6 (six) hours as needed for wheezing or shortness of breath. 8 g 0   ALPRAZolam (XANAX) 0.25 MG tablet take 1 tablet by mouth three times a day if needed 30 tablet 0   amLODipine (NORVASC) 10 MG tablet TAKE 1 TABLET(10 MG) BY MOUTH DAILY 90 tablet 1   amoxicillin-clavulanate (AUGMENTIN) 875-125 MG tablet Take 1 tablet by mouth 2 (two) times daily. 20 tablet 0   atorvastatin (LIPITOR) 10 MG tablet TAKE 1 TABLET(10 MG) BY MOUTH DAILY 90 tablet 1   azelastine (ASTELIN) 0.1 % nasal spray Place 1 spray into both nostrils 2 (two) times daily. Use in each nostril as directed 30 mL 12   candesartan (ATACAND) 16 MG tablet TAKE 1 TABLET(16 MG) BY MOUTH AT  BEDTIME 90 tablet 1   celecoxib (CELEBREX) 100 MG capsule Take 1 capsule (100 mg total) by mouth daily. 90 capsule 1   Cholecalciferol (VITAMIN D) 2000 UNITS tablet Take 2,000 Units by mouth daily.     co-enzyme Q-10 30 MG capsule Take 100 mg by mouth daily.     esomeprazole (NEXIUM) 40 MG capsule Take 1 capsule (40 mg total) by mouth daily. 90 capsule 3   fluticasone (FLONASE) 50 MCG/ACT nasal spray Place into both nostrils daily.     icosapent Ethyl (VASCEPA) 1 g capsule TAKE 2 CAPSULES(2 GRAMS) BY MOUTH TWICE DAILY 360 capsule 1   Insulin Pen Needle 32G X 6 MM MISC Use as directed with insulin pen 30 each 3   linaclotide (LINZESS) 290 MCG CAPS capsule Take 1 capsule (290 mcg total) by mouth daily before breakfast. 90 capsule 3   linaclotide (LINZESS) 290 MCG CAPS capsule Take 1 capsule (290 mcg total) by mouth daily before breakfast. 90 capsule 3   meloxicam (MOBIC) 7.5 MG tablet Take 1 tablet (7.5 mg total) by mouth daily. 30 tablet 0   metFORMIN (GLUCOPHAGE-XR) 750 MG 24 hr tablet Take 1 tablet (750 mg total) by mouth 2 (two) times daily. 180 tablet 3   niacin (SLO-NIACIN) 500 MG tablet Take 500 mg by mouth at bedtime.     ofloxacin (FLOXIN OTIC) 0.3 % OTIC solution Place 10 drops into the right ear daily. 5 mL 0   ondansetron (ZOFRAN) 4 MG tablet Take 1 tablet (4 mg total) by mouth every 8 (eight) hours as needed for nausea or vomiting. 20 tablet 0   ONETOUCH VERIO test strip TEST ONCE DAILY 100 strip 12   repaglinide (PRANDIN) 1 MG tablet Take 1 tablet (1 mg total) by mouth 2 (two) times daily before a meal. 180 tablet 1   rOPINIRole (REQUIP) 1 MG tablet Take 1 tablet (1 mg total) by mouth at bedtime. 90 tablet 3   tamsulosin (FLOMAX) 0.4 MG CAPS capsule Take 1 capsule (0.4 mg total) by mouth daily. 90 capsule 3   traZODone (DESYREL) 50 MG tablet Take 0.5-1 tablets (25-50 mg total) by mouth at bedtime as needed for sleep. 30 tablet 3   TRULICITY 1.5 ZT/2.4PY SOPN INJECT 1.'5MG'$  UNDER THE  SKIN ONCE A WEEK 6 mL 0   No current facility-administered medications on file prior to visit.    BP (!) 142/75   Pulse 64   Temp 98.2 F (36.8 C)   Resp 18   Ht '5\' 9"'$  (1.753 m)   Wt 202 lb (91.6 kg)   SpO2 98%   BMI 29.83 kg/m        Objective:   Physical Exam  General- No acute distress.  Pleasant patient. Neck- Full range of motion, no jvd Lungs- Clear, even and unlabored. Heart- regular rate and rhythm. Neurologic- CNII- XII grossly intact.       Assessment & Plan:   Patient Instructions  Right ear minimal cerumen present.  Prior to lavage procedure patient gave verbal consent.  Medical assistant did attempt lavage but no wax was removed.  I then used a curette and only got very small scant amount of dry appearing wax which was probably 50% of what was present in the upper portion of the canal.  On close inspection the wax is not deep and only scant.  I do not think this is causing of your recent symptoms.  I think you have some degree of eustachian tube dysfunction.  Would recommend using Flonase 2 sprays each nostril in the morning and can use Astelin 1 spray each nostril at night.  If you would give [place] an update in 7 to 10 days how ear feels.  If you have recurrent plugged sensation then could refer to ENT to remove remainder of small scant present wax but I do not think that the underlying problem presently.   Mackie Pai, PA-C

## 2021-08-24 NOTE — Patient Instructions (Signed)
Right ear minimal cerumen present.  Prior to lavage procedure patient gave verbal consent.  Medical assistant did attempt lavage but no wax was removed.  I then used a curette and only got very small scant amount of dry appearing wax which was probably 50% of what was present in the upper portion of the canal.  On close inspection the wax is not deep and only scant.  I do not think this is causing of your recent symptoms.  I think you have some degree of eustachian tube dysfunction.  Would recommend using Flonase 2 sprays each nostril in the morning and can use Astelin 1 spray each nostril at night.  If you would give [place] an update in 7 to 10 days how ear feels.  If you have recurrent plugged sensation then could refer to ENT to remove remainder of small scant present wax but I do not think that the underlying problem presently.

## 2021-08-25 ENCOUNTER — Other Ambulatory Visit: Payer: Self-pay | Admitting: Family Medicine

## 2021-08-25 DIAGNOSIS — I10 Essential (primary) hypertension: Secondary | ICD-10-CM

## 2021-09-17 ENCOUNTER — Other Ambulatory Visit: Payer: Self-pay

## 2021-09-17 MED ORDER — REPAGLINIDE 1 MG PO TABS: 1.0000 mg | ORAL_TABLET | Freq: Two times a day (BID) | ORAL | 0 refills | Status: DC

## 2021-09-23 ENCOUNTER — Other Ambulatory Visit: Payer: Self-pay

## 2021-09-23 NOTE — Patient Outreach (Signed)
  Care Coordination   Initial Visit Note   09/23/2021 Name: PATTRICK BADY MRN: [id number] DOB: [birth year]  ANGELINO RUMERY is a 76 y.o. year old male who sees Carollee Herter, Alferd Apa, DO for primary care. I spoke with  [name] by phone today  What matters to the patients health and wellness today?  Denies any care management, care coordination, disease management or resource needs.    Goals Addressed   None     SDOH assessments and interventions completed:  No     Care Coordination Interventions Activated:  No  Care Coordination Interventions:  No, not indicated   Follow up plan: No further intervention required.   Encounter Outcome:  Pt. Refused

## 2021-09-24 ENCOUNTER — Other Ambulatory Visit: Payer: Self-pay | Admitting: Family Medicine

## 2021-09-24 DIAGNOSIS — F411 Generalized anxiety disorder: Secondary | ICD-10-CM

## 2021-09-24 NOTE — Telephone Encounter (Signed)
Requesting:xanax 0.25 mg Contract:07/12/19 UDS:07/12/19 Last Visit:08/24/21 Next Visit:unknown Last Refill:03/23/21  Please Advise

## 2021-10-02 ENCOUNTER — Other Ambulatory Visit: Payer: Self-pay | Admitting: Family Medicine

## 2021-10-02 DIAGNOSIS — E781 Pure hyperglyceridemia: Secondary | ICD-10-CM

## 2021-10-07 ENCOUNTER — Other Ambulatory Visit: Payer: Self-pay | Admitting: Family Medicine

## 2021-10-07 DIAGNOSIS — E781 Pure hyperglyceridemia: Secondary | ICD-10-CM

## 2021-10-12 DIAGNOSIS — L578 Other skin changes due to chronic exposure to nonionizing radiation: Secondary | ICD-10-CM | POA: Diagnosis not present

## 2021-10-12 DIAGNOSIS — C4402 Squamous cell carcinoma of skin of lip: Secondary | ICD-10-CM | POA: Diagnosis not present

## 2021-10-12 DIAGNOSIS — D492 Neoplasm of unspecified behavior of bone, soft tissue, and skin: Secondary | ICD-10-CM | POA: Diagnosis not present

## 2021-10-12 NOTE — Progress Notes (Signed)
Name: Bruce Romero  Age/ Sex: 76 y.o., male   MRN/ DOB: [id number], 1945/04/30     PCP: Carollee Herter, Alferd Apa, DO   Reason for Endocrinology Evaluation: Type 2 Diabetes Mellitus  Initial Endocrine Consultative Visit: 04/26/2016    PATIENT IDENTIFIER: Bruce Romero is a 76 y.o. male with a past medical history of T2DM, HTN, fibromyalgia. The patient has followed with Endocrinology clinic since 04/26/2016 for consultative assistance with management of his diabetes.  DIABETIC HISTORY:  Bruce Romero was diagnosed with DM 2006, intolerant to Invokana-balanitis, intolerant to Ozempic-GI side effects. His hemoglobin A1c has ranged from 6.2% in 2018, peaking at 7.8% in 2023.   He was followed by Dr. [name] between 2018 and April 2023  SUBJECTIVE:   During the last visit (05/26/2021): Saw Dr. [name]  Today (10/13/2021): Bruce Romero is here for follow-up on diabetes management He checks his blood sugars 2-3 times week. The patient has not had hypoglycemic episodes since the last clinic visit   He eats 2-3 meals a day   He didn't qualify for pt assistance  Takes repaglinide with Breakfast and Supper  He is paying $300 for a month supply of Trulicity  Denies nausea, vomiting or diarrhea    HOME DIABETES REGIMEN:  Metformin 750 XR twice daily Repaglinide 1 mg twice daily Trulicity 1.5 mg weekly      Statin: Yes ACE-I/ARB :yes     METER DOWNLOAD SUMMARY: Did not bring     DIABETIC COMPLICATIONS: Microvascular complications:   Denies: CKD, retinopathy, neuropathy Last Eye Exam: Completed 03/2021  Macrovascular complications:   Denies: CAD, CVA, PVD   HISTORY:  Past Medical History:  Past Medical History:  Diagnosis Date   Acute medial meniscal tear, left, initial encounter    Anxiety    Diabetes mellitus    GERD (gastroesophageal reflux disease)    Hyperlipidemia    Hypertension    Irritable bowel    Spastic colon    anaphylaxis due to [place]    Past Surgical History:  Past Surgical History:  Procedure Laterality Date   CHOLECYSTECTOMY     CHONDROPLASTY Left 01/15/2016   Procedure: CHONDROPLASTY;  Surgeon: Melrose Nakayama, MD;  Location: [place];  Service: Orthopedics;  Laterality: Left;   KNEE ARTHROSCOPY WITH MEDIAL MENISECTOMY Left 01/15/2016   Procedure: ARTHROSCOPY LEFT KNEE WITH PARTIAL MEDIAL MENISECTOMY;  Surgeon: Melrose Nakayama, MD;  Location: [place];  Service: Orthopedics;  Laterality: Left;   NASAL SINUS SURGERY     Social History:  reports that he has quit smoking. He has never used smokeless tobacco. He reports current alcohol use. He reports that he does not use drugs. Family History:  Family History  Problem Relation Age of Onset   Stroke Mother    Pulmonary embolism Father    Diabetes Brother      HOME MEDICATIONS: Allergies as of 10/13/2021       Reactions   Cephalexin Anaphylaxis   REACTION: pt is not allergic to penicillin   Bactrim [sulfamethoxazole-trimethoprim] Other (See Comments)   ? Took Bactrim and Flexeril at same time had lip swelling unsure which caused reaction.   Bromocriptine Nausea And Vomiting   Flexeril [cyclobenzaprine] Swelling   Invokana [canagliflozin]    balanitis        Medication List        Accurate as of October 13, 2021 12:26 PM. If you have any questions, ask your nurse or doctor.  albuterol 108 (90 Base) MCG/ACT inhaler Commonly known as: VENTOLIN HFA Inhale 2 puffs into the lungs every 6 (six) hours as needed for wheezing or shortness of breath.   ALPRAZolam 0.25 MG tablet Commonly known as: XANAX TAKE 1 TABLET BY MOUTH THREE TIMES DAILY AS NEEDED   amLODipine 10 MG tablet Commonly known as: NORVASC TAKE 1 TABLET(10 MG) BY MOUTH DAILY   amoxicillin-clavulanate 875-125 MG tablet Commonly known as: Augmentin Take 1 tablet by mouth 2 (two) times daily.   atorvastatin 10 MG tablet Commonly known as:  LIPITOR TAKE 1 TABLET(10 MG) BY MOUTH DAILY   azelastine 0.1 % nasal spray Commonly known as: ASTELIN Place 1 spray into both nostrils 2 (two) times daily. Use in each nostril as directed   candesartan 16 MG tablet Commonly known as: ATACAND TAKE 1 TABLET(16 MG) BY MOUTH AT BEDTIME   celecoxib 100 MG capsule Commonly known as: CELEBREX TAKE 1 CAPSULE(100 MG) BY MOUTH DAILY   co-enzyme Q-10 30 MG capsule Take 100 mg by mouth daily.   esomeprazole 40 MG capsule Commonly known as: NexIUM Take 1 capsule (40 mg total) by mouth daily.   fluticasone 50 MCG/ACT nasal spray Commonly known as: FLONASE Place into both nostrils daily.   icosapent Ethyl 1 g capsule Commonly known as: VASCEPA Take 2 capsules (2 g total) by mouth 2 (two) times daily.   Insulin Pen Needle 32G X 6 MM Misc Use as directed with insulin pen   linaclotide 290 MCG Caps capsule Commonly known as: Linzess Take 1 capsule (290 mcg total) by mouth daily before breakfast.   linaclotide 290 MCG Caps capsule Commonly known as: Linzess Take 1 capsule (290 mcg total) by mouth daily before breakfast.   meloxicam 7.5 MG tablet Commonly known as: MOBIC Take 1 tablet (7.5 mg total) by mouth daily.   metFORMIN 750 MG 24 hr tablet Commonly known as: GLUCOPHAGE-XR Take 1 tablet (750 mg total) by mouth 2 (two) times daily.   niacin 500 MG tablet Commonly known as: (VITAMIN B3) Take 500 mg by mouth at bedtime.   ofloxacin 0.3 % OTIC solution Commonly known as: Floxin Otic Place 10 drops into the right ear daily.   ondansetron 4 MG tablet Commonly known as: Zofran Take 1 tablet (4 mg total) by mouth every 8 (eight) hours as needed for nausea or vomiting.   OneTouch Verio test strip Generic drug: glucose blood TEST ONCE DAILY   repaglinide 1 MG tablet Commonly known as: PRANDIN Take 1 tablet (1 mg total) by mouth 2 (two) times daily before a meal.   rOPINIRole 1 MG tablet Commonly known as: Requip Take 1  tablet (1 mg total) by mouth at bedtime.   tamsulosin 0.4 MG Caps capsule Commonly known as: FLOMAX Take 1 capsule (0.4 mg total) by mouth daily.   traZODone 50 MG tablet Commonly known as: DESYREL Take 0.5-1 tablets (25-50 mg total) by mouth at bedtime as needed for sleep.   Trulicity 1.5 OV/7.8HY Sopn Generic drug: Dulaglutide INJECT 1.'5MG'$  UNDER THE SKIN ONCE A WEEK   Vitamin D 50 MCG (2000 UT) tablet Take 2,000 Units by mouth daily.         OBJECTIVE:   Vital Signs: BP 138/72 (BP Location: Left Arm, Patient Position: Sitting, Cuff Size: Normal)   Pulse 62   Ht '5\' 9"'$  (1.753 m)   Wt 205 lb 6.4 oz (93.2 kg)   SpO2 97%   BMI 30.33 kg/m   Wt Readings from Last  3 Encounters:  10/13/21 205 lb 6.4 oz (93.2 kg)  08/24/21 202 lb (91.6 kg)  05/26/21 199 lb 6.4 oz (90.4 kg)     Exam: General: Pt appears well and is in NAD  Neck: General: Supple without adenopathy. Thyroid: Thyroid size normal.  No goiter or nodules appreciated.   Lungs: Clear with good BS bilat   Heart: RRR   Abdomen:  soft, nontender  Extremities: No pretibial edema.   Neuro: MS is good with appropriate affect, pt is alert and Ox3    DM foot exam: 10/13/2021  The skin of the feet is intact without sores or ulcerations. The pedal pulses are 2+ on right and 2+ on left. The sensation is intact to a screening 5.07, 10 gram monofilament bilaterally        DATA REVIEWED:  Lab Results  Component Value Date   HGBA1C 6.9 (A) 10/13/2021   HGBA1C 6.9 (A) 05/26/2021   HGBA1C 7.8 (A) 02/17/2021   Lab Results  Component Value Date   MICROALBUR <0.7 06/27/2016   LDLCALC 34 04/08/2021   CREATININE 0.91 04/08/2021   Lab Results  Component Value Date   MICRALBCREAT 0.6 06/27/2016     Lab Results  Component Value Date   CHOL 99 04/08/2021   HDL 46.30 04/08/2021   LDLCALC 34 04/08/2021   LDLDIRECT 74.0 03/30/2018   TRIG 91.0 04/08/2021   CHOLHDL 2 04/08/2021         ASSESSMENT / PLAN /  RECOMMENDATIONS:   1) Type 2 Diabetes Mellitus, Optimally controlled, Without complications - Most recent A1c of 6.9 %. Goal A1c < 7.0 %.     -Glycemic control remains at goal - Intolerant to Ozempic 2 mg weekly , tolerating current dose of Trulicity without side effects, he is currently in the donut hole and the cost for Trulicity pen is around $300.  Per patient he did not qualify for patient assistance program in the past.  He was given the option between discontinuing Trulicity and increasing repaglinide, patient opted to continue on Trulicity due to benefits, he was provided with a pharmacy discount card to see if that would work -No changes at this time    MEDICATIONS: Continue metformin 750 mg XR, twice daily Continue repaglinide 1 mg twice daily Continue Trulicity 1.5 mg weekly  EDUCATION / INSTRUCTIONS: BG monitoring instructions: Patient is instructed to check his blood sugars 1 times a day, fasting . Call [facility] if: BG persistently < 70  I reviewed the Rule of 15 for the treatment of hypoglycemia in detail with the patient. Literature supplied.    2) Diabetic complications:  Eye: Does not have known diabetic retinopathy.  Neuro/ Feet: Does not have known diabetic peripheral neuropathy .  Renal: Patient does not have known baseline CKD. He   is  on an ACEI/ARB at present.       F/U in 6 months   Signed electronically by: Mack Guise, MD  [facility] Endocrinology  [facility] [address] [place], [place] [address] Phone: [phone] FAX: [phone]   CC: [address] [place] [address] Phone: [phone]  Fax: [phone]  Return to Endocrinology clinic as below: Future Appointments  Date Time Provider [place]  10/13/2021 12:30 PM Viviano Bir, Melanie Crazier, MD [id number] None

## 2021-10-13 ENCOUNTER — Ambulatory Visit: Payer: Medicare Other | Admitting: Internal Medicine

## 2021-10-13 ENCOUNTER — Encounter: Payer: Self-pay | Admitting: Internal Medicine

## 2021-10-13 VITALS — BP 138/72 | HR 62 | Ht 69.0 in | Wt 205.4 lb

## 2021-10-13 DIAGNOSIS — E119 Type 2 diabetes mellitus without complications: Secondary | ICD-10-CM

## 2021-10-13 LAB — POCT GLYCOSYLATED HEMOGLOBIN (HGB A1C): Hemoglobin A1C: 6.9 % — AB (ref 4.0–5.6)

## 2021-10-13 LAB — POCT GLUCOSE (DEVICE FOR HOME USE): POC Glucose: 130 mg/dl — AB (ref 70–99)

## 2021-10-13 MED ORDER — TRULICITY 1.5 MG/0.5ML ~~LOC~~ SOAJ
1.5000 mg | SUBCUTANEOUS | 11 refills | Status: DC
Start: 2021-10-13 — End: 2022-01-17

## 2021-10-13 MED ORDER — METFORMIN HCL ER 750 MG PO TB24: 750.0000 mg | ORAL_TABLET | Freq: Two times a day (BID) | ORAL | 3 refills | Status: DC

## 2021-10-13 MED ORDER — REPAGLINIDE 1 MG PO TABS: 1.0000 mg | ORAL_TABLET | Freq: Two times a day (BID) | ORAL | 3 refills | Status: DC

## 2021-10-13 NOTE — Patient Instructions (Addendum)
Continue Metformin 750 XR twice daily Continue Repaglinide 1 mg twice daily Continue Trulicity 1.5 mg weekly    HOW TO TREAT LOW BLOOD SUGARS (Blood sugar LESS THAN 70 MG/DL) Please follow the RULE OF 15 for the treatment of hypoglycemia treatment (when your (blood sugars are less than 70 mg/dL)   STEP 1: Take 15 grams of carbohydrates when your blood sugar is low, which includes:  3-4 GLUCOSE TABS  OR 3-4 OZ OF JUICE OR REGULAR SODA OR ONE TUBE OF GLUCOSE GEL    STEP 2: RECHECK blood sugar in 15 MINUTES STEP 3: If your blood sugar is still low at the 15 minute recheck --> then, go back to STEP 1 and treat AGAIN with another 15 grams of carbohydrates.

## 2021-10-14 ENCOUNTER — Encounter: Payer: Self-pay | Admitting: Internal Medicine

## 2021-10-15 MED ORDER — TIRZEPATIDE 5 MG/0.5ML ~~LOC~~ SOAJ: 5.0000 mg | SUBCUTANEOUS | 3 refills | Status: DC

## 2021-10-18 DIAGNOSIS — D492 Neoplasm of unspecified behavior of bone, soft tissue, and skin: Secondary | ICD-10-CM | POA: Diagnosis not present

## 2021-11-05 ENCOUNTER — Ambulatory Visit: Payer: Medicare Other | Admitting: Internal Medicine

## 2021-11-10 ENCOUNTER — Telehealth (INDEPENDENT_AMBULATORY_CARE_PROVIDER_SITE_OTHER): Payer: Medicare Other | Admitting: Family Medicine

## 2021-11-10 ENCOUNTER — Encounter: Payer: Self-pay | Admitting: Family Medicine

## 2021-11-10 DIAGNOSIS — J0100 Acute maxillary sinusitis, unspecified: Secondary | ICD-10-CM | POA: Diagnosis not present

## 2021-11-10 MED ORDER — DOXYCYCLINE HYCLATE 100 MG PO TABS: 100.0000 mg | ORAL_TABLET | Freq: Two times a day (BID) | ORAL | 0 refills | Status: AC

## 2021-11-10 MED ORDER — PREDNISONE 20 MG PO TABS: 40.0000 mg | ORAL_TABLET | Freq: Every day | ORAL | 0 refills | Status: AC

## 2021-11-10 NOTE — Progress Notes (Signed)
Chief Complaint  Patient presents with   Sinusitis    Bruce Romero here for URI complaints. Due to COVID-19 pandemic, we are interacting via web portal for an electronic face-to-face visit. I verified patient's ID using 2 identifiers. Patient agreed to proceed with visit via this method. Patient is inside a short term rental, I am at office. Patient and I are present for visit.    Duration: 2 days  Associated symptoms: sinus congestion, sinus pain, rhinorrhea, and ear fullness Denies: itchy watery eyes, ear pain, ear drainage, sore throat, wheezing, shortness of breath, myalgia, and fevers, coughing Treatment to date: Flonase, sinus lavage, Astelin Sick contacts: No  Past Medical History:  Diagnosis Date   Acute medial meniscal tear, left, initial encounter    Anxiety    Diabetes mellitus    GERD (gastroesophageal reflux disease)    Hyperlipidemia    Hypertension    Irritable bowel    Spastic colon    anaphylaxis due to Kef[place] 1988    Objective No conversational dyspnea Age appropriate judgment and insight Nml affect and mood  Acute maxillary sinusitis, recurrence not specified - Plan: predniSONE (DELTASONE) 20 MG tablet, doxycycline (VIBRA-TABS) 100 MG tablet  5 d pred burst 40 mg/d. 2 mg bid of Prandin while on this. Doxy for 7 d if no better in the next 3-4 d. Continue to push fluids, practice good hand hygiene, cover mouth when coughing. F/u prn. If starting to experience fevers, shaking, or shortness of breath, seek immediate care. Pt voiced understanding and agreement to the plan.  [place], DO 11/10/21 2:01 PM

## 2021-11-24 ENCOUNTER — Other Ambulatory Visit: Payer: Self-pay | Admitting: Family Medicine

## 2021-11-24 DIAGNOSIS — E781 Pure hyperglyceridemia: Secondary | ICD-10-CM

## 2021-12-02 DIAGNOSIS — C001 Malignant neoplasm of external lower lip: Secondary | ICD-10-CM | POA: Diagnosis not present

## 2021-12-03 ENCOUNTER — Other Ambulatory Visit: Payer: Self-pay | Admitting: Family Medicine

## 2021-12-03 DIAGNOSIS — I10 Essential (primary) hypertension: Secondary | ICD-10-CM

## 2021-12-09 ENCOUNTER — Encounter: Payer: Self-pay | Admitting: Family Medicine

## 2022-01-05 DIAGNOSIS — L570 Actinic keratosis: Secondary | ICD-10-CM | POA: Diagnosis not present

## 2022-01-05 DIAGNOSIS — D0001 Carcinoma in situ of labial mucosa and vermilion border: Secondary | ICD-10-CM | POA: Diagnosis not present

## 2022-01-05 DIAGNOSIS — L578 Other skin changes due to chronic exposure to nonionizing radiation: Secondary | ICD-10-CM | POA: Diagnosis not present

## 2022-01-11 ENCOUNTER — Telehealth: Payer: Self-pay | Admitting: Family Medicine

## 2022-01-11 MED ORDER — ATORVASTATIN CALCIUM 10 MG PO TABS: ORAL_TABLET | ORAL | 0 refills | Status: DC

## 2022-01-11 NOTE — Telephone Encounter (Signed)
Prescription Request  01/11/2022  Is this a "Controlled Substance" medicine? No  LOV: 11/10/21  What is the name of the medication or equipment?   atorvastatin (LIPITOR) 10 MG tablet [[id number]]   Have you contacted your pharmacy to request a refill? Yes   Which pharmacy would you like this sent to?  [id number] [address], [address] AT [facility] OF [address] [place] [address] Phone: [phone] Fax: [phone]    Patient notified that their request is being sent to the clinical staff for review and that they should receive a response within 2 business days.   Please advise at Mobile [phone] (mobile)

## 2022-01-11 NOTE — Telephone Encounter (Signed)
Refills sent

## 2022-01-11 NOTE — Progress Notes (Signed)
[facility] Quality Team Note  Name: Bruce Romero Date of Birth: November 23, 1945 MRN: [id number] Date: 01/11/2022  [facility] Quality Team has reviewed this patient's chart, please see recommendations below:  [facility] Quality Other; (KED: [place] URINE MICROALBUMIN/CREATININE RATIO COMPLETED FOR GAP CLOSURE.)

## 2022-01-17 ENCOUNTER — Ambulatory Visit (INDEPENDENT_AMBULATORY_CARE_PROVIDER_SITE_OTHER): Payer: Medicare Other | Admitting: Family Medicine

## 2022-01-17 ENCOUNTER — Encounter: Payer: Self-pay | Admitting: Family Medicine

## 2022-01-17 VITALS — BP 110/68 | HR 81 | Temp 97.8°F | Resp 18 | Ht 69.0 in | Wt 198.2 lb

## 2022-01-17 DIAGNOSIS — I10 Essential (primary) hypertension: Secondary | ICD-10-CM | POA: Diagnosis not present

## 2022-01-17 DIAGNOSIS — E119 Type 2 diabetes mellitus without complications: Secondary | ICD-10-CM

## 2022-01-17 DIAGNOSIS — E785 Hyperlipidemia, unspecified: Secondary | ICD-10-CM | POA: Diagnosis not present

## 2022-01-17 DIAGNOSIS — E1169 Type 2 diabetes mellitus with other specified complication: Secondary | ICD-10-CM

## 2022-01-17 DIAGNOSIS — M1991 Primary osteoarthritis, unspecified site: Secondary | ICD-10-CM

## 2022-01-17 DIAGNOSIS — F411 Generalized anxiety disorder: Secondary | ICD-10-CM | POA: Diagnosis not present

## 2022-01-17 LAB — CBC WITH DIFFERENTIAL/PLATELET
Basophils Absolute: 0.1 10*3/uL (ref 0.0–0.1)
Basophils Relative: 0.8 % (ref 0.0–3.0)
Eosinophils Absolute: 0.5 10*3/uL (ref 0.0–0.7)
Eosinophils Relative: 5.7 % — ABNORMAL HIGH (ref 0.0–5.0)
HCT: 39.4 % (ref 39.0–52.0)
Hemoglobin: 13.5 g/dL (ref 13.0–17.0)
Lymphocytes Relative: 31.1 % (ref 12.0–46.0)
Lymphs Abs: 2.6 10*3/uL (ref 0.7–4.0)
MCHC: 34.2 g/dL (ref 30.0–36.0)
MCV: 83.7 fl (ref 78.0–100.0)
Monocytes Absolute: 0.6 10*3/uL (ref 0.1–1.0)
Monocytes Relative: 7.7 % (ref 3.0–12.0)
Neutro Abs: 4.5 10*3/uL (ref 1.4–7.7)
Neutrophils Relative %: 54.7 % (ref 43.0–77.0)
Platelets: 224 10*3/uL (ref 150.0–400.0)
RBC: 4.7 Mil/uL (ref 4.22–5.81)
RDW: 15.3 % (ref 11.5–15.5)
WBC: 8.2 10*3/uL (ref 4.0–10.5)

## 2022-01-17 LAB — COMPREHENSIVE METABOLIC PANEL
ALT: 25 U/L (ref 0–53)
AST: 33 U/L (ref 0–37)
Albumin: 4.5 g/dL (ref 3.5–5.2)
Alkaline Phosphatase: 47 U/L (ref 39–117)
BUN: 11 mg/dL (ref 6–23)
CO2: 26 mEq/L (ref 19–32)
Calcium: 8.6 mg/dL (ref 8.4–10.5)
Chloride: 99 mEq/L (ref 96–112)
Creatinine, Ser: 0.96 mg/dL (ref 0.40–1.50)
GFR: 76.27 mL/min (ref >60.00)
Glucose, Bld: 141 mg/dL — ABNORMAL HIGH (ref 70–99)
Potassium: 3.9 mEq/L (ref 3.5–5.1)
Sodium: 135 mEq/L (ref 135–145)
Total Bilirubin: 0.6 mg/dL (ref 0.2–1.2)
Total Protein: 6.9 g/dL (ref 6.0–8.3)

## 2022-01-17 LAB — LIPID PANEL
Cholesterol: 106 mg/dL (ref 0–200)
HDL: 53.2 mg/dL (ref >39.00)
LDL Cholesterol: 32 mg/dL (ref 0–99)
NonHDL: 52.9
Total CHOL/HDL Ratio: 2
Triglycerides: 104 mg/dL (ref 0.0–149.0)
VLDL: 20.8 mg/dL (ref 0.0–40.0)

## 2022-01-17 LAB — MICROALBUMIN / CREATININE URINE RATIO
Creatinine,U: 178.2 mg/dL
Microalb Creat Ratio: 0.7 mg/g (ref 0.0–30.0)
Microalb, Ur: 1.2 mg/dL (ref 0.0–1.9)

## 2022-01-17 MED ORDER — CELECOXIB 200 MG PO CAPS: 200.0000 mg | ORAL_CAPSULE | Freq: Two times a day (BID) | ORAL | 3 refills | Status: DC

## 2022-01-17 MED ORDER — ALPRAZOLAM 0.25 MG PO TABS: ORAL_TABLET | ORAL | 1 refills | Status: DC

## 2022-01-17 NOTE — Progress Notes (Signed)
Subjective:   By signing my name below, I, Bruce Romero, attest that this documentation has been prepared under the direction and in the presence of Bruce Held DO 01/17/2022   Patient ID: Bruce Romero, male    DOB: 1945/07/19, 76 y.o.   MRN: [id number]  No chief complaint on file.   HPI Patient is in today for an office visit  He is requesting a refill of 100 mg of Celebrex and 0.25 mg of Xanax.   He states that he is doubling his dosage of his 100 mg of Celebrex.   He is currently taking 50 mg of Trazodone about an hour before bedtime. He reports of side effects of dry mouth. He is requesting if there is another medication that would not give him these side effects.   He reports of a panic attack a few weeks ago. He states that he had to take 0.75 mg of Xanax over the span of 2-3 hours to bring his heart rate down. He states that he begun to feel symptoms once he got into the restroom to which he proceeded to take the medication. He denies of any significant stress and states that overall he feels fine.   He is currently taking 5 mg/0.5 mL of Mounjaro and denies of any significant side effects.  Wt Readings from Last 3 Encounters:  01/17/22 198 lb 3.2 oz (89.9 kg)  10/13/21 205 lb 6.4 oz (93.2 kg)  08/24/21 202 lb (91.6 kg)   He is UTD on the influenza vaccine.   Lab Results  Component Value Date   HGBA1C 6.9 (A) 10/13/2021    Past Medical History:  Diagnosis Date  . Acute medial meniscal tear, left, initial encounter   . Anxiety   . Diabetes mellitus   . GERD (gastroesophageal reflux disease)   . Hyperlipidemia   . Hypertension   . Irritable bowel   . Spastic colon    anaphylaxis due to [place]    Past Surgical History:  Procedure Laterality Date  . CHOLECYSTECTOMY    . CHONDROPLASTY Left 01/15/2016   Procedure: CHONDROPLASTY;  Surgeon: Melrose Nakayama, MD;  Location: [place];  Service: Orthopedics;  Laterality: Left;  . KNEE  ARTHROSCOPY WITH MEDIAL MENISECTOMY Left 01/15/2016   Procedure: ARTHROSCOPY LEFT KNEE WITH PARTIAL MEDIAL MENISECTOMY;  Surgeon: Melrose Nakayama, MD;  Location: [place];  Service: Orthopedics;  Laterality: Left;  . NASAL SINUS SURGERY      Family History  Problem Relation Age of Onset  . Stroke Mother   . Pulmonary embolism Father   . Diabetes Brother     Social History   Socioeconomic History  . Marital status: Married    Spouse name: Not on file  . Number of children: Not on file  . Years of education: Not on file  . Highest education level: Not on file  Occupational History  . Occupation: retired  Tobacco Use  . Smoking status: Former  . Smokeless tobacco: Never  Substance and Sexual Activity  . Alcohol use: Yes    Alcohol/week: 0.0 standard drinks of alcohol    Comment: social  . Drug use: No  . Sexual activity: Yes  Other Topics Concern  . Not on file  Social History Narrative   Exercise try to everyday.   Social Determinants of Health   Financial Resource Strain: Not on file  Food Insecurity: Not on file  Transportation Needs: Not on file  Physical Activity:  Not on file  Stress: Not on file  Social Connections: Not on file  Intimate Partner Violence: Not on file    Outpatient Medications Prior to Visit  Medication Sig Dispense Refill  . albuterol (VENTOLIN HFA) 108 (90 Base) MCG/ACT inhaler Inhale 2 puffs into the lungs every 6 (six) hours as needed for wheezing or shortness of breath. 8 g 0  . ALPRAZolam (XANAX) 0.25 MG tablet TAKE 1 TABLET BY MOUTH THREE TIMES DAILY AS NEEDED 30 tablet 1  . amLODipine (NORVASC) 10 MG tablet TAKE 1 TABLET(10 MG) BY MOUTH DAILY 90 tablet 1  . atorvastatin (LIPITOR) 10 MG tablet Take 1 tablet by mouth daily. Pt needs appointment for further refills 30 tablet 0  . azelastine (ASTELIN) 0.1 % nasal spray Place 1 spray into both nostrils 2 (two) times daily. Use in each nostril as directed 30 mL 12  .  candesartan (ATACAND) 16 MG tablet Take 1 tablet (16 mg total) by mouth at bedtime. Pt needs office visit for further refills 30 tablet 0  . celecoxib (CELEBREX) 100 MG capsule TAKE 1 CAPSULE(100 MG) BY MOUTH DAILY 90 capsule 1  . Cholecalciferol (VITAMIN D) 2000 UNITS tablet Take 2,000 Units by mouth daily.    Marland Kitchen co-enzyme Q-10 30 MG capsule Take 100 mg by mouth daily.    . Dulaglutide (TRULICITY) 1.5 PJ/8.2NK SOPN 1.5 mg by Other route once a week. 2 mL 11  . esomeprazole (NEXIUM) 40 MG capsule Take 1 capsule (40 mg total) by mouth daily. 90 capsule 3  . fluticasone (FLONASE) 50 MCG/ACT nasal spray Place into both nostrils daily.    Marland Kitchen icosapent Ethyl (VASCEPA) 1 g capsule TAKE 2 CAPSULES(2 GRAMS) BY MOUTH TWICE DAILY 360 capsule 1  . Insulin Pen Needle 32G X 6 MM MISC Use as directed with insulin pen 30 each 3  . linaclotide (LINZESS) 290 MCG CAPS capsule Take 1 capsule (290 mcg total) by mouth daily before breakfast. 90 capsule 3  . linaclotide (LINZESS) 290 MCG CAPS capsule Take 1 capsule (290 mcg total) by mouth daily before breakfast. 90 capsule 3  . meloxicam (MOBIC) 7.5 MG tablet Take 1 tablet (7.5 mg total) by mouth daily. 30 tablet 0  . metFORMIN (GLUCOPHAGE-XR) 750 MG 24 hr tablet Take 1 tablet (750 mg total) by mouth 2 (two) times daily. 180 tablet 3  . niacin (SLO-NIACIN) 500 MG tablet Take 500 mg by mouth at bedtime.    Marland Kitchen ofloxacin (FLOXIN OTIC) 0.3 % OTIC solution Place 10 drops into the right ear daily. 5 mL 0  . ondansetron (ZOFRAN) 4 MG tablet Take 1 tablet (4 mg total) by mouth every 8 (eight) hours as needed for nausea or vomiting. 20 tablet 0  . ONETOUCH VERIO test strip TEST ONCE DAILY 100 strip 12  . repaglinide (PRANDIN) 1 MG tablet Take 1 tablet (1 mg total) by mouth 2 (two) times daily before a meal. 180 tablet 3  . rOPINIRole (REQUIP) 1 MG tablet Take 1 tablet (1 mg total) by mouth at bedtime. 90 tablet 3  . tamsulosin (FLOMAX) 0.4 MG CAPS capsule Take 1 capsule (0.4 mg  total) by mouth daily. 90 capsule 3  . tirzepatide (MOUNJARO) 5 MG/0.5ML Pen Inject 5 mg into the skin once a week. 6 mL 3  . traZODone (DESYREL) 50 MG tablet Take 0.5-1 tablets (25-50 mg total) by mouth at bedtime as needed for sleep. 30 tablet 3   No facility-administered medications prior to visit.  Allergies  Allergen Reactions  . Cephalexin Anaphylaxis    REACTION: pt is not allergic to penicillin  . Bactrim [Sulfamethoxazole-Trimethoprim] Other (See Comments)    ? Took Bactrim and Flexeril at same time had lip swelling unsure which caused reaction.  . Bromocriptine Nausea And Vomiting  . Flexeril [Cyclobenzaprine] Swelling  . Invokana [Canagliflozin]     balanitis    Review of Systems  Constitutional:  Negative for fever and malaise/fatigue.  HENT:  Negative for congestion.   Eyes:  Negative for blurred vision.  Respiratory:  Negative for cough and shortness of breath.   Cardiovascular:  Negative for chest pain, palpitations and leg swelling.  Gastrointestinal:  Negative for vomiting.  Musculoskeletal:  Negative for back pain.  Skin:  Negative for rash.  Neurological:  Negative for loss of consciousness and headaches.      Objective:    Physical Exam Vitals and nursing note reviewed.  Constitutional:      General: He is not in acute distress.    Appearance: Normal appearance. He is not ill-appearing.  HENT:     Head: Normocephalic and atraumatic.     Right Ear: External ear normal.     Left Ear: External ear normal.  Eyes:     Extraocular Movements: Extraocular movements intact.     Pupils: Pupils are equal, round, and reactive to light.  Cardiovascular:     Rate and Rhythm: Normal rate and regular rhythm.     Heart sounds: Normal heart sounds. No murmur heard.    No gallop.  Pulmonary:     Effort: Pulmonary effort is normal. No respiratory distress.     Breath sounds: Normal breath sounds. No wheezing or rales.  Skin:    General: Skin is warm and dry.   Neurological:     Mental Status: He is alert and oriented to person, place, and time.  Psychiatric:        Mood and Affect: Mood normal.        Behavior: Behavior normal.        Judgment: Judgment normal.    There were no vitals taken for this visit. Wt Readings from Last 3 Encounters:  10/13/21 205 lb 6.4 oz (93.2 kg)  08/24/21 202 lb (91.6 kg)  05/26/21 199 lb 6.4 oz (90.4 kg)    Diabetic Foot Exam - Simple   No data filed    Lab Results  Component Value Date   WBC 9.1 04/08/2021   HGB 13.2 04/08/2021   HCT 39.0 04/08/2021   PLT 242.0 04/08/2021   GLUCOSE 119 (H) 04/08/2021   CHOL 99 04/08/2021   TRIG 91.0 04/08/2021   HDL 46.30 04/08/2021   LDLDIRECT 74.0 03/30/2018   LDLCALC 34 04/08/2021   ALT 24 04/08/2021   AST 31 04/08/2021   NA 134 (L) 04/08/2021   K 4.2 04/08/2021   CL 96 04/08/2021   CREATININE 0.91 04/08/2021   BUN 14 04/08/2021   CO2 27 04/08/2021   TSH 2.28 09/04/2019   PSA 0.33 04/08/2021   HGBA1C 6.9 (A) 10/13/2021   MICROALBUR <0.7 06/27/2016    Lab Results  Component Value Date   TSH 2.28 09/04/2019   Lab Results  Component Value Date   WBC 9.1 04/08/2021   HGB 13.2 04/08/2021   HCT 39.0 04/08/2021   MCV 83.2 04/08/2021   PLT 242.0 04/08/2021   Lab Results  Component Value Date   NA 134 (L) 04/08/2021   K 4.2 04/08/2021   CO2 27  04/08/2021   GLUCOSE 119 (H) 04/08/2021   BUN 14 04/08/2021   CREATININE 0.91 04/08/2021   BILITOT 0.7 04/08/2021   ALKPHOS 61 04/08/2021   AST 31 04/08/2021   ALT 24 04/08/2021   PROT 7.1 04/08/2021   ALBUMIN 4.4 04/08/2021   CALCIUM 9.5 04/08/2021   GFR 81.77 04/08/2021   Lab Results  Component Value Date   CHOL 99 04/08/2021   Lab Results  Component Value Date   HDL 46.30 04/08/2021   Lab Results  Component Value Date   LDLCALC 34 04/08/2021   Lab Results  Component Value Date   TRIG 91.0 04/08/2021   Lab Results  Component Value Date   CHOLHDL 2 04/08/2021   Lab Results   Component Value Date   HGBA1C 6.9 (A) 10/13/2021       Assessment & Plan:   Problem List Items Addressed This Visit   None  No orders of the defined types were placed in this encounter.   I, Bruce Romero, personally preformed the services described in this documentation.  All medical record entries made by the scribe were at my direction and in my presence.  I have reviewed the chart and discharge instructions (if applicable) and agree that the record reflects my personal performance and is accurate and complete. 01/17/2022   I,Amber Collins,acting as a scribe for Bruce Held, DO.,have documented all relevant documentation on the behalf of Bruce Held, DO,as directed by  Bruce Held, DO while in the presence of Bruce Held, DO.    [employer]

## 2022-01-17 NOTE — Patient Instructions (Signed)
Cholesterol Content in Foods ?Cholesterol is a waxy, fat-like substance that helps to carry fat in the blood. The body needs cholesterol in small amounts, but too much cholesterol can cause damage to the arteries and heart. ?What foods have cholesterol? ? ?Cholesterol is found in animal-based foods, such as meat, seafood, and dairy. Generally, low-fat dairy and lean meats have less cholesterol than full-fat dairy and fatty meats. The milligrams of cholesterol per serving (mg per serving) of common cholesterol-containing foods are listed below. ?Meats and other proteins ?Egg -- one large whole egg has 186 mg. ?Veal shank -- 4 oz (113 g) has 141 mg. ?Lean ground turkey (93% lean) -- 4 oz (113 g) has 118 mg. ?Fat-trimmed lamb loin -- 4 oz (113 g) has 106 mg. ?Lean ground beef (90% lean) -- 4 oz (113 g) has 100 mg. ?Lobster -- 3.5 oz (99 g) has 90 mg. ?Pork loin chops -- 4 oz (113 g) has 86 mg. ?Canned salmon -- 3.5 oz (99 g) has 83 mg. ?Fat-trimmed beef top loin -- 4 oz (113 g) has 78 mg. ?Frankfurter -- 1 frank (3.5 oz or 99 g) has 77 mg. ?Crab -- 3.5 oz (99 g) has 71 mg. ?Roasted chicken without skin, white meat -- 4 oz (113 g) has 66 mg. ?Light bologna -- 2 oz (57 g) has 45 mg. ?Deli-cut turkey -- 2 oz (57 g) has 31 mg. ?Canned tuna -- 3.5 oz (99 g) has 31 mg. ?Bacon -- 1 oz (28 g) has 29 mg. ?Oysters and mussels (raw) -- 3.5 oz (99 g) has 25 mg. ?Mackerel -- 1 oz (28 g) has 22 mg. ?Trout -- 1 oz (28 g) has 20 mg. ?Pork sausage -- 1 link (1 oz or 28 g) has 17 mg. ?Salmon -- 1 oz (28 g) has 16 mg. ?Tilapia -- 1 oz (28 g) has 14 mg. ?Dairy ?Soft-serve ice cream -- ? cup (4 oz or 86 g) has 103 mg. ?Whole-milk yogurt -- 1 cup (8 oz or 245 g) has 29 mg. ?Cheddar cheese -- 1 oz (28 g) has 28 mg. ?American cheese -- 1 oz (28 g) has 28 mg. ?Whole milk -- 1 cup (8 oz or 250 mL) has 23 mg. ?2% milk -- 1 cup (8 oz or 250 mL) has 18 mg. ?Cream cheese -- 1 tablespoon (Tbsp) (14.5 g) has 15 mg. ?Cottage cheese -- ? cup (4 oz or  113 g) has 14 mg. ?Low-fat (1%) milk -- 1 cup (8 oz or 250 mL) has 10 mg. ?Sour cream -- 1 Tbsp (12 g) has 8.5 mg. ?Low-fat yogurt -- 1 cup (8 oz or 245 g) has 8 mg. ?Nonfat Greek yogurt -- 1 cup (8 oz or 228 g) has 7 mg. ?Half-and-half cream -- 1 Tbsp (15 mL) has 5 mg. ?Fats and oils ?Cod liver oil -- 1 tablespoon (Tbsp) (13.6 g) has 82 mg. ?Butter -- 1 Tbsp (14 g) has 15 mg. ?Lard -- 1 Tbsp (12.8 g) has 14 mg. ?Bacon grease -- 1 Tbsp (12.9 g) has 14 mg. ?Mayonnaise -- 1 Tbsp (13.8 g) has 5-10 mg. ?Margarine -- 1 Tbsp (14 g) has 3-10 mg. ?The items listed above may not be a complete list of foods with cholesterol. Exact amounts of cholesterol in these foods may vary depending on specific ingredients and brands. Contact a dietitian for more information. ?What foods do not have cholesterol? ?Most plant-based foods do not have cholesterol unless you combine them with a food that has   cholesterol. Foods without cholesterol include: ?Grains and cereals. ?Vegetables. ?Fruits. ?Vegetable oils, such as olive, canola, and sunflower oil. ?Legumes, such as peas, beans, and lentils. ?Nuts and seeds. ?Egg whites. ?The items listed above may not be a complete list of foods that do not have cholesterol. Contact a dietitian for more information. ?Summary ?The body needs cholesterol in small amounts, but too much cholesterol can cause damage to the arteries and heart. ?Cholesterol is found in animal-based foods, such as meat, seafood, and dairy. Generally, low-fat dairy and lean meats have less cholesterol than full-fat dairy and fatty meats. ?This information is not intended to replace advice given to you by your health care provider. Make sure you discuss any questions you have with your health care provider. ?Document Revised: 06/05/2020 Document Reviewed: 06/05/2020 ?Elsevier Patient Education ? 2023 Elsevier Inc. ? ?

## 2022-01-18 ENCOUNTER — Telehealth: Payer: Self-pay | Admitting: Family Medicine

## 2022-01-18 NOTE — Telephone Encounter (Signed)
Patient called to get clarification on his celecoxib (CELEBREX) 200 MG capsule.   Patient was taking 100 mg twice a day on his old prescription. He assumed he would be taking the 200 mg capsule once per day but the new prescription says for him to take 200 mg capsule twice a day which would have him taking 400 mg per day.   Please advise how he should take this new prescription.

## 2022-01-18 NOTE — Telephone Encounter (Signed)
Pt sent Mychart message as well. Will respond to that

## 2022-01-26 MED ORDER — ICOSAPENT ETHYL 1 G PO CAPS: 2.0000 g | ORAL_CAPSULE | Freq: Two times a day (BID) | ORAL | 1 refills | Status: DC

## 2022-02-16 DIAGNOSIS — L814 Other melanin hyperpigmentation: Secondary | ICD-10-CM | POA: Diagnosis not present

## 2022-02-16 DIAGNOSIS — L304 Erythema intertrigo: Secondary | ICD-10-CM | POA: Diagnosis not present

## 2022-02-16 DIAGNOSIS — L578 Other skin changes due to chronic exposure to nonionizing radiation: Secondary | ICD-10-CM | POA: Diagnosis not present

## 2022-02-16 DIAGNOSIS — L568 Other specified acute skin changes due to ultraviolet radiation: Secondary | ICD-10-CM | POA: Diagnosis not present

## 2022-02-16 DIAGNOSIS — L821 Other seborrheic keratosis: Secondary | ICD-10-CM | POA: Diagnosis not present

## 2022-02-16 DIAGNOSIS — L570 Actinic keratosis: Secondary | ICD-10-CM | POA: Diagnosis not present

## 2022-02-16 DIAGNOSIS — D225 Melanocytic nevi of trunk: Secondary | ICD-10-CM | POA: Diagnosis not present

## 2022-02-22 ENCOUNTER — Other Ambulatory Visit: Payer: Self-pay | Admitting: Family Medicine

## 2022-02-22 DIAGNOSIS — I10 Essential (primary) hypertension: Secondary | ICD-10-CM

## 2022-02-23 ENCOUNTER — Other Ambulatory Visit: Payer: Self-pay | Admitting: Family Medicine

## 2022-02-23 DIAGNOSIS — R350 Frequency of micturition: Secondary | ICD-10-CM

## 2022-02-24 ENCOUNTER — Encounter: Payer: Self-pay | Admitting: Family Medicine

## 2022-02-24 DIAGNOSIS — I10 Essential (primary) hypertension: Secondary | ICD-10-CM

## 2022-03-02 MED ORDER — CANDESARTAN CILEXETIL 16 MG PO TABS: ORAL_TABLET | ORAL | 1 refills | Status: DC

## 2022-03-02 NOTE — Addendum Note (Signed)
Addended by: Sanda Linger on: 03/02/2022 09:54 AM   Modules accepted: Orders

## 2022-03-08 DIAGNOSIS — Z872 Personal history of diseases of the skin and subcutaneous tissue: Secondary | ICD-10-CM | POA: Diagnosis not present

## 2022-03-08 DIAGNOSIS — Z09 Encounter for follow-up examination after completed treatment for conditions other than malignant neoplasm: Secondary | ICD-10-CM | POA: Diagnosis not present

## 2022-03-08 DIAGNOSIS — L905 Scar conditions and fibrosis of skin: Secondary | ICD-10-CM | POA: Diagnosis not present

## 2022-03-08 DIAGNOSIS — L578 Other skin changes due to chronic exposure to nonionizing radiation: Secondary | ICD-10-CM | POA: Diagnosis not present

## 2022-03-19 ENCOUNTER — Encounter: Payer: Self-pay | Admitting: Family Medicine

## 2022-03-19 ENCOUNTER — Other Ambulatory Visit: Payer: Self-pay | Admitting: Family Medicine

## 2022-03-19 DIAGNOSIS — K219 Gastro-esophageal reflux disease without esophagitis: Secondary | ICD-10-CM

## 2022-03-21 MED ORDER — ESOMEPRAZOLE MAGNESIUM 40 MG PO CPDR: 40.0000 mg | DELAYED_RELEASE_CAPSULE | Freq: Every day | ORAL | 1 refills | Status: DC

## 2022-03-21 MED ORDER — AMLODIPINE BESYLATE 10 MG PO TABS: ORAL_TABLET | ORAL | 1 refills | Status: DC

## 2022-03-21 MED ORDER — ATORVASTATIN CALCIUM 10 MG PO TABS: ORAL_TABLET | ORAL | 1 refills | Status: DC

## 2022-04-12 LAB — HM DIABETES EYE EXAM

## 2022-04-12 NOTE — Progress Notes (Unsigned)
Name: Bruce Romero  Age/ Sex: 77 y.o., male   MRN/ DOB: [id number], May 09, 1945     PCP: Carollee Herter, Alferd Apa, DO   Reason for Endocrinology Evaluation: Type 2 Diabetes Mellitus  Initial Endocrine Consultative Visit: 04/26/2016    PATIENT IDENTIFIER: Bruce Romero is a 77 y.o. male with a past medical history of T2DM, HTN, fibromyalgia. The patient has followed with Endocrinology clinic since 04/26/2016 for consultative assistance with management of his diabetes.  DIABETIC HISTORY:  Bruce Romero was diagnosed with DM 2006, intolerant to Invokana-balanitis, intolerant to Ozempic-GI side effects. His hemoglobin A1c has ranged from 6.2% in 2018, peaking at 7.8% in 2023.   He was followed by Dr. Loanne Drilling between 2018 and April 2023   Will switch Trulicity to [facility] [id number]  SUBJECTIVE:   During the last visit (10/13/2021): A1c 6.9%  Today (04/12/2022): Bruce Romero is here for follow-up on diabetes management He checks his blood sugars 2-3 times week. The patient has not had hypoglycemic episodes since the last clinic visit   He didn't qualify for pt assistance  Takes repaglinide with Breakfast and Supper  He is paying $300 for a month supply of Trulicity  Denies nausea, vomiting or diarrhea    HOME DIABETES REGIMEN:  Metformin 750 XR twice daily Repaglinide 1 mg twice daily Mounjaro 5 mg weekly     Statin: Yes ACE-I/ARB :yes     METER DOWNLOAD SUMMARY: Did not bring     DIABETIC COMPLICATIONS: Microvascular complications:   Denies: CKD, retinopathy, neuropathy Last Eye Exam: Completed 03/2021  Macrovascular complications:   Denies: CAD, CVA, PVD   HISTORY:  Past Medical History:  Past Medical History:  Diagnosis Date   Acute medial meniscal tear, left, initial encounter    Anxiety    Diabetes mellitus    GERD (gastroesophageal reflux disease)    Hyperlipidemia    Hypertension    Irritable bowel    Spastic colon    anaphylaxis due to [place]   Past Surgical History:  Past Surgical History:  Procedure Laterality Date   CHOLECYSTECTOMY     CHONDROPLASTY Left 01/15/2016   Procedure: CHONDROPLASTY;  Surgeon: Melrose Nakayama, MD;  Location: [place];  Service: Orthopedics;  Laterality: Left;   KNEE ARTHROSCOPY WITH MEDIAL MENISECTOMY Left 01/15/2016   Procedure: ARTHROSCOPY LEFT KNEE WITH PARTIAL MEDIAL MENISECTOMY;  Surgeon: Melrose Nakayama, MD;  Location: [facility];  Service: Orthopedics;  Laterality: Left;   NASAL SINUS SURGERY     Social History:  reports that he has quit smoking. He has never used smokeless tobacco. He reports current alcohol use. He reports that he does not use drugs. Family History:  Family History  Problem Relation Age of Onset   Stroke Mother    Pulmonary embolism Father    Diabetes Brother      HOME MEDICATIONS: Allergies as of 04/13/2022       Reactions   Cephalexin Anaphylaxis   REACTION: pt is not allergic to penicillin   Bactrim [sulfamethoxazole-trimethoprim] Other (See Comments)   ? Took Bactrim and Flexeril at same time had lip swelling unsure which caused reaction.   Bromocriptine Nausea And Vomiting   Flexeril [cyclobenzaprine] Swelling   Invokana [canagliflozin]    balanitis        Medication List        Accurate as of April 12, 2022  9:14 AM. If you have any questions, ask your nurse or doctor.  albuterol 108 (90 Base) MCG/ACT inhaler Commonly known as: VENTOLIN HFA Inhale 2 puffs into the lungs every 6 (six) hours as needed for wheezing or shortness of breath.   ALPRAZolam 0.25 MG tablet Commonly known as: XANAX TAKE 1 TABLET BY MOUTH THREE TIMES DAILY AS NEEDED   amLODipine 10 MG tablet Commonly known as: NORVASC TAKE 1 TABLET(10 MG) BY MOUTH DAILY   atorvastatin 10 MG tablet Commonly known as: LIPITOR Take 1 tablet by mouth daily.   azelastine 0.1 % nasal spray Commonly known as: ASTELIN Place 1 spray into both  nostrils 2 (two) times daily. Use in each nostril as directed   candesartan 16 MG tablet Commonly known as: ATACAND TAKE 1 TABLET(16 MG) BY MOUTH AT BEDTIME   celecoxib 200 MG capsule Commonly known as: CeleBREX Take 1 capsule (200 mg total) by mouth 2 (two) times daily.   co-enzyme Q-10 30 MG capsule Take 100 mg by mouth daily.   esomeprazole 40 MG capsule Commonly known as: NexIUM Take 1 capsule (40 mg total) by mouth daily.   fluticasone 50 MCG/ACT nasal spray Commonly known as: FLONASE Place into both nostrils daily.   icosapent Ethyl 1 g capsule Commonly known as: Vascepa Take 2 capsules (2 g total) by mouth 2 (two) times daily.   Insulin Pen Needle 32G X 6 MM Misc Use as directed with insulin pen   linaclotide 290 MCG Caps capsule Commonly known as: Linzess Take 1 capsule (290 mcg total) by mouth daily before breakfast.   linaclotide 290 MCG Caps capsule Commonly known as: Linzess Take 1 capsule (290 mcg total) by mouth daily before breakfast.   meloxicam 7.5 MG tablet Commonly known as: MOBIC Take 1 tablet (7.5 mg total) by mouth daily.   metFORMIN 750 MG 24 hr tablet Commonly known as: GLUCOPHAGE-XR Take 1 tablet (750 mg total) by mouth 2 (two) times daily.   niacin 500 MG tablet Commonly known as: (VITAMIN B3) Take 500 mg by mouth at bedtime.   ofloxacin 0.3 % OTIC solution Commonly known as: Floxin Otic Place 10 drops into the right ear daily.   ondansetron 4 MG tablet Commonly known as: Zofran Take 1 tablet (4 mg total) by mouth every 8 (eight) hours as needed for nausea or vomiting.   OneTouch Verio test strip Generic drug: glucose blood TEST ONCE DAILY   repaglinide 1 MG tablet Commonly known as: PRANDIN Take 1 tablet (1 mg total) by mouth 2 (two) times daily before a meal.   rOPINIRole 1 MG tablet Commonly known as: Requip Take 1 tablet (1 mg total) by mouth at bedtime.   tamsulosin 0.4 MG Caps capsule Commonly known as: FLOMAX TAKE  1 CAPSULE(0.4 MG) BY MOUTH DAILY   tirzepatide 5 MG/0.5ML Pen Commonly known as: MOUNJARO Inject 5 mg into the skin once a week.   Vitamin D 50 MCG (2000 UT) tablet Take 2,000 Units by mouth daily.         OBJECTIVE:   Vital Signs: There were no vitals taken for this visit.  Wt Readings from Last 3 Encounters:  01/17/22 198 lb 3.2 oz (89.9 kg)  10/13/21 205 lb 6.4 oz (93.2 kg)  08/24/21 202 lb (91.6 kg)     Exam: General: Pt appears well and is in NAD  Neck: General: Supple without adenopathy. Thyroid: Thyroid size normal.  No goiter or nodules appreciated.   Lungs: Clear with good BS bilat   Heart: RRR   Abdomen:  soft, nontender  Extremities:  No pretibial edema.   Neuro: MS is good with appropriate affect, pt is alert and Ox3    DM foot exam: 10/13/2021  The skin of the feet is intact without sores or ulcerations. The pedal pulses are 2+ on right and 2+ on left. The sensation is intact to a screening 5.07, 10 gram monofilament bilaterally        DATA REVIEWED:  Lab Results  Component Value Date   HGBA1C 6.9 (A) 10/13/2021   HGBA1C 6.9 (A) 05/26/2021   HGBA1C 7.8 (A) 02/17/2021    Latest Reference Range & Units 01/17/22 11:44  Sodium 135 - 145 mEq/L 135  Potassium 3.5 - 5.1 mEq/L 3.9  Chloride 96 - 112 mEq/L 99  CO2 19 - 32 mEq/L 26  Glucose 70 - 99 mg/dL 141 (H)  BUN 6 - 23 mg/dL 11  Creatinine 0.40 - 1.50 mg/dL 0.96  Calcium 8.4 - 10.5 mg/dL 8.6  Alkaline Phosphatase 39 - 117 U/L 47  Albumin 3.5 - 5.2 g/dL 4.5  AST 0 - 37 U/L 33  ALT 0 - 53 U/L 25  Total Protein 6.0 - 8.3 g/dL 6.9  Total Bilirubin 0.2 - 1.2 mg/dL 0.6  GFR >60.00 mL/min 76.27    Latest Reference Range & Units 01/17/22 11:44  Total CHOL/HDL Ratio  2  Cholesterol 0 - 200 mg/dL 106  HDL Cholesterol >39.00 mg/dL 53.20  LDL (calc) 0 - 99 mg/dL 32  MICROALB/CREAT RATIO 0.0 - 30.0 mg/g 0.7  NonHDL  52.90  Triglycerides 0.0 - 149.0 mg/dL 104.0  VLDL 0.0 - 40.0 mg/dL 20.8     ASSESSMENT / PLAN / RECOMMENDATIONS:   1) Type 2 Diabetes Mellitus, Optimally controlled, Without complications - Most recent A1c of 6.9 %. Goal A1c < 7.0 %.     -Glycemic control remains at goal - Intolerant to Ozempic 2 mg weekly , tolerating current dose of Trulicity without side effects, he is currently in the donut hole and the cost for Trulicity pen is around $300.  Per patient he did not qualify for patient assistance program in the past.  He was given the option between discontinuing Trulicity and increasing repaglinide, patient opted to continue on Trulicity due to benefits, he was provided with a pharmacy discount card to see if that would work -No changes at this time    MEDICATIONS: Continue metformin 750 mg XR, twice daily Continue repaglinide 1 mg twice daily Continue Trulicity 1.5 mg weekly  EDUCATION / INSTRUCTIONS: BG monitoring instructions: Patient is instructed to check his blood sugars 1 times a day, fasting . Call [facility] if: BG persistently < 70  I reviewed the Rule of 15 for the treatment of hypoglycemia in detail with the patient. Literature supplied.    2) Diabetic complications:  Eye: Does not have known diabetic retinopathy.  Neuro/ Feet: Does not have known diabetic peripheral neuropathy .  Renal: Patient does not have known baseline CKD. He   is  on an ACEI/ARB at present.       F/U in 6 months   Signed electronically by: Mack Guise, MD  [facility]  [facility] [address] [place], [place] [address] Phone: [phone] FAX: [phone]   CC: [address] [place] [address] Phone: [phone]  Fax: [phone]  Return to Endocrinology clinic as below: Future Appointments  Date Time Provider [place]  04/13/2022 11:30 AM Jorgia Manthei, Melanie Crazier,  MD [facility] None

## 2022-04-13 ENCOUNTER — Ambulatory Visit: Payer: Medicare Other | Admitting: Internal Medicine

## 2022-04-13 ENCOUNTER — Encounter: Payer: Self-pay | Admitting: Internal Medicine

## 2022-04-13 VITALS — BP 120/80 | HR 73 | Ht 69.0 in | Wt 195.0 lb

## 2022-04-13 DIAGNOSIS — E119 Type 2 diabetes mellitus without complications: Secondary | ICD-10-CM | POA: Diagnosis not present

## 2022-04-13 LAB — POCT GLUCOSE (DEVICE FOR HOME USE): POC Glucose: 136 mg/dl — AB (ref 70–99)

## 2022-04-13 LAB — POCT GLYCOSYLATED HEMOGLOBIN (HGB A1C): Hemoglobin A1C: 6.3 % — AB (ref 4.0–5.6)

## 2022-04-13 MED ORDER — TIRZEPATIDE 5 MG/0.5ML ~~LOC~~ SOAJ: 5.0000 mg | SUBCUTANEOUS | 3 refills | Status: DC

## 2022-04-13 MED ORDER — METFORMIN HCL ER 750 MG PO TB24: 750.0000 mg | ORAL_TABLET | Freq: Two times a day (BID) | ORAL | 3 refills | Status: DC

## 2022-04-13 MED ORDER — REPAGLINIDE 0.5 MG PO TABS: 0.5000 mg | ORAL_TABLET | Freq: Two times a day (BID) | ORAL | 2 refills | Status: DC

## 2022-04-13 NOTE — Patient Instructions (Addendum)
Continue Metformin 750 XR twice daily Decrease  Repaglinide 0.5 mg twice daily Continue Mounjaro 5 mg weekly    HOW TO TREAT LOW BLOOD SUGARS (Blood sugar LESS THAN 70 MG/DL) Please follow the RULE OF 15 for the treatment of hypoglycemia treatment (when your (blood sugars are less than 70 mg/dL)   STEP 1: Take 15 grams of carbohydrates when your blood sugar is low, which includes:  3-4 GLUCOSE TABS  OR 3-4 OZ OF JUICE OR REGULAR SODA OR ONE TUBE OF GLUCOSE GEL    STEP 2: RECHECK blood sugar in 15 MINUTES STEP 3: If your blood sugar is still low at the 15 minute recheck --> then, go back to STEP 1 and treat AGAIN with another 15 grams of carbohydrates.

## 2022-04-14 ENCOUNTER — Other Ambulatory Visit (HOSPITAL_BASED_OUTPATIENT_CLINIC_OR_DEPARTMENT_OTHER): Payer: Self-pay

## 2022-04-15 ENCOUNTER — Other Ambulatory Visit: Payer: Self-pay

## 2022-04-15 DIAGNOSIS — E119 Type 2 diabetes mellitus without complications: Secondary | ICD-10-CM

## 2022-04-15 MED ORDER — ONETOUCH VERIO VI STRP: ORAL_STRIP | 3 refills | Status: DC

## 2022-04-19 ENCOUNTER — Other Ambulatory Visit: Payer: Self-pay

## 2022-04-19 DIAGNOSIS — E119 Type 2 diabetes mellitus without complications: Secondary | ICD-10-CM

## 2022-04-19 MED ORDER — ONETOUCH VERIO VI STRP: ORAL_STRIP | 3 refills | Status: DC

## 2022-05-03 DIAGNOSIS — K589 Irritable bowel syndrome without diarrhea: Secondary | ICD-10-CM | POA: Diagnosis not present

## 2022-05-03 DIAGNOSIS — R198 Other specified symptoms and signs involving the digestive system and abdomen: Secondary | ICD-10-CM | POA: Diagnosis not present

## 2022-05-03 DIAGNOSIS — E119 Type 2 diabetes mellitus without complications: Secondary | ICD-10-CM | POA: Diagnosis not present

## 2022-05-03 DIAGNOSIS — I10 Essential (primary) hypertension: Secondary | ICD-10-CM | POA: Diagnosis not present

## 2022-05-25 ENCOUNTER — Telehealth: Payer: Self-pay | Admitting: *Deleted

## 2022-05-25 NOTE — Telephone Encounter (Signed)
The following alternative is the preferred alternative: [name]. Would you like to switch to the provided preferred alternative? Please note: Bruce Romero is the preferred covered alternative and will process at a lower tier without the need of a Prior Authorization.*

## 2022-05-25 NOTE — Telephone Encounter (Signed)
Pharmacy notified that insurance will pay for brand.  Medication changed to brand.  They stated price will be $360.  They will contact patient.

## 2022-05-25 NOTE — Telephone Encounter (Signed)
Prior auth started via cover my meds.  Awaiting determination.  Key: [id number]

## 2022-05-30 ENCOUNTER — Other Ambulatory Visit: Payer: Self-pay | Admitting: Family Medicine

## 2022-05-30 DIAGNOSIS — J302 Other seasonal allergic rhinitis: Secondary | ICD-10-CM

## 2022-06-08 ENCOUNTER — Other Ambulatory Visit (HOSPITAL_COMMUNITY): Payer: Self-pay

## 2022-06-08 ENCOUNTER — Telehealth: Payer: Self-pay

## 2022-06-08 NOTE — Telephone Encounter (Signed)
Patient Advocate Encounter   Received notification from [facility] that prior authorization is required for [facility]  Submitted: 06/08/22 [facility]

## 2022-06-08 NOTE — Telephone Encounter (Signed)
Pharmacy Patient Advocate Encounter  Prior Authorization has been approved  Effective dates: 06/08/22 through 02/07/2023

## 2022-06-14 ENCOUNTER — Other Ambulatory Visit (HOSPITAL_COMMUNITY): Payer: Self-pay

## 2022-06-21 ENCOUNTER — Encounter: Payer: Self-pay | Admitting: Family Medicine

## 2022-06-21 ENCOUNTER — Ambulatory Visit (INDEPENDENT_AMBULATORY_CARE_PROVIDER_SITE_OTHER): Payer: Medicare Other | Admitting: Family Medicine

## 2022-06-21 VITALS — BP 131/68 | HR 80 | Temp 97.6°F | Ht 70.0 in | Wt 187.5 lb

## 2022-06-21 DIAGNOSIS — R059 Cough, unspecified: Secondary | ICD-10-CM | POA: Diagnosis not present

## 2022-06-21 DIAGNOSIS — J4520 Mild intermittent asthma, uncomplicated: Secondary | ICD-10-CM

## 2022-06-21 DIAGNOSIS — J302 Other seasonal allergic rhinitis: Secondary | ICD-10-CM

## 2022-06-21 DIAGNOSIS — E119 Type 2 diabetes mellitus without complications: Secondary | ICD-10-CM

## 2022-06-21 MED ORDER — MONTELUKAST SODIUM 10 MG PO TABS: 10.0000 mg | ORAL_TABLET | Freq: Every day | ORAL | 3 refills | Status: DC

## 2022-06-21 MED ORDER — ALBUTEROL SULFATE HFA 108 (90 BASE) MCG/ACT IN AERS: 2.0000 | INHALATION_SPRAY | Freq: Four times a day (QID) | RESPIRATORY_TRACT | 0 refills | Status: DC | PRN

## 2022-06-21 MED ORDER — HYDROCOD POLI-CHLORPHE POLI ER 10-8 MG/5ML PO SUER: 5.0000 mL | Freq: Every evening | ORAL | 0 refills | Status: DC | PRN

## 2022-06-21 NOTE — Progress Notes (Signed)
Chief Complaint  Patient presents with   Nasal Congestion   Allergies   Cough    Bruce Romero here for URI complaints.  Duration: a few months when he goes outside; has been outside quite a bit Associated symptoms: rhinorrhea, intermittent sob, itchy watery eyes, and sneezing, dry cough Denies: sinus congestion, sinus pain, itchy watery eyes, ear pain, ear drainage, sore throat, wheezing, myalgia, and fevers Treatment to date: Allegra, Flonase, Astelin, SABA which did help Sick contacts: No  Past Medical History:  Diagnosis Date   Acute medial meniscal tear, left, initial encounter    Anxiety    Diabetes mellitus    GERD (gastroesophageal reflux disease)    Hyperlipidemia    Hypertension    Irritable bowel    Spastic colon    anaphylaxis due to Keflex 1988    Objective BP 131/68 (BP Location: Left Arm, Patient Position: Sitting, Cuff Size: Normal)   Pulse 80   Temp 97.6 F (36.4 C) (Oral)   Ht 5\' 10"  (1.778 m)   Wt 187 lb 8 oz (85 kg)   SpO2 96%   BMI 26.90 kg/m  General: Awake, alert, appears stated age HEENT: AT, [place], ears patent b/l and TM's neg, nares patent w/o discharge, pharynx pink and without exudates, MMM Neck: No masses or asymmetry Heart: RRR MSK: TTP over mid sternum/reproducible Lungs: CTAB, no accessory muscle use Psych: Age appropriate judgment and insight, normal mood and affect  Seasonal allergies - Plan: montelukast (SINGULAIR) 10 MG tablet  Cough, unspecified type - Plan: chlorpheniramine-HYDROcodone (TUSSIONEX) 10-8 MG/5ML  Mild intermittent extrinsic asthma without complication - Plan: albuterol (VENTOLIN HFA) 108 (90 Base) MCG/ACT inhaler  Type 2 diabetes mellitus without complication, without long-term current use of insulin (HCC) - Plan: Hemoglobin A1c  Chronic, uncontrolled. Cont INCS and Allegra/Astein. Add Singulair 10 mg/d. Continue to push fluids, practice good hand hygiene, cover mouth when coughing. Refill cough syrup for  nighttime use which he has historically done well with.  SABA prn.  Requested I check his A1c today.  F/u prn. If starting to experience fevers, shaking, or shortness of breath, seek immediate care. Pt voiced understanding and agreement to the plan.  Jilda Roche [place], DO 06/21/22 3:04 PM

## 2022-06-21 NOTE — Patient Instructions (Addendum)
Stay on the Allegra and Flonase daily. I also want you to be diligent taking your Astelin daily.   Use the albuterol as needed.  The Singulair/montelukast is daily as needed.  Give [place] 2-3 business days to get the results of your labs back.   Let us know if you need anything.

## 2022-06-22 ENCOUNTER — Other Ambulatory Visit: Payer: Self-pay | Admitting: Family Medicine

## 2022-06-22 LAB — HEMOGLOBIN A1C: Hgb A1c MFr Bld: 6.5 % (ref 4.6–6.5)

## 2022-07-12 DIAGNOSIS — L570 Actinic keratosis: Secondary | ICD-10-CM | POA: Diagnosis not present

## 2022-07-12 DIAGNOSIS — L578 Other skin changes due to chronic exposure to nonionizing radiation: Secondary | ICD-10-CM | POA: Diagnosis not present

## 2022-07-12 DIAGNOSIS — L03032 Cellulitis of left toe: Secondary | ICD-10-CM | POA: Diagnosis not present

## 2022-08-12 ENCOUNTER — Encounter: Payer: Self-pay | Admitting: Family Medicine

## 2022-08-12 ENCOUNTER — Ambulatory Visit (INDEPENDENT_AMBULATORY_CARE_PROVIDER_SITE_OTHER): Payer: Medicare Other | Admitting: Family Medicine

## 2022-08-12 VITALS — BP 112/68 | HR 67 | Temp 98.1°F | Ht 69.5 in | Wt 184.5 lb

## 2022-08-12 DIAGNOSIS — S79921A Unspecified injury of right thigh, initial encounter: Secondary | ICD-10-CM

## 2022-08-12 DIAGNOSIS — N419 Inflammatory disease of prostate, unspecified: Secondary | ICD-10-CM | POA: Diagnosis not present

## 2022-08-12 MED ORDER — LEVOFLOXACIN 500 MG PO TABS: 500.0000 mg | ORAL_TABLET | Freq: Every day | ORAL | 0 refills | Status: AC

## 2022-08-12 NOTE — Progress Notes (Signed)
Chief Complaint  Patient presents with   Prostatitis   leg cramp    Bruce Romero is a 77 y.o. male here for possible prostatitis.  Duration: 4 days. Symptoms: Dysuria, urinary frequency and low back pain, pressure in lower abd region Denies: hematuria, urinary hesitancy, urinary retention, fever, nausea, and vomiting, discharge Hx of prostatitis? Yes Doxycycline for 2 d at home did help a little.  Denies new sexual partners. Allergic to Bactrim; does not do well w FQ's.   Tight inner thigh muscle leading to cramping of the hamstring for the past several months. No inj or change in activity. Used a heating pad and stretching without relief. Unchanged.   Past Medical History:  Diagnosis Date   Acute medial meniscal tear, left, initial encounter    Anxiety    Diabetes mellitus    GERD (gastroesophageal reflux disease)    Hyperlipidemia    Hypertension    Irritable bowel    Spastic colon    anaphylaxis due to Keflex 1988     BP 112/68 (BP Location: Left Arm, Patient Position: Sitting, Cuff Size: Normal)   Pulse 67   Temp 98.1 F (36.7 C) (Oral)   Ht 5' 9.5" (1.765 m)   Wt 184 lb 8 oz (83.7 kg)   SpO2 99%   BMI 26.86 kg/m  General: Awake, alert, appears stated age Heart: RRR Lungs: CTAB, normal respiratory effort, no accessory muscle usage Abd: BS+, soft, NT, ND, no masses or organomegaly MSK: Poor hamstring flexibility.  No TTP over the hamstring or inner thigh musculature.  No asymmetry, edema, ecchymosis, or erythema. MSK: No CVA tenderness, neg Lloyd's sign Psych: Age appropriate judgment and insight  Prostatitis, unspecified prostatitis type - Plan: levofloxacin (LEVAQUIN) 500 MG tablet  Thigh injury, right, initial encounter  Recurrence of chronic issue.  He did well with Cipro from an efficacy standpoint and would like to try Levaquin.  He is allergic to Bactrim.  He gets adverse reactions from Cipro.  Stay hydrated. Stretches and exercises provided.   Offered physical therapy which he politely declined for now.  Heat, ice, Tylenol. The patient voiced understanding and agreement to the plan.  Jilda Roche [place], DO 08/12/22 11:43 AM

## 2022-08-12 NOTE — Patient Instructions (Signed)
Heat (pad or rice pillow in microwave) over affected area, 10-15 minutes twice daily.   Ice/cold pack over area for 10-15 min twice daily.  Stay hydrated.   Warning signs/symptoms: Uncontrollable nausea/vomiting, fevers, worsening symptoms despite treatment, confusion.  Give [place] around 2 business days to get culture back to you.  Let us know if you need anything.  Semimembranosus Tendinitis Rehab  It is normal to feel mild stretching, pulling, tightness, or discomfort as you do these exercises, but you should stop right away if you feel sudden pain or your pain gets worse.  Stretching and range of motion exercises These exercises warm up your muscles and joints and improve the movement and flexibility of your thigh. These exercises also help to relieve pain, numbness, and tingling. Exercise A: Hamstring stretch, supine    Lie on your back. Loop a belt or towel across the ball of your left / right foot The ball of your foot is on the walking surface, right under your toes. Straighten your left / right knee and slowly pull on the belt to raise your leg. Stop when you feel a gentle stretch behind your left / right knee or thigh. Do not allow the knee to bend. Keep your other leg flat on the floor. Hold this position for 30 seconds. Repeat 2 times. Complete this exercise 3 times a week. Strengthening exercises These exercises build strength and endurance in your thigh. Endurance is the ability to use your muscles for a long time, even after they get tired. Exercise B: Straight leg raises (hip extensors) Lie on your belly on a bed or a firm surface with a pillow under your hips. Bend your left / right knee so your foot is straight up in the air. Squeeze your buttock muscles and lift your left / right thigh off the bed. Do not let your back arch. Hold this position for 3 seconds. Slowly return to the starting position. Let your muscles relax completely before you do another  repetition. Repeat 2 times. Complete this exercise 3 times a week. Exercise C: Bridge (hip extensors)     Lie on your back on a firm surface with your knees bent and your feet flat on the floor. Tighten your buttocks muscles and lift your bottom off the floor until your trunk is level with your thighs. You should feel the muscles working in your buttocks and the back of your thighs. If you do not feel these muscles, slide your feet 1-2 inches (2.5-5 cm) farther away from your buttocks. Do not arch your back. Hold this position for 3 seconds. Slowly lower your hips to the starting position. Let your buttocks muscles relax completely between repetitions. If this exercise is too easy, try doing it with your arms crossed over your chest. Repeat 2 times. Complete this exercise 3 times a week. Exercise D: Hamstring eccentric, prone Lie on your belly on a bed or on the floor. Start with your legs straight. Cross your legs at the ankles with your left / right leg on top. Using your bottom leg to do the work, bend both knees. Using just your left / right leg alone, slowly lower your leg back down toward the bed. Add a 5 lb weight as told by your health care provider. Let your muscles relax completely between repetitions. Repeat 2 times. Complete this exercise 3 times a week. Exercise E: Squats Stand in front of a table, with your feet and knees pointing straight ahead. You may rest  your hands on the table for balance but not for support. Slowly bend your knees and lower your hips like you are going to sit in a chair. Keep your thighs straight or pointed slightly outward. Keep your weight over your heels, not over your toes. Keep your lower legs upright so they are parallel with the table legs. Do not let your hips go lower than your knees. Stop when your knees are bent to the shape of an upside-down letter "L" (90 degree angle). Do not bend lower than told by your health care provider. If your  knee pain increases, do not bend as low. Hold the squat position 1-2 seconds. Slowly push with your legs to return to standing. Do not use your hands to pull yourself to standing. Repeat 2 times. Complete this exercise 3 times a week. Make sure you discuss any questions you have with your health care provider. Document Released: 01/24/2005 Document Revised: 10/01/2015 Document Reviewed: 10/28/2014 Elsevier Interactive Patient Education  [employer].

## 2022-08-16 ENCOUNTER — Other Ambulatory Visit: Payer: Self-pay | Admitting: Family Medicine

## 2022-08-16 DIAGNOSIS — K219 Gastro-esophageal reflux disease without esophagitis: Secondary | ICD-10-CM

## 2022-08-29 ENCOUNTER — Other Ambulatory Visit: Payer: Self-pay | Admitting: Family Medicine

## 2022-08-29 ENCOUNTER — Encounter: Payer: Self-pay | Admitting: Family Medicine

## 2022-08-29 MED ORDER — LEVOFLOXACIN 750 MG PO TABS: 750.0000 mg | ORAL_TABLET | Freq: Every day | ORAL | 0 refills | Status: AC

## 2022-08-30 ENCOUNTER — Other Ambulatory Visit: Payer: Self-pay | Admitting: Family Medicine

## 2022-09-03 ENCOUNTER — Other Ambulatory Visit: Payer: Self-pay | Admitting: Family Medicine

## 2022-09-03 DIAGNOSIS — J302 Other seasonal allergic rhinitis: Secondary | ICD-10-CM

## 2022-09-14 ENCOUNTER — Encounter: Payer: Self-pay | Admitting: Family Medicine

## 2022-09-15 ENCOUNTER — Other Ambulatory Visit: Payer: Self-pay | Admitting: Family Medicine

## 2022-09-15 DIAGNOSIS — I10 Essential (primary) hypertension: Secondary | ICD-10-CM

## 2022-09-15 MED ORDER — CANDESARTAN CILEXETIL 16 MG PO TABS
ORAL_TABLET | ORAL | 1 refills | Status: DC
Start: 2022-09-15 — End: 2023-03-02

## 2022-09-26 ENCOUNTER — Encounter: Payer: Self-pay | Admitting: Family Medicine

## 2022-09-26 DIAGNOSIS — F411 Generalized anxiety disorder: Secondary | ICD-10-CM

## 2022-09-29 ENCOUNTER — Encounter: Payer: Self-pay | Admitting: Family Medicine

## 2022-09-29 ENCOUNTER — Ambulatory Visit (INDEPENDENT_AMBULATORY_CARE_PROVIDER_SITE_OTHER): Payer: Medicare Other | Admitting: Family Medicine

## 2022-09-29 VITALS — BP 128/80 | HR 69 | Temp 97.8°F | Resp 18 | Ht 69.5 in | Wt 188.0 lb

## 2022-09-29 DIAGNOSIS — J4520 Mild intermittent asthma, uncomplicated: Secondary | ICD-10-CM

## 2022-09-29 DIAGNOSIS — E559 Vitamin D deficiency, unspecified: Secondary | ICD-10-CM | POA: Diagnosis not present

## 2022-09-29 DIAGNOSIS — K219 Gastro-esophageal reflux disease without esophagitis: Secondary | ICD-10-CM

## 2022-09-29 DIAGNOSIS — E785 Hyperlipidemia, unspecified: Secondary | ICD-10-CM | POA: Diagnosis not present

## 2022-09-29 DIAGNOSIS — F411 Generalized anxiety disorder: Secondary | ICD-10-CM | POA: Diagnosis not present

## 2022-09-29 DIAGNOSIS — E1169 Type 2 diabetes mellitus with other specified complication: Secondary | ICD-10-CM | POA: Diagnosis not present

## 2022-09-29 DIAGNOSIS — Z79899 Other long term (current) drug therapy: Secondary | ICD-10-CM

## 2022-09-29 DIAGNOSIS — I10 Essential (primary) hypertension: Secondary | ICD-10-CM | POA: Diagnosis not present

## 2022-09-29 DIAGNOSIS — Z7985 Long-term (current) use of injectable non-insulin antidiabetic drugs: Secondary | ICD-10-CM | POA: Diagnosis not present

## 2022-09-29 DIAGNOSIS — J45909 Unspecified asthma, uncomplicated: Secondary | ICD-10-CM | POA: Insufficient documentation

## 2022-09-29 DIAGNOSIS — E119 Type 2 diabetes mellitus without complications: Secondary | ICD-10-CM | POA: Diagnosis not present

## 2022-09-29 MED ORDER — ALPRAZOLAM 0.25 MG PO TABS: ORAL_TABLET | ORAL | 1 refills | Status: DC

## 2022-09-29 NOTE — Progress Notes (Signed)
Established Patient Office Visit  Subjective   Patient ID: Bruce Romero, male    DOB: Oct 17, 1945  Age: 77 y.o. MRN: [id number]  Chief Complaint  Patient presents with   Hypertension   Hyperlipidemia   Follow-up    HPI Discussed the use of AI scribe software for clinical note transcription with the patient, who gave verbal consent to proceed.  History of Present Illness   The patient, with a history of hypertension, hyperlipidemia, and diabetes, presents for a prescription refill of Toya Smothers, which he takes twice weekly for insomnia. He reports that it is effective. He is satisfied with his other medications and has no new complaints. His last A1c in May was 6.5. He also has a history of prostatitis, which has resolved. He has a history of skin cancer and has had multiple surgeries, the most recent within the last year on his lip. He reports that the surgical site feels rough and is a different color, but he is not concerned about it. He sees a dermatologist every six months.      Patient Active Problem List   Diagnosis Date Noted   Squamous cell carcinoma in situ (SCCIS) of skin of cheek 11/18/2016    Priority: High    Class: Diagnosis of   Preventative health care 06/27/2016    Priority: High   Generalized anxiety disorder 09/29/2022   Extrinsic asthma 09/29/2022   Seasonal allergies 06/21/2022   Viral upper respiratory tract infection 02/15/2021   Anxiety 03/30/2018   Essential hypertension 03/30/2018   Hyperlipidemia associated with type 2 diabetes mellitus (HCC) 03/30/2018   Uncontrolled type 2 diabetes mellitus with hyperglycemia (HCC) 03/30/2018   Diabetes (HCC) 04/26/2017   Corneal abrasion of both eyes 02/26/2016   Insomnia 05/09/2014   Allergic rhinitis 05/09/2014   RLS (restless legs syndrome) 01/21/2014   Bronchospasm 12/12/2012   Sinusitis, acute 11/29/2012   Need for prophylactic vaccination and inoculation against influenza 11/29/2012   Abscess 11/21/2011    OA (osteoarthritis of spine) 02/11/2011   Palpitations 12/30/2010   Abdominal pain, other specified site 04/28/2010   POSTMENOPAUSAL STATUS 02/10/2010   SHOULDER PAIN, RIGHT 11/03/2009   FREQUENCY, URINARY 11/03/2009   CERUMEN IMPACTION, BILATERAL 05/26/2009   UNSPECIFIED VITAMIN D DEFICIENCY 05/13/2008   Hyperlipidemia LDL goal <100 05/13/2008   ESSENTIAL HYPERTENSION, BENIGN 05/13/2008   URI 01/22/2007   HEMATURIA 10/24/2006   PROSTATITIS, ACUTE 10/24/2006   NASAL POLYP 07/04/2006   REFLUX, ESOPHAGEAL 07/04/2006   FIBROMYALGIA 07/04/2006   Past Medical History:  Diagnosis Date   Acute medial meniscal tear, left, initial encounter    Anxiety    Diabetes mellitus    GERD (gastroesophageal reflux disease)    Hyperlipidemia    Hypertension    Irritable bowel    Spastic colon    anaphylaxis due to Keflex 1988   Past Surgical History:  Procedure Laterality Date   CHOLECYSTECTOMY     CHONDROPLASTY Left 01/15/2016   Procedure: CHONDROPLASTY;  Surgeon: Marcene Corning, MD;  Location: [facility];  Service: Orthopedics;  Laterality: Left;   KNEE ARTHROSCOPY WITH MEDIAL MENISECTOMY Left 01/15/2016   Procedure: ARTHROSCOPY LEFT KNEE WITH PARTIAL MEDIAL MENISECTOMY;  Surgeon: Marcene Corning, MD;  Location: [facility];  Service: Orthopedics;  Laterality: Left;   NASAL SINUS SURGERY     Social History   Tobacco Use   Smoking status: Former   Smokeless tobacco: Never  Substance Use Topics   Alcohol use: Yes    Alcohol/week:  0.0 standard drinks of alcohol    Comment: social   Drug use: No   Social History   Socioeconomic History   Marital status: Married    Spouse name: Not on file   Number of children: Not on file   Years of education: Not on file   Highest education level: Not on file  Occupational History   Occupation: retired  Tobacco Use   Smoking status: Former   Smokeless tobacco: Never  Substance and Sexual Activity   Alcohol use:  Yes    Alcohol/week: 0.0 standard drinks of alcohol    Comment: social   Drug use: No   Sexual activity: Yes  Other Topics Concern   Not on file  Social History Narrative   Exercise try to everyday.   Social Determinants of Health   Financial Resource Strain: Not on file  Food Insecurity: Not on file  Transportation Needs: Not on file  Physical Activity: Not on file  Stress: Not on file  Social Connections: Not on file  Intimate Partner Violence: Not on file   Family Status  Relation Name Status   Mother  Deceased   Father  Deceased   Brother  Deceased at age 2   Brother  Alive   Sister  Alive  No partnership data on file   Family History  Problem Relation Age of Onset   Stroke Mother    Pulmonary embolism Father    Diabetes Brother    Allergies  Allergen Reactions   Cephalexin Anaphylaxis    REACTION: pt is not allergic to penicillin   Bactrim [Sulfamethoxazole-Trimethoprim] Other (See Comments)    ? Took Bactrim and Flexeril at same time had lip swelling unsure which caused reaction.   Bromocriptine Nausea And Vomiting   Flexeril [Cyclobenzaprine] Swelling   Invokana [Canagliflozin]     balanitis      ROS    Objective:     BP 128/80 (BP Location: Left Arm, Patient Position: Sitting, Cuff Size: Normal)   Pulse 69   Temp 97.8 F (36.6 C) (Oral)   Resp 18   Ht 5' 9.5" (1.765 m)   Wt 188 lb (85.3 kg)   SpO2 98%   BMI 27.36 kg/m  BP Readings from Last 3 Encounters:  09/29/22 128/80  08/12/22 112/68  06/21/22 131/68   Wt Readings from Last 3 Encounters:  09/29/22 188 lb (85.3 kg)  08/12/22 184 lb 8 oz (83.7 kg)  06/21/22 187 lb 8 oz (85 kg)   SpO2 Readings from Last 3 Encounters:  09/29/22 98%  08/12/22 99%  06/21/22 96%      Physical Exam Vitals and nursing note reviewed.  Constitutional:      General: He is not in acute distress.    Appearance: Normal appearance. He is well-developed.  HENT:     Head: Normocephalic and atraumatic.   Eyes:     General: No scleral icterus.       Right eye: No discharge.        Left eye: No discharge.  Cardiovascular:     Rate and Rhythm: Normal rate and regular rhythm.     Heart sounds: No murmur heard. Pulmonary:     Effort: Pulmonary effort is normal. No respiratory distress.     Breath sounds: Normal breath sounds.  Musculoskeletal:        General: Normal range of motion.     Cervical back: Normal range of motion and neck supple.     Right lower  leg: No edema.     Left lower leg: No edema.  Skin:    General: Skin is warm and dry.  Neurological:     Mental Status: He is alert and oriented to person, place, and time.  Psychiatric:        Mood and Affect: Mood normal.        Behavior: Behavior normal.        Thought Content: Thought content normal.        Judgment: Judgment normal.      No results found for any visits on 09/29/22.  Last CBC Lab Results  Component Value Date   WBC 8.2 01/17/2022   HGB 13.5 01/17/2022   HCT 39.4 01/17/2022   MCV 83.7 01/17/2022   MCH 31.2 12/31/2010   RDW 15.3 01/17/2022   PLT 224.0 01/17/2022   Last metabolic panel Lab Results  Component Value Date   GLUCOSE 141 (H) 01/17/2022   NA 135 01/17/2022   K 3.9 01/17/2022   CL 99 01/17/2022   CO2 26 01/17/2022   BUN 11 01/17/2022   CREATININE 0.96 01/17/2022   GFR 76.27 01/17/2022   CALCIUM 8.6 01/17/2022   PROT 6.9 01/17/2022   ALBUMIN 4.5 01/17/2022   BILITOT 0.6 01/17/2022   ALKPHOS 47 01/17/2022   AST 33 01/17/2022   ALT 25 01/17/2022   Last lipids Lab Results  Component Value Date   CHOL 106 01/17/2022   HDL 53.20 01/17/2022   LDLCALC 32 01/17/2022   LDLDIRECT 74.0 03/30/2018   TRIG 104.0 01/17/2022   CHOLHDL 2 01/17/2022   Last hemoglobin A1c Lab Results  Component Value Date   HGBA1C 6.5 06/21/2022   Last thyroid functions Lab Results  Component Value Date   TSH 2.28 09/04/2019   T4TOTAL 9.7 12/30/2010   Last vitamin D Lab Results  Component Value  Date   VD25OH 59.48 04/08/2021   Last vitamin B12 and Folate No results found for: "VITAMINB12", "FOLATE"    The ASCVD Risk score (Arnett DK, et al., 2019) failed to calculate for the following reasons:   The valid total cholesterol range is 130 to 320 mg/dL    Assessment & Plan:   Problem List Items Addressed This Visit       Unprioritized   REFLUX, ESOPHAGEAL   Essential hypertension   Relevant Orders   Lipid panel   CBC with Differential/Platelet   Comprehensive metabolic panel   Hemoglobin A1c   Microalbumin / creatinine urine ratio   Diabetes (HCC)   Relevant Orders   CBC with Differential/Platelet   Comprehensive metabolic panel   Hemoglobin A1c   UNSPECIFIED VITAMIN D DEFICIENCY   Relevant Orders   VITAMIN D 25 Hydroxy (Vit-D Deficiency, Fractures)   Hyperlipidemia associated with type 2 diabetes mellitus (HCC)   Relevant Orders   Lipid panel   Comprehensive metabolic panel   Generalized anxiety disorder - Primary   Relevant Medications   ALPRAZolam (XANAX) 0.25 MG tablet   Extrinsic asthma   Other Visit Diagnoses     High risk medication use       Relevant Orders   Drug Monitoring Panel [phone] , Urine     Assessment and Plan    Insomnia Occasional use of Frazilan, effective for sleep initiation. -Refill Frazilan prescription.  Diabetes Last A1c in May was 6.5, patient reports good control with Mounjaro. -Order A1c to ensure continued control.  Skin Health History of multiple skin surgeries for sun damage, regular dermatology follow-ups. -Continue regular  dermatology appointments.  General Health Maintenance -Continue current medications for blood pressure and cholesterol. -Follow-up with Dr. Daylene Posey for diabetes management in September.        No follow-ups on file.    Donato Schultz, DO

## 2022-09-30 LAB — LIPID PANEL
Cholesterol: 107 mg/dL (ref 0–200)
HDL: 53.1 mg/dL (ref >39.00)
LDL Cholesterol: 32 mg/dL (ref 0–99)
NonHDL: 53.45
Total CHOL/HDL Ratio: 2
Triglycerides: 108 mg/dL (ref 0.0–149.0)
VLDL: 21.6 mg/dL (ref 0.0–40.0)

## 2022-09-30 LAB — CBC WITH DIFFERENTIAL/PLATELET
Basophils Absolute: 0.1 10*3/uL (ref 0.0–0.1)
Basophils Relative: 0.9 % (ref 0.0–3.0)
Eosinophils Absolute: 0.4 10*3/uL (ref 0.0–0.7)
Eosinophils Relative: 4.3 % (ref 0.0–5.0)
HCT: 39.3 % (ref 39.0–52.0)
Hemoglobin: 12.8 g/dL — ABNORMAL LOW (ref 13.0–17.0)
Lymphocytes Relative: 26.5 % (ref 12.0–46.0)
Lymphs Abs: 2.2 10*3/uL (ref 0.7–4.0)
MCHC: 32.6 g/dL (ref 30.0–36.0)
MCV: 87.1 fl (ref 78.0–100.0)
Monocytes Absolute: 0.6 10*3/uL (ref 0.1–1.0)
Monocytes Relative: 6.6 % (ref 3.0–12.0)
Neutro Abs: 5.2 10*3/uL (ref 1.4–7.7)
Neutrophils Relative %: 61.7 % (ref 43.0–77.0)
Platelets: 212 10*3/uL (ref 150.0–400.0)
RBC: 4.51 Mil/uL (ref 4.22–5.81)
RDW: 14.4 % (ref 11.5–15.5)
WBC: 8.5 10*3/uL (ref 4.0–10.5)

## 2022-09-30 LAB — HEMOGLOBIN A1C: Hgb A1c MFr Bld: 6.2 % (ref 4.6–6.5)

## 2022-09-30 LAB — COMPREHENSIVE METABOLIC PANEL
ALT: 18 U/L (ref 0–53)
AST: 26 U/L (ref 0–37)
Albumin: 4.3 g/dL (ref 3.5–5.2)
Alkaline Phosphatase: 45 U/L (ref 39–117)
BUN: 14 mg/dL (ref 6–23)
CO2: 28 meq/L (ref 19–32)
Calcium: 9.1 mg/dL (ref 8.4–10.5)
Chloride: 96 mEq/L (ref 96–112)
Creatinine, Ser: 0.9 mg/dL (ref 0.40–1.50)
GFR: 82 mL/min (ref >60.00)
Glucose, Bld: 94 mg/dL (ref 70–99)
Potassium: 4.3 meq/L (ref 3.5–5.1)
Sodium: 133 meq/L — ABNORMAL LOW (ref 135–145)
Total Bilirubin: 0.9 mg/dL (ref 0.2–1.2)
Total Protein: 6.8 g/dL (ref 6.0–8.3)

## 2022-09-30 LAB — VITAMIN D 25 HYDROXY (VIT D DEFICIENCY, FRACTURES): VITD: 65.43 ng/mL (ref 30.00–100.00)

## 2022-09-30 LAB — MICROALBUMIN / CREATININE URINE RATIO
Creatinine,U: 92.2 mg/dL
Microalb Creat Ratio: 0.8 mg/g (ref 0.0–30.0)
Microalb, Ur: <0.7 mg/dL (ref 0.0–1.9)

## 2022-10-01 LAB — DRUG MONITORING PANEL 376104, URINE
Amphetamines: NEGATIVE ng/mL (ref <500)
Barbiturates: NEGATIVE ng/mL (ref <300)
Benzodiazepines: NEGATIVE ng/mL (ref <100)
Cocaine Metabolite: NEGATIVE ng/mL (ref <150)
Desmethyltramadol: NEGATIVE ng/mL (ref <100)
Opiates: NEGATIVE ng/mL (ref <100)
Oxycodone: NEGATIVE ng/mL (ref <100)
Tramadol: NEGATIVE ng/mL (ref <100)

## 2022-10-01 LAB — DM TEMPLATE

## 2022-10-05 ENCOUNTER — Other Ambulatory Visit: Payer: Self-pay

## 2022-10-05 DIAGNOSIS — J302 Other seasonal allergic rhinitis: Secondary | ICD-10-CM

## 2022-10-05 MED ORDER — MONTELUKAST SODIUM 10 MG PO TABS
10.0000 mg | ORAL_TABLET | Freq: Every day | ORAL | 0 refills | Status: DC
Start: 2022-10-05 — End: 2023-03-08

## 2022-10-19 ENCOUNTER — Encounter: Payer: Self-pay | Admitting: Internal Medicine

## 2022-10-19 ENCOUNTER — Ambulatory Visit: Payer: Medicare Other | Admitting: Internal Medicine

## 2022-10-19 VITALS — BP 126/70 | HR 62 | Ht 69.5 in | Wt 190.6 lb

## 2022-10-19 DIAGNOSIS — E119 Type 2 diabetes mellitus without complications: Secondary | ICD-10-CM | POA: Diagnosis not present

## 2022-10-19 DIAGNOSIS — Z7984 Long term (current) use of oral hypoglycemic drugs: Secondary | ICD-10-CM | POA: Diagnosis not present

## 2022-10-19 DIAGNOSIS — Z7985 Long-term (current) use of injectable non-insulin antidiabetic drugs: Secondary | ICD-10-CM | POA: Diagnosis not present

## 2022-10-19 LAB — POCT GLUCOSE (DEVICE FOR HOME USE): POC Glucose: 96 mg/dL (ref 70–99)

## 2022-10-19 MED ORDER — METFORMIN HCL ER 750 MG PO TB24
750.0000 mg | ORAL_TABLET | Freq: Two times a day (BID) | ORAL | 3 refills | Status: DC
Start: 2022-10-19 — End: 2023-04-19

## 2022-10-19 MED ORDER — TIRZEPATIDE 7.5 MG/0.5ML ~~LOC~~ SOAJ: 7.5000 mg | SUBCUTANEOUS | 3 refills | Status: DC

## 2022-10-19 NOTE — Progress Notes (Signed)
Name: Bruce Romero  Age/ Sex: 77 y.o., male   MRN/ DOB: [id number], [birth year]     PCP: Donato Schultz, DO   Reason for Endocrinology Evaluation: Type 2 Diabetes Mellitus  Initial Endocrine Consultative Visit: 04/26/2016    Romero IDENTIFIER: Bruce Romero is a 77 y.o. male with a past medical history of T2DM, HTN, fibromyalgia. Bruce Romero has followed with Endocrinology clinic since 04/26/2016 for consultative assistance with management of his diabetes.  DIABETIC HISTORY:  Bruce Romero was diagnosed with DM 2006, intolerant to Invokana-balanitis, intolerant to Ozempic-GI side effects. His hemoglobin A1c has ranged from 6.2% in 2018, peaking at 7.8% in 2023.   He was followed by Dr. Everardo All between 2018 and April 2023   Switch Trulicity to [facility] 10/2021 , no intolerance to Trulicity   SUBJECTIVE:   During Bruce last visit (04/13/2022): A1c 6.3%  Today (10/19/2022): Bruce Romero is here for follow-up on diabetes management He checks his blood sugars 2-3 times week. Bruce Romero has not had hypoglycemic episodes since Bruce last clinic visit  Weight continues to fluctuate Denies  nausea, vomiting  Denies constipation or diarrhea  He does have chronic right foot abnormal sensation that he attributes to back issues   HOME DIABETES REGIMEN:  Metformin 750 XR twice daily Repaglinide 0.5 mg twice daily Mounjaro 5 mg weekly     Statin: Yes ACE-I/ARB :yes     METER DOWNLOAD SUMMARY: Did not bring     DIABETIC COMPLICATIONS: Microvascular complications:   Denies: CKD, retinopathy, neuropathy Last Eye Exam: Completed 03/2021  Macrovascular complications:   Denies: CAD, CVA, PVD   HISTORY:  Past Medical History:  Past Medical History:  Diagnosis Date   Acute medial meniscal tear, left, initial encounter    Anxiety    Diabetes mellitus    GERD (gastroesophageal reflux disease)    Hyperlipidemia    Hypertension    Irritable bowel    Spastic  colon    anaphylaxis due to Keflex 1988   Past Surgical History:  Past Surgical History:  Procedure Laterality Date   CHOLECYSTECTOMY     CHONDROPLASTY Left 01/15/2016   Procedure: CHONDROPLASTY;  Surgeon: Marcene Corning, MD;  Location: [facility];  Service: Orthopedics;  Laterality: Left;   KNEE ARTHROSCOPY WITH MEDIAL MENISECTOMY Left 01/15/2016   Procedure: ARTHROSCOPY LEFT KNEE WITH PARTIAL MEDIAL MENISECTOMY;  Surgeon: Marcene Corning, MD;  Location: [facility];  Service: Orthopedics;  Laterality: Left;   NASAL SINUS SURGERY     Social History:  reports that he has quit smoking. He has never used smokeless tobacco. He reports current alcohol use. He reports that he does not use drugs. Family History:  Family History  Problem Relation Age of Onset   Stroke Mother    Pulmonary embolism Father    Diabetes Brother      HOME MEDICATIONS: Allergies as of 10/19/2022       Reactions   Cephalexin Anaphylaxis   REACTION: Bruce Romero is not allergic to penicillin   Bactrim [sulfamethoxazole-trimethoprim] Other (See Comments)   ? Took Bactrim and Flexeril at same time had lip swelling unsure which caused reaction.   Bromocriptine Nausea And Vomiting   Flexeril [cyclobenzaprine] Swelling   Invokana [canagliflozin]    balanitis        Medication List        Accurate as of October 19, 2022 11:23 AM. If you have any questions, ask your nurse or doctor.  albuterol 108 (90 Base) MCG/ACT inhaler Commonly known as: VENTOLIN HFA Inhale 2 puffs into Bruce lungs every 6 (six) hours as needed for wheezing or shortness of breath.   ALPRAZolam 0.25 MG tablet Commonly known as: XANAX TAKE 1 TABLET BY MOUTH THREE TIMES DAILY AS NEEDED   amLODipine 10 MG tablet Commonly known as: NORVASC TAKE 1 TABLET(10 MG) BY MOUTH DAILY   atorvastatin 10 MG tablet Commonly known as: LIPITOR Take 1 tablet by mouth daily.   azelastine 0.1 % nasal spray Commonly  known as: ASTELIN PLACE 1 SPRAY IN EACH NOSTRIL TWICE DAILY   candesartan 16 MG tablet Commonly known as: ATACAND TAKE 1 TABLET(16 MG) BY MOUTH AT BEDTIME   celecoxib 200 MG capsule Commonly known as: CeleBREX Take 1 capsule (200 mg total) by mouth 2 (two) times daily.   chlorpheniramine-HYDROcodone 10-8 MG/5ML Commonly known as: TUSSIONEX Take 5 mLs by mouth at bedtime as needed for cough.   co-enzyme Q-10 30 MG capsule Take 100 mg by mouth daily.   erythromycin ophthalmic ointment Place 1 Application into both eyes as needed.   esomeprazole 40 MG capsule Commonly known as: NEXIUM Take 1 capsule (40 mg total) by mouth daily. Overdue for appt   fluticasone 50 MCG/ACT nasal spray Commonly known as: FLONASE Place into both nostrils daily.   Insulin Pen Needle 32G X 6 MM Misc Use as directed with insulin pen   linaclotide 290 MCG Caps capsule Commonly known as: Linzess Take 1 capsule (290 mcg total) by mouth daily before breakfast.   linaclotide 290 MCG Caps capsule Commonly known as: Linzess Take 1 capsule (290 mcg total) by mouth daily before breakfast.   meloxicam 7.5 MG tablet Commonly known as: MOBIC Take 1 tablet (7.5 mg total) by mouth daily.   metFORMIN 750 MG 24 hr tablet Commonly known as: GLUCOPHAGE-XR Take 1 tablet (750 mg total) by mouth 2 (two) times daily.   montelukast 10 MG tablet Commonly known as: SINGULAIR Take 1 tablet (10 mg total) by mouth at bedtime.   niacin 500 MG tablet Commonly known as: (VITAMIN B3) Take 500 mg by mouth at bedtime.   ofloxacin 0.3 % OTIC solution Commonly known as: Floxin Otic Place 10 drops into Bruce right ear daily.   ondansetron 4 MG tablet Commonly known as: Zofran Take 1 tablet (4 mg total) by mouth every 8 (eight) hours as needed for nausea or vomiting.   OneTouch Verio test strip Generic drug: glucose blood TEST ONCE DAILY   repaglinide 0.5 MG tablet Commonly known as: PRANDIN Take 1 tablet (0.5 mg  total) by mouth 2 (two) times daily before a meal.   rOPINIRole 1 MG tablet Commonly known as: Requip Take 1 tablet (1 mg total) by mouth at bedtime.   tamsulosin 0.4 MG Caps capsule Commonly known as: FLOMAX TAKE 1 CAPSULE(0.4 MG) BY MOUTH DAILY   tirzepatide 5 MG/0.5ML Pen Commonly known as: MOUNJARO Inject 5 mg into Bruce skin once a week.   Vitamin D 50 MCG (2000 UT) tablet Take 2,000 Units by mouth daily.         OBJECTIVE:   Vital Signs: BP 126/70 (BP Location: Left Arm, Romero Position: Sitting, Cuff Size: Small)   Pulse 62   Ht 5' 9.5" (1.765 m)   Wt 190 lb 9.6 oz (86.5 kg)   SpO2 99%   BMI 27.74 kg/m   Wt Readings from Last 3 Encounters:  10/19/22 190 lb 9.6 oz (86.5 kg)  09/29/22 188 lb (  85.3 kg)  08/12/22 184 lb 8 oz (83.7 kg)     Exam: General: Bruce Romero appears well and is in NAD  Neck: General: Supple without adenopathy. Thyroid: Thyroid size normal.  No goiter or nodules appreciated.   Lungs: Clear with good BS bilat   Heart: RRR   Extremities: Trace  pretibial edema.   Neuro: MS is good with appropriate affect, Bruce Romero is alert and Ox3    DM foot exam: 10/19/2022  Bruce skin of Bruce feet is intact without sores or ulcerations. Bruce pedal pulses are 2+ on right and 2+ on left. Bruce sensation is intact to a screening 5.07, 10 gram monofilament bilaterally        DATA REVIEWED:  Lab Results  Component Value Date   HGBA1C 6.2 09/29/2022   HGBA1C 6.5 06/21/2022   HGBA1C 6.3 (A) 04/13/2022    Latest Reference Range & Units 09/29/22 14:07  Sodium 135 - 145 mEq/L 133 (L)  Potassium 3.5 - 5.1 mEq/L 4.3  Chloride 96 - 112 mEq/L 96  CO2 19 - 32 mEq/L 28  Glucose 70 - 99 mg/dL 94  BUN 6 - 23 mg/dL 14  Creatinine 6.04 - 5.40 mg/dL 9.81  Calcium 8.4 - 19.1 mg/dL 9.1  Alkaline Phosphatase 39 - 117 U/L 45  Albumin 3.5 - 5.2 g/dL 4.3  AST 0 - 37 U/L 26  ALT 0 - 53 U/L 18  Total Protein 6.0 - 8.3 g/dL 6.8  Total Bilirubin 0.2 - 1.2 mg/dL 0.9  GFR >47.82  mL/min 82.00  Total CHOL/HDL Ratio  2  Cholesterol 0 - 200 mg/dL 956  HDL Cholesterol >21.30 mg/dL 86.57  LDL (calc) 0 - 99 mg/dL 32  MICROALB/CREAT RATIO 0.0 - 30.0 mg/g 0.8  NonHDL  53.45  Triglycerides 0.0 - 149.0 mg/dL 846.9  VLDL 0.0 - 62.9 mg/dL 52.8    Latest Reference Range & Units 09/29/22 14:07  Creatinine,U mg/dL 41.3  Microalb, Ur 0.0 - 1.9 mg/dL <2.4  MICROALB/CREAT RATIO 0.0 - 30.0 mg/g 0.8    Old records , labs and images have been reviewed.    ASSESSMENT / PLAN / RECOMMENDATIONS:   1) Type 2 Diabetes Mellitus, Optimally controlled, Without complications - Most recent A1c of 6.2 %. Goal A1c < 7.0 %.     -A1c remains at goal - Intolerant to Ozempic 2 mg weekly  -I have recommended increasing Mounjaro and discontinuing repaglinide   MEDICATIONS: Stop repaglinide Continue metformin 750 mg XR, twice daily Increase Mounjaro 7.5 mg weekly  EDUCATION / INSTRUCTIONS: BG monitoring instructions: Romero is instructed to check his blood sugars 1 times a day, fasting . Call [facility] if: BG persistently < 70  I reviewed Bruce Rule of 15 for Bruce treatment of hypoglycemia in detail with Bruce Romero. Literature supplied.    2) Diabetic complications:  Eye: Does not have known diabetic retinopathy.  Neuro/ Feet: Does not have known diabetic peripheral neuropathy .  Renal: Romero does not have known baseline CKD. He   is  on an ACEI/ARB at present.    F/U in 6 months   I spent 30 minutes preparing to see Bruce Romero by review of recent labs, imaging and procedures, obtaining and reviewing separately obtained history, communicating with Bruce Romero, ordering medications, tests or procedures, and documenting clinical information in Bruce EHR including Bruce differential Dx, treatment, and any further evaluation and other management    Signed electronically by: Lyndle Herrlich, MD  [facility]  [facility] [address], [address] [place], [place] [address] Phone: [phone] FAX: [phone]   CC: Donato Schultz, DO [address] [place] [address] Phone: [phone]  Fax: [phone]  Return to Endocrinology clinic as below: Future Appointments  Date Time Provider Department Center  10/19/2022 11:30 AM Vivion Romano, Konrad Dolores, MD [id number] None    He questions

## 2022-10-19 NOTE — Patient Instructions (Addendum)
Stop  Repaglinide  Continue Metformin 750 XR twice daily Increase Mounjaro 7.5 mg weekly    HOW TO TREAT LOW BLOOD SUGARS (Blood sugar LESS THAN 70 MG/DL) Please follow the RULE OF 15 for the treatment of hypoglycemia treatment (when your (blood sugars are less than 70 mg/dL)   STEP 1: Take 15 grams of carbohydrates when your blood sugar is low, which includes:  3-4 GLUCOSE TABS  OR 3-4 OZ OF JUICE OR REGULAR SODA OR ONE TUBE OF GLUCOSE GEL    STEP 2: RECHECK blood sugar in 15 MINUTES STEP 3: If your blood sugar is still low at the 15 minute recheck --> then, go back to STEP 1 and treat AGAIN with another 15 grams of carbohydrates.

## 2022-11-20 ENCOUNTER — Other Ambulatory Visit: Payer: Self-pay | Admitting: Family Medicine

## 2022-11-20 DIAGNOSIS — K219 Gastro-esophageal reflux disease without esophagitis: Secondary | ICD-10-CM

## 2022-12-19 ENCOUNTER — Encounter: Payer: Self-pay | Admitting: Family Medicine

## 2022-12-19 ENCOUNTER — Other Ambulatory Visit: Payer: Self-pay | Admitting: Family Medicine

## 2023-01-02 ENCOUNTER — Encounter: Payer: Self-pay | Admitting: Internal Medicine

## 2023-01-02 MED ORDER — TIRZEPATIDE 5 MG/0.5ML ~~LOC~~ SOAJ: 5.0000 mg | SUBCUTANEOUS | 3 refills | Status: DC

## 2023-01-31 ENCOUNTER — Other Ambulatory Visit: Payer: Self-pay

## 2023-01-31 DIAGNOSIS — I10 Essential (primary) hypertension: Secondary | ICD-10-CM

## 2023-01-31 MED ORDER — AMLODIPINE BESYLATE 10 MG PO TABS
ORAL_TABLET | ORAL | 0 refills | Status: DC
Start: 2023-01-31 — End: 2023-05-01

## 2023-02-28 ENCOUNTER — Other Ambulatory Visit (HOSPITAL_COMMUNITY): Payer: Self-pay

## 2023-02-28 ENCOUNTER — Telehealth: Payer: Self-pay

## 2023-02-28 NOTE — Telephone Encounter (Signed)
Pharmacy Patient Advocate Encounter   Received notification from CoverMyMeds that prior authorization for [facility] is required/requested.   Insurance verification completed.   The patient is insured through [facility] .   Per test claim: PA required; PA submitted to above mentioned insurance via CoverMyMeds Key/confirmation #/EOC [facility] Status is pending

## 2023-03-02 ENCOUNTER — Other Ambulatory Visit: Payer: Self-pay | Admitting: Family Medicine

## 2023-03-02 DIAGNOSIS — I10 Essential (primary) hypertension: Secondary | ICD-10-CM

## 2023-03-04 ENCOUNTER — Other Ambulatory Visit: Payer: Self-pay | Admitting: Family Medicine

## 2023-03-08 ENCOUNTER — Other Ambulatory Visit: Payer: Self-pay

## 2023-03-08 ENCOUNTER — Encounter: Payer: Self-pay | Admitting: Family Medicine

## 2023-03-08 ENCOUNTER — Ambulatory Visit (INDEPENDENT_AMBULATORY_CARE_PROVIDER_SITE_OTHER): Payer: Medicare Other | Admitting: Family Medicine

## 2023-03-08 VITALS — BP 130/78 | HR 100 | Temp 97.8°F | Resp 16 | Ht 69.0 in | Wt <= 1120 oz

## 2023-03-08 DIAGNOSIS — J4521 Mild intermittent asthma with (acute) exacerbation: Secondary | ICD-10-CM

## 2023-03-08 DIAGNOSIS — J302 Other seasonal allergic rhinitis: Secondary | ICD-10-CM

## 2023-03-08 DIAGNOSIS — J209 Acute bronchitis, unspecified: Secondary | ICD-10-CM

## 2023-03-08 MED ORDER — PREDNISONE 20 MG PO TABS: 40.0000 mg | ORAL_TABLET | Freq: Every day | ORAL | 0 refills | Status: AC

## 2023-03-08 MED ORDER — MONTELUKAST SODIUM 10 MG PO TABS: 10.0000 mg | ORAL_TABLET | Freq: Every day | ORAL | 0 refills | Status: DC

## 2023-03-08 MED ORDER — ALBUTEROL SULFATE HFA 108 (90 BASE) MCG/ACT IN AERS: 2.0000 | INHALATION_SPRAY | Freq: Four times a day (QID) | RESPIRATORY_TRACT | 0 refills | Status: AC | PRN

## 2023-03-08 MED ORDER — HYDROCOD POLI-CHLORPHE POLI ER 10-8 MG/5ML PO SUER: 5.0000 mL | Freq: Every evening | ORAL | 0 refills | Status: AC | PRN

## 2023-03-08 NOTE — Patient Instructions (Addendum)
Continue to push fluids, practice good hand hygiene, and cover your mouth if you cough.  If you start having fevers, shaking or shortness of breath, seek immediate care.  OK to take Tylenol 1000 mg (2 extra strength tabs) or 975 mg (3 regular strength tabs) every 6 hours as needed.  Do not drink alcohol, do any illicit/street drugs, drive or do anything that requires alertness while on this medicine.   Let us know if you need anything.

## 2023-03-08 NOTE — Progress Notes (Signed)
Chief Complaint  Patient presents with   Cough    Cough and congestion    Bruce Romero here for URI complaints.  Duration: 1 week  Associated symptoms: sinus congestion, rhinorrhea, ear fullness, wheezing, slight shortness of breath and a dry cough Denies: sinus pain, itchy watery eyes, ear pain, ear drainage, sore throat, myalgia, and fevers Treatment to date: Astelin, Mucinex, INCS, SABA Sick contacts: No  Past Medical History:  Diagnosis Date   Acute medial meniscal tear, left, initial encounter    Anxiety    Diabetes mellitus    GERD (gastroesophageal reflux disease)    Hyperlipidemia    Hypertension    Irritable bowel    Spastic colon    anaphylaxis due to Keflex 1988    Objective BP 130/78   Pulse 100   Temp 97.8 F (36.6 C) (Oral)   Resp 16   Ht 5\' 9"  (1.753 m)   Wt 19 lb (8.618 kg)   SpO2 95%   BMI 2.81 kg/m  General: Awake, alert, appears stated age HEENT: AT, [place], ears patent b/l and TM's neg, nares patent w/o discharge, pharynx pink and without exudates, MMM, no sinus TTP Neck: No masses or asymmetry Heart: RRR Lungs: Faint wheezing heard at bases, no rales, no accessory muscle use Psych: Age appropriate judgment and insight, normal mood and affect  Mild intermittent asthma with acute exacerbation - Plan: predniSONE (DELTASONE) 20 MG tablet, albuterol (VENTOLIN HFA) 108 (90 Base) MCG/ACT inhaler  Acute wheezy bronchitis - Plan: chlorpheniramine-HYDROcodone (TUSSIONEX) 10-8 MG/5ML  Exacerbation of chronic issue.  5-day prednisone burst, refill albuterol.  Tussionex for nighttime use as needed.  He has tolerated this well before.  Warnings about medication verbalized and written down.  Continue to push fluids, practice good hand hygiene, cover mouth when coughing. F/u prn. If starting to experience fevers, shaking, or worsening shortness of breath, seek immediate care. Pt voiced understanding and agreement to the plan.  Jilda Roche [place],  DO 03/08/23 9:54 AM

## 2023-03-16 ENCOUNTER — Other Ambulatory Visit (HOSPITAL_COMMUNITY): Payer: Self-pay

## 2023-03-16 NOTE — Telephone Encounter (Signed)
 Pharmacy Patient Advocate Encounter  Received notification from [employer] that Prior Authorization for Mounjaro  5mg  has been CANCELLED due to this medication being on the plan's list of covered drugs.  Filled 03/13/23   PA #/Case ID/Reference #: [id number]

## 2023-03-29 ENCOUNTER — Encounter: Payer: Self-pay | Admitting: Internal Medicine

## 2023-04-19 ENCOUNTER — Ambulatory Visit: Payer: Medicare Other | Admitting: Internal Medicine

## 2023-04-19 ENCOUNTER — Encounter: Payer: Self-pay | Admitting: Internal Medicine

## 2023-04-19 VITALS — BP 120/76 | HR 72 | Wt 193.0 lb

## 2023-04-19 DIAGNOSIS — Z7984 Long term (current) use of oral hypoglycemic drugs: Secondary | ICD-10-CM | POA: Diagnosis not present

## 2023-04-19 DIAGNOSIS — Z7985 Long-term (current) use of injectable non-insulin antidiabetic drugs: Secondary | ICD-10-CM

## 2023-04-19 DIAGNOSIS — E119 Type 2 diabetes mellitus without complications: Secondary | ICD-10-CM

## 2023-04-19 LAB — POCT GLYCOSYLATED HEMOGLOBIN (HGB A1C): Hemoglobin A1C: 6.9 % — AB (ref 4.0–5.6)

## 2023-04-19 LAB — POCT GLUCOSE (DEVICE FOR HOME USE): POC Glucose: 152 mg/dL — AB (ref 70–99)

## 2023-04-19 MED ORDER — TIRZEPATIDE 5 MG/0.5ML ~~LOC~~ SOAJ: 5.0000 mg | SUBCUTANEOUS | 3 refills | Status: DC

## 2023-04-19 MED ORDER — METFORMIN HCL ER 750 MG PO TB24: 750.0000 mg | ORAL_TABLET | Freq: Two times a day (BID) | ORAL | 3 refills | Status: AC

## 2023-04-19 NOTE — Progress Notes (Unsigned)
 Name: Bruce Romero  Age/ Sex: 78 y.o., male   MRN/ DOB: [id number], [birth year]     PCP: Donato Schultz, DO   Reason for Endocrinology Evaluation: Type 2 Diabetes Mellitus  Initial Endocrine Consultative Visit: 04/26/2016    PATIENT IDENTIFIER: [name] is a 78 y.o. male with a past medical history of T2DM, HTN, fibromyalgia. The patient has followed with Endocrinology clinic since 04/26/2016 for consultative assistance with management of his diabetes.  DIABETIC HISTORY:  Mr. Broxterman was diagnosed with DM 2006, intolerant to Invokana-balanitis, intolerant to Ozempic-GI side effects. His hemoglobin A1c has ranged from 6.2% in 2018, peaking at 7.8% in 2023.   He was followed by Dr. Everardo All between 2018 and April 2023   Switch Trulicity to [facility] 10/2021 , no intolerance to Trulicity  He developed intolerance to Mounjaro 7.5 mg dose 12/2022  SUBJECTIVE:   During the last visit (10/19/2022): A1c 6.2%  Today (04/19/2023): [name] is here for follow-up on diabetes management He checks his blood sugars 2-3 times week. The patient has not had hypoglycemic episodes since the last clinic visit   Has rare  GI issues in the firm of discomfit the day of injection  He required prednisone for asthmatic bronchitis  Has occasional changes to bowel movement  due to IBS - on Linzess as needed He does have chronic right foot abnormal sensation that he attributes to back issues He is c/p difficulty falling asleep   HOME DIABETES REGIMEN:  Metformin 750 XR twice daily Mounjaro 5 mg weekly     Statin: Yes ACE-I/ARB :yes     METER DOWNLOAD SUMMARY: Did not bring     DIABETIC COMPLICATIONS: Microvascular complications:   Denies: CKD, retinopathy, neuropathy Last Eye Exam: Completed 03/2021  Macrovascular complications:   Denies: CAD, CVA, PVD   HISTORY:  Past Medical History:  Past Medical History:  Diagnosis Date   Acute medial meniscal tear, left,  initial encounter    Anxiety    Diabetes mellitus    GERD (gastroesophageal reflux disease)    Hyperlipidemia    Hypertension    Irritable bowel    Spastic colon    anaphylaxis due to Keflex 1988   Past Surgical History:  Past Surgical History:  Procedure Laterality Date   CHOLECYSTECTOMY     CHONDROPLASTY Left 01/15/2016   Procedure: CHONDROPLASTY;  Surgeon: Marcene Corning, MD;  Location: [facility];  Service: Orthopedics;  Laterality: Left;   KNEE ARTHROSCOPY WITH MEDIAL MENISECTOMY Left 01/15/2016   Procedure: ARTHROSCOPY LEFT KNEE WITH PARTIAL MEDIAL MENISECTOMY;  Surgeon: Marcene Corning, MD;  Location:  SURGERY CENTER;  Service: Orthopedics;  Laterality: Left;   NASAL SINUS SURGERY     Social History:  reports that he has quit smoking. He has never used smokeless tobacco. He reports current alcohol use. He reports that he does not use drugs. Family History:  Family History  Problem Relation Age of Onset   Stroke Mother    Pulmonary embolism Father    Diabetes Brother      HOME MEDICATIONS: Allergies as of 04/19/2023       Reactions   Cephalexin Anaphylaxis   REACTION: pt is not allergic to penicillin   Bactrim [sulfamethoxazole-trimethoprim] Other (See Comments)   ? Took Bactrim and Flexeril at same time had lip swelling unsure which caused reaction.   Bromocriptine Nausea And Vomiting   Flexeril [cyclobenzaprine] Swelling   Invokana [canagliflozin]    balanitis  Medication List        Accurate as of April 19, 2023 11:15 AM. If you have any questions, ask your nurse or doctor.          albuterol 108 (90 Base) MCG/ACT inhaler Commonly known as: VENTOLIN HFA Inhale 2 puffs into the lungs every 6 (six) hours as needed for wheezing or shortness of breath.   ALPRAZolam 0.25 MG tablet Commonly known as: XANAX TAKE 1 TABLET BY MOUTH THREE TIMES DAILY AS NEEDED   amLODipine 10 MG tablet Commonly known as: NORVASC TAKE 1  TABLET(10 MG) BY MOUTH DAILY   atorvastatin 10 MG tablet Commonly known as: LIPITOR TAKE 1 TABLET BY MOUTH DAILY   azelastine 0.1 % nasal spray Commonly known as: ASTELIN PLACE 1 SPRAY IN EACH NOSTRIL TWICE DAILY   candesartan 16 MG tablet Commonly known as: ATACAND TAKE 1 TABLET(16 MG) BY MOUTH AT BEDTIME   celecoxib 200 MG capsule Commonly known as: CeleBREX Take 1 capsule (200 mg total) by mouth 2 (two) times daily.   chlorpheniramine-HYDROcodone 10-8 MG/5ML Commonly known as: TUSSIONEX Take 5 mLs by mouth at bedtime as needed for cough.   co-enzyme Q-10 30 MG capsule Take 100 mg by mouth daily.   erythromycin ophthalmic ointment Place 1 Application into both eyes as needed.   esomeprazole 40 MG capsule Commonly known as: NEXIUM TAKE 1 CAPSULE(40 MG) BY MOUTH DAILY   Fluad 0.5 ML injection Generic drug: influenza vaccine adjuvanted   fluticasone 50 MCG/ACT nasal spray Commonly known as: FLONASE Place into both nostrils daily.   Insulin Pen Needle 32G X 6 MM Misc Use as directed with insulin pen   linaclotide 290 MCG Caps capsule Commonly known as: Linzess Take 1 capsule (290 mcg total) by mouth daily before breakfast.   linaclotide 290 MCG Caps capsule Commonly known as: Linzess Take 1 capsule (290 mcg total) by mouth daily before breakfast.   meloxicam 7.5 MG tablet Commonly known as: MOBIC Take 1 tablet (7.5 mg total) by mouth daily.   metFORMIN 750 MG 24 hr tablet Commonly known as: GLUCOPHAGE-XR Take 1 tablet (750 mg total) by mouth 2 (two) times daily.   montelukast 10 MG tablet Commonly known as: SINGULAIR Take 1 tablet (10 mg total) by mouth at bedtime.   niacin 500 MG tablet Commonly known as: (VITAMIN B3) Take 500 mg by mouth at bedtime.   ofloxacin 0.3 % OTIC solution Commonly known as: Floxin Otic Place 10 drops into the right ear daily.   OneTouch Verio test strip Generic drug: glucose blood TEST ONCE DAILY   rOPINIRole 1 MG  tablet Commonly known as: Requip Take 1 tablet (1 mg total) by mouth at bedtime.   tamsulosin 0.4 MG Caps capsule Commonly known as: FLOMAX TAKE 1 CAPSULE(0.4 MG) BY MOUTH DAILY   tirzepatide 5 MG/0.5ML Pen Commonly known as: MOUNJARO Inject 5 mg into the skin once a week.   Vitamin D 50 MCG (2000 UT) tablet Take 2,000 Units by mouth daily.         OBJECTIVE:   Vital Signs: BP 120/76 (BP Location: Left Arm, Patient Position: Sitting, Cuff Size: Small)   Pulse 72   Wt 193 lb (87.5 kg)   SpO2 99%   BMI 28.50 kg/m   Wt Readings from Last 3 Encounters:  04/19/23 193 lb (87.5 kg)  03/08/23 19 lb (8.618 kg)  10/19/22 190 lb 9.6 oz (86.5 kg)     Exam: General: Pt appears well and is in  NAD  Lungs: Clear with good BS bilat   Heart: RRR   Extremities: Trace  pretibial edema.   Neuro: MS is good with appropriate affect, pt is alert and Ox3    DM foot exam: 10/19/2022  The skin of the feet is intact without sores or ulcerations. The pedal pulses are 2+ on right and 2+ on left. The sensation is intact to a screening 5.07, 10 gram monofilament bilaterally        DATA REVIEWED:  Lab Results  Component Value Date   HGBA1C 6.2 09/29/2022   HGBA1C 6.5 06/21/2022   HGBA1C 6.3 (A) 04/13/2022    Latest Reference Range & Units 09/29/22 14:07  Sodium 135 - 145 mEq/L 133 (L)  Potassium 3.5 - 5.1 mEq/L 4.3  Chloride 96 - 112 mEq/L 96  CO2 19 - 32 mEq/L 28  Glucose 70 - 99 mg/dL 94  BUN 6 - 23 mg/dL 14  Creatinine 4.74 - 2.59 mg/dL 5.63  Calcium 8.4 - 87.5 mg/dL 9.1  Alkaline Phosphatase 39 - 117 U/L 45  Albumin 3.5 - 5.2 g/dL 4.3  AST 0 - 37 U/L 26  ALT 0 - 53 U/L 18  Total Protein 6.0 - 8.3 g/dL 6.8  Total Bilirubin 0.2 - 1.2 mg/dL 0.9  GFR >64.33 mL/min 82.00  Total CHOL/HDL Ratio  2  Cholesterol 0 - 200 mg/dL 295  HDL Cholesterol >18.84 mg/dL 16.60  LDL (calc) 0 - 99 mg/dL 32  MICROALB/CREAT RATIO 0.0 - 30.0 mg/g 0.8  NonHDL  53.45  Triglycerides 0.0 -  149.0 mg/dL 630.1  VLDL 0.0 - 60.1 mg/dL 09.3    Latest Reference Range & Units 09/29/22 14:07  Creatinine,U mg/dL 23.5  Microalb, Ur 0.0 - 1.9 mg/dL <5.7  MICROALB/CREAT RATIO 0.0 - 30.0 mg/g 0.8    Old records , labs and images have been reviewed.   In office Bg 152 mg/dL   ASSESSMENT / PLAN / RECOMMENDATIONS:   1) Type 2 Diabetes Mellitus, Optimally controlled, Without complications - Most recent A1c of 6.9 %. Goal A1c < 7.0 %.     -A1c remains at goal - Intolerant to Ozempic 2 mg weekly  -Intolerant to Mounjaro 7.5 mg dose -Patient would like to focus on lifestyle changes with low-carb diet and increasing physical activity -No changes at this time  MEDICATIONS:  Continue metformin 750 mg XR, twice daily Continue Mounjaro 5 mg weekly  EDUCATION / INSTRUCTIONS: BG monitoring instructions: Patient is instructed to check his blood sugars 1 times a day, fasting . Call [facility] if: BG persistently < 70  I reviewed the Rule of 15 for the treatment of hypoglycemia in detail with the patient. Literature supplied.    2) Diabetic complications:  Eye: Does not have known diabetic retinopathy.  Neuro/ Feet: Does not have known diabetic peripheral neuropathy .  Renal: Patient does not have known baseline CKD. He   is  on an ACEI/ARB at present.    F/U in 6 months     Signed electronically by: Lyndle Herrlich, MD  [facility] End[facility]  [facility] [address] [place] [place], [place] [address] Phone: [phone] FAX: [phone]   CC: [place] Organ [address] [facility] [address] [place] [address] Phone: [phone]  Fax: [phone]  Return to Endocrinology clinic as below: Future Appointments  Date Time Provider Department Center  04/19/2023 11:30 AM Rubel Heckard, Konrad Dolores, MD [id number] None    He questions

## 2023-04-19 NOTE — Patient Instructions (Addendum)
  Continue Metformin 750 XR twice daily Continue  Mounjaro 5 mg weekly    HOW TO TREAT LOW BLOOD SUGARS (Blood sugar LESS THAN 70 MG/DL) Please follow the RULE OF 15 for the treatment of hypoglycemia treatment (when your (blood sugars are less than 70 mg/dL)   STEP 1: Take 15 grams of carbohydrates when your blood sugar is low, which includes:  3-4 GLUCOSE TABS  OR 3-4 OZ OF JUICE OR REGULAR SODA OR ONE TUBE OF GLUCOSE GEL    STEP 2: RECHECK blood sugar in 15 MINUTES STEP 3: If your blood sugar is still low at the 15 minute recheck --> then, go back to STEP 1 and treat AGAIN with another 15 grams of carbohydrates.

## 2023-05-01 ENCOUNTER — Other Ambulatory Visit: Payer: Self-pay | Admitting: Family Medicine

## 2023-05-01 DIAGNOSIS — I10 Essential (primary) hypertension: Secondary | ICD-10-CM

## 2023-05-10 LAB — HM DIABETES EYE EXAM

## 2023-05-17 ENCOUNTER — Encounter: Payer: Self-pay | Admitting: Family Medicine

## 2023-05-17 ENCOUNTER — Ambulatory Visit (INDEPENDENT_AMBULATORY_CARE_PROVIDER_SITE_OTHER): Admitting: Family Medicine

## 2023-05-17 VITALS — BP 128/72 | HR 81 | Temp 97.6°F | Ht 69.0 in | Wt 194.8 lb

## 2023-05-17 DIAGNOSIS — J0100 Acute maxillary sinusitis, unspecified: Secondary | ICD-10-CM

## 2023-05-17 DIAGNOSIS — J4521 Mild intermittent asthma with (acute) exacerbation: Secondary | ICD-10-CM

## 2023-05-17 MED ORDER — DOXYCYCLINE HYCLATE 100 MG PO TABS: 100.0000 mg | ORAL_TABLET | Freq: Two times a day (BID) | ORAL | 0 refills | Status: AC

## 2023-05-17 MED ORDER — PREDNISONE 20 MG PO TABS
40.0000 mg | ORAL_TABLET | Freq: Every day | ORAL | 0 refills | Status: AC
Start: 2023-05-17 — End: 2023-05-22

## 2023-05-17 NOTE — Patient Instructions (Addendum)
 Continue to push fluids, practice good hand hygiene, and cover your mouth if you cough.  If you start having fevers, shaking or shortness of breath, seek immediate care.  OK to take Tylenol 1000 mg (2 extra strength tabs) or 975 mg (3 regular strength tabs) every 6 hours as needed.  If not significantly better by Friday, take the doxycycline (antibiotic).   Let us know if you need anything.

## 2023-05-17 NOTE — Progress Notes (Signed)
 Chief Complaint  Patient presents with   Acute Visit    Patient presents today for watery eyes, cough, congestion and sinus pressure for 2-3 weeks now.    [name] here for URI complaints.  Duration:  2-3  weeks  Associated symptoms: sinus congestion, sinus pain, rhinorrhea, itchy watery eyes, ear fullness, wheezing, shortness of breath, and coughing Denies: ear pain, ear drainage, sore throat, myalgia, and fevers, dental pain Treatment to date: Pat[place], Mucinex, SABA, INCS, Astelin nasal spray Sick contacts: No  Past Medical History:  Diagnosis Date   Acute medial meniscal tear, left, initial encounter    Anxiety    Diabetes mellitus    GERD (gastroesophageal reflux disease)    Hyperlipidemia    Hypertension    Irritable bowel    Spastic colon    anaphylaxis due to Keflex 1988    Objective BP 128/72   Pulse 81   Temp 97.6 F (36.4 C)   Ht 5\' 9"  (1.753 m)   Wt 194 lb 12.8 oz (88.4 kg)   SpO2 98%   BMI 28.77 kg/m  General: Awake, alert, appears stated age HEENT: AT, [place], ears patent b/l and TM's neg, nares patent w/o discharge, pharynx pink and without exudates, MMM, mild TTP over the left maxillary sinus Neck: No masses or asymmetry Heart: RRR Lungs: Faint expiratory wheezes heard at the bases, no accessory muscle use Psych: Age appropriate judgment and insight, normal mood and affect  Mild intermittent asthma with acute exacerbation - Plan: predniSONE (DELTASONE) 20 MG tablet  Acute maxillary sinusitis, recurrence not specified - Plan: doxycycline (VIBRA-TABS) 100 MG tablet  Exacerbation of chronic issue.  5-day prednisone burst 40 mg daily.  Hopefully this helps with sinusitis as well.  No improvement next 2 days, will start taking a 7-day course of doxycycline.  Anaphylaxis with cephalosporins makes me nervous to prescribe Augmentin.  Continue to push fluids, practice good hand hygiene, cover mouth when coughing. F/u prn. If starting to experience fevers,  shaking, or shortness of breath, seek immediate care. Pt voiced understanding and agreement to the plan.  Bruce Romero [place], DO 05/17/23 3:00 PM

## 2023-05-18 ENCOUNTER — Encounter: Payer: Self-pay | Admitting: Family Medicine

## 2023-05-18 ENCOUNTER — Other Ambulatory Visit: Payer: Self-pay | Admitting: Family Medicine

## 2023-05-18 DIAGNOSIS — F411 Generalized anxiety disorder: Secondary | ICD-10-CM

## 2023-05-18 NOTE — Telephone Encounter (Signed)
 Requesting: Xanax Contract: 09/29/2022 UDS: 09/29/2022 Last OV: 05/17/2023 Next OV: n/a Last Refill: 09/29/2022, #90--1 RF Database:   Please advise

## 2023-05-19 NOTE — Telephone Encounter (Signed)
 Duplicate request

## 2023-05-22 ENCOUNTER — Other Ambulatory Visit: Payer: Self-pay | Admitting: Family Medicine

## 2023-05-22 DIAGNOSIS — F411 Generalized anxiety disorder: Secondary | ICD-10-CM

## 2023-05-22 DIAGNOSIS — K219 Gastro-esophageal reflux disease without esophagitis: Secondary | ICD-10-CM

## 2023-05-22 MED ORDER — ALPRAZOLAM 0.25 MG PO TABS: ORAL_TABLET | ORAL | 1 refills | Status: DC

## 2023-06-05 ENCOUNTER — Encounter: Payer: Self-pay | Admitting: Family Medicine

## 2023-06-05 ENCOUNTER — Ambulatory Visit (INDEPENDENT_AMBULATORY_CARE_PROVIDER_SITE_OTHER): Admitting: Family Medicine

## 2023-06-05 VITALS — BP 120/60 | HR 67 | Temp 98.0°F | Resp 16 | Ht 69.0 in | Wt 190.4 lb

## 2023-06-05 DIAGNOSIS — N410 Acute prostatitis: Secondary | ICD-10-CM

## 2023-06-05 DIAGNOSIS — R350 Frequency of micturition: Secondary | ICD-10-CM | POA: Diagnosis not present

## 2023-06-05 LAB — POC URINALSYSI DIPSTICK (AUTOMATED)
Bilirubin, UA: NEGATIVE
Blood, UA: NEGATIVE
Glucose, UA: NEGATIVE
Ketones, UA: NEGATIVE
Leukocytes, UA: NEGATIVE
Nitrite, UA: NEGATIVE
Protein, UA: NEGATIVE
Spec Grav, UA: <=1.005 — AB (ref 1.010–1.025)
Urobilinogen, UA: 0.2 U/dL
pH, UA: 6.5 (ref 5.0–8.0)

## 2023-06-05 MED ORDER — LEVOFLOXACIN 500 MG PO TABS
500.0000 mg | ORAL_TABLET | Freq: Every day | ORAL | 0 refills | Status: AC
Start: 2023-06-05 — End: 2023-06-12

## 2023-06-05 NOTE — Progress Notes (Signed)
 Established Patient Office Visit  Subjective   Patient ID: Bruce Romero, male    DOB: 1946-01-03  Age: 78 y.o. MRN: [id number]  Chief Complaint  Patient presents with   Urinary Frequency    Pt states having to get up at night two nights in a row. No burning. Lower back pain    HPI Discussed the use of AI scribe software for clinical note transcription with the patient, who gave verbal consent to proceed.  History of Present Illness Bruce Romero "Bruce Romero" is a 78 year old male with recurrent prostatitis who presents with urinary frequency and low back pain.  He experiences urinary frequency, getting up four to five times at night to urinate, and has pain in the lower back and bottom. No fever is present. His last episode of prostatitis was in July of the previous year, treated with Levaquin . He has had multiple episodes in the past, treated with various antibiotics, including doxycycline  and Levaquin , but notes that Cipro  does not agree with him.  He has a history of asthma, treated with prednisone  and antibiotics earlier this year. His blood sugar levels have been under control, with recent readings around 6.9. He notes a seasonal variation in his activity levels affecting his blood sugar.  He uses Celebrex  for arthritis pain but is cautious with NSAIDs due to concerns about side effects. He also takes Xanax  as needed to help with sleep difficulties, particularly when he has trouble falling asleep.   Patient Active Problem List   Diagnosis Date Noted   Squamous cell carcinoma in situ (SCCIS) of skin of cheek 11/18/2016    Priority: High    Class: Diagnosis of   Preventative health care 06/27/2016    Priority: High   Generalized anxiety disorder 09/29/2022   Extrinsic asthma 09/29/2022   Seasonal allergies 06/21/2022   Viral upper respiratory tract infection 02/15/2021   Anxiety 03/30/2018   Essential hypertension 03/30/2018   Hyperlipidemia associated with type 2  diabetes mellitus (HCC) 03/30/2018   Uncontrolled type 2 diabetes mellitus with hyperglycemia (HCC) 03/30/2018   Diabetes (HCC) 04/26/2017   Corneal abrasion of both eyes 02/26/2016   Insomnia 05/09/2014   Allergic rhinitis 05/09/2014   RLS (restless legs syndrome) 01/21/2014   Bronchospasm 12/12/2012   Sinusitis, acute 11/29/2012   Need for prophylactic vaccination and inoculation against influenza 11/29/2012   Abscess 11/21/2011   OA (osteoarthritis of spine) 02/11/2011   Palpitations 12/30/2010   Abdominal pain, other specified site 04/28/2010   POSTMENOPAUSAL STATUS 02/10/2010   SHOULDER PAIN, RIGHT 11/03/2009   FREQUENCY, URINARY 11/03/2009   CERUMEN IMPACTION, BILATERAL 05/26/2009   UNSPECIFIED VITAMIN D  DEFICIENCY 05/13/2008   Hyperlipidemia LDL goal <100 05/13/2008   ESSENTIAL HYPERTENSION, BENIGN 05/13/2008   URI 01/22/2007   HEMATURIA 10/24/2006   PROSTATITIS, ACUTE 10/24/2006   NASAL POLYP 07/04/2006   REFLUX, ESOPHAGEAL 07/04/2006   FIBROMYALGIA 07/04/2006   Past Medical History:  Diagnosis Date   Acute medial meniscal tear, left, initial encounter    Anxiety    Diabetes mellitus    GERD (gastroesophageal reflux disease)    Hyperlipidemia    Hypertension    Irritable bowel    Spastic colon    anaphylaxis due to Keflex 1988   Past Surgical History:  Procedure Laterality Date   CHOLECYSTECTOMY     CHONDROPLASTY Left 01/15/2016   Procedure: CHONDROPLASTY;  Surgeon: Dayne Even, MD;  Location: [facility];  Service: Orthopedics;  Laterality: Left;   KNEE ARTHROSCOPY  WITH MEDIAL MENISECTOMY Left 01/15/2016   Procedure: ARTHROSCOPY LEFT KNEE WITH PARTIAL MEDIAL MENISECTOMY;  Surgeon: Dayne Even, MD;  Location: [facility];  Service: Orthopedics;  Laterality: Left;   NASAL SINUS SURGERY     Social History   Tobacco Use   Smoking status: Former   Smokeless tobacco: Never  Substance Use Topics   Alcohol use: Yes     Alcohol/week: 0.0 standard drinks of alcohol    Comment: social   Drug use: No   Social History   Socioeconomic History   Marital status: Married    Spouse name: Not on file   Number of children: Not on file   Years of education: Not on file   Highest education level: Not on file  Occupational History   Occupation: retired  Tobacco Use   Smoking status: Former   Smokeless tobacco: Never  Substance and Sexual Activity   Alcohol use: Yes    Alcohol/week: 0.0 standard drinks of alcohol    Comment: social   Drug use: No   Sexual activity: Yes  Other Topics Concern   Not on file  Social History Narrative   Exercise try to everyday.   Social Drivers of Corporate investment banker Strain: Not on file  Food Insecurity: Not on file  Transportation Needs: Not on file  Physical Activity: Not on file  Stress: Not on file  Social Connections: Not on file  Intimate Partner Violence: Not on file   Family Status  Relation Name Status   Mother  Deceased   Father  Deceased   Brother  Deceased at age 60   Brother  Alive   Sister  Alive  No partnership data on file   Family History  Problem Relation Age of Onset   Stroke Mother    Pulmonary embolism Father    Diabetes Brother    Allergies  Allergen Reactions   Cephalexin Anaphylaxis    REACTION: pt is not allergic to penicillin   Bactrim [Sulfamethoxazole -Trimethoprim] Other (See Comments)    ? Took Bactrim and Flexeril  at same time had lip swelling unsure which caused reaction.   Bromocriptine  Nausea And Vomiting   Flexeril  [Cyclobenzaprine ] Swelling   Invokana  [Canagliflozin ]     balanitis      Review of Systems  Constitutional:  Negative for fever and malaise/fatigue.  HENT:  Negative for congestion.   Eyes:  Negative for blurred vision.  Respiratory:  Negative for cough and shortness of breath.   Cardiovascular:  Negative for chest pain, palpitations and leg swelling.  Gastrointestinal:  Negative for abdominal  pain, blood in stool, nausea and vomiting.  Genitourinary:  Positive for dysuria, flank pain, frequency and urgency.  Musculoskeletal:  Negative for back pain and falls.  Skin:  Negative for rash.  Neurological:  Negative for dizziness, loss of consciousness and headaches.  Endo/Heme/Allergies:  Negative for environmental allergies.  Psychiatric/Behavioral:  Negative for depression. The patient is not nervous/anxious.       Objective:     BP 120/60 (BP Location: Left Arm, Patient Position: Sitting, Cuff Size: Normal)   Pulse 67   Temp 98 F (36.7 C) (Oral)   Resp 16   Ht 5\' 9"  (1.753 m)   Wt 190 lb 6.4 oz (86.4 kg)   SpO2 97%   BMI 28.12 kg/m  BP Readings from Last 3 Encounters:  06/05/23 120/60  05/17/23 128/72  04/19/23 120/76   Wt Readings from Last 3 Encounters:  06/05/23 190 lb  6.4 oz (86.4 kg)  05/17/23 194 lb 12.8 oz (88.4 kg)  04/19/23 193 lb (87.5 kg)   SpO2 Readings from Last 3 Encounters:  06/05/23 97%  05/17/23 98%  04/19/23 99%      Physical Exam Vitals and nursing note reviewed.  Constitutional:      General: He is not in acute distress.    Appearance: Normal appearance. He is well-developed.  HENT:     Head: Normocephalic and atraumatic.  Eyes:     General: No scleral icterus.       Right eye: No discharge.        Left eye: No discharge.  Cardiovascular:     Rate and Rhythm: Normal rate and regular rhythm.     Heart sounds: No murmur heard. Pulmonary:     Effort: Pulmonary effort is normal. No respiratory distress.     Breath sounds: Normal breath sounds.  Abdominal:     Tenderness: There is abdominal tenderness in the suprapubic area.  Musculoskeletal:        General: Normal range of motion.     Cervical back: Normal range of motion and neck supple.     Right lower leg: No edema.     Left lower leg: No edema.  Skin:    General: Skin is warm and dry.  Neurological:     Mental Status: He is alert and oriented to person, place, and time.   Psychiatric:        Mood and Affect: Mood normal.        Behavior: Behavior normal.        Thought Content: Thought content normal.        Judgment: Judgment normal.      Results for orders placed or performed in visit on 06/05/23  POCT Urinalysis Dipstick (Automated)  Result Value Ref Range   Color, UA yellow    Clarity, UA clear    Glucose, UA Negative Negative   Bilirubin, UA negative    Ketones, UA negative    Spec Grav, UA <=1.005 (A) 1.010 - 1.025   Blood, UA negative    pH, UA 6.5 5.0 - 8.0   Protein, UA Negative Negative   Urobilinogen, UA 0.2 0.2 or 1.0 E.U./dL   Nitrite, UA negative    Leukocytes, UA Negative Negative    Last CBC Lab Results  Component Value Date   WBC 8.5 09/29/2022   HGB 12.8 (L) 09/29/2022   HCT 39.3 09/29/2022   MCV 87.1 09/29/2022   MCH 31.2 12/31/2010   RDW 14.4 09/29/2022   PLT 212.0 09/29/2022   Last metabolic panel Lab Results  Component Value Date   GLUCOSE 94 09/29/2022   NA 133 (L) 09/29/2022   K 4.3 09/29/2022   CL 96 09/29/2022   CO2 28 09/29/2022   BUN 14 09/29/2022   CREATININE 0.90 09/29/2022   GFR 82.00 09/29/2022   CALCIUM  9.1 09/29/2022   PROT 6.8 09/29/2022   ALBUMIN 4.3 09/29/2022   BILITOT 0.9 09/29/2022   ALKPHOS 45 09/29/2022   AST 26 09/29/2022   ALT 18 09/29/2022   Last lipids Lab Results  Component Value Date   CHOL 107 09/29/2022   HDL 53.10 09/29/2022   LDLCALC 32 09/29/2022   LDLDIRECT 74.0 03/30/2018   TRIG 108.0 09/29/2022   CHOLHDL 2 09/29/2022   Last hemoglobin A1c Lab Results  Component Value Date   HGBA1C 6.9 (A) 04/19/2023   Last thyroid  functions Lab Results  Component Value Date  TSH 2.28 09/04/2019   T4TOTAL 9.7 12/30/2010   Last vitamin D  Lab Results  Component Value Date   VD25OH 65.43 09/29/2022   Last vitamin B12 and Folate No results found for: "VITAMINB12", "FOLATE"    The ASCVD Risk score (Arnett DK, et al., 2019) failed to calculate for the following  reasons:   The valid total cholesterol range is 130 to 320 mg/dL    Assessment & Plan:   Problem List Items Addressed This Visit       Unprioritized   FREQUENCY, URINARY   Relevant Orders   POCT Urinalysis Dipstick (Automated) (Completed)   Urine Culture   PROSTATITIS, ACUTE - Primary   Relevant Medications   levofloxacin  (LEVAQUIN ) 500 MG tablet  Assessment and Plan Assessment & Plan Prostatitis   He experiences recurrent prostatitis with increased urinary frequency (4-5 times per night) and low back pain, but no fever. Previous treatment with Levaquin  was effective. Current urinalysis shows no significant findings, and urine culture is pending. Symptoms are consistent with past episodes. Prescribe Levaquin  for 7 days. Order urine culture.  Type 2 diabetes mellitus without complications   His type 2 diabetes mellitus is well-controlled. Recent HbA1c is 6.9%, and blood glucose levels have been consistently under 7.0% for the past two to three years.    No follow-ups on file.    Masae Lukacs R Lowne Chase, DO

## 2023-06-07 LAB — URINE CULTURE
MICRO NUMBER:: 16388993
Result:: NO GROWTH
SPECIMEN QUALITY:: ADEQUATE

## 2023-06-10 ENCOUNTER — Other Ambulatory Visit: Payer: Self-pay | Admitting: Family Medicine

## 2023-06-10 DIAGNOSIS — I10 Essential (primary) hypertension: Secondary | ICD-10-CM

## 2023-06-12 ENCOUNTER — Other Ambulatory Visit: Payer: Self-pay | Admitting: Family Medicine

## 2023-06-12 ENCOUNTER — Encounter: Payer: Self-pay | Admitting: Family Medicine

## 2023-06-12 DIAGNOSIS — J302 Other seasonal allergic rhinitis: Secondary | ICD-10-CM

## 2023-06-23 ENCOUNTER — Other Ambulatory Visit: Payer: Self-pay | Admitting: Family Medicine

## 2023-06-23 ENCOUNTER — Other Ambulatory Visit: Payer: Self-pay | Admitting: Internal Medicine

## 2023-06-23 DIAGNOSIS — E119 Type 2 diabetes mellitus without complications: Secondary | ICD-10-CM

## 2023-07-20 ENCOUNTER — Other Ambulatory Visit: Payer: Self-pay | Admitting: Family Medicine

## 2023-07-20 DIAGNOSIS — I10 Essential (primary) hypertension: Secondary | ICD-10-CM

## 2023-07-25 ENCOUNTER — Encounter: Payer: Self-pay | Admitting: Family Medicine

## 2023-07-25 ENCOUNTER — Ambulatory Visit (INDEPENDENT_AMBULATORY_CARE_PROVIDER_SITE_OTHER): Admitting: Family Medicine

## 2023-07-25 VITALS — BP 128/72 | HR 76 | Temp 97.9°F | Resp 16 | Ht 69.0 in | Wt 188.0 lb

## 2023-07-25 DIAGNOSIS — H6992 Unspecified Eustachian tube disorder, left ear: Secondary | ICD-10-CM

## 2023-07-25 DIAGNOSIS — K529 Noninfective gastroenteritis and colitis, unspecified: Secondary | ICD-10-CM | POA: Diagnosis not present

## 2023-07-25 MED ORDER — PREDNISONE 20 MG PO TABS: 40.0000 mg | ORAL_TABLET | Freq: Every day | ORAL | 0 refills | Status: AC

## 2023-07-25 MED ORDER — ONDANSETRON 4 MG PO TBDP: 4.0000 mg | ORAL_TABLET | Freq: Three times a day (TID) | ORAL | 0 refills | Status: DC | PRN

## 2023-07-25 NOTE — Progress Notes (Signed)
 Chief Complaint  Patient presents with   Diarrhea    Diarrhea and Nausea    Nausea    Pt is here for left ear pain. Duration: 2-3 days Progression: unchanged Associated symptoms: congestion, diarrhea, abd pain, nausea, and sinus pressure Denies: coryza, bleeding, fevers, sick contacts, recent abx use, or discharge from ear Treatment to date: Pepto-Bismol, Debrox  Past Medical History:  Diagnosis Date   Acute medial meniscal tear, left, initial encounter    Anxiety    Diabetes mellitus    GERD (gastroesophageal reflux disease)    Hyperlipidemia    Hypertension    Irritable bowel    Spastic colon    anaphylaxis due to Keflex 1988    BP 128/72 (BP Location: Left Arm, Patient Position: Sitting)   Pulse 76   Temp 97.9 F (36.6 C) (Oral)   Resp 16   Ht 5' 9 (1.753 m)   Wt 188 lb (85.3 kg)   SpO2 96%   BMI 27.76 kg/m  General: Awake, alert, appearing stated age HEENT:  L ear- Canal patent without drainage or erythema, TM is neg R ear- canal patent without drainage or erythema, TM is neg Nose- nares patent and without discharge Mouth- Lips, gums and dentition unremarkable, pharynx is without erythema or exudate Neck: No adenopathy Heart: RRR Abd: BS+, S, NT, ND Lungs: CTAB. Normal effort, no accessory muscle use Psych: Age appropriate judgment and insight, normal mood and affect  ETD (Eustachian tube dysfunction), left  Gastroenteritis - Plan: ondansetron  (ZOFRAN -ODT) 4 MG disintegrating tablet  5 d pred burst 40 mg/d. Sugars controlled per pt. Consider INCS in future.  Zofran  prn. Fluids w electrolytes. Send message if no improvement, will ck GI profile before weekend.  F/u prn.  Pt voiced understanding and agreement to the plan.  Shellie Dials [place], DO 07/25/23 2:28 PM

## 2023-07-25 NOTE — Patient Instructions (Signed)
 As long as you are vomiting or having diarrhea, please drink fluids with electrolytes like Gatorade, Powerade, or Pedialyte (if you can stomach the taste). Drink these along with water mainly.   Send me a message in a couple days if diarrhea still bothersome.   OK to take Tylenol  1000 mg (2 extra strength tabs) or 975 mg (3 regular strength tabs) every 6 hours as needed.  Let us  know if you need anything.

## 2023-07-27 ENCOUNTER — Encounter: Payer: Self-pay | Admitting: Family Medicine

## 2023-08-17 DIAGNOSIS — C44629 Squamous cell carcinoma of skin of left upper limb, including shoulder: Secondary | ICD-10-CM | POA: Diagnosis not present

## 2023-08-17 DIAGNOSIS — L570 Actinic keratosis: Secondary | ICD-10-CM | POA: Diagnosis not present

## 2023-08-17 DIAGNOSIS — L578 Other skin changes due to chronic exposure to nonionizing radiation: Secondary | ICD-10-CM | POA: Diagnosis not present

## 2023-08-17 DIAGNOSIS — D485 Neoplasm of uncertain behavior of skin: Secondary | ICD-10-CM | POA: Diagnosis not present

## 2023-09-12 DIAGNOSIS — C44629 Squamous cell carcinoma of skin of left upper limb, including shoulder: Secondary | ICD-10-CM | POA: Diagnosis not present

## 2023-09-12 DIAGNOSIS — L905 Scar conditions and fibrosis of skin: Secondary | ICD-10-CM | POA: Diagnosis not present

## 2023-09-14 DIAGNOSIS — Z09 Encounter for follow-up examination after completed treatment for conditions other than malignant neoplasm: Secondary | ICD-10-CM | POA: Diagnosis not present

## 2023-09-14 DIAGNOSIS — D2262 Melanocytic nevi of left upper limb, including shoulder: Secondary | ICD-10-CM | POA: Diagnosis not present

## 2023-09-14 DIAGNOSIS — D2261 Melanocytic nevi of right upper limb, including shoulder: Secondary | ICD-10-CM | POA: Diagnosis not present

## 2023-09-14 DIAGNOSIS — L814 Other melanin hyperpigmentation: Secondary | ICD-10-CM | POA: Diagnosis not present

## 2023-09-14 DIAGNOSIS — L218 Other seborrheic dermatitis: Secondary | ICD-10-CM | POA: Diagnosis not present

## 2023-09-14 DIAGNOSIS — L578 Other skin changes due to chronic exposure to nonionizing radiation: Secondary | ICD-10-CM | POA: Diagnosis not present

## 2023-09-14 DIAGNOSIS — L570 Actinic keratosis: Secondary | ICD-10-CM | POA: Diagnosis not present

## 2023-09-18 ENCOUNTER — Other Ambulatory Visit: Payer: Self-pay | Admitting: Family Medicine

## 2023-09-18 DIAGNOSIS — J302 Other seasonal allergic rhinitis: Secondary | ICD-10-CM

## 2023-09-22 ENCOUNTER — Ambulatory Visit (INDEPENDENT_AMBULATORY_CARE_PROVIDER_SITE_OTHER): Admitting: Family Medicine

## 2023-09-22 ENCOUNTER — Encounter: Payer: Self-pay | Admitting: Family Medicine

## 2023-09-22 ENCOUNTER — Other Ambulatory Visit: Payer: Self-pay | Admitting: Family Medicine

## 2023-09-22 VITALS — BP 134/80 | HR 65 | Temp 98.0°F | Resp 16 | Ht 69.0 in | Wt 187.2 lb

## 2023-09-22 DIAGNOSIS — K219 Gastro-esophageal reflux disease without esophagitis: Secondary | ICD-10-CM | POA: Diagnosis not present

## 2023-09-22 DIAGNOSIS — E559 Vitamin D deficiency, unspecified: Secondary | ICD-10-CM | POA: Diagnosis not present

## 2023-09-22 DIAGNOSIS — E119 Type 2 diabetes mellitus without complications: Secondary | ICD-10-CM

## 2023-09-22 DIAGNOSIS — I10 Essential (primary) hypertension: Secondary | ICD-10-CM

## 2023-09-22 DIAGNOSIS — R350 Frequency of micturition: Secondary | ICD-10-CM

## 2023-09-22 DIAGNOSIS — E785 Hyperlipidemia, unspecified: Secondary | ICD-10-CM

## 2023-09-22 DIAGNOSIS — Z0000 Encounter for general adult medical examination without abnormal findings: Secondary | ICD-10-CM | POA: Diagnosis not present

## 2023-09-22 DIAGNOSIS — K529 Noninfective gastroenteritis and colitis, unspecified: Secondary | ICD-10-CM

## 2023-09-22 DIAGNOSIS — J4520 Mild intermittent asthma, uncomplicated: Secondary | ICD-10-CM

## 2023-09-22 DIAGNOSIS — E1169 Type 2 diabetes mellitus with other specified complication: Secondary | ICD-10-CM | POA: Diagnosis not present

## 2023-09-22 MED ORDER — ONDANSETRON 4 MG PO TBDP: 4.0000 mg | ORAL_TABLET | Freq: Three times a day (TID) | ORAL | 0 refills | Status: DC | PRN

## 2023-09-22 NOTE — Assessment & Plan Note (Signed)
 Ghm utd Check labs  See AVS  Health Maintenance  Topic Date Due   Diabetic kidney evaluation - Urine ACR  09/02/2009   Medicare Annual Wellness (AWV)  07/12/2018   INFLUENZA VACCINE  09/08/2023   Diabetic kidney evaluation - eGFR measurement  09/29/2023   COVID-19 Vaccine (4 - 2024-25 season) 10/08/2023 (Originally 10/09/2022)   FOOT EXAM  10/19/2023   HEMOGLOBIN A1C  10/20/2023   DTaP/Tdap/Td (3 - Td or Tdap) 05/08/2024   OPHTHALMOLOGY EXAM  05/09/2024   Pneumococcal Vaccine: 50+ Years  Completed   Hepatitis C Screening  Completed   Zoster Vaccines- Shingrix  Completed   HPV VACCINES  Aged Out   Meningococcal B Vaccine  Aged Out   Pneumococcal Vaccine  Discontinued   Colonoscopy  Discontinued

## 2023-09-22 NOTE — Assessment & Plan Note (Signed)
 Well controlled, no changes to meds. Encouraged heart healthy diet such as the DASH diet and exercise as tolerated.

## 2023-09-22 NOTE — Progress Notes (Signed)
 +                                                                                         Subjective:    Patient ID: AZUL BRUMETT, male    DOB: Dec 13, 1945, 78 y.o.   MRN: [id number]  Chief Complaint  Patient presents with   Annual Exam    Pt states not fasting     HPI Patient is in today for cpe. Discussed the use of AI scribe software for clinical note transcription with the patient, who gave verbal consent to proceed.  History of Present Illness Virat Prather is a 78 year old male who presents for an annual physical exam.  He is using fluorouracil cream for actinic keratosis on his face, which has caused redness. He recently visited his dermatologist for this condition, and the treatment appears to be effective.  He experiences occasional stomach upset a day or two after taking Mounjaro (tirzepatide) for diabetes management. He requests Zofran to have on standby for these episodes, although the stomach upset is not frequent. His diabetes management is going well, with HbA1c levels remaining under seven. He confirms regular eye and foot checks but notes that his urine is not usually checked for protein.  He has not engaged in any significant activities over the summer, staying close to home to care for Devere, who requires assistance due to fluctuating alertness and mobility issues. Devere is currently receiving hospice care and has a caregiver, but additional help is sometimes needed. He has explored various caregiving options, including agencies and students from a local program, to manage the high costs of care.    Past Medical History:  Diagnosis Date   Acute medial meniscal tear, left, initial encounter    Anxiety    Diabetes mellitus    GERD (gastroesophageal reflux disease)    Hyperlipidemia    Hypertension    Irritable bowel    Spastic colon     anaphylaxis due to Keflex 1988    Past Surgical History:  Procedure Laterality Date   CHOLECYSTECTOMY     CHONDROPLASTY Left 01/15/2016   Procedure: CHONDROPLASTY;  Surgeon: Maude Herald, MD;  Location: [facility];  Service: Orthopedics;  Laterality: Left;   KNEE ARTHROSCOPY WITH MEDIAL MENISECTOMY Left 01/15/2016   Procedure: ARTHROSCOPY LEFT KNEE WITH PARTIAL MEDIAL MENISECTOMY;  Surgeon: Maude Herald, MD;  Location: [facility];  Service: Orthopedics;  Laterality: Left;   NASAL SINUS SURGERY      Family History  Problem Relation Age of Onset   Stroke Mother    Pulmonary embolism Father    Diabetes Brother     Social History   Socioeconomic History   Marital status: Married    Spouse name: Not on file   Number of children: Not on file   Years of education: Not on file   Highest education level: Not on file  Occupational History   Occupation: retired  Tobacco Use   Smoking status: Former   Smokeless tobacco: Never  Substance and Sexual Activity  Alcohol use: Yes    Alcohol/week: 0.0 standard drinks of alcohol    Comment: social   Drug use: No   Sexual activity: Yes  Other Topics Concern   Not on file  Social History Narrative   Exercise try to everyday.   Social Drivers of Corporate investment banker Strain: Not on file  Food Insecurity: Not on file  Transportation Needs: Not on file  Physical Activity: Not on file  Stress: Not on file  Social Connections: Not on file  Intimate Partner Violence: Not on file    Outpatient Medications Prior to Visit  Medication Sig Dispense Refill   albuterol (VENTOLIN HFA) 108 (90 Base) MCG/ACT inhaler Inhale 2 puffs into the lungs every 6 (six) hours as needed for wheezing or shortness of breath. 8 g 0   ALPRAZolam (XANAX) 0.25 MG tablet TAKE 1 TABLET BY MOUTH THREE TIMES DAILY AS NEEDED 90 tablet 1   amLODipine (NORVASC) 10 MG tablet Take 1 tablet (10 mg total) by mouth daily. 90 tablet 0    atorvastatin (LIPITOR) 10 MG tablet TAKE 1 TABLET BY MOUTH DAILY 90 tablet 1   azelastine (ASTELIN) 0.1 % nasal spray PLACE 1 SPRAY IN EACH NOSTRIL TWICE DAILY 30 mL 2   candesartan (ATACAND) 16 MG tablet TAKE 1 TABLET(16 MG) BY MOUTH AT BEDTIME 90 tablet 1   celecoxib (CELEBREX) 200 MG capsule Take 1 capsule (200 mg total) by mouth 2 (two) times daily. 90 capsule 3   chlorpheniramine-HYDROcodone (TUSSIONEX) 10-8 MG/5ML Take 5 mLs by mouth at bedtime as needed for cough. 115 mL 0   Cholecalciferol (VITAMIN D) 2000 UNITS tablet Take 2,000 Units by mouth daily.     co-enzyme Q-10 30 MG capsule Take 100 mg by mouth daily.     erythromycin ophthalmic ointment Place 1 Application into both eyes as needed.     esomeprazole (NEXIUM) 40 MG capsule TAKE 1 CAPSULE(40 MG) BY MOUTH DAILY 90 capsule 1   FLUAD 0.5 ML injection      fluticasone (FLONASE) 50 MCG/ACT nasal spray Place into both nostrils daily.     Insulin Pen Needle 32G X 6 MM MISC Use as directed with insulin pen 30 each 3   linaclotide (LINZESS) 290 MCG CAPS capsule Take 1 capsule (290 mcg total) by mouth daily before breakfast. 90 capsule 3   linaclotide (LINZESS) 290 MCG CAPS capsule TAKE 1 CAPSULE(290 MCG) BY MOUTH DAILY BEFORE BREAKFAST 90 capsule 1   meloxicam (MOBIC) 7.5 MG tablet Take 1 tablet (7.5 mg total) by mouth daily. 30 tablet 0   metFORMIN (GLUCOPHAGE-XR) 750 MG 24 hr tablet Take 1 tablet (750 mg total) by mouth 2 (two) times daily. 180 tablet 3   montelukast (SINGULAIR) 10 MG tablet TAKE 1 TABLET(10 MG) BY MOUTH AT BEDTIME 90 tablet 0   niacin (SLO-NIACIN) 500 MG tablet Take 500 mg by mouth at bedtime.     ofloxacin (FLOXIN OTIC) 0.3 % OTIC solution Place 10 drops into the right ear daily. 5 mL 0   ONETOUCH VERIO test strip TEST ONCE DAILY 100 strip 3   rOPINIRole (REQUIP) 1 MG tablet Take 1 tablet (1 mg total) by mouth at bedtime. 90 tablet 3   tamsulosin (FLOMAX) 0.4 MG CAPS capsule TAKE 1 CAPSULE(0.4 MG) BY MOUTH DAILY 90  capsule 3   tirzepatide (MOUNJARO) 5 MG/0.5ML Pen Inject 5 mg into the skin once a week. 6 mL 3   ondansetron (ZOFRAN-ODT) 4 MG disintegrating tablet Take 1  tablet (4 mg total) by mouth every 8 (eight) hours as needed for nausea or vomiting. 20 tablet 0   No facility-administered medications prior to visit.    Allergies  Allergen Reactions   Cephalexin Anaphylaxis    REACTION: pt is not allergic to penicillin   Bactrim [Sulfamethoxazole-Trimethoprim] Other (See Comments)    ? Took Bactrim and Flexeril at same time had lip swelling unsure which caused reaction.   Bromocriptine Nausea And Vomiting   Flexeril [Cyclobenzaprine] Swelling   Invokana [Canagliflozin]     balanitis    Review of Systems  Constitutional:  Negative for chills, fever and malaise/fatigue.  HENT:  Negative for congestion and hearing loss.   Eyes:  Negative for blurred vision and discharge.  Respiratory:  Negative for cough, sputum production and shortness of breath.   Cardiovascular:  Negative for chest pain, palpitations and leg swelling.  Gastrointestinal:  Negative for abdominal pain, blood in stool, constipation, diarrhea, heartburn, nausea and vomiting.  Genitourinary:  Negative for dysuria, frequency, hematuria and urgency.  Musculoskeletal:  Negative for back pain, falls and myalgias.  Skin:  Negative for rash.  Neurological:  Negative for dizziness, sensory change, loss of consciousness, weakness and headaches.  Endo/Heme/Allergies:  Negative for environmental allergies. Does not bruise/bleed easily.  Psychiatric/Behavioral:  Negative for depression and suicidal ideas. The patient is not nervous/anxious and does not have insomnia.        Objective:    Physical Exam Vitals and nursing note reviewed.  Constitutional:      General: He is not in acute distress.    Appearance: Normal appearance. He is well-developed.  HENT:     Head: Normocephalic and atraumatic.     Right Ear: Tympanic membrane, ear  canal and external ear normal. There is no impacted cerumen.     Left Ear: Tympanic membrane, ear canal and external ear normal. There is no impacted cerumen.     Nose: Nose normal.     Mouth/Throat:     Mouth: Mucous membranes are moist.     Pharynx: Oropharynx is clear. No oropharyngeal exudate or posterior oropharyngeal erythema.  Eyes:     General: No scleral icterus.       Right eye: No discharge.        Left eye: No discharge.     Conjunctiva/sclera: Conjunctivae normal.     Pupils: Pupils are equal, round, and reactive to light.  Neck:     Thyroid: No thyromegaly.     Vascular: No JVD.  Cardiovascular:     Rate and Rhythm: Normal rate and regular rhythm.     Heart sounds: Normal heart sounds. No murmur heard. Pulmonary:     Effort: Pulmonary effort is normal. No respiratory distress.     Breath sounds: Normal breath sounds.  Abdominal:     General: Bowel sounds are normal. There is no distension.     Palpations: Abdomen is soft. There is no mass.     Tenderness: There is no abdominal tenderness. There is no guarding or rebound.  Musculoskeletal:        General: Normal range of motion.     Cervical back: Normal range of motion and neck supple.     Right lower leg: No edema.     Left lower leg: No edema.  Lymphadenopathy:     Cervical: No cervical adenopathy.  Skin:    General: Skin is warm and dry.     Findings: No erythema or rash.  Neurological:  Mental Status: He is alert and oriented to person, place, and time.     Cranial Nerves: No cranial nerve deficit.     Motor: No abnormal muscle tone.     Deep Tendon Reflexes: Reflexes are normal and symmetric. Reflexes normal.  Psychiatric:        Mood and Affect: Mood normal.        Behavior: Behavior normal.        Thought Content: Thought content normal.        Judgment: Judgment normal.     BP 134/80 (BP Location: Left Arm, Patient Position: Sitting, Cuff Size: Large)   Pulse 65   Temp 98 F (36.7 C) (Oral)    Resp 16   Ht 5' 9 (1.753 m)   Wt 187 lb 3.2 oz (84.9 kg)   SpO2 98%   BMI 27.64 kg/m  Wt Readings from Last 3 Encounters:  09/22/23 187 lb 3.2 oz (84.9 kg)  07/25/23 188 lb (85.3 kg)  06/05/23 190 lb 6.4 oz (86.4 kg)    Diabetic Foot Exam - Simple   No data filed    Lab Results  Component Value Date   WBC 8.5 09/29/2022   HGB 12.8 (L) 09/29/2022   HCT 39.3 09/29/2022   PLT 212.0 09/29/2022   GLUCOSE 94 09/29/2022   CHOL 107 09/29/2022   TRIG 108.0 09/29/2022   HDL 53.10 09/29/2022   LDLDIRECT 74.0 03/30/2018   LDLCALC 32 09/29/2022   ALT 18 09/29/2022   AST 26 09/29/2022   NA 133 (L) 09/29/2022   K 4.3 09/29/2022   CL 96 09/29/2022   CREATININE 0.90 09/29/2022   BUN 14 09/29/2022   CO2 28 09/29/2022   TSH 2.28 09/04/2019   PSA 0.33 04/08/2021   HGBA1C 6.9 (A) 04/19/2023   MICROALBUR 0.2 09/02/2008    Lab Results  Component Value Date   TSH 2.28 09/04/2019   Lab Results  Component Value Date   WBC 8.5 09/29/2022   HGB 12.8 (L) 09/29/2022   HCT 39.3 09/29/2022   MCV 87.1 09/29/2022   PLT 212.0 09/29/2022   Lab Results  Component Value Date   NA 133 (L) 09/29/2022   K 4.3 09/29/2022   CO2 28 09/29/2022   GLUCOSE 94 09/29/2022   BUN 14 09/29/2022   CREATININE 0.90 09/29/2022   BILITOT 0.9 09/29/2022   ALKPHOS 45 09/29/2022   AST 26 09/29/2022   ALT 18 09/29/2022   PROT 6.8 09/29/2022   ALBUMIN 4.3 09/29/2022   CALCIUM 9.1 09/29/2022   GFR 82.00 09/29/2022   Lab Results  Component Value Date   CHOL 107 09/29/2022   Lab Results  Component Value Date   HDL 53.10 09/29/2022   Lab Results  Component Value Date   LDLCALC 32 09/29/2022   Lab Results  Component Value Date   TRIG 108.0 09/29/2022   Lab Results  Component Value Date   CHOLHDL 2 09/29/2022   Lab Results  Component Value Date   HGBA1C 6.9 (A) 04/19/2023       Assessment & Plan:  Preventative health care Assessment & Plan: Ghm utd Check labs  See AVS  Health  Maintenance  Topic Date Due   Diabetic kidney evaluation - Urine ACR  09/02/2009   Medicare Annual Wellness (AWV)  07/12/2018   INFLUENZA VACCINE  09/08/2023   Diabetic kidney evaluation - eGFR measurement  09/29/2023   COVID-19 Vaccine (4 - 2024-25 season) 10/08/2023 (Originally 10/09/2022)   FOOT EXAM  10/19/2023   HEMOGLOBIN A1C  10/20/2023   DTaP/Tdap/Td (3 - Td or Tdap) 05/08/2024   OPHTHALMOLOGY EXAM  05/09/2024   Pneumococcal Vaccine: 50+ Years  Completed   Hepatitis C Screening  Completed   Zoster Vaccines- Shingrix  Completed   HPV VACCINES  Aged Out   Meningococcal B Vaccine  Aged Out   Pneumococcal Vaccine  Discontinued   Colonoscopy  Discontinued      Hypertension, unspecified type -     CBC with Differential/Platelet -     Comprehensive metabolic panel with GFR -     PSA -     TSH  Essential hypertension Assessment & Plan: Well controlled, no changes to meds. Encouraged heart healthy diet such as the DASH diet and exercise as tolerated.     Mild intermittent extrinsic asthma without complication  Type 2 diabetes mellitus without complication, without long-term current use of insulin (HCC) -     Microalbumin / creatinine urine ratio -     Hemoglobin A1c  Gastroesophageal reflux disease, unspecified whether esophagitis present -     Microalbumin / creatinine urine ratio  Hyperlipidemia associated with type 2 diabetes mellitus (HCC) -     Comprehensive metabolic panel with GFR -     Lipid panel  Vitamin D deficiency -     VITAMIN D 25 Hydroxy (Vit-D Deficiency, Fractures)  Urinary frequency -     PSA  Gastroenteritis -     Ondansetron; Take 1 tablet (4 mg total) by mouth every 8 (eight) hours as needed for nausea or vomiting.  Dispense: 20 tablet; Refill: 0  Hyperlipidemia LDL goal <100 Assessment & Plan: Encourage heart healthy diet such as MIND or DASH diet, increase exercise, avoid trans fats, simple carbohydrates and processed foods, consider a  krill or fish or flaxseed oil cap daily.     Assessment and Plan Assessment & Plan Adult Wellness Visit   He attended a visit with no acute concerns. Dermatological treatment with fluorouracil cream is causing facial redness. He did not request any medication refills except for ondansetron to manage nausea associated with Mounjaro use. Perform a physical examination and order routine laboratory tests.  Type 2 diabetes mellitus   His type 2 diabetes mellitus is well-controlled with an A1c under 7. There have been no recent urine protein checks. He has regular follow-ups with endocrinologist Dr. Idell and reports no significant issues with diabetes management. Check urine for protein and ensure regular foot and eye examinations.  Nausea associated with Mounjaro (tirzepatide) use   He experiences intermittent nausea a day or two after Mounjaro administration, which is less severe than with Ozempic. Ondansetron is requested for occasional use to manage symptoms. Prescribe ondansetron for nausea as needed.    Florean Hoobler R Lowne Chase, DO

## 2023-09-22 NOTE — Assessment & Plan Note (Signed)
 Encourage heart healthy diet such as MIND or DASH diet, increase exercise, avoid trans fats, simple carbohydrates and processed foods, consider a krill or fish or flaxseed oil cap daily.

## 2023-09-23 LAB — COMPREHENSIVE METABOLIC PANEL WITH GFR
AG Ratio: 1.8 (calc) (ref 1.0–2.5)
ALT: 17 U/L (ref 9–46)
AST: 20 U/L (ref 10–35)
Albumin: 4.2 g/dL (ref 3.6–5.1)
Alkaline phosphatase (APISO): 49 U/L (ref 35–144)
BUN: 12 mg/dL (ref 7–25)
CO2: 26 mmol/L (ref 20–32)
Calcium: 9.3 mg/dL (ref 8.6–10.3)
Chloride: 99 mmol/L (ref 98–110)
Creat: 0.86 mg/dL (ref 0.70–1.28)
Globulin: 2.4 g/dL (ref 1.9–3.7)
Glucose, Bld: 111 mg/dL — ABNORMAL HIGH (ref 65–99)
Potassium: 4.3 mmol/L (ref 3.5–5.3)
Sodium: 135 mmol/L (ref 135–146)
Total Bilirubin: 0.6 mg/dL (ref 0.2–1.2)
Total Protein: 6.6 g/dL (ref 6.1–8.1)
eGFR: 88 mL/min/1.73m2 (ref >=60)

## 2023-09-23 LAB — HEMOGLOBIN A1C
Hgb A1c MFr Bld: 7.2 % — ABNORMAL HIGH (ref <5.7)
Mean Plasma Glucose: 160 mg/dL
eAG (mmol/L): 8.9 mmol/L

## 2023-09-23 LAB — CBC WITH DIFFERENTIAL/PLATELET
Absolute Lymphocytes: 2374 {cells}/uL (ref 850–3900)
Absolute Monocytes: 722 {cells}/uL (ref 200–950)
Basophils Absolute: 58 {cells}/uL (ref 0–200)
Basophils Relative: 0.7 %
Eosinophils Absolute: 324 {cells}/uL (ref 15–500)
Eosinophils Relative: 3.9 %
HCT: 37.5 % — ABNORMAL LOW (ref 38.5–50.0)
Hemoglobin: 12.2 g/dL — ABNORMAL LOW (ref 13.2–17.1)
MCH: 28 pg (ref 27.0–33.0)
MCHC: 32.5 g/dL (ref 32.0–36.0)
MCV: 86 fL (ref 80.0–100.0)
MPV: 11.3 fL (ref 7.5–12.5)
Monocytes Relative: 8.7 %
Neutro Abs: 4822 {cells}/uL (ref 1500–7800)
Neutrophils Relative %: 58.1 %
Platelets: 202 Thousand/uL (ref 140–400)
RBC: 4.36 Million/uL (ref 4.20–5.80)
RDW: 13.5 % (ref 11.0–15.0)
Total Lymphocyte: 28.6 %
WBC: 8.3 Thousand/uL (ref 3.8–10.8)

## 2023-09-23 LAB — VITAMIN D 25 HYDROXY (VIT D DEFICIENCY, FRACTURES): Vit D, 25-Hydroxy: 71 ng/mL (ref 30–100)

## 2023-09-23 LAB — LIPID PANEL
Cholesterol: 99 mg/dL (ref <200)
HDL: 53 mg/dL (ref >=40)
LDL Cholesterol (Calc): 27 mg/dL
Non-HDL Cholesterol (Calc): 46 mg/dL (ref <130)
Total CHOL/HDL Ratio: 1.9 (calc) (ref <5.0)
Triglycerides: 109 mg/dL (ref <150)

## 2023-09-23 LAB — MICROALBUMIN / CREATININE URINE RATIO
Creatinine, Urine: 54 mg/dL (ref 20–320)
Microalb Creat Ratio: 4 mg/g{creat} (ref <30)
Microalb, Ur: 0.2 mg/dL

## 2023-09-23 LAB — PSA: PSA: 0.4 ng/mL (ref <=4.00)

## 2023-09-23 LAB — TSH: TSH: 1.43 m[IU]/L (ref 0.40–4.50)

## 2023-09-27 ENCOUNTER — Ambulatory Visit: Payer: Self-pay | Admitting: Family Medicine

## 2023-10-07 ENCOUNTER — Encounter: Payer: Self-pay | Admitting: Internal Medicine

## 2023-10-10 MED ORDER — TIRZEPATIDE 7.5 MG/0.5ML ~~LOC~~ SOAJ: 7.5000 mg | SUBCUTANEOUS | 2 refills | Status: DC

## 2023-10-12 ENCOUNTER — Telehealth: Payer: Self-pay | Admitting: Pharmacist

## 2023-10-12 MED ORDER — ATORVASTATIN CALCIUM 10 MG PO TABS
10.0000 mg | ORAL_TABLET | Freq: Every day | ORAL | 1 refills | Status: AC
Start: 2023-10-12 — End: ?

## 2023-10-12 MED ORDER — ATORVASTATIN CALCIUM 10 MG PO TABS: 10.0000 mg | ORAL_TABLET | Freq: Every day | ORAL | 1 refills | Status: DC

## 2023-10-12 NOTE — Progress Notes (Signed)
 Pharmacy Quality Measure Review  This patient is appearing on a report for being at risk of failing the adherence measure for cholesterol (statin) and hypertension (ACEi/ARB) medications this calendar year.   Medication: atorvastatin  10mg  Last fill date: 05/09/2023 for 90 day supply  Medication: candesartan  16 mg Last fill date: 06/08/2023 for 90 day supply  Spoke with patient. He states he is low on both atorvastatin  and candesartan . Asked for both to be refilled.  Reviewed medication indication, dosing, and goals of therapy.  and Contacted pharmacy to facilitate refills.  Madelin Ray, PharmD Clinical Pharmacist [facility]  Population Health [phone]

## 2023-10-16 ENCOUNTER — Ambulatory Visit (INDEPENDENT_AMBULATORY_CARE_PROVIDER_SITE_OTHER): Admitting: Family Medicine

## 2023-10-16 ENCOUNTER — Encounter: Payer: Self-pay | Admitting: Family Medicine

## 2023-10-16 VITALS — BP 130/80 | HR 83 | Temp 98.0°F | Resp 16 | Ht 69.0 in | Wt 184.0 lb

## 2023-10-16 DIAGNOSIS — M545 Low back pain, unspecified: Secondary | ICD-10-CM

## 2023-10-16 DIAGNOSIS — R222 Localized swelling, mass and lump, trunk: Secondary | ICD-10-CM

## 2023-10-16 DIAGNOSIS — L299 Pruritus, unspecified: Secondary | ICD-10-CM

## 2023-10-16 NOTE — Patient Instructions (Addendum)
 Consider baby powder in the groin region.   OK to take Tylenol  1000 mg (2 extra strength tabs) or 975 mg (3 regular strength tabs) every 6 hours as needed.  Heat (pad or rice pillow in microwave) over affected area, 10-15 minutes twice daily.   Ice/cold pack over area for 10-15 min twice daily.  Let us  know if you need anything.  EXERCISES  RANGE OF MOTION (ROM) AND STRETCHING EXERCISES - Low Back Pain Most people with lower back pain will find that their symptoms get worse with excessive bending forward (flexion) or arching at the lower back (extension). The exercises that will help resolve your symptoms will focus on the opposite motion.  If you have pain, numbness or tingling which travels down into your buttocks, leg or foot, the goal of the therapy is for these symptoms to move closer to your back and eventually resolve. Sometimes, these leg symptoms will get better, but your lower back pain may worsen. This is often an indication of progress in your rehabilitation. Be very alert to any changes in your symptoms and the activities in which you participated in the 24 hours prior to the change. Sharing this information with your caregiver will allow him or her to most efficiently treat your condition. These exercises may help you when beginning to rehabilitate your injury. Your symptoms may resolve with or without further involvement from your physician, physical therapist or athletic trainer. While completing these exercises, remember:  Restoring tissue flexibility helps normal motion to return to the joints. This allows healthier, less painful movement and activity. An effective stretch should be held for at least 30 seconds. A stretch should never be painful. You should only feel a gentle lengthening or release in the stretched tissue. FLEXION RANGE OF MOTION AND STRETCHING EXERCISES:  STRETCH - Flexion, Single Knee to Chest  Lie on a firm bed or floor with both legs extended in front of  you. Keeping one leg in contact with the floor, bring your opposite knee to your chest. Hold your leg in place by either grabbing behind your thigh or at your knee. Pull until you feel a gentle stretch in your low back. Hold 30 seconds. Slowly release your grasp and repeat the exercise with the opposite side. Repeat 2 times. Complete this exercise 3 times per week.   STRETCH - Flexion, Double Knee to Chest Lie on a firm bed or floor with both legs extended in front of you. Keeping one leg in contact with the floor, bring your opposite knee to your chest. Tense your stomach muscles to support your back and then lift your other knee to your chest. Hold your legs in place by either grabbing behind your thighs or at your knees. Pull both knees toward your chest until you feel a gentle stretch in your low back. Hold 30 seconds. Tense your stomach muscles and slowly return one leg at a time to the floor. Repeat 2 times. Complete this exercise 3 times per week.   STRETCH - Low Trunk Rotation Lie on a firm bed or floor. Keeping your legs in front of you, bend your knees so they are both pointed toward the ceiling and your feet are flat on the floor. Extend your arms out to the side. This will stabilize your upper body by keeping your shoulders in contact with the floor. Gently and slowly drop both knees together to one side until you feel a gentle stretch in your low back. Hold for 30 seconds.  Tense your stomach muscles to support your lower back as you bring your knees back to the starting position. Repeat the exercise to the other side. Repeat 2 times. Complete this exercise at least 3 times per week.   EXTENSION RANGE OF MOTION AND FLEXIBILITY EXERCISES:  STRETCH - Extension, Prone on Elbows  Lie on your stomach on the floor, a bed will be too soft. Place your palms about shoulder width apart and at the height of your head. Place your elbows under your shoulders. If this is too painful, stack  pillows under your chest. Allow your body to relax so that your hips drop lower and make contact more completely with the floor. Hold this position for 30 seconds. Slowly return to lying flat on the floor. Repeat 2 times. Complete this exercise 3 times per week.   RANGE OF MOTION - Extension, Prone Press Ups Lie on your stomach on the floor, a bed will be too soft. Place your palms about shoulder width apart and at the height of your head. Keeping your back as relaxed as possible, slowly straighten your elbows while keeping your hips on the floor. You may adjust the placement of your hands to maximize your comfort. As you gain motion, your hands will come more underneath your shoulders. Hold this position 30 seconds. Slowly return to lying flat on the floor. Repeat 2 times. Complete this exercise 3 times per week.   RANGE OF MOTION- Quadruped, Neutral Spine  Assume a hands and knees position on a firm surface. Keep your hands under your shoulders and your knees under your hips. You may place padding under your knees for comfort. Drop your head and point your tailbone toward the ground below you. This will round out your lower back like an angry cat. Hold this position for 30 seconds. Slowly lift your head and release your tail bone so that your back sags into a large arch, like an old horse. Hold this position for 30 seconds. Repeat this until you feel limber in your low back. Now, find your sweet spot. This will be the most comfortable position somewhere between the two previous positions. This is your neutral spine. Once you have found this position, tense your stomach muscles to support your low back. Hold this position for 30 seconds. Repeat 2 times. Complete this exercise 3 times per week.   STRENGTHENING EXERCISES - Low Back Sprain These exercises may help you when beginning to rehabilitate your injury. These exercises should be done near your sweet spot. This is the neutral,  low-back arch, somewhere between fully rounded and fully arched, that is your least painful position. When performed in this safe range of motion, these exercises can be used for people who have either a flexion or extension based injury. These exercises may resolve your symptoms with or without further involvement from your physician, physical therapist or athletic trainer. While completing these exercises, remember:  Muscles can gain both the endurance and the strength needed for everyday activities through controlled exercises. Complete these exercises as instructed by your physician, physical therapist or athletic trainer. Increase the resistance and repetitions only as guided. You may experience muscle soreness or fatigue, but the pain or discomfort you are trying to eliminate should never worsen during these exercises. If this pain does worsen, stop and make certain you are following the directions exactly. If the pain is still present after adjustments, discontinue the exercise until you can discuss the trouble with your caregiver.  STRENGTHENING -  Deep Abdominals, Pelvic Tilt  Lie on a firm bed or floor. Keeping your legs in front of you, bend your knees so they are both pointed toward the ceiling and your feet are flat on the floor. Tense your lower abdominal muscles to press your low back into the floor. This motion will rotate your pelvis so that your tail bone is scooping upwards rather than pointing at your feet or into the floor. With a gentle tension and even breathing, hold this position for 3 seconds. Repeat 2 times. Complete this exercise 3 times per week.   STRENGTHENING - Abdominals, Crunches  Lie on a firm bed or floor. Keeping your legs in front of you, bend your knees so they are both pointed toward the ceiling and your feet are flat on the floor. Cross your arms over your chest. Slightly tip your chin down without bending your neck. Tense your abdominals and slowly lift your  trunk high enough to just clear your shoulder blades. Lifting higher can put excessive stress on the lower back and does not further strengthen your abdominal muscles. Control your return to the starting position. Repeat 2 times. Complete this exercise 3 times per week.   STRENGTHENING - Quadruped, Opposite UE/LE Lift  Assume a hands and knees position on a firm surface. Keep your hands under your shoulders and your knees under your hips. You may place padding under your knees for comfort. Find your neutral spine and gently tense your abdominal muscles so that you can maintain this position. Your shoulders and hips should form a rectangle that is parallel with the floor and is not twisted. Keeping your trunk steady, lift your right hand no higher than your shoulder and then your left leg no higher than your hip. Make sure you are not holding your breath. Hold this position for 30 seconds. Continuing to keep your abdominal muscles tense and your back steady, slowly return to your starting position. Repeat with the opposite arm and leg. Repeat 2 times. Complete this exercise 3 times per week.   STRENGTHENING - Abdominals and Quadriceps, Straight Leg Raise  Lie on a firm bed or floor with both legs extended in front of you. Keeping one leg in contact with the floor, bend the other knee so that your foot can rest flat on the floor. Find your neutral spine, and tense your abdominal muscles to maintain your spinal position throughout the exercise. Slowly lift your straight leg off the floor about 6 inches for a count of 3, making sure to not hold your breath. Still keeping your neutral spine, slowly lower your leg all the way to the floor. Repeat this exercise with each leg 2 times. Complete this exercise 3 times per week.  POSTURE AND BODY MECHANICS CONSIDERATIONS - Low Back Sprain Keeping correct posture when sitting, standing or completing your activities will reduce the stress put on different body  tissues, allowing injured tissues a chance to heal and limiting painful experiences. The following are general guidelines for improved posture.  While reading these guidelines, remember: The exercises prescribed by your provider will help you have the flexibility and strength to maintain correct postures. The correct posture provides the best environment for your joints to work. All of your joints have less wear and tear when properly supported by a spine with good posture. This means you will experience a healthier, less painful body. Correct posture must be practiced with all of your activities, especially prolonged sitting and standing. Correct posture is  as important when doing repetitive low-stress activities (typing) as it is when doing a single heavy-load activity (lifting).  RESTING POSITIONS Consider which positions are most painful for you when choosing a resting position. If you have pain with flexion-based activities (sitting, bending, stooping, squatting), choose a position that allows you to rest in a less flexed posture. You would want to avoid curling into a fetal position on your side. If your pain worsens with extension-based activities (prolonged standing, working overhead), avoid resting in an extended position such as sleeping on your stomach. Most people will find more comfort when they rest with their spine in a more neutral position, neither too rounded nor too arched. Lying on a non-sagging bed on your side with a pillow between your knees, or on your back with a pillow under your knees will often provide some relief. Keep in mind, being in any one position for a prolonged period of time, no matter how correct your posture, can still lead to stiffness.  PROPER SITTING POSTURE In order to minimize stress and discomfort on your spine, you must sit with correct posture. Sitting with good posture should be effortless for a healthy body. Returning to good posture is a gradual process.  Many people can work toward this most comfortably by using various supports until they have the flexibility and strength to maintain this posture on their own. When sitting with proper posture, your ears will fall over your shoulders and your shoulders will fall over your hips. You should use the back of the chair to support your upper back. Your lower back will be in a neutral position, just slightly arched. You may place a small pillow or folded towel at the base of your lower back for  support.  When working at a desk, create an environment that supports good, upright posture. Without extra support, muscles tire, which leads to excessive strain on joints and other tissues. Keep these recommendations in mind:  CHAIR: A chair should be able to slide under your desk when your back makes contact with the back of the chair. This allows you to work closely. The chair's height should allow your eyes to be level with the upper part of your monitor and your hands to be slightly lower than your elbows.  BODY POSITION Your feet should make contact with the floor. If this is not possible, use a foot rest. Keep your ears over your shoulders. This will reduce stress on your neck and low back.  INCORRECT SITTING POSTURES  If you are feeling tired and unable to assume a healthy sitting posture, do not slouch or slump. This puts excessive strain on your back tissues, causing more damage and pain. Healthier options include: Using more support, like a lumbar pillow. Switching tasks to something that requires you to be upright or walking. Talking a brief walk. Lying down to rest in a neutral-spine position.  PROLONGED STANDING WHILE SLIGHTLY LEANING FORWARD  When completing a task that requires you to lean forward while standing in one place for a long time, place either foot up on a stationary 2-4 inch high object to help maintain the best posture. When both feet are on the ground, the lower back tends to lose  its slight inward curve. If this curve flattens (or becomes too large), then the back and your other joints will experience too much stress, tire more quickly, and can cause pain.  CORRECT STANDING POSTURES Proper standing posture should be assumed with all daily activities,  even if they only take a few moments, like when brushing your teeth. As in sitting, your ears should fall over your shoulders and your shoulders should fall over your hips. You should keep a slight tension in your abdominal muscles to brace your spine. Your tailbone should point down to the ground, not behind your body, resulting in an over-extended swayback posture.   INCORRECT STANDING POSTURES  Common incorrect standing postures include a forward head, locked knees and/or an excessive swayback. WALKING Walk with an upright posture. Your ears, shoulders and hips should all line-up.  PROLONGED ACTIVITY IN A FLEXED POSITION When completing a task that requires you to bend forward at your waist or lean over a low surface, try to find a way to stabilize 3 out of 4 of your limbs. You can place a hand or elbow on your thigh or rest a knee on the surface you are reaching across. This will provide you more stability, so that your muscles do not tire as quickly. By keeping your knees relaxed, or slightly bent, you will also reduce stress across your lower back. CORRECT LIFTING TECHNIQUES  DO : Assume a wide stance. This will provide you more stability and the opportunity to get as close as possible to the object which you are lifting. Tense your abdominals to brace your spine. Bend at the knees and hips. Keeping your back locked in a neutral-spine position, lift using your leg muscles. Lift with your legs, keeping your back straight. Test the weight of unknown objects before attempting to lift them. Try to keep your elbows locked down at your sides in order get the best strength from your shoulders when carrying an object.   Always  ask for help when lifting heavy or awkward objects. INCORRECT LIFTING TECHNIQUES DO NOT:  Lock your knees when lifting, even if it is a small object. Bend and twist. Pivot at your feet or move your feet when needing to change directions. Assume that you can safely pick up even a paperclip without proper posture.

## 2023-10-16 NOTE — Progress Notes (Signed)
 Chief Complaint  Patient presents with   Mass    Left Quad  Lump     Bruce Romero is a 78 y.o. male here for a skin complaint.  Duration: 1 week Location: L lower abd region Pruritic? No Painful? No Drainage? No New soaps/lotions/topicals/detergents? No Trauma? No Other associated symptoms: had been doing more physical labor leading into this Therapies tried thus far: none  Patient has felt a twinge in his low back over the last few weeks.  No injury or change in activity.  He does not routinely stretch.  No bruising, redness, swelling, loss of bowel/bladder control, neurologic signs or symptoms.  Itching in groin region that recurs. Bounces back and forth b/t L and R. Tried a topical steroid which helped but then recurred after he stopped using it. No new topicals. No obvious redness, scaling, fevers. Also tried a triple paste with Desitin, etc with some temporary relief.   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  Hypertonicity noted as well. Neuro: DTRs equal and symmetric in the lower extremities, no clonus, no cerebellar signs, gait is slow/cautious. Abdomen: Bowel sounds present, soft, nontender, slight bulge over the left lower quadrant region.  Does seem to enlarge upon Valsalva. Skin: Slight pinkish patch over the posterior scrotal region. No drainage, erythema, TTP, fluctuance,  excoriation Psych: Age appropriate judgment and insight  Abdominal wall mass - Plan: US  Abdomen Complete  Acute bilateral low back pain without sciatica  Pruritus  Top of differential includes ventral hernia versus cyst.  Will check an ultrasound to further delineate. Heat, ice, stretches and exercises provided.  Consider physical therapy if no improvement. Recommended baby powder to help keep the area clean/dry.  No obvious signs of tinea cruris or other infection. F/u prn. The patient voiced understanding and agreement to the plan.  I spent 30 minutes with the patient discussing the above plans in addition to reviewing his chart on the same day of the visit.  Mabel [place], DO 10/16/23 2:01 PM

## 2023-10-20 ENCOUNTER — Encounter: Payer: Self-pay | Admitting: Family Medicine

## 2023-10-24 ENCOUNTER — Ambulatory Visit (HOSPITAL_BASED_OUTPATIENT_CLINIC_OR_DEPARTMENT_OTHER)
Admission: RE | Admit: 2023-10-24 | Discharge: 2023-10-24 | Disposition: A | Discharge status: Home / Self Care | Source: Ambulatory Visit | Attending: Family Medicine | Admitting: Family Medicine

## 2023-10-24 ENCOUNTER — Ambulatory Visit (HOSPITAL_BASED_OUTPATIENT_CLINIC_OR_DEPARTMENT_OTHER)

## 2023-10-24 DIAGNOSIS — R222 Localized swelling, mass and lump, trunk: Secondary | ICD-10-CM | POA: Diagnosis not present

## 2023-10-24 DIAGNOSIS — K439 Ventral hernia without obstruction or gangrene: Secondary | ICD-10-CM | POA: Diagnosis not present

## 2023-10-24 DIAGNOSIS — R1904 Left lower quadrant abdominal swelling, mass and lump: Secondary | ICD-10-CM | POA: Diagnosis not present

## 2023-10-25 ENCOUNTER — Encounter: Payer: Self-pay | Admitting: Internal Medicine

## 2023-10-25 ENCOUNTER — Ambulatory Visit: Admitting: Internal Medicine

## 2023-10-25 VITALS — BP 124/80 | HR 71 | Ht 69.0 in | Wt 183.0 lb

## 2023-10-25 DIAGNOSIS — Z7984 Long term (current) use of oral hypoglycemic drugs: Secondary | ICD-10-CM

## 2023-10-25 DIAGNOSIS — E119 Type 2 diabetes mellitus without complications: Secondary | ICD-10-CM

## 2023-10-25 LAB — POCT GLUCOSE (DEVICE FOR HOME USE): POC Glucose: 177 mg/dL — AB (ref 70–99)

## 2023-10-25 NOTE — Patient Instructions (Signed)
 Continue Metformin  750 XR twice daily Continue  Mounjaro  7.5 mg weekly    HOW TO TREAT LOW BLOOD SUGARS (Blood sugar LESS THAN 70 MG/DL) Please follow the RULE OF 15 for the treatment of hypoglycemia treatment (when your (blood sugars are less than 70 mg/dL)   STEP 1: Take 15 grams of carbohydrates when your blood sugar is low, which includes:  3-4 GLUCOSE TABS  OR 3-4 OZ OF JUICE OR REGULAR SODA OR ONE TUBE OF GLUCOSE GEL    STEP 2: RECHECK blood sugar in 15 MINUTES STEP 3: If your blood sugar is still low at the 15 minute recheck --> then, go back to STEP 1 and treat AGAIN with another 15 grams of carbohydrates.

## 2023-10-25 NOTE — Progress Notes (Signed)
 Name: Bruce Romero  Age/ Sex: 78 y.o., male   MRN/ DOB: [id number], 08/10/1945     PCP: Antonio Meth, Jamee SAUNDERS, DO   Reason for Endocrinology Evaluation: Type 2 Diabetes Mellitus  Initial Endocrine Consultative Visit: 04/26/2016    PATIENT IDENTIFIER: Bruce Romero is a 78 y.o. male with a past medical history of T2DM, HTN, fibromyalgia. The patient has followed with Endocrinology clinic since 04/26/2016 for consultative assistance with management of his diabetes.  DIABETIC HISTORY:  Mr. Turano was diagnosed with DM 2006, intolerant to Invokana -balanitis, intolerant to Ozempic -GI side effects. His hemoglobin A1c has ranged from 6.2% in 2018, peaking at 7.8% in 2023.   He was followed by Dr. Kassie between 2018 and April 2023   Switch Trulicity  to Mounjaro  10/2021 , no intolerance to Trulicity   He developed intolerance to Mounjaro  7.5 mg dose 12/2022 But the patient requested to go back up to Mounjaro  7.5 mg in September, 2025 based on his PCPs recommendations  SUBJECTIVE:   During the last visit (04/19/2023): A1c 6.9%  Today (10/25/2023): [name] is here for follow-up on diabetes management He checks his blood sugars 2-3 times week. The patient has not had hypoglycemic episodes since the last clinic visit    Patient was prescribed Zofran  through his PCPs office due to nausea from Mounjaro   Continues with chronic constipation - on Linzess  daliy  Patient has been noted weight loss since last visit to our office  Patient was evaluated recently for a left abdominal wall distention, questionable hernia, he had a recent ultrasound He received the first dose of Mounjaro  last Friday, mild nausea noted  HOME DIABETES REGIMEN:  Metformin  750 XR twice daily Mounjaro  7.5 mg weekly     Statin: Yes ACE-I/ARB :yes     METER DOWNLOAD SUMMARY: Did not bring     DIABETIC COMPLICATIONS: Microvascular complications:   Denies: CKD, retinopathy, neuropathy Last Eye  Exam: Completed   Macrovascular complications:   Denies: CAD, CVA, PVD   HISTORY:  Past Medical History:  Past Medical History:  Diagnosis Date   Acute medial meniscal tear, left, initial encounter    Anxiety    Diabetes mellitus    GERD (gastroesophageal reflux disease)    Hyperlipidemia    Hypertension    Irritable bowel    Spastic colon    anaphylaxis due to Keflex 1988   Past Surgical History:  Past Surgical History:  Procedure Laterality Date   CHOLECYSTECTOMY     CHONDROPLASTY Left 01/15/2016   Procedure: CHONDROPLASTY;  Surgeon: Maude Herald, MD;  Location: [facility];  Service: Orthopedics;  Laterality: Left;   KNEE ARTHROSCOPY WITH MEDIAL MENISECTOMY Left 01/15/2016   Procedure: ARTHROSCOPY LEFT KNEE WITH PARTIAL MEDIAL MENISECTOMY;  Surgeon: Maude Herald, MD;  Location: [facility];  Service: Orthopedics;  Laterality: Left;   NASAL SINUS SURGERY     Social History:  reports that he has quit smoking. He has never used smokeless tobacco. He reports current alcohol use. He reports that he does not use drugs. Family History:  Family History  Problem Relation Age of Onset   Stroke Mother    Pulmonary embolism Father    Diabetes Brother      HOME MEDICATIONS: Allergies as of 10/25/2023       Reactions   Cephalexin Anaphylaxis   REACTION: pt is not allergic to penicillin   Bactrim [sulfamethoxazole -trimethoprim] Other (See Comments)   ? Took Bactrim and Flexeril  at same time had lip  swelling unsure which caused reaction.   Bromocriptine  Nausea And Vomiting   Flexeril  [cyclobenzaprine ] Swelling   Invokana  [canagliflozin ]    balanitis        Medication List        Accurate as of October 25, 2023 11:13 AM. If you have any questions, ask your nurse or doctor.          albuterol  108 (90 Base) MCG/ACT inhaler Commonly known as: VENTOLIN  HFA Inhale 2 puffs into the lungs every 6 (six) hours as needed for wheezing or  shortness of breath.   ALPRAZolam  0.25 MG tablet Commonly known as: XANAX  TAKE 1 TABLET BY MOUTH THREE TIMES DAILY AS NEEDED   amLODipine  10 MG tablet Commonly known as: NORVASC  Take 1 tablet (10 mg total) by mouth daily.   atorvastatin  10 MG tablet Commonly known as: LIPITOR Take 1 tablet (10 mg total) by mouth daily.   azelastine  0.1 % nasal spray Commonly known as: ASTELIN  PLACE 1 SPRAY IN EACH NOSTRIL TWICE DAILY   candesartan  16 MG tablet Commonly known as: ATACAND  TAKE 1 TABLET(16 MG) BY MOUTH AT BEDTIME   celecoxib  200 MG capsule Commonly known as: CeleBREX  Take 1 capsule (200 mg total) by mouth 2 (two) times daily.   chlorpheniramine-HYDROcodone  10-8 MG/5ML Commonly known as: TUSSIONEX Take 5 mLs by mouth at bedtime as needed for cough.   co-enzyme Q-10 30 MG capsule Take 100 mg by mouth daily.   erythromycin ophthalmic ointment Place 1 Application into both eyes as needed.   esomeprazole  40 MG capsule Commonly known as: NEXIUM  TAKE 1 CAPSULE(40 MG) BY MOUTH DAILY   Fluad 0.5 ML injection Generic drug: influenza vaccine adjuvanted   fluticasone  50 MCG/ACT nasal spray Commonly known as: FLONASE  Place into both nostrils daily.   Insulin  Pen Needle 32G X 6 MM Misc Use as directed with insulin  pen   linaclotide  290 MCG Caps capsule Commonly known as: Linzess  Take 1 capsule (290 mcg total) by mouth daily before breakfast.   meloxicam  7.5 MG tablet Commonly known as: MOBIC  Take 1 tablet (7.5 mg total) by mouth daily.   metFORMIN  750 MG 24 hr tablet Commonly known as: GLUCOPHAGE -XR Take 1 tablet (750 mg total) by mouth 2 (two) times daily.   montelukast  10 MG tablet Commonly known as: SINGULAIR  TAKE 1 TABLET(10 MG) BY MOUTH AT BEDTIME   niacin  500 MG tablet Commonly known as: (VITAMIN B3) Take 500 mg by mouth at bedtime.   ofloxacin  0.3 % OTIC solution Commonly known as: Floxin  Otic Place 10 drops into the right ear daily.   ondansetron  4 MG  disintegrating tablet Commonly known as: ZOFRAN -ODT Take 1 tablet (4 mg total) by mouth every 8 (eight) hours as needed for nausea or vomiting.   OneTouch Verio test strip Generic drug: glucose blood TEST ONCE DAILY   tamsulosin  0.4 MG Caps capsule Commonly known as: FLOMAX  TAKE 1 CAPSULE(0.4 MG) BY MOUTH DAILY   tirzepatide  7.5 MG/0.5ML Pen Commonly known as: MOUNJARO  Inject 7.5 mg into the skin once a week.   Vitamin D  50 MCG (2000 UT) tablet Take 2,000 Units by mouth daily.         OBJECTIVE:   Vital Signs: BP 124/80 (BP Location: Left Arm, Patient Position: Sitting, Cuff Size: Normal)   Pulse 71   Ht 5' 9 (1.753 m)   Wt 183 lb (83 kg)   SpO2 99%   BMI 27.02 kg/m   Wt Readings from Last 3 Encounters:  10/25/23 183 lb (  83 kg)  10/16/23 184 lb (83.5 kg)  09/22/23 187 lb 3.2 oz (84.9 kg)     Exam: General: Pt appears well and is in NAD  Lungs: Clear with good BS bilat   Heart: RRR   Extremities: No pretibial edema.   Neuro: MS is good with appropriate affect, pt is alert and Ox3    DM foot exam: 10/25/2023  The skin of the feet is intact without sores or ulcerations. The pedal pulses are 2+ on right and 2+ on left. The sensation is intact to a screening 5.07, 10 gram monofilament bilaterally        DATA REVIEWED:  Lab Results  Component Value Date   HGBA1C 7.2 (H) 09/22/2023   HGBA1C 6.9 (A) 04/19/2023   HGBA1C 6.2 09/29/2022    Latest Reference Range & Units 09/22/23 14:46  Sodium 135 - 146 mmol/L 135  Potassium 3.5 - 5.3 mmol/L 4.3  Chloride 98 - 110 mmol/L 99  CO2 20 - 32 mmol/L 26  Glucose 65 - 99 mg/dL 888 (H)  Mean Plasma Glucose mg/dL 839  BUN 7 - 25 mg/dL 12  Creatinine 9.29 - 8.71 mg/dL 9.13  Calcium  8.6 - 10.3 mg/dL 9.3  BUN/Creatinine Ratio 6 - 22 (calc) SEE NOTE:  eGFR > OR = 60 mL/min/1.46m2 88  AG Ratio 1.0 - 2.5 (calc) 1.8  AST 10 - 35 U/L 20  ALT 9 - 46 U/L 17  Total Protein 6.1 - 8.1 g/dL 6.6  Total Bilirubin 0.2 - 1.2  mg/dL 0.6    Latest Reference Range & Units 09/22/23 14:46  Total CHOL/HDL Ratio <5.0 (calc) 1.9  Cholesterol <200 mg/dL 99  HDL Cholesterol > OR = 40 mg/dL 53  LDL Cholesterol (Calc) mg/dL (calc) 27  MICROALB/CREAT RATIO <30 mg/g creat 4  Non-HDL Cholesterol (Calc) <130 mg/dL (calc) 46  Triglycerides <150 mg/dL 890    Latest Reference Range & Units 09/22/23 14:46  Microalb, Ur mg/dL 0.2  MICROALB/CREAT RATIO <30 mg/g creat 4  Creatinine, Urine 20 - 320 mg/dL 54      Old records , labs and images have been reviewed.   In office Bg 177 mg/dL   ASSESSMENT / PLAN / RECOMMENDATIONS:   1) Type 2 Diabetes Mellitus, Optimally controlled, Without complications - Most recent A1c of 7.2 %. Goal A1c < 7.0 %.     -A1c slightly elevated - Intolerant to Ozempic  2 mg weekly  - He did have intolerance to Mounjaro  7.5 mg back in November, 2024.  Patient opted to give this another try, he just received his first injection approximately 5 days ago with only minimal nausea   MEDICATIONS:  Continue metformin  750 mg XR, twice daily Continue Mounjaro  7.5 mg weekly  EDUCATION / INSTRUCTIONS: BG monitoring instructions: Patient is instructed to check his blood sugars 1 times a day, fasting . Call [facility] if: BG persistently < 70  I reviewed the Rule of 15 for the treatment of hypoglycemia in detail with the patient. Literature supplied.    2) Diabetic complications:  Eye: Does not have known diabetic retinopathy.  Neuro/ Feet: Does not have known diabetic peripheral neuropathy .  Renal: Patient does not have known baseline CKD. He   is  on an ACEI/ARB at present.    F/U in 4 months     Signed electronically by: Stefano Redgie Butts, MD  [facility]  [facility] [address] [place], [place] [address] Phone: [phone] FAX:  [phone]   CC: Antonio Cyndee Jamee JONELLE, DO [address] [place]  [address] Phone: [phone]  Fax: [phone]  Return to Endocrinology clinic as below: No future appointments.   He questions

## 2023-10-29 ENCOUNTER — Ambulatory Visit: Payer: Self-pay | Admitting: Family Medicine

## 2023-10-31 DIAGNOSIS — R350 Frequency of micturition: Secondary | ICD-10-CM | POA: Diagnosis not present

## 2023-11-03 NOTE — Telephone Encounter (Signed)
 Copied from CRM [id number]. Topic: Referral - Request for Referral >> Nov 03, 2023 11:26 AM Bruce Romero wrote: Did the patient discuss referral with their provider in the last year? No  Mychart message sent to request referral, no response yet, but provider aware of hernia.   Type of order/referral and detailed reason for visit:  Per mychart message, Dr. Frann, I believe it would be in my best interest to consult with a surgeon and get an opinion on my ventral hernia .Could you send a referral to [facility] at [phone]. My first choice of a surgeon is Bruce Romero. My second choice is Bruce Romero. Bruce Romero. Thank you, Bruce Romero  Preference of office, provider, location: [facility] at [phone] Bruce Romero  If referral order, have you been seen by this specialty before? No (If Yes, this issue or another issue? When? Where?  Can we respond through MyChart? Yes

## 2023-11-04 ENCOUNTER — Other Ambulatory Visit: Payer: Self-pay | Admitting: Family Medicine

## 2023-11-04 DIAGNOSIS — K589 Irritable bowel syndrome without diarrhea: Secondary | ICD-10-CM

## 2023-11-07 ENCOUNTER — Other Ambulatory Visit: Payer: Self-pay

## 2023-11-07 ENCOUNTER — Telehealth: Payer: Self-pay

## 2023-11-07 DIAGNOSIS — Z8719 Personal history of other diseases of the digestive system: Secondary | ICD-10-CM

## 2023-11-07 NOTE — Telephone Encounter (Signed)
 Copied from CRM #[id number]. Topic: Referral - Question >> Nov 07, 2023  2:57 PM Anairis L wrote: Reason for CRM: Patient is following up on referral that was suppose to be created on 10/31/2023.Please fax over referral as soon as possible.   Thank you.   Preference of office, provider, location: [facility] at [phone] Donnice POUR. Tsuei MD

## 2023-11-07 NOTE — Telephone Encounter (Signed)
 Referral placed and message sent to pt.

## 2023-11-07 NOTE — Telephone Encounter (Signed)
**Note De-identified  [place] Obfuscation** Please advise 

## 2023-11-07 NOTE — Telephone Encounter (Signed)
OK to refer. Thx.

## 2023-11-09 ENCOUNTER — Encounter: Payer: Self-pay | Admitting: Family Medicine

## 2023-11-20 ENCOUNTER — Other Ambulatory Visit: Payer: Self-pay | Admitting: Surgery

## 2023-11-20 ENCOUNTER — Ambulatory Visit: Payer: Self-pay | Admitting: Surgery

## 2023-11-20 DIAGNOSIS — R1904 Left lower quadrant abdominal swelling, mass and lump: Secondary | ICD-10-CM

## 2023-11-20 DIAGNOSIS — K439 Ventral hernia without obstruction or gangrene: Secondary | ICD-10-CM | POA: Diagnosis not present

## 2023-11-27 ENCOUNTER — Other Ambulatory Visit: Payer: Self-pay | Admitting: Family Medicine

## 2023-11-27 DIAGNOSIS — I10 Essential (primary) hypertension: Secondary | ICD-10-CM

## 2023-11-28 ENCOUNTER — Inpatient Hospital Stay
Admission: RE | Admit: 2023-11-28 | Discharge: 2023-11-28 | Disposition: A | Source: Ambulatory Visit | Attending: Surgery | Admitting: Surgery

## 2023-11-28 DIAGNOSIS — R1904 Left lower quadrant abdominal swelling, mass and lump: Secondary | ICD-10-CM

## 2023-11-28 DIAGNOSIS — K5730 Diverticulosis of large intestine without perforation or abscess without bleeding: Secondary | ICD-10-CM | POA: Diagnosis not present

## 2023-11-28 MED ORDER — IOPAMIDOL (ISOVUE-300) INJECTION 61%
100.0000 mL | Freq: Once | INTRAVENOUS | Status: AC | PRN
Start: 2023-11-28 — End: 2023-11-28
  Administered 2023-11-28: 100 mL via INTRAVENOUS

## 2023-11-30 ENCOUNTER — Encounter: Payer: Self-pay | Admitting: Internal Medicine

## 2023-11-30 ENCOUNTER — Ambulatory Visit: Payer: Self-pay | Admitting: Surgery

## 2023-11-30 DIAGNOSIS — E119 Type 2 diabetes mellitus without complications: Secondary | ICD-10-CM

## 2023-11-30 NOTE — Progress Notes (Signed)
 I talked to the patient and discussed his CT scan.  I recommended left inguinal hernia repair with mesh.  We discussed other techniques such as laparoscopic or robotic repair with mesh.  He is going to think about and will call to let us  know if he wants to proceed with scheduling surgery, or if he wants to come in for a visit.  If he wants laparoscopic/ robotic surgery, send him to Public house manager.

## 2023-12-01 MED ORDER — ACCU-CHEK GUIDE TEST VI STRP: ORAL_STRIP | 12 refills | Status: AC

## 2023-12-01 MED ORDER — ACCU-CHEK GUIDE W/DEVICE KIT: PACK | 0 refills | Status: AC

## 2023-12-12 ENCOUNTER — Encounter: Payer: Self-pay | Admitting: Family Medicine

## 2023-12-12 ENCOUNTER — Ambulatory Visit (INDEPENDENT_AMBULATORY_CARE_PROVIDER_SITE_OTHER): Admitting: Family Medicine

## 2023-12-12 VITALS — BP 132/78 | HR 76 | Temp 98.1°F | Resp 18 | Ht 69.0 in | Wt 177.0 lb

## 2023-12-12 DIAGNOSIS — E119 Type 2 diabetes mellitus without complications: Secondary | ICD-10-CM

## 2023-12-12 DIAGNOSIS — Z7984 Long term (current) use of oral hypoglycemic drugs: Secondary | ICD-10-CM

## 2023-12-12 DIAGNOSIS — E785 Hyperlipidemia, unspecified: Secondary | ICD-10-CM | POA: Diagnosis not present

## 2023-12-12 DIAGNOSIS — F339 Major depressive disorder, recurrent, unspecified: Secondary | ICD-10-CM

## 2023-12-12 DIAGNOSIS — E559 Vitamin D deficiency, unspecified: Secondary | ICD-10-CM

## 2023-12-12 DIAGNOSIS — E1169 Type 2 diabetes mellitus with other specified complication: Secondary | ICD-10-CM

## 2023-12-12 DIAGNOSIS — I10 Essential (primary) hypertension: Secondary | ICD-10-CM

## 2023-12-12 MED ORDER — SERTRALINE HCL 50 MG PO TABS: 50.0000 mg | ORAL_TABLET | Freq: Every day | ORAL | 3 refills | Status: AC

## 2023-12-12 NOTE — Progress Notes (Signed)
 Subjective:    Patient ID: Bruce Romero, male    DOB: [birth year], 78 y.o.   MRN: [id number]  Chief Complaint  Patient presents with   Discuss Imaging/ Labs   Depression    HPI Patient is in today for f/u imaging.  Discussed the use of AI scribe software for clinical note transcription with the patient, who gave verbal consent to proceed.  History of Present Illness Bruce Romero is a 78 year old male who presents for evaluation of a ventral and inguinal hernia.  He has undergone an abdominal ultrasound and a CT scan, both confirming the presence of a hernia. The ultrasound indicated a ventral hernia, while the CT scan identified an inguinal hernia located in the lower quadrant above the inguinal canal. He experiences occasional pain, gas, and bloating due to the hernia, although he is not currently in pain. He is seeking a second opinion regarding surgical options, particularly interested in laparoscopic surgery due to previous open abdominal surgery and concerns about recovery time.  He has a history of atherosclerosis, confirmed in 2017. He is not currently under the care of a cardiologist.  He experiences stress and anxiety, which he attributes to personal circumstances. He is interested in starting a low-dose antidepressant to manage these symptoms and mentions having been on an antidepressant previously for a short period. He also expresses interest in resuming Celebrex  for arthritis management, noting previous use of 100 mg and 200 mg doses. He has confirmed cervical thoracic arthritis and SI joint involvement through orthopedic evaluation and imaging.  He continues to take Linzess  for bowel management, noting that it is effective when taken as directed, although it sometimes causes watery stools. He has tried lower doses in the past but found them ineffective.  His social history includes efforts to improve his diet and increase physical activity. He has  started walking and is focusing on a balanced diet with protein and fiber, reducing carbohydrate intake. He reports no swelling in his legs and no numbness in his feet.    Past Medical History:  Diagnosis Date   Acute medial meniscal tear, left, initial encounter    Anxiety    Diabetes mellitus    GERD (gastroesophageal reflux disease)    Hyperlipidemia    Hypertension    Irritable bowel    Spastic colon    anaphylaxis due to Keflex 1988    Past Surgical History:  Procedure Laterality Date   CHOLECYSTECTOMY     CHONDROPLASTY Left 01/15/2016   Procedure: CHONDROPLASTY;  Surgeon: Maude Herald, MD;  Location: [facility];  Service: Orthopedics;  Laterality: Left;   KNEE ARTHROSCOPY WITH MEDIAL MENISECTOMY Left 01/15/2016   Procedure: ARTHROSCOPY LEFT KNEE WITH PARTIAL MEDIAL MENISECTOMY;  Surgeon: Maude Herald, MD;  Location: [facility];  Service: Orthopedics;  Laterality: Left;   NASAL SINUS SURGERY      Family History  Problem Relation Age of Onset   Stroke Mother    Pulmonary embolism Father    Diabetes Brother     Social History   Socioeconomic History   Marital status: Married    Spouse name: Not on file   Number of children: Not on file   Years of education: Not on file   Highest education level: Bachelor's degree (e.g., BA, AB, BS)  Occupational History   Occupation: retired  Tobacco Use   Smoking status: Former   Smokeless tobacco: Never  Substance and Sexual  Activity   Alcohol use: Yes    Alcohol/week: 0.0 standard drinks of alcohol    Comment: social   Drug use: No   Sexual activity: Yes  Other Topics Concern   Not on file  Social History Narrative   Exercise try to everyday.   Social Drivers of Corporate Investment Banker Strain: Low Risk  (10/15/2023)   Overall Financial Resource Strain (CARDIA)    Difficulty of Paying Living Expenses: Not very hard  Food Insecurity: No Food Insecurity (10/15/2023)   Hunger Vital Sign     Worried About Running Out of Food in the Last Year: Never true    Ran Out of Food in the Last Year: Never true  Transportation Needs: No Transportation Needs (10/15/2023)   PRAPARE - Administrator, Civil Service (Medical): No    Lack of Transportation (Non-Medical): No  Physical Activity: Insufficiently Active (10/15/2023)   Exercise Vital Sign    Days of Exercise per Week: 2 days    Minutes of Exercise per Session: 10 min  Stress: Stress Concern Present (10/15/2023)   [employer] of Occupational Health - Occupational Stress Questionnaire    Feeling of Stress: Rather much  Social Connections: Moderately Isolated (10/15/2023)   Social Connection and Isolation Panel    Frequency of Communication with Friends and Family: Three times a week    Frequency of Social Gatherings with Friends and Family: Three times a week    Attends Religious Services: Patient declined    Active Member of Clubs or Organizations: No    Attends Engineer, Structural: Not on file    Marital Status: Married  Catering Manager Violence: Not on file    Outpatient Medications Prior to Visit  Medication Sig Dispense Refill   albuterol  (VENTOLIN  HFA) 108 (90 Base) MCG/ACT inhaler Inhale 2 puffs into the lungs every 6 (six) hours as needed for wheezing or shortness of breath. 8 g 0   ALPRAZolam  (XANAX ) 0.25 MG tablet TAKE 1 TABLET BY MOUTH THREE TIMES DAILY AS NEEDED 90 tablet 1   amLODipine  (NORVASC ) 10 MG tablet Take 1 tablet (10 mg total) by mouth daily. 90 tablet 0   atorvastatin  (LIPITOR) 10 MG tablet Take 1 tablet (10 mg total) by mouth daily. 100 tablet 1   azelastine  (ASTELIN ) 0.1 % nasal spray PLACE 1 SPRAY IN EACH NOSTRIL TWICE DAILY 30 mL 2   Blood Glucose Monitoring Suppl (ACCU-CHEK GUIDE) w/Device KIT Use as advised 1 kit 0   candesartan  (ATACAND ) 16 MG tablet TAKE 1 TABLET(16 MG) BY MOUTH AT BEDTIME 90 tablet 1   chlorpheniramine-HYDROcodone  (TUSSIONEX) 10-8 MG/5ML Take 5 mLs by mouth  at bedtime as needed for cough. 115 mL 0   Cholecalciferol (VITAMIN D ) 2000 UNITS tablet Take 2,000 Units by mouth daily.     co-enzyme Q-10 30 MG capsule Take 100 mg by mouth daily.     erythromycin ophthalmic ointment Place 1 Application into both eyes as needed.     esomeprazole  (NEXIUM ) 40 MG capsule TAKE 1 CAPSULE(40 MG) BY MOUTH DAILY 90 capsule 1   fluticasone  (FLONASE ) 50 MCG/ACT nasal spray Place into both nostrils daily.     glucose blood (ACCU-CHEK GUIDE TEST) test strip Use as instructed 100 each 12   Insulin  Pen Needle 32G X 6 MM MISC Use as directed with insulin  pen 30 each 3   LINZESS  290 MCG CAPS capsule TAKE 1 CAPSULE(290 MCG) BY MOUTH DAILY BEFORE BREAKFAST 90 capsule 3  metFORMIN  (GLUCOPHAGE -XR) 750 MG 24 hr tablet Take 1 tablet (750 mg total) by mouth 2 (two) times daily. 180 tablet 3   montelukast  (SINGULAIR ) 10 MG tablet TAKE 1 TABLET(10 MG) BY MOUTH AT BEDTIME 90 tablet 0   niacin  (SLO-NIACIN ) 500 MG tablet Take 500 mg by mouth at bedtime.     ofloxacin  (FLOXIN  OTIC) 0.3 % OTIC solution Place 10 drops into the right ear daily. 5 mL 0   ondansetron  (ZOFRAN -ODT) 4 MG disintegrating tablet Take 1 tablet (4 mg total) by mouth every 8 (eight) hours as needed for nausea or vomiting. 20 tablet 0   tamsulosin  (FLOMAX ) 0.4 MG CAPS capsule TAKE 1 CAPSULE(0.4 MG) BY MOUTH DAILY 90 capsule 3   tirzepatide  (MOUNJARO ) 7.5 MG/0.5ML Pen Inject 7.5 mg into the skin once a week. 6 mL 2   meloxicam  (MOBIC ) 7.5 MG tablet Take 1 tablet (7.5 mg total) by mouth daily. 30 tablet 0   celecoxib  (CELEBREX ) 200 MG capsule Take 1 capsule (200 mg total) by mouth 2 (two) times daily. (Patient not taking: Reported on 12/12/2023) 90 capsule 3   FLUAD 0.5 ML injection  (Patient not taking: Reported on 12/12/2023)     No facility-administered medications prior to visit.    Allergies  Allergen Reactions   Cephalexin Anaphylaxis    REACTION: pt is not allergic to penicillin   Bactrim  [Sulfamethoxazole -Trimethoprim] Other (See Comments)    ? Took Bactrim and Flexeril  at same time had lip swelling unsure which caused reaction.   Bromocriptine  Nausea And Vomiting   Flexeril  [Cyclobenzaprine ] Swelling   Invokana  [Canagliflozin ]     balanitis    Review of Systems  Constitutional:  Negative for fever and malaise/fatigue.  HENT:  Negative for congestion.   Eyes:  Negative for blurred vision.  Respiratory:  Negative for cough and shortness of breath.   Cardiovascular:  Negative for chest pain, palpitations and leg swelling.  Gastrointestinal:  Negative for vomiting.  Musculoskeletal:  Negative for back pain.  Skin:  Negative for rash.  Neurological:  Negative for loss of consciousness and headaches.       Objective:    Physical Exam Vitals and nursing note reviewed.  Constitutional:      General: He is not in acute distress.    Appearance: Normal appearance. He is well-developed.  HENT:     Head: Normocephalic and atraumatic.  Eyes:     General: No scleral icterus.       Right eye: No discharge.        Left eye: No discharge.  Cardiovascular:     Rate and Rhythm: Normal rate and regular rhythm.     Heart sounds: No murmur heard. Pulmonary:     Effort: Pulmonary effort is normal. No respiratory distress.     Breath sounds: Normal breath sounds.  Musculoskeletal:        General: Normal range of motion.     Cervical back: Normal range of motion and neck supple.     Right lower leg: No edema.     Left lower leg: No edema.  Skin:    General: Skin is warm and dry.  Neurological:     Mental Status: He is alert and oriented to person, place, and time.  Psychiatric:        Mood and Affect: Mood normal.        Behavior: Behavior normal.        Thought Content: Thought content normal.  Judgment: Judgment normal.     BP 132/78 (BP Location: Left Arm, Patient Position: Sitting, Cuff Size: Normal)   Pulse 76   Temp 98.1 F (36.7 C) (Oral)   Resp 18    Ht 5' 9 (1.753 m)   Wt 177 lb (80.3 kg)   SpO2 97%   BMI 26.14 kg/m  Wt Readings from Last 3 Encounters:  12/12/23 177 lb (80.3 kg)  10/25/23 183 lb (83 kg)  10/16/23 184 lb (83.5 kg)    Diabetic Foot Exam - Simple   No data filed    Lab Results  Component Value Date   WBC 8.3 09/22/2023   HGB 12.2 (L) 09/22/2023   HCT 37.5 (L) 09/22/2023   PLT 202 09/22/2023   GLUCOSE 111 (H) 09/22/2023   CHOL 99 09/22/2023   TRIG 109 09/22/2023   HDL 53 09/22/2023   LDLDIRECT 74.0 03/30/2018   LDLCALC 27 09/22/2023   ALT 17 09/22/2023   AST 20 09/22/2023   NA 135 09/22/2023   K 4.3 09/22/2023   CL 99 09/22/2023   CREATININE 0.86 09/22/2023   BUN 12 09/22/2023   CO2 26 09/22/2023   TSH 1.43 09/22/2023   PSA 0.40 09/22/2023   HGBA1C 7.2 (H) 09/22/2023   MICROALBUR 0.2 09/22/2023    Lab Results  Component Value Date   TSH 1.43 09/22/2023   Lab Results  Component Value Date   WBC 8.3 09/22/2023   HGB 12.2 (L) 09/22/2023   HCT 37.5 (L) 09/22/2023   MCV 86.0 09/22/2023   PLT 202 09/22/2023   Lab Results  Component Value Date   NA 135 09/22/2023   K 4.3 09/22/2023   CO2 26 09/22/2023   GLUCOSE 111 (H) 09/22/2023   BUN 12 09/22/2023   CREATININE 0.86 09/22/2023   BILITOT 0.6 09/22/2023   ALKPHOS 45 09/29/2022   AST 20 09/22/2023   ALT 17 09/22/2023   PROT 6.6 09/22/2023   ALBUMIN 4.3 09/29/2022   CALCIUM  9.3 09/22/2023   EGFR 88 09/22/2023   GFR 82.00 09/29/2022   Lab Results  Component Value Date   CHOL 99 09/22/2023   Lab Results  Component Value Date   HDL 53 09/22/2023   Lab Results  Component Value Date   LDLCALC 27 09/22/2023   Lab Results  Component Value Date   TRIG 109 09/22/2023   Lab Results  Component Value Date   CHOLHDL 1.9 09/22/2023   Lab Results  Component Value Date   HGBA1C 7.2 (H) 09/22/2023       Assessment & Plan:  Depression, recurrent -     Sertraline HCl; Take 1 tablet (50 mg total) by mouth daily.  Dispense: 90  tablet; Refill: 3 -     TSH  Hypertension, unspecified type -     CBC with Differential/Platelet  Type 2 diabetes mellitus without complication, without long-term current use of insulin  (HCC) -     Comprehensive metabolic panel with GFR  Hyperlipidemia associated with type 2 diabetes mellitus (HCC) -     Comprehensive metabolic panel with GFR -     Lipid panel  Vitamin D  deficiency -     VITAMIN D  25 Hydroxy (Vit-D Deficiency, Fractures)   Assessment and Plan Assessment & Plan Ventral and inguinal hernia   Abdominal ultrasound and CT scan confirm the presence of a ventral and inguinal hernia with occasional pain and bloating. The sigmoid colon is partially within the hernia. Surgical repair is under consideration to prevent emergencies,  with a preference for a laparoscopic approach due to better recovery time. A second opinion from Dr. Rubin on surgical options is pending. He consulted Dr. Rubin on November 12th and will schedule preoperative clearance, including EKG and basic tests. The decision on whether to proceed with open or laparoscopic surgery will follow the consultation.  Depression and anxiety   He reports stress and anxiety, possibly linked to Susan's health issues. A PHQ-9 score of 4 indicates mild depression, and a GAD-7 score of 8 indicates mild anxiety. He is interested in starting a low-dose antidepressant to manage symptoms and potentially reduce IBS flare-ups. Sertraline (Zoloft) has been started once daily. A follow-up in one month, either virtually or in person, will assess effectiveness and allow for dosage adjustments if necessary.  Osteoarthritis of cervical, thoracic spine, and sacroiliac joints   Orthopedics and imaging confirm osteoarthritis. He has concerns about Celebrex  due to its black box warning but is interested in trying it again for pain management, as the previous trial was too short to assess efficacy. Celebrex  100 mg twice daily is prescribed. His  response to Celebrex  will be monitored, with dosage adjustments as needed.  Chronic idiopathic constipation   Managed with Linzess , which is effective but occasionally causes overly loose stools. He is aware of the risk of constipation if the medication is stopped. Linzess  will be continued as currently prescribed.   Celene Pippins R Lowne Chase, DO

## 2023-12-13 LAB — COMPREHENSIVE METABOLIC PANEL WITH GFR
ALT: 19 U/L (ref 0–53)
AST: 23 U/L (ref 0–37)
Albumin: 4.4 g/dL (ref 3.5–5.2)
Alkaline Phosphatase: 40 U/L (ref 39–117)
BUN: 22 mg/dL (ref 6–23)
CO2: 26 meq/L (ref 19–32)
Calcium: 9.1 mg/dL (ref 8.4–10.5)
Chloride: 98 meq/L (ref 96–112)
Creatinine, Ser: 1.01 mg/dL (ref 0.40–1.50)
GFR: 70.81 mL/min (ref >60.00)
Glucose, Bld: 114 mg/dL — ABNORMAL HIGH (ref 70–99)
Potassium: 4.4 meq/L (ref 3.5–5.1)
Sodium: 135 meq/L (ref 135–145)
Total Bilirubin: 0.6 mg/dL (ref 0.2–1.2)
Total Protein: 6.7 g/dL (ref 6.0–8.3)

## 2023-12-13 LAB — LIPID PANEL
Cholesterol: 103 mg/dL (ref 0–200)
HDL: 48.4 mg/dL (ref >39.00)
LDL Cholesterol: 28 mg/dL (ref 0–99)
NonHDL: 54.84
Total CHOL/HDL Ratio: 2
Triglycerides: 134 mg/dL (ref 0.0–149.0)
VLDL: 26.8 mg/dL (ref 0.0–40.0)

## 2023-12-13 LAB — CBC WITH DIFFERENTIAL/PLATELET
Basophils Absolute: 0 K/uL (ref 0.0–0.1)
Basophils Relative: 0.7 % (ref 0.0–3.0)
Eosinophils Absolute: 0.2 K/uL (ref 0.0–0.7)
Eosinophils Relative: 2.6 % (ref 0.0–5.0)
HCT: 36.8 % — ABNORMAL LOW (ref 39.0–52.0)
Hemoglobin: 12.5 g/dL — ABNORMAL LOW (ref 13.0–17.0)
Lymphocytes Relative: 34.1 % (ref 12.0–46.0)
Lymphs Abs: 2.2 K/uL (ref 0.7–4.0)
MCHC: 34 g/dL (ref 30.0–36.0)
MCV: 83.2 fl (ref 78.0–100.0)
Monocytes Absolute: 0.5 K/uL (ref 0.1–1.0)
Monocytes Relative: 7.9 % (ref 3.0–12.0)
Neutro Abs: 3.5 K/uL (ref 1.4–7.7)
Neutrophils Relative %: 54.7 % (ref 43.0–77.0)
Platelets: 184 K/uL (ref 150.0–400.0)
RBC: 4.42 Mil/uL (ref 4.22–5.81)
RDW: 15.1 % (ref 11.5–15.5)
WBC: 6.3 K/uL (ref 4.0–10.5)

## 2023-12-13 NOTE — Telephone Encounter (Signed)
Please advise. Med not on list.

## 2023-12-14 ENCOUNTER — Other Ambulatory Visit: Payer: Self-pay | Admitting: Family Medicine

## 2023-12-14 LAB — TSH: TSH: 1.31 u[IU]/mL (ref 0.35–5.50)

## 2023-12-14 LAB — VITAMIN D 25 HYDROXY (VIT D DEFICIENCY, FRACTURES): VITD: 67.82 ng/mL (ref 30.00–100.00)

## 2023-12-14 MED ORDER — CELECOXIB 100 MG PO CAPS: 100.0000 mg | ORAL_CAPSULE | Freq: Two times a day (BID) | ORAL | 1 refills | Status: AC

## 2023-12-19 ENCOUNTER — Ambulatory Visit: Payer: Self-pay | Admitting: Family

## 2023-12-22 ENCOUNTER — Encounter: Payer: Self-pay | Admitting: Internal Medicine

## 2023-12-22 ENCOUNTER — Encounter: Payer: Self-pay | Admitting: Family Medicine

## 2023-12-25 MED ORDER — INSULIN LISPRO (1 UNIT DIAL) 100 UNIT/ML (KWIKPEN): PEN_INJECTOR | SUBCUTANEOUS | 3 refills | Status: AC

## 2023-12-25 MED ORDER — INSULIN PEN NEEDLE 32G X 4 MM MISC: 1.0000 | Freq: Three times a day (TID) | 3 refills | Status: AC

## 2023-12-26 ENCOUNTER — Other Ambulatory Visit: Payer: Self-pay | Admitting: Family Medicine

## 2023-12-26 ENCOUNTER — Ambulatory Visit: Payer: Self-pay | Admitting: General Surgery

## 2023-12-26 DIAGNOSIS — J302 Other seasonal allergic rhinitis: Secondary | ICD-10-CM

## 2023-12-26 NOTE — Progress Notes (Signed)
 Surgical Instructions   Your procedure is scheduled on Tuesday, November 25th, 2025. Report to [facility] Entrance A at 12:15 P.M., then check in with the Admitting office. Any questions or running late day of surgery: call [phone]  Questions prior to your surgery date: call [phone], Monday-Friday, 8am-4pm. If you experience any cold or flu symptoms such as cough, fever, chills, shortness of breath, etc. between now and your scheduled surgery, please notify us  at the above number.     Remember:  Do not eat after midnight the night before your surgery   You may drink clear liquids until 11:15 the morning of your surgery.   Clear liquids allowed are: Water, Non-Citrus Juices (without pulp), Carbonated Beverages, Clear Tea (no milk, honey, etc.), Black Coffee Only (NO MILK, CREAM OR POWDERED CREAMER of any kind), and Gatorade.    Take these medicines the morning of surgery with A SIP OF WATER: Amlodipine  (Norvasc ) Atorvastatin  (Lipitor) Esomeprazole  (Nexium ) Linzess  Sertraline (Zoloft) Tamsulosin  (Flomax )    May take these medicines IF NEEDED: Albuterol  (Ventolin  HFA) inhaler - bring with you on day of surgery Alprazolam  (Xanax ) Azleastine (Astelin ) Nasal spray Fluticasone  (Flonase ) Ondansetron  (Zofran )    One week prior to surgery, STOP taking any Aspirin  (unless otherwise instructed by your surgeon) Aleve, Naproxen, Ibuprofen, Motrin, Advil, Goody's, BC's, all herbal medications, fish oil, and non-prescription vitamins.  This includes Celecoxib  (Celebrex ).    WHAT DO I DO ABOUT MY DIABETES MEDICATION?   Tirzepatide  (Mounjaro ) should be stopped at least 7 days prior to your surgery.  Your last dose of Tirzepatide  (Mounjaro ) should be on or before Monday, November 17th.    THE NIGHT BEFORE SURGERY, do NOT take Insulin  Lispro (Humalog ).    On THE MORNING OF SURGERY, IF your blood sugar is greater than 220, take _1/2 of your usual dose__ units of _Insulin  Lispro (Humalog )_insulin.  Do NOT take Metformin  (Glucophage ) on the day of surgery.    HOW TO MANAGE YOUR DIABETES BEFORE AND AFTER SURGERY  Why is it important to control my blood sugar before and after surgery? Improving blood sugar levels before and after surgery helps healing and can limit problems. A way of improving blood sugar control is eating a healthy diet by:  Eating less sugar and carbohydrates  Increasing activity/exercise  Talking with your doctor about reaching your blood sugar goals High blood sugars (greater than 180 mg/dL) can raise your risk of infections and slow your recovery, so you will need to focus on controlling your diabetes during the weeks before surgery. Make sure that the doctor who takes care of your diabetes knows about your planned surgery including the date and location.  How do I manage my blood sugar before surgery? Check your blood sugar at least 4 times a day, starting 2 days before surgery, to make sure that the level is not too high or low.  Check your blood sugar the morning of your surgery when you wake up and every 2 hours until you get to the Short Stay unit.  If your blood sugar is less than 70 mg/dL, you will need to treat for low blood sugar: Do not take insulin . Treat a low blood sugar (less than 70 mg/dL) with  cup of clear juice (cranberry or apple), 4 glucose tablets, OR glucose gel. Recheck blood sugar in 15 minutes after treatment (to make sure it is greater than 70 mg/dL). If your blood sugar is not greater than 70 mg/dL on recheck, call [phone] for  further instructions. Report your blood sugar to the short stay nurse when you get to Short Stay.  If you are admitted to the hospital after surgery: Your blood sugar will be checked by the staff and you will probably be given insulin  after surgery (instead of oral diabetes medicines) to make sure you have good blood sugar levels. The goal for blood sugar control after surgery is  80-180 mg/dL.                      Do NOT Smoke (Tobacco/Vaping) for 24 hours prior to your procedure.  If you use a CPAP at night, you may bring your mask/headgear for your overnight stay.   You will be asked to remove any contacts, glasses, piercing's, hearing aid's, dentures/partials prior to surgery. Please bring cases for these items if needed.    Patients discharged the day of surgery will not be allowed to drive home, and someone needs to stay with them for 24 hours.  SURGICAL WAITING ROOM VISITATION Patients may have no more than 2 support people in the waiting area - these visitors may rotate.   Pre-op nurse will coordinate an appropriate time for 1 ADULT support person, who may not rotate, to accompany patient in pre-op.  Children under the age of 2 must have an adult with them who is not the patient and must remain in the main waiting area with an adult.  If the patient needs to stay at the hospital during part of their recovery, the visitor guidelines for inpatient rooms apply.  Please refer to the [facility] website for the visitor guidelines for any additional information.   If you received a COVID test during your pre-op visit  it is requested that you wear a mask when out in public, stay away from anyone that may not be feeling well and notify your surgeon if you develop symptoms. If you have been in contact with anyone that has tested positive in the last 10 days please notify you surgeon.      Pre-operative CHG Bathing Instructions   You can play a key role in reducing the risk of infection after surgery. Your skin needs to be as free of germs as possible. You can reduce the number of germs on your skin by washing with CHG (chlorhexidine  gluconate) soap before surgery. CHG is an antiseptic soap that kills germs and continues to kill germs even after washing.   DO NOT use if you have an allergy to chlorhexidine /CHG or antibacterial soaps. If your skin becomes  reddened or irritated, stop using the CHG and notify one of our RNs at [phone].              TAKE A SHOWER THE NIGHT BEFORE SURGERY   Please keep in mind the following:  DO NOT shave, including legs and underarms, 48 hours prior to surgery.   You may shave your face before/day of surgery.  Place clean sheets on your bed the night before surgery Use a clean washcloth (not used since being washed) for shower. DO NOT sleep with pet's night before surgery.  CHG Shower Instructions:  Wash your face and private area with normal soap. If you choose to wash your hair, wash first with your normal shampoo.  After you use shampoo/soap, rinse your hair and body thoroughly to remove shampoo/soap residue.  Turn the water OFF and apply half the bottle of CHG soap to a CLEAN washcloth.  Apply CHG soap ONLY  FROM YOUR NECK DOWN TO YOUR TOES (washing for 3-5 minutes)  DO NOT use CHG soap on face, private areas, open wounds, or sores.  Pay special attention to the area where your surgery is being performed.  If you are having back surgery, having someone wash your back for you may be helpful. Wait 2 minutes after CHG soap is applied, then you may rinse off the CHG soap.  Pat dry with a clean towel  Put on clean pajamas    Additional instructions for the day of surgery: If you choose, you may shower the morning of surgery with an antibacterial soap.  DO NOT APPLY any lotions, deodorants, cologne, or perfumes.   Do not wear jewelry or makeup Do not wear nail polish, gel polish, artificial nails, or any other type of covering on natural nails (fingers and toes) Do not bring valuables to the hospital. [facility] is not responsible for valuables/personal belongings. Put on clean/comfortable clothes.  Please brush your teeth.  Ask your nurse before applying any prescription medications to the skin.

## 2023-12-27 ENCOUNTER — Encounter (HOSPITAL_COMMUNITY)
Admission: RE | Admit: 2023-12-27 | Discharge: 2023-12-27 | Disposition: A | Discharge status: Home / Self Care | Source: Ambulatory Visit | Attending: General Surgery | Admitting: General Surgery

## 2023-12-27 ENCOUNTER — Encounter (HOSPITAL_COMMUNITY): Payer: Self-pay

## 2023-12-27 ENCOUNTER — Other Ambulatory Visit: Payer: Self-pay

## 2023-12-27 VITALS — BP 144/78 | HR 70 | Temp 97.6°F | Resp 18 | Ht 69.0 in | Wt 172.7 lb

## 2023-12-27 DIAGNOSIS — Z794 Long term (current) use of insulin: Secondary | ICD-10-CM | POA: Insufficient documentation

## 2023-12-27 DIAGNOSIS — Z01818 Encounter for other preprocedural examination: Secondary | ICD-10-CM | POA: Insufficient documentation

## 2023-12-27 DIAGNOSIS — E119 Type 2 diabetes mellitus without complications: Secondary | ICD-10-CM | POA: Insufficient documentation

## 2023-12-27 HISTORY — DX: Unspecified osteoarthritis, unspecified site: M19.90

## 2023-12-27 LAB — HEMOGLOBIN A1C
Hgb A1c MFr Bld: 6.2 % — ABNORMAL HIGH (ref 4.8–5.6)
Mean Plasma Glucose: 131.24 mg/dL

## 2023-12-27 LAB — GLUCOSE, CAPILLARY: Glucose-Capillary: 135 mg/dL — ABNORMAL HIGH (ref 70–99)

## 2023-12-27 NOTE — Progress Notes (Signed)
 PCP - Dr. Jamee Antonio Meth Cardiologist -   PPM/ICD - denies Device Orders - na Rep Notified - na  Chest x-ray - na EKG - PAT, 12/27/2023 Stress Test - 01/12/2011 ECHO - 03/26/2012 Cardiac Cath -   Sleep Study - denies CPAP - na  Type II Diabetic.  A1C drawn at PAT. Blood sugar 135 at PAT Fasting Blood Sugar : 120-135 Checks Blood Sugar: daily  Last dose of GLP1 agonist-  Mounjaro  GLP1 instructions: hold 7 days, states last dose 12/19/2023  Blood Thinner Instructions:denies Aspirin  Instructions:denies  ERAS Protcol - Clears until 1115  Anesthesia review: No  Patient denies shortness of breath, fever, cough and chest pain at PAT appointment   All instructions explained to the patient, with a verbal understanding of the material. Patient agrees to go over the instructions while at home for a better understanding. Patient also instructed to self quarantine after being tested for COVID-19. The opportunity to ask questions was provided.

## 2024-01-01 ENCOUNTER — Encounter (HOSPITAL_COMMUNITY): Payer: Self-pay | Admitting: General Surgery

## 2024-01-02 ENCOUNTER — Ambulatory Visit (HOSPITAL_COMMUNITY)

## 2024-01-02 ENCOUNTER — Encounter (HOSPITAL_COMMUNITY): Payer: Self-pay | Admitting: General Surgery

## 2024-01-02 ENCOUNTER — Other Ambulatory Visit: Payer: Self-pay

## 2024-01-02 ENCOUNTER — Encounter (HOSPITAL_COMMUNITY)
Admission: RE | Disposition: A | Discharge status: Home / Self Care | Payer: Self-pay | Source: Home / Self Care | Attending: General Surgery

## 2024-01-02 ENCOUNTER — Ambulatory Visit (HOSPITAL_COMMUNITY)
Admission: RE | Admit: 2024-01-02 | Discharge: 2024-01-02 | Disposition: A | Discharge status: Home / Self Care | Attending: General Surgery | Admitting: General Surgery

## 2024-01-02 DIAGNOSIS — E119 Type 2 diabetes mellitus without complications: Secondary | ICD-10-CM | POA: Insufficient documentation

## 2024-01-02 DIAGNOSIS — Z87891 Personal history of nicotine dependence: Secondary | ICD-10-CM | POA: Diagnosis not present

## 2024-01-02 DIAGNOSIS — Z794 Long term (current) use of insulin: Secondary | ICD-10-CM | POA: Diagnosis not present

## 2024-01-02 DIAGNOSIS — Z7985 Long-term (current) use of injectable non-insulin antidiabetic drugs: Secondary | ICD-10-CM | POA: Insufficient documentation

## 2024-01-02 DIAGNOSIS — K4090 Unilateral inguinal hernia, without obstruction or gangrene, not specified as recurrent: Secondary | ICD-10-CM | POA: Insufficient documentation

## 2024-01-02 DIAGNOSIS — K439 Ventral hernia without obstruction or gangrene: Secondary | ICD-10-CM

## 2024-01-02 DIAGNOSIS — Z7984 Long term (current) use of oral hypoglycemic drugs: Secondary | ICD-10-CM | POA: Insufficient documentation

## 2024-01-02 DIAGNOSIS — K219 Gastro-esophageal reflux disease without esophagitis: Secondary | ICD-10-CM | POA: Diagnosis not present

## 2024-01-02 DIAGNOSIS — F411 Generalized anxiety disorder: Secondary | ICD-10-CM | POA: Diagnosis not present

## 2024-01-02 DIAGNOSIS — I10 Essential (primary) hypertension: Secondary | ICD-10-CM | POA: Diagnosis not present

## 2024-01-02 HISTORY — PX: XI ROBOTIC ASSISTED VENTRAL HERNIA: SHX6789

## 2024-01-02 LAB — GLUCOSE, CAPILLARY
Glucose-Capillary: 133 mg/dL — ABNORMAL HIGH (ref 70–99)
Glucose-Capillary: 168 mg/dL — ABNORMAL HIGH (ref 70–99)

## 2024-01-02 SURGERY — REPAIR, HERNIA, VENTRAL, ROBOT-ASSISTED
Anesthesia: General

## 2024-01-02 MED ORDER — LIDOCAINE 2% (20 MG/ML) 5 ML SYRINGE
INTRAMUSCULAR | Status: DC | PRN
  Administered 2024-01-02: 60 mg via INTRAVENOUS

## 2024-01-02 MED ORDER — ACETAMINOPHEN 500 MG PO TABS
ORAL_TABLET | ORAL | Status: AC
  Filled 2024-01-02: qty 2

## 2024-01-02 MED ORDER — OXYCODONE HCL 5 MG/5ML PO SOLN: 5.0000 mg | Freq: Once | ORAL | Status: DC | PRN

## 2024-01-02 MED ORDER — PHENYLEPHRINE 80 MCG/ML (10ML) SYRINGE FOR IV PUSH (FOR BLOOD PRESSURE SUPPORT)
PREFILLED_SYRINGE | INTRAVENOUS | Status: AC
  Filled 2024-01-02: qty 10

## 2024-01-02 MED ORDER — CHLORHEXIDINE GLUCONATE 0.12 % MT SOLN
OROMUCOSAL | Status: AC
  Filled 2024-01-02: qty 15

## 2024-01-02 MED ORDER — VASOPRESSIN 20 UNIT/ML IV SOLN
INTRAVENOUS | Status: DC | PRN
  Administered 2024-01-02 (×2): .5 [IU] via INTRAVENOUS
  Administered 2024-01-02: 1 [IU] via INTRAVENOUS
  Administered 2024-01-02: .5 [IU] via INTRAVENOUS

## 2024-01-02 MED ORDER — LIDOCAINE 2% (20 MG/ML) 5 ML SYRINGE
INTRAMUSCULAR | Status: AC
Start: 2024-01-02 — End: 2024-01-02
  Filled 2024-01-02: qty 5

## 2024-01-02 MED ORDER — ACETAMINOPHEN 10 MG/ML IV SOLN: 1000.0000 mg | Freq: Once | INTRAVENOUS | Status: DC | PRN

## 2024-01-02 MED ORDER — ALBUMIN HUMAN 5 % IV SOLN: INTRAVENOUS | Status: DC | PRN

## 2024-01-02 MED ORDER — VASOPRESSIN 20 UNIT/ML IV SOLN
INTRAVENOUS | Status: AC
  Filled 2024-01-02: qty 1

## 2024-01-02 MED ORDER — FENTANYL CITRATE (PF) 100 MCG/2ML IJ SOLN
25.0000 ug | INTRAMUSCULAR | Status: DC | PRN
  Administered 2024-01-02: 25 ug via INTRAVENOUS

## 2024-01-02 MED ORDER — SUGAMMADEX SODIUM 200 MG/2ML IV SOLN
INTRAVENOUS | Status: DC | PRN
  Administered 2024-01-02: 200 mg via INTRAVENOUS

## 2024-01-02 MED ORDER — CHLORHEXIDINE GLUCONATE 0.12 % MT SOLN
15.0000 mL | Freq: Once | OROMUCOSAL | Status: AC
  Administered 2024-01-02: 15 mL via OROMUCOSAL

## 2024-01-02 MED ORDER — FENTANYL CITRATE (PF) 100 MCG/2ML IJ SOLN
INTRAMUSCULAR | Status: AC
  Filled 2024-01-02: qty 2

## 2024-01-02 MED ORDER — OXYCODONE HCL 5 MG PO TABS: 5.0000 mg | ORAL_TABLET | Freq: Once | ORAL | Status: DC | PRN

## 2024-01-02 MED ORDER — DROPERIDOL 2.5 MG/ML IJ SOLN
0.6250 mg | Freq: Once | INTRAMUSCULAR | Status: DC | PRN
Start: 2024-01-02 — End: 2024-01-02

## 2024-01-02 MED ORDER — PHENYLEPHRINE HCL (PRESSORS) 10 MG/ML IV SOLN
INTRAVENOUS | Status: AC
  Filled 2024-01-02: qty 1

## 2024-01-02 MED ORDER — PROPOFOL 10 MG/ML IV BOLUS
INTRAVENOUS | Status: AC
  Filled 2024-01-02: qty 20

## 2024-01-02 MED ORDER — BUPIVACAINE HCL 0.25 % IJ SOLN
INTRAMUSCULAR | Status: DC | PRN
  Administered 2024-01-02: 8 mL

## 2024-01-02 MED ORDER — EPHEDRINE SULFATE-NACL 50-0.9 MG/10ML-% IV SOSY
PREFILLED_SYRINGE | INTRAVENOUS | Status: DC | PRN
  Administered 2024-01-02: 10 mg via INTRAVENOUS
  Administered 2024-01-02 (×2): 5 mg via INTRAVENOUS

## 2024-01-02 MED ORDER — ORAL CARE MOUTH RINSE: 15.0000 mL | Freq: Once | OROMUCOSAL | Status: AC

## 2024-01-02 MED ORDER — PROPOFOL 10 MG/ML IV BOLUS
INTRAVENOUS | Status: DC | PRN
Start: 2024-01-02 — End: 2024-01-02
  Administered 2024-01-02: 150 mg via INTRAVENOUS

## 2024-01-02 MED ORDER — VANCOMYCIN HCL IN DEXTROSE 1-5 GM/200ML-% IV SOLN: 1000.0000 mg | INTRAVENOUS | Status: AC

## 2024-01-02 MED ORDER — DEXAMETHASONE SOD PHOSPHATE PF 10 MG/ML IJ SOLN
INTRAMUSCULAR | Status: DC | PRN
  Administered 2024-01-02: 5 mg via INTRAVENOUS

## 2024-01-02 MED ORDER — ONDANSETRON HCL 4 MG/2ML IJ SOLN
INTRAMUSCULAR | Status: DC | PRN
  Administered 2024-01-02: 4 mg via INTRAVENOUS

## 2024-01-02 MED ORDER — ROCURONIUM BROMIDE 10 MG/ML (PF) SYRINGE
PREFILLED_SYRINGE | INTRAVENOUS | Status: DC | PRN
  Administered 2024-01-02: 60 mg via INTRAVENOUS

## 2024-01-02 MED ORDER — CHLORHEXIDINE GLUCONATE CLOTH 2 % EX PADS: 6.0000 | MEDICATED_PAD | Freq: Once | CUTANEOUS | Status: DC

## 2024-01-02 MED ORDER — PHENYLEPHRINE HCL-NACL 20-0.9 MG/250ML-% IV SOLN
INTRAVENOUS | Status: DC | PRN
  Administered 2024-01-02: 20 ug/min via INTRAVENOUS

## 2024-01-02 MED ORDER — TRAMADOL HCL 50 MG PO TABS
50.0000 mg | ORAL_TABLET | Freq: Four times a day (QID) | ORAL | 0 refills | Status: AC | PRN
Start: 2024-01-02 — End: 2025-01-01

## 2024-01-02 MED ORDER — LACTATED RINGERS IV SOLN: INTRAVENOUS | Status: DC

## 2024-01-02 MED ORDER — ACETAMINOPHEN 500 MG PO TABS
1000.0000 mg | ORAL_TABLET | ORAL | Status: AC
  Administered 2024-01-02: 1000 mg via ORAL

## 2024-01-02 MED ORDER — FENTANYL CITRATE (PF) 250 MCG/5ML IJ SOLN
INTRAMUSCULAR | Status: DC | PRN
  Administered 2024-01-02: 100 ug via INTRAVENOUS

## 2024-01-02 MED ORDER — INSULIN ASPART 100 UNIT/ML IJ SOLN
0.0000 [IU] | INTRAMUSCULAR | Status: DC | PRN
  Filled 2024-01-02: qty 0.07

## 2024-01-02 MED ORDER — SODIUM CHLORIDE 0.9 % IV SOLN: INTRAVENOUS | Status: DC | PRN

## 2024-01-02 MED ORDER — 0.9 % SODIUM CHLORIDE (POUR BTL) OPTIME
TOPICAL | Status: DC | PRN
  Administered 2024-01-02: 1000 mL

## 2024-01-02 MED ORDER — VANCOMYCIN HCL IN DEXTROSE 1-5 GM/200ML-% IV SOLN
INTRAVENOUS | Status: AC
  Administered 2024-01-02: 1000 mg via INTRAVENOUS
  Filled 2024-01-02: qty 200

## 2024-01-02 MED ORDER — ONDANSETRON HCL 4 MG/2ML IJ SOLN
INTRAMUSCULAR | Status: AC
  Filled 2024-01-02: qty 2

## 2024-01-02 SURGICAL SUPPLY — 49 items
APPLICATOR VISTASEAL 35 (MISCELLANEOUS) IMPLANT
BAG COUNTER SPONGE SURGICOUNT (BAG) IMPLANT
BINDER ABDOMINAL 10 UNV 27-48 (MISCELLANEOUS) IMPLANT
BINDER ABDOMINAL 12 ML 46-62 (SOFTGOODS) IMPLANT
CHLORAPREP W/TINT 26 (MISCELLANEOUS) ×2 IMPLANT
COVER MAYO STAND STRL (DRAPES) ×2 IMPLANT
COVER SURGICAL LIGHT HANDLE (MISCELLANEOUS) ×2 IMPLANT
COVER TIP SHEARS 8 DVNC (MISCELLANEOUS) ×2 IMPLANT
DEFOGGER SCOPE WARM SEASHARP (MISCELLANEOUS) ×2 IMPLANT
DERMABOND ADVANCED .7 DNX12 (GAUZE/BANDAGES/DRESSINGS) ×2 IMPLANT
DEVICE SECURE STRAP 25 ABSORB (INSTRUMENTS) IMPLANT
DEVICE TROCAR PUNCTURE CLOSURE (ENDOMECHANICALS) ×2 IMPLANT
DRAPE ARM DVNC X/XI (DISPOSABLE) ×8 IMPLANT
DRAPE COLUMN DVNC XI (DISPOSABLE) ×2 IMPLANT
DRAPE CV SPLIT W-CLR ANES SCRN (DRAPES) ×2 IMPLANT
DRAPE SURG ORHT 6 SPLT 77X108 (DRAPES) ×2 IMPLANT
DRIVER NDL MEGA SUTCUT DVNCXI (INSTRUMENTS) ×2 IMPLANT
FORCEPS BPLR FENES DVNC XI (FORCEP) ×2 IMPLANT
FORCEPS PROGRASP DVNC XI (FORCEP) ×2 IMPLANT
GLOVE BIO SURGEON STRL SZ7.5 (GLOVE) ×4 IMPLANT
GLOVE SURG LATEX 7.5 PF (GLOVE) ×4 IMPLANT
GOWN STRL REUS W/ TWL LRG LVL3 (GOWN DISPOSABLE) ×4 IMPLANT
GOWN STRL REUS W/ TWL XL LVL3 (GOWN DISPOSABLE) ×4 IMPLANT
GOWN STRL REUS W/TWL 2XL LVL3 (GOWN DISPOSABLE) ×2 IMPLANT
IRRIGATION SUCT STRKRFLW 2 WTP (MISCELLANEOUS) IMPLANT
KIT BASIN OR (CUSTOM PROCEDURE TRAY) ×2 IMPLANT
KIT TURNOVER KIT B (KITS) ×2 IMPLANT
MARKER SKIN DUAL TIP RULER LAB (MISCELLANEOUS) ×2 IMPLANT
MESH PROGRIP LAP SLF FIX 16X12 (Mesh General) IMPLANT
NDL HYPO 22X1.5 SAFETY MO (MISCELLANEOUS) ×2 IMPLANT
OBTURATOR OPTICALSTD 8 DVNC (TROCAR) IMPLANT
PAD ARMBOARD POSITIONER FOAM (MISCELLANEOUS) ×4 IMPLANT
PENCIL SMOKE EVACUATOR (MISCELLANEOUS) IMPLANT
SCISSORS LAP 5X35 DISP (ENDOMECHANICALS) IMPLANT
SCISSORS MNPLR CVD DVNC XI (INSTRUMENTS) ×2 IMPLANT
SEAL UNIV 5-12 XI (MISCELLANEOUS) ×6 IMPLANT
SET TUBE SMOKE EVAC HIGH FLOW (TUBING) ×2 IMPLANT
SPIKE FLUID TRANSFER (MISCELLANEOUS) ×2 IMPLANT
STOPCOCK 4 WAY LG BORE MALE ST (IV SETS) ×2 IMPLANT
SUT MNCRL AB 4-0 PS2 18 (SUTURE) ×2 IMPLANT
SUT STRATA 2-0 15 CT-1.5 (SUTURE) ×2 IMPLANT
SUT STRATA PDS 2-0 23 CT-1 (SUTURE) IMPLANT
SUT STRATAFIX PDS+0 CT1 9 (SUTURE) IMPLANT
SUT STRATAFIX SPIRAL PDS+ 0 30 (SUTURE) IMPLANT
SUT VIC AB 2-0 SH 27X BRD (SUTURE) IMPLANT
SUT VICRYL 0 UR6 27IN ABS (SUTURE) IMPLANT
TOWEL GREEN STERILE FF (TOWEL DISPOSABLE) ×2 IMPLANT
TRAY LAPAROSCOPIC MC (CUSTOM PROCEDURE TRAY) ×2 IMPLANT
TROCAR XCEL NON-BLD 5MMX100MML (ENDOMECHANICALS) IMPLANT

## 2024-01-02 NOTE — Discharge Instructions (Addendum)
 [facility] _______[facility]  HERNIA REPAIR: POST OP INSTRUCTIONS Always review your discharge instruction sheet given to you by the facility where your surgery was performed. IF YOU HAVE DISABILITY OR FAMILY LEAVE FORMS, YOU MUST BRING THEM TO THE OFFICE FOR PROCESSING.   DO NOT GIVE THEM TO YOUR DOCTOR.  1. A  prescription for pain medication may be given to you upon discharge.  Take your pain medication as prescribed, if needed.  If narcotic pain medicine is not needed, then you may take acetaminophen  (Tylenol ) or ibuprofen (Advil) as needed. 2. Take your usually prescribed medications unless otherwise directed. If you need a refill on your pain medication, please contact your pharmacy.  They will contact our office to request authorization. Prescriptions will not be filled after 5 pm or on week-ends. 3. You should follow a light diet the first 24 hours after arrival home, such as soup and crackers, etc.  Be sure to include lots of fluids daily.  Resume your normal diet the day after surgery. 4.Most patients will experience some swelling and bruising around the umbilicus or in the groin and scrotum.  Ice packs and reclining will help.  Swelling and bruising can take several days to resolve.  6. It is common to experience some constipation if taking pain medication after surgery.  Increasing fluid intake and taking a stool softener (such as Colace) will usually help or prevent this problem from occurring.  A mild laxative (Milk of Magnesia or Miralax) should be taken according to package directions if there are no bowel movements after 48 hours. 7. Unless discharge instructions indicate otherwise, you may remove your bandages 24-48 hours after surgery, and you may shower at that time.  You may have steri-strips (small skin tapes) in place directly over the incision.  These strips should be left on the skin for 7-10 days.  If your surgeon used skin glue on the incision, you may shower in 24  hours.  The glue will flake off over the next 2-3 weeks.  Any sutures or staples will be removed at the office during your follow-up visit. 8. ACTIVITIES:  You may resume regular (light) daily activities beginning the next day--such as daily self-care, walking, climbing stairs--gradually increasing activities as tolerated.  You may have sexual intercourse when it is comfortable.  Refrain from any heavy lifting or straining until approved by your doctor.  a.You may drive when you are no longer taking prescription pain medication, you can comfortably wear a seatbelt, and you can safely maneuver your car and apply brakes. b.RETURN TO WORK:   _____________________________________________  9.You should see your doctor in the office for a follow-up appointment approximately 2-3 weeks after your surgery.  Make sure that you call for this appointment within a day or two after you arrive home to insure a convenient appointment time. 10.OTHER INSTRUCTIONS: _________________________    _____________________________________  WHEN TO CALL YOUR DOCTOR: Fever over 101.0 Inability to urinate Nausea and/or vomiting Extreme swelling or bruising Continued bleeding from incision. Increased pain, redness, or drainage from the incision  The clinic staff is available to answer your questions during regular business hours.  Please don't hesitate to call and ask to speak to one of the nurses for clinical concerns.  If you have a medical emergency, go to the nearest emergency room or call 911.  A surgeon from [facility] is always on call at the hospital   [address], [place], [place]  [address] ?  [id number], [place], [place]   [address] [phone] ? [phone] ? FAX [phone] Web site: [email or web address]        No ibuprofen, Advil, Aleve, Motrin, ketorolac, meloxicam , naproxen, or other NSAIDS until after 6:40 pm today if needed.

## 2024-01-02 NOTE — Anesthesia Preprocedure Evaluation (Signed)
 Anesthesia Evaluation  Patient identified by MRN, date of birth, ID band Patient awake    Reviewed: Allergy & Precautions, NPO status , Patient's Chart, lab work & pertinent test results  Airway Mallampati: II  TM Distance: >3 FB Neck ROM: Full    Dental  (+) Teeth Intact, Dental Advisory Given   Pulmonary asthma , former smoker   breath sounds clear to auscultation       Cardiovascular hypertension, Pt. on medications  Rhythm:Regular Rate:Normal     Neuro/Psych  PSYCHIATRIC DISORDERS Anxiety      Neuromuscular disease    GI/Hepatic Neg liver ROS,GERD  Medicated,,  Endo/Other  diabetes, Type 2, Insulin  Dependent, Oral Hypoglycemic Agents    Renal/GU negative Renal ROS     Musculoskeletal  (+) Arthritis ,    Abdominal   Peds  Hematology negative hematology ROS (+)   Anesthesia Other Findings   Reproductive/Obstetrics                              Anesthesia Physical Anesthesia Plan  ASA: 2  Anesthesia Plan: General   Post-op Pain Management: Tylenol  PO (pre-op)* and Toradol IV (intra-op)*   Induction: Intravenous  PONV Risk Score and Plan: 3 and Ondansetron  and Treatment may vary due to age or medical condition  Airway Management Planned: Oral ETT  Additional Equipment: None  Intra-op Plan:   Post-operative Plan: Extubation in OR  Informed Consent: I have reviewed the patients History and Physical, chart, labs and discussed the procedure including the risks, benefits and alternatives for the proposed anesthesia with the patient or authorized representative who has indicated his/her understanding and acceptance.     Dental advisory given  Plan Discussed with: CRNA  Anesthesia Plan Comments:          Anesthesia Quick Evaluation

## 2024-01-02 NOTE — H&P (Signed)
 Chief Complaint:  NEW PROBLEM ( SAW DR.TSUEI ABOUT HERNIA WANTS LAP SURGERY NOT OPEN)       History of Present Illness: Bruce Romero is a 78 y.o. male who is seen today as an office consultation at the request of Dr. Belinda for evaluation of  NEW PROBLEM ( SAW DR.TSUEI ABOUT HERNIA WANTS LAP SURGERY NOT OPEN) .   L speglian hernia   History of Present Illness Bruce Romero is a 78 year old male who presents for surgical consultation for a ventral hernia. He was referred by Dr. Sharran for evaluation of a hernia.   Ventral hernia - Hernia located on the left side, first noticed 1-2 months ago after lifting a six-foot countertop - No pain, but occasional mild aching at night - Able to perform activities such as fishing without limitation, with assistance for heavy lifting - Inquires about minimally invasive surgical options (robotic or laparoscopic) and postoperative recovery, specifically regarding pain and ability to climb stairs   Postoperative hyperglycemia - Experiences hyperglycemia in response to surgery, particularly with steroid use - Requires sliding scale insulin  pen postoperatively - Takes Migerra and metformin  for diabetes management   Hypertension - Blood pressure is controlled   Functional status and lifestyle - Remains active and exercises regularly - Recently experienced weight loss - Does not smoke     Review of Systems: A complete review of systems was obtained from the patient.  I have reviewed this information and discussed as appropriate with the patient.  See HPI as well for other ROS.   Review of Systems  Constitutional:  Negative for fever.  HENT:  Negative for congestion.   Eyes:  Negative for blurred vision.  Respiratory:  Negative for cough, shortness of breath and wheezing.   Cardiovascular:  Negative for chest pain and palpitations.  Gastrointestinal:  Negative for heartburn.  Genitourinary:  Negative for dysuria.  Musculoskeletal:  Negative  for myalgias.  Skin:  Negative for rash.  Neurological:  Negative for dizziness and headaches.  Psychiatric/Behavioral:  Negative for depression and suicidal ideas.   All other systems reviewed and are negative.       Medical History:     Past Medical History:  Diagnosis Date   Acute medial meniscal tear, left, initial encounter     Anxiety     Arthritis     Diabetes mellitus without complication (CMS/HHS-HCC)     GERD (gastroesophageal reflux disease)     Hyperlipidemia     Hypertension     Irritable bowel     Spastic colon           Patient Active Problem List  Diagnosis   Bronchospasm   Corneal abrasion of both eyes   Diabetes (CMS/HHS-HCC)   Esophageal reflux   Essential hypertension, benign   Allergic rhinitis   Generalized anxiety disorder   Hyperlipidemia associated with type 2 diabetes mellitus (CMS/HHS-HCC)   Palpitations   RLS (restless legs syndrome)   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 Took Bactrim and Flexeril  at same time had lip swelling unsure which caused reaction.            Current Outpatient Medications on File Prior to Visit  Medication Sig Dispense Refill   albuterol  MDI, PROVENTIL , VENTOLIN , PROAIR , HFA 90 mcg/actuation inhaler Inhale 2 inhalations into the lungs every 6 (six) hours as needed for Wheezing       ALPRAZolam  (XANAX ) 0.25 MG tablet Take 0.25 mg by mouth 3 (three) times daily as needed       amLODIPine  (NORVASC )  10 MG tablet Take 10 mg by mouth once daily       atorvastatin  (LIPITOR) 10 MG tablet Take 10 mg by mouth once daily       azelastine  (ASTELIN ) 137 mcg nasal spray PLACE 1 SPRAY IN EACH NOSTRIL TWICE DAILY       candesartan  (ATACAND ) 16 MG tablet TAKE 1 TABLET(16 MG) BY MOUTH AT BEDTIME       cholecalciferol (VITAMIN D3) 2,000 unit tablet Take 2,000 Units by mouth once daily       esomeprazole  (NEXIUM ) 40 MG DR capsule Take 40 mg by mouth once daily       fluticasone  propionate (FLONASE ) 50 mcg/actuation nasal spray Place into one nostril       linaCLOtide  (LINZESS ) 290 mcg capsule TAKE 1 CAPSULE(290 MCG) BY MOUTH DAILY BEFORE BREAKFAST       metFORMIN  (GLUCOPHAGE -XR) 750 MG XR tablet Take 750 mg by mouth 2 (two) times daily       montelukast  (SINGULAIR ) 10 mg tablet Take 10 mg by mouth at bedtime       niacin  500 mg CR tablet Take 500 mg by mouth at bedtime       tamsulosin  (FLOMAX ) 0.4 mg capsule Take 0.4 mg by mouth once daily       tirzepatide  (MOUNJARO ) 7.5 mg/0.5 mL pen injector          No current facility-administered medications on file prior to visit.           Family History  Problem Relation Age of Onset   High blood pressure (Hypertension) Mother     Stroke Mother     Pulmonary embolism Father     Skin cancer Brother     Diabetes Brother        Social History        Tobacco Use  Smoking Status Former   Types: Cigarettes  Smokeless Tobacco Never      Social History         Socioeconomic History   Marital status: Unknown  Tobacco Use   Smoking status: Former      Types: Cigarettes   Smokeless tobacco: Never  Vaping Use   Vaping status: Unknown  Substance and Sexual Activity   Alcohol use: Yes      Alcohol/week: 0.0 - 1.0 standard drinks of alcohol   Drug use: Never    Social Drivers of Acupuncturist Strain: Low Risk  (10/15/2023)    Received from [facility]    Overall Financial Resource Strain (CARDIA)     How hard is it for you  to pay for the very basics like food, housing, medical care, and heating?: Not very hard  Food Insecurity: No Food Insecurity (10/15/2023)  Received from [facility]    Hunger Vital Sign     Within the past 12 months, you worried that your food would run out before you got the money to buy more.: Never true     Within the past 12 months, the food you bought just didn't last and you didn't have money to get more.: Never true  Transportation Needs: No Transportation Needs (10/15/2023)    Received from [facility] - Transportation     In the past 12 months, has lack of transportation kept you from medical appointments or from getting medications?: No     In the past 12 months, has lack of transportation kept you from meetings, work, or from getting things needed for daily living?: No  Physical Activity: Insufficiently Active (10/15/2023)    Received from [facility]    Exercise Vital Sign     On average, how many days per week do you engage in moderate to strenuous exercise (like a brisk walk)?: 2 days     On average, how many minutes do you engage in exercise at this level?: 10 min  Stress: Stress Concern Present (10/15/2023)    Received from [facility] - Occupational Stress Questionnaire     Do you feel stress - tense, restless, nervous, or anxious, or unable to sleep at night because your mind is troubled all the time - these days?: Rather much  Social Connections: Moderately Isolated (10/15/2023)    Received from [facility]    Social Connection and Isolation Panel     In a typical week, how many times do you talk on the phone with family, friends, or neighbors?: Three times a week     How often do you get together with friends or relatives?: Three times a week     How often do you attend church or religious services?: Patient declined     Do you belong to any clubs or organizations such as church groups, unions, fraternal or athletic groups,  or school groups?: No     Are you married, widowed, divorced, separated, never married, or living with a partner?: Married  Housing Stability: Unknown (11/20/2023)    Housing Stability Vital Sign     Homeless in the Last Year: No      Objective:     BP 138/82   Pulse 81   Temp 97.6 F (36.4 C) (Oral)   Resp 18   Ht 5' 9 (1.753 m)   Wt 78 kg   SpO2 98%   BMI 25.40 kg/m     Physical Exam Constitutional:      General: He is not in acute distress.    Appearance: Normal appearance.  HENT:     Head: Normocephalic.     Nose: No rhinorrhea.     Mouth/Throat:     Mouth: Mucous membranes are moist.     Pharynx: Oropharynx is clear.  Eyes:     General: No scleral icterus.    Pupils: Pupils are equal, round, and reactive to light.  Cardiovascular:     Rate and Rhythm: Normal rate.     Pulses: Normal pulses.  Pulmonary:     Effort: Pulmonary effort is normal. No respiratory distress.     Breath sounds: No stridor. No wheezing.  Abdominal:     General: Abdomen is flat. There is no distension.     Tenderness: There is no abdominal tenderness. There  is no guarding or rebound.     Hernia: A hernia (L speglian hernia) is present.  Musculoskeletal:        General: Normal range of motion.     Cervical back: Normal range of motion and neck supple.  Skin:    General: Skin is warm and dry.     Capillary Refill: Capillary refill takes less than 2 seconds.     Coloration: Skin is not jaundiced.  Neurological:     General: No focal deficit present.     Mental Status: He is alert and oriented to person, place, and time. Mental status is at baseline.  Psychiatric:        Mood and Affect: Mood normal.        Thought Content: Thought content normal.        Judgment: Judgment normal.       Hernia Size:2cm Incarcerated: no Initial Hernia     Assessment and Plan:  Diagnoses and all orders for this visit:   Ventral hernia without obstruction or gangrene     Bruce Romero is a  78 y.o. male     We will proceed to the OR for a robo ventral hernia repair with mesh. All risks and benefits were discussed with the patient, to generally include infection, bleeding, damage to surrounding structures, acute and chronic nerve pain, and recurrence. Alternatives were offered and described.  All questions were answered and the patient voiced understanding of the procedure and wishes to proceed at this point.             No follow-ups on file.   Lynda Leos, MD, [facility], [place] [facility]

## 2024-01-02 NOTE — Transfer of Care (Signed)
 Immediate Anesthesia Transfer of Care Note  Patient: Bruce Romero  Procedure(s) Performed: REPAIR, HERNIA, VENTRAL, ROBOT-ASSISTED  Patient Location: PACU  Anesthesia Type:General  Level of Consciousness: drowsy, patient cooperative, and responds to stimulation  Airway & Oxygen Therapy: Patient Spontanous Breathing and Patient connected to face mask oxygen  Post-op Assessment: Report given to RN and Post -op Vital signs reviewed and stable  Post vital signs: Reviewed and stable  Last Vitals:  Vitals Value Taken Time  BP 125/62 01/02/24 15:00  Temp    Pulse 96 01/02/24 15:02  Resp 17 01/02/24 15:02  SpO2 93 % 01/02/24 15:02  Vitals shown include unfiled device data.  Last Pain:  Vitals:   01/02/24 1230  TempSrc:   PainSc: 0-No pain         Complications: No notable events documented.

## 2024-01-02 NOTE — Anesthesia Procedure Notes (Signed)
 Procedure Name: Intubation Date/Time: 01/02/2024 1:28 PM  Performed by: Erlene Powell POUR, CRNAPre-anesthesia Checklist: Patient identified, Emergency Drugs available, Suction available and Patient being monitored Patient Re-evaluated:Patient Re-evaluated prior to induction Oxygen Delivery Method: Circle system utilized Preoxygenation: Pre-oxygenation with 100% oxygen Induction Type: IV induction Ventilation: Mask ventilation without difficulty Laryngoscope Size: Miller and 2 Grade View: Grade I Tube type: Oral Tube size: 7.5 mm Number of attempts: 1 Airway Equipment and Method: Stylet Placement Confirmation: ETT inserted through vocal cords under direct vision, positive ETCO2 and breath sounds checked- equal and bilateral Secured at: 23 cm Tube secured with: Tape Dental Injury: Teeth and Oropharynx as per pre-operative assessment

## 2024-01-02 NOTE — Op Note (Signed)
 01/02/2024  2:40 PM  PATIENT:  Bruce Romero  78 y.o. male  PRE-OPERATIVE DIAGNOSIS:  Left INGUINAL HERNIA  POST-OPERATIVE DIAGNOSIS:  LEFT INDIRECT INGUINAL HERNIA  PROCEDURE:  Procedure(s) with comments: REPAIR, HERNIA, VENTRAL, ROBOT-ASSISTED (N/A) - ROBOTIC LEFT INGUINAL HERNIA REPAIR WITH MESH  SURGEON:  Surgeons and Role:    * Rubin Calamity, MD - Primary    ASSISTANTS: Waddell Collier, RNFA   ANESTHESIA:   local and general  EBL:  minimal   BLOOD ADMINISTERED:none  DRAINS: none   LOCAL MEDICATIONS USED:  MARCAINE      SPECIMEN:  No Specimen  DISPOSITION OF SPECIMEN:  N/A  COUNTS:  YES  TOURNIQUET:  * No tourniquets in log *  DICTATION: .Dragon Dictation  Findings: Large left indirect hernia defect  Details of the procedure:   The patient was taken back to the operating room. The patient was placed in supine position with bilateral SCDs in place.  The patient was prepped and draped in the usual sterile fashion.  After appropriate anitbiotics were confirmed, a time-out was confirmed and all facts were verified.  At this time a Veress needle technique was used to inspect the abdomen approximately 10 cm from the umbilicus in the paramedian line. At this time a 8 mm robotic trocar was placed into the abdomen. The camera was placed there was no injury to any intra-abdominal organs. A 8mm umbilical port was placed just superior to the umbilicus. An 8 mm port was placed approximately 10 cm lateral to the umbilicus on the left paramedian side.   Robot was positioned over the patient and the ports were docked in the usual fashion.  At this time the left-sided peritoneum was taken down from the medial umbilical ligament laterally. The pre-peritoneal space was entered. Dissection was taken down to Cooper's ligament. At this time it was apparent there was an indirect hernia. I dissected Cooper's ligament and proceeded laterally towards the spermatic cord. The spermatic  cord was circumferentially dissected away from the surrounding musculature and tissue. The vas deferens was identified and protected all portions of the case. The peritoneum was dissected back. At this time I proceeded to create a pocket laterally for the mesh. Once the pocket was created the peritoneum was stripped back to approximately the base of the cord.  A 2-0 stratafix mesh was used to reapproximate the internal ring as it was very wide.  At this time the piece of Progrip 16x12cm mesh was in placed into the dissected area. This covered both the direct and indirect spaces appropriately. This also covered  The femoral space.  The mesh lay flat from medial to lateral. At this time a 2 V- lock stitch was used to close the peritoneum in a standard running fashion.  At this time the robot was undocked. The umbilical port site was reapproximated using a 0 Vicryl via an Endo Close device 1. All ports were removed. The skin was reapproximated and all port sites using 4-0 Monocryl subcuticular fashion.  The patient the procedure well was taken to the recovery     PLAN OF CARE: Discharge to home after PACU  PATIENT DISPOSITION:  PACU - hemodynamically stable.   Delay start of Pharmacological VTE agent (>24hrs) due to surgical blood loss or risk of bleeding: not applicable

## 2024-01-03 ENCOUNTER — Encounter (HOSPITAL_COMMUNITY): Payer: Self-pay | Admitting: General Surgery

## 2024-01-04 ENCOUNTER — Encounter: Payer: Self-pay | Admitting: Internal Medicine

## 2024-01-04 NOTE — Anesthesia Postprocedure Evaluation (Signed)
 Anesthesia Post Note  Patient: Bruce Romero  Procedure(s) Performed: REPAIR, HERNIA, VENTRAL, ROBOT-ASSISTED     Patient location during evaluation: PACU Anesthesia Type: General Level of consciousness: awake and alert Pain management: pain level controlled Vital Signs Assessment: post-procedure vital signs reviewed and stable Respiratory status: spontaneous breathing, nonlabored ventilation, respiratory function stable and patient connected to nasal cannula oxygen Cardiovascular status: blood pressure returned to baseline and stable Postop Assessment: no apparent nausea or vomiting Anesthetic complications: no   No notable events documented.              Eamon Tantillo D Jennifier Smitherman

## 2024-01-11 ENCOUNTER — Telehealth: Admitting: Physician Assistant

## 2024-01-11 ENCOUNTER — Encounter: Payer: Self-pay | Admitting: Family Medicine

## 2024-01-11 DIAGNOSIS — B9689 Other specified bacterial agents as the cause of diseases classified elsewhere: Secondary | ICD-10-CM

## 2024-01-11 MED ORDER — AMOXICILLIN-POT CLAVULANATE 875-125 MG PO TABS: 1.0000 | ORAL_TABLET | Freq: Two times a day (BID) | ORAL | 0 refills | Status: DC

## 2024-01-11 NOTE — Progress Notes (Signed)

## 2024-01-15 ENCOUNTER — Telehealth: Admitting: Family Medicine

## 2024-01-15 ENCOUNTER — Encounter: Payer: Self-pay | Admitting: Family Medicine

## 2024-01-15 DIAGNOSIS — K529 Noninfective gastroenteritis and colitis, unspecified: Secondary | ICD-10-CM

## 2024-01-15 DIAGNOSIS — J0140 Acute pansinusitis, unspecified: Secondary | ICD-10-CM | POA: Diagnosis not present

## 2024-01-15 DIAGNOSIS — F411 Generalized anxiety disorder: Secondary | ICD-10-CM

## 2024-01-15 MED ORDER — ONDANSETRON 4 MG PO TBDP: 4.0000 mg | ORAL_TABLET | Freq: Three times a day (TID) | ORAL | 0 refills | Status: AC | PRN

## 2024-01-15 MED ORDER — ALPRAZOLAM 0.25 MG PO TABS: ORAL_TABLET | ORAL | 1 refills | Status: AC

## 2024-01-15 MED ORDER — PREDNISONE 10 MG PO TABS: ORAL_TABLET | ORAL | 0 refills | Status: AC

## 2024-01-15 NOTE — Progress Notes (Signed)
 MyChart Video Visit    Virtual Visit via Video Note   This patient is at least at moderate risk for complications without adequate follow up. This format is felt to be most appropriate for this patient at this time. Physical exam was limited by quality of the video and audio technology used for the visit. [facility] was able to get the patient set up on a video visit.  Patient location: home  Patient and provider in visit Provider location: Office  I discussed the limitations of evaluation and management by telemedicine and the availability of in person appointments. The patient expressed understanding and agreed to proceed.  Visit Date: 01/15/2024  Today's healthcare provider: Jamee JONELLE Antonio Cyndee, DO     Subjective:    Patient ID: Bruce Romero, male    DOB: [birth year], 78 y.o.   MRN: [id number]  No chief complaint on file.   HPI Patient is in today for cough, congestion and ears feel stopped.  Discussed the use of AI scribe software for clinical note transcription with the patient, who gave verbal consent to proceed.  History of Present Illness Bruce Romero is a 78 year old male who presents with congestion and ear blockage following recent hernia surgery.  He underwent hernia surgery on January 02, 2024, which proceeded without complications. Shortly after the surgery, he developed symptoms of head congestion and blocked ears.  During an e-visit, he was told he likely had sinusitis and was prescribed Augmentin , Flonase , saline mist, and Astelin  nose spray. Despite five days of treatment, his symptoms have not improved. He is using a cool mist vaporizer to alleviate symptoms and has Claritin  and Zyrtec available but has not used them.  He manages his blood sugar with Mounjaro , which is effective, though it causes nausea. Zofran  is helpful for this side effect. He also takes Zoloft  for depression, which has provided some relief, but he experiences insomnia due  to a racing mind. He uses Xanax , starting with 0.25 mg and occasionally taking an additional 0.5 mg, to aid sleep without morning grogginess.    Past Medical History:  Diagnosis Date   Acute medial meniscal tear, left, initial encounter    Anxiety    Arthritis    Diabetes mellitus    GERD (gastroesophageal reflux disease)    Hyperlipidemia    Hypertension    Irritable bowel    Spastic colon    anaphylaxis due to Keflex 1988    Past Surgical History:  Procedure Laterality Date   CHOLECYSTECTOMY     CHONDROPLASTY Left 01/15/2016   Procedure: CHONDROPLASTY;  Surgeon: Maude Herald, MD;  Location: [facility];  Service: Orthopedics;  Laterality: Left;   KNEE ARTHROSCOPY WITH MEDIAL MENISECTOMY Left 01/15/2016   Procedure: ARTHROSCOPY LEFT KNEE WITH PARTIAL MEDIAL MENISECTOMY;  Surgeon: Maude Herald, MD;  Location: [facility];  Service: Orthopedics;  Laterality: Left;   NASAL SINUS SURGERY     XI ROBOTIC ASSISTED VENTRAL HERNIA N/A 01/02/2024   Procedure: REPAIR, HERNIA, VENTRAL, ROBOT-ASSISTED;  Surgeon: Rubin Calamity, MD;  Location: [facility];  Service: General;  Laterality: N/A;  ROBOTIC VENTRAL HERNIA REPAIR WITH MESH    Family History  Problem Relation Age of Onset   Stroke Mother    Pulmonary embolism Father    Diabetes Brother     Social History   Socioeconomic History   Marital status: Married    Spouse name: Not on file   Number of children: Not on  file   Years of education: Not on file   Highest education level: Bachelor's degree (e.g., BA, AB, BS)  Occupational History   Occupation: retired  Tobacco Use   Smoking status: Former   Smokeless tobacco: Never  Advertising Account Planner   Vaping status: Never Used  Substance and Sexual Activity   Alcohol use: Yes    Alcohol/week: 0.0 standard drinks of alcohol    Comment: social   Drug use: No   Sexual activity: Yes  Other Topics Concern   Not on file  Social History Narrative   Exercise try  to everyday.   Social Drivers of Corporate Investment Banker Strain: Low Risk  (10/15/2023)   Overall Financial Resource Strain (CARDIA)    Difficulty of Paying Living Expenses: Not very hard  Food Insecurity: No Food Insecurity (10/15/2023)   Hunger Vital Sign    Worried About Running Out of Food in the Last Year: Never true    Ran Out of Food in the Last Year: Never true  Transportation Needs: No Transportation Needs (10/15/2023)   PRAPARE - Administrator, Civil Service (Medical): No    Lack of Transportation (Non-Medical): No  Physical Activity: Insufficiently Active (10/15/2023)   Exercise Vital Sign    Days of Exercise per Week: 2 days    Minutes of Exercise per Session: 10 min  Stress: Stress Concern Present (10/15/2023)   [employer] of Occupational Health - Occupational Stress Questionnaire    Feeling of Stress: Rather much  Social Connections: Moderately Isolated (10/15/2023)   Social Connection and Isolation Panel    Frequency of Communication with Friends and Family: Three times a week    Frequency of Social Gatherings with Friends and Family: Three times a week    Attends Religious Services: Patient declined    Active Member of Clubs or Organizations: No    Attends Engineer, Structural: Not on file    Marital Status: Married  Catering Manager Violence: Not on file    Outpatient Medications Prior to Visit  Medication Sig Dispense Refill   albuterol  (VENTOLIN  HFA) 108 (90 Base) MCG/ACT inhaler Inhale 2 puffs into the lungs every 6 (six) hours as needed for wheezing or shortness of breath. 8 g 0   amLODipine  (NORVASC ) 10 MG tablet Take 1 tablet (10 mg total) by mouth daily. 90 tablet 0   amoxicillin -clavulanate (AUGMENTIN ) 875-125 MG tablet Take 1 tablet by mouth 2 (two) times daily. 14 tablet 0   atorvastatin  (LIPITOR) 10 MG tablet Take 1 tablet (10 mg total) by mouth daily. 100 tablet 1   azelastine  (ASTELIN ) 0.1 % nasal spray PLACE 1 SPRAY IN EACH  NOSTRIL TWICE DAILY (Patient taking differently: Place 2 sprays into both nostrils 2 (two) times daily as needed for allergies.) 30 mL 2   Blood Glucose Monitoring Suppl (ACCU-CHEK GUIDE) w/Device KIT Use as advised 1 kit 0   candesartan  (ATACAND ) 16 MG tablet TAKE 1 TABLET(16 MG) BY MOUTH AT BEDTIME (Patient taking differently: Take 16 mg by mouth daily.) 90 tablet 1   celecoxib  (CELEBREX ) 100 MG capsule Take 1 capsule (100 mg total) by mouth 2 (two) times daily. 180 capsule 1   chlorpheniramine-HYDROcodone  (TUSSIONEX) 10-8 MG/5ML Take 5 mLs by mouth at bedtime as needed for cough. 115 mL 0   Cholecalciferol (VITAMIN D ) 2000 UNITS tablet Take 2,000 Units by mouth daily.     COENZYME Q10 PO Take 500 mg by mouth daily. (Patient not taking: Reported on 12/27/2023)  erythromycin ophthalmic ointment Place 1 Application into both eyes daily as needed (blepharitis).     esomeprazole  (NEXIUM ) 40 MG capsule TAKE 1 CAPSULE(40 MG) BY MOUTH DAILY 90 capsule 1   fluticasone  (FLONASE ) 50 MCG/ACT nasal spray Place 1 spray into both nostrils daily as needed for allergies.     glucose blood (ACCU-CHEK GUIDE TEST) test strip Use as instructed 100 each 12   insulin  lispro (HUMALOG  KWIKPEN) 100 UNIT/ML KwikPen Max daily 15 units 15 mL 3   Insulin  Pen Needle 32G X 4 MM MISC 1 Device by Does not apply route 3 (three) times daily. 90 each 3   Insulin  Pen Needle 32G X 6 MM MISC Use as directed with insulin  pen 30 each 3   KRILL OIL PO Take 1 tablet by mouth daily.     LINZESS  290 MCG CAPS capsule TAKE 1 CAPSULE(290 MCG) BY MOUTH DAILY BEFORE BREAKFAST 90 capsule 3   metFORMIN  (GLUCOPHAGE -XR) 750 MG 24 hr tablet Take 1 tablet (750 mg total) by mouth 2 (two) times daily. 180 tablet 3   montelukast  (SINGULAIR ) 10 MG tablet Take 1 tablet (10 mg total) by mouth at bedtime. 90 tablet 1   niacin  (SLO-NIACIN ) 500 MG tablet Take 500 mg by mouth at bedtime.     nystatin cream (MYCOSTATIN) Apply 1 Application topically daily as  needed for dry skin.     ofloxacin  (FLOXIN  OTIC) 0.3 % OTIC solution Place 10 drops into the right ear daily. (Patient not taking: Reported on 12/25/2023) 5 mL 0   sertraline  (ZOLOFT ) 50 MG tablet Take 1 tablet (50 mg total) by mouth daily. 90 tablet 3   tamsulosin  (FLOMAX ) 0.4 MG CAPS capsule TAKE 1 CAPSULE(0.4 MG) BY MOUTH DAILY 90 capsule 3   tirzepatide  (MOUNJARO ) 7.5 MG/0.5ML Pen Inject 7.5 mg into the skin once a week. (Patient taking differently: Inject 7.5 mg into the skin once a week. Takes on Tuesdays) 6 mL 2   traMADol  (ULTRAM ) 50 MG tablet Take 1 tablet (50 mg total) by mouth every 6 (six) hours as needed. 20 tablet 0   ALPRAZolam  (XANAX ) 0.25 MG tablet TAKE 1 TABLET BY MOUTH THREE TIMES DAILY AS NEEDED 90 tablet 1   ondansetron  (ZOFRAN -ODT) 4 MG disintegrating tablet Take 1 tablet (4 mg total) by mouth every 8 (eight) hours as needed for nausea or vomiting. 20 tablet 0   No facility-administered medications prior to visit.    Allergies  Allergen Reactions   Cephalexin Anaphylaxis    REACTION: pt is not allergic to penicillin   Bactrim [Sulfamethoxazole -Trimethoprim] Swelling    ? Took Bactrim and Flexeril  at same time had lip swelling unsure which caused reaction.   Bromocriptine  Nausea And Vomiting   Flexeril  [Cyclobenzaprine ] Swelling   Invokana  [Canagliflozin ]     balanitis    Review of Systems  Constitutional:  Negative for fever and malaise/fatigue.  HENT:  Positive for congestion and sinus pain.   Eyes:  Negative for blurred vision.  Respiratory:  Negative for cough and shortness of breath.   Cardiovascular:  Negative for chest pain, palpitations and leg swelling.  Gastrointestinal:  Negative for abdominal pain, blood in stool, nausea and vomiting.  Genitourinary:  Negative for dysuria and frequency.  Musculoskeletal:  Negative for back pain and falls.  Skin:  Negative for rash.  Neurological:  Negative for dizziness, loss of consciousness and headaches.   Endo/Heme/Allergies:  Negative for environmental allergies.  Psychiatric/Behavioral:  Negative for depression. The patient is not nervous/anxious.  Objective:    Physical Exam Vitals and nursing note reviewed.  Constitutional:      Appearance: Normal appearance.  Pulmonary:     Effort: Pulmonary effort is normal.  Neurological:     Mental Status: He is alert.  Psychiatric:        Speech: Speech normal.        Behavior: Behavior normal.        Thought Content: Thought content normal.        Cognition and Memory: Cognition and memory normal.        Judgment: Judgment normal.     There were no vitals taken for this visit. Wt Readings from Last 3 Encounters:  01/02/24 172 lb (78 kg)  12/27/23 172 lb 11.2 oz (78.3 kg)  12/12/23 177 lb (80.3 kg)       Assessment & Plan:  Acute non-recurrent pansinusitis -     predniSONE ; TAKE 3 TABLETS PO QD FOR 3 DAYS THEN TAKE 2 TABLETS PO QD FOR 3 DAYS THEN TAKE 1 TABLET PO QD FOR 3 DAYS THEN TAKE 1/2 TAB PO QD FOR 3 DAYS  Dispense: 20 tablet; Refill: 0  Generalized anxiety disorder -     ALPRAZolam ; TAKE 1 TABLET BY MOUTH THREE TIMES DAILY AS NEEDED  Dispense: 90 tablet; Refill: 1  Gastroenteritis -     Ondansetron ; Take 1 tablet (4 mg total) by mouth every 8 (eight) hours as needed for nausea or vomiting.  Dispense: 20 tablet; Refill: 0  Assessment and Plan Assessment & Plan Acute pansinusitis   He has experienced persistent congestion and ear fullness since November 25th, 2025, following hernia surgery. Currently on day five of Augmentin , Flonase , saline mist, and Astelin  nasal spray. Symptoms suggest a viral cause despite bacterial treatment. Prednisone  is prescribed to alleviate symptoms and should be picked up at the pharmacy. Consider systemic antihistamines like Claritin  or Zyrtec if needed.  Generalized anxiety disorder   He has difficulty falling asleep due to racing thoughts, likely stress-related. Currently using Xanax   0.25 mg, sometimes requiring an additional 0.25 mg for effectiveness. No grogginess reported the next morning. Xanax  is prescribed with instructions to use as needed for sleep.  Major depressive disorder   Zoloft  has been somewhat effective in managing depression. No current issues reported with Zoloft . Continue Zoloft  as prescribed.    I discussed the assessment and treatment plan with the patient. The patient was provided an opportunity to ask questions and all were answered. The patient agreed with the plan and demonstrated an understanding of the instructions.   The patient was advised to call back or seek an in-person evaluation if the symptoms worsen or if the condition fails to improve as anticipated.  Jamee JONELLE Antonio Cyndee, DO [facility] at [facility] [phone] (phone) [phone] (fax)  [facility]

## 2024-01-20 ENCOUNTER — Other Ambulatory Visit (HOSPITAL_COMMUNITY): Payer: Self-pay

## 2024-01-20 ENCOUNTER — Other Ambulatory Visit: Payer: Self-pay | Admitting: Family Medicine

## 2024-01-20 DIAGNOSIS — I10 Essential (primary) hypertension: Secondary | ICD-10-CM

## 2024-01-24 ENCOUNTER — Other Ambulatory Visit: Payer: Self-pay

## 2024-01-24 ENCOUNTER — Other Ambulatory Visit: Payer: Self-pay | Admitting: Family Medicine

## 2024-01-24 MED ORDER — ATORVASTATIN CALCIUM 10 MG PO TABS: 10.0000 mg | ORAL_TABLET | Freq: Every day | ORAL | 1 refills | Status: AC

## 2024-01-31 ENCOUNTER — Other Ambulatory Visit: Payer: Self-pay

## 2024-01-31 ENCOUNTER — Other Ambulatory Visit (HOSPITAL_COMMUNITY): Payer: Self-pay

## 2024-02-27 ENCOUNTER — Encounter: Payer: Self-pay | Admitting: Internal Medicine

## 2024-02-27 ENCOUNTER — Ambulatory Visit: Admitting: Internal Medicine

## 2024-02-27 VITALS — BP 102/64 | Ht 69.0 in | Wt 166.0 lb

## 2024-02-27 DIAGNOSIS — Z7985 Long-term (current) use of injectable non-insulin antidiabetic drugs: Secondary | ICD-10-CM | POA: Diagnosis not present

## 2024-02-27 DIAGNOSIS — Z7984 Long term (current) use of oral hypoglycemic drugs: Secondary | ICD-10-CM | POA: Diagnosis not present

## 2024-02-27 DIAGNOSIS — E119 Type 2 diabetes mellitus without complications: Secondary | ICD-10-CM | POA: Diagnosis not present

## 2024-02-27 LAB — POCT GLYCOSYLATED HEMOGLOBIN (HGB A1C): Hemoglobin A1C: 6.2 % — AB (ref 4.0–5.6)

## 2024-02-27 MED ORDER — TIRZEPATIDE 5 MG/0.5ML ~~LOC~~ SOAJ: 5.0000 mg | SUBCUTANEOUS | 3 refills | Status: AC

## 2024-02-27 NOTE — Progress Notes (Signed)
 "   Name: Bruce Romero  Age/ Sex: 79 y.o., male   MRN/ DOB: [id number], [birth year]     PCP: Antonio Cyndee Jamee JONELLE, DO   Reason for Endocrinology Evaluation: Type 2 Diabetes Mellitus  Initial Endocrine Consultative Visit: 04/26/2016    PATIENT IDENTIFIER: Bruce Romero is a 79 y.o. male with a past medical history of T2DM, HTN, fibromyalgia. The patient has followed with Endocrinology clinic since 04/26/2016 for consultative assistance with management of his diabetes.  DIABETIC HISTORY:  Bruce Romero was diagnosed with DM 2006, intolerant to Invokana -balanitis, intolerant to Ozempic -GI side effects. His hemoglobin A1c has ranged from 6.2% in 2018, peaking at 7.8% in 2023.   He was followed by Dr. Kassie between 2018 and April 2023   Switch Trulicity  to Mounjaro  10/2021 , no intolerance to Trulicity   He developed intolerance to Mounjaro  7.5 mg dose 12/2022 But the patient requested to go back up to Mounjaro  7.5 mg in September, 2025 based on his PCPs recommendations  SUBJECTIVE:   During the last visit (10/25/2023): A1c 7.2%  Today (02/27/2024): Bruce Romero is here for follow-up on diabetes management He checks his blood sugars 2-3 times week.   Patient underwent ventral hernia repair in November, 2025  He did require prednisone  in December, 2025 for a sinus infection.  He did use humalog  per sliding scale  The patient has been noted with weight loss of approximately 17 LB since his last visit here He eats 2 meals a day ( Breakfast and Dinner)  His chronic constipation has been improving and not having to use Linzess  as much    HOME DIABETES REGIMEN:  Metformin  750 XR twice daily Mounjaro  7.5 mg weekly     Statin: Yes ACE-I/ARB :yes     METER DOWNLOAD SUMMARY: Did not bring     DIABETIC COMPLICATIONS: Microvascular complications:   Denies: CKD, retinopathy, neuropathy Last Eye Exam: Completed   Macrovascular complications:   Denies: CAD, CVA,  PVD   HISTORY:  Past Medical History:  Past Medical History:  Diagnosis Date   Acute medial meniscal tear, left, initial encounter    Anxiety    Arthritis    Diabetes mellitus    GERD (gastroesophageal reflux disease)    Hyperlipidemia    Hypertension    Irritable bowel    Spastic colon    anaphylaxis due to Keflex 1988   Past Surgical History:  Past Surgical History:  Procedure Laterality Date   CHOLECYSTECTOMY     CHONDROPLASTY Left 01/15/2016   Procedure: CHONDROPLASTY;  Surgeon: Maude Herald, MD;  Location: [facility];  Service: Orthopedics;  Laterality: Left;   KNEE ARTHROSCOPY WITH MEDIAL MENISECTOMY Left 01/15/2016   Procedure: ARTHROSCOPY LEFT KNEE WITH PARTIAL MEDIAL MENISECTOMY;  Surgeon: Maude Herald, MD;  Location: [facility];  Service: Orthopedics;  Laterality: Left;   NASAL SINUS SURGERY     XI ROBOTIC ASSISTED VENTRAL HERNIA N/A 01/02/2024   Procedure: REPAIR, HERNIA, VENTRAL, ROBOT-ASSISTED;  Surgeon: Rubin Calamity, MD;  Location: [facility];  Service: General;  Laterality: N/A;  ROBOTIC VENTRAL HERNIA REPAIR WITH MESH   Social History:  reports that he has quit smoking. He has never used smokeless tobacco. He reports current alcohol use. He reports that he does not use drugs. Family History:  Family History  Problem Relation Age of Onset   Stroke Mother    Pulmonary embolism Father    Diabetes Brother      HOME MEDICATIONS: Allergies as of  02/27/2024       Reactions   Cephalexin Anaphylaxis   REACTION: pt is not allergic to penicillin   Bactrim [sulfamethoxazole -trimethoprim] Swelling   ? Took Bactrim and Flexeril  at same time had lip swelling unsure which caused reaction.   Bromocriptine  Nausea And Vomiting   Flexeril  [cyclobenzaprine ] Swelling   Invokana  [canagliflozin ]    balanitis        Medication List        Accurate as of February 27, 2024 11:15 AM. If you have any questions, ask your nurse or doctor.           STOP taking these medications    amoxicillin -clavulanate 875-125 MG tablet Commonly known as: AUGMENTIN  Stopped by: Donell Butts, MD   COENZYME Q10 PO Stopped by: Donell Butts, MD   ofloxacin  0.3 % OTIC solution Commonly known as: Floxin  Otic Stopped by: Donell Butts, MD       TAKE these medications    Accu-Chek Guide Test test strip Generic drug: glucose blood Use as instructed   Accu-Chek Guide w/Device Kit Use as advised   albuterol  108 (90 Base) MCG/ACT inhaler Commonly known as: VENTOLIN  HFA Inhale 2 puffs into the lungs every 6 (six) hours as needed for wheezing or shortness of breath.   ALPRAZolam  0.25 MG tablet Commonly known as: XANAX  TAKE 1 TABLET BY MOUTH THREE TIMES DAILY AS NEEDED   amLODipine  10 MG tablet Commonly known as: NORVASC  Take 1 tablet (10 mg total) by mouth daily.   atorvastatin  10 MG tablet Commonly known as: LIPITOR Take 1 tablet (10 mg total) by mouth daily.   azelastine  0.1 % nasal spray Commonly known as: ASTELIN  PLACE 1 SPRAY IN EACH NOSTRIL TWICE DAILY What changed: See the new instructions.   candesartan  16 MG tablet Commonly known as: ATACAND  TAKE 1 TABLET(16 MG) BY MOUTH AT BEDTIME What changed: See the new instructions.   celecoxib  100 MG capsule Commonly known as: CELEBREX  Take 1 capsule (100 mg total) by mouth 2 (two) times daily.   chlorpheniramine-HYDROcodone  10-8 MG/5ML Commonly known as: TUSSIONEX Take 5 mLs by mouth at bedtime as needed for cough.   erythromycin ophthalmic ointment Place 1 Application into both eyes daily as needed (blepharitis).   esomeprazole  40 MG capsule Commonly known as: NEXIUM  TAKE 1 CAPSULE(40 MG) BY MOUTH DAILY   fluticasone  50 MCG/ACT nasal spray Commonly known as: FLONASE  Place 1 spray into both nostrils daily as needed for allergies.   insulin  lispro 100 UNIT/ML KwikPen Commonly known as: HumaLOG  KwikPen Max daily 15 units   Insulin  Pen Needle  32G X 6 MM Misc Use as directed with insulin  pen   Insulin  Pen Needle 32G X 4 MM Misc 1 Device by Does not apply route 3 (three) times daily.   KRILL OIL PO Take 1 tablet by mouth daily.   Linzess  290 MCG Caps capsule Generic drug: linaclotide  TAKE 1 CAPSULE(290 MCG) BY MOUTH DAILY BEFORE BREAKFAST   metFORMIN  750 MG 24 hr tablet Commonly known as: GLUCOPHAGE -XR Take 1 tablet (750 mg total) by mouth 2 (two) times daily.   montelukast  10 MG tablet Commonly known as: SINGULAIR  Take 1 tablet (10 mg total) by mouth at bedtime.   niacin  500 MG tablet Commonly known as: (VITAMIN B3) Take 500 mg by mouth at bedtime.   nystatin cream Commonly known as: MYCOSTATIN Apply 1 Application topically daily as needed for dry skin.   ondansetron  4 MG disintegrating tablet Commonly known as: ZOFRAN -ODT Take 1 tablet (4 mg  total) by mouth every 8 (eight) hours as needed for nausea or vomiting.   predniSONE  10 MG tablet Commonly known as: DELTASONE  TAKE 3 TABLETS PO QD FOR 3 DAYS THEN TAKE 2 TABLETS PO QD FOR 3 DAYS THEN TAKE 1 TABLET PO QD FOR 3 DAYS THEN TAKE 1/2 TAB PO QD FOR 3 DAYS   sertraline  50 MG tablet Commonly known as: ZOLOFT  Take 1 tablet (50 mg total) by mouth daily.   tamsulosin  0.4 MG Caps capsule Commonly known as: FLOMAX  TAKE 1 CAPSULE(0.4 MG) BY MOUTH DAILY   tirzepatide  7.5 MG/0.5ML Pen Commonly known as: MOUNJARO  Inject 7.5 mg into the skin once a week. What changed: additional instructions   traMADol  50 MG tablet Commonly known as: Ultram  Take 1 tablet (50 mg total) by mouth every 6 (six) hours as needed.   Vitamin D  50 MCG (2000 UT) tablet Take 2,000 Units by mouth daily.         OBJECTIVE:   Vital Signs: BP 102/64   Ht 5' 9 (1.753 m)   Wt 166 lb (75.3 kg)   BMI 24.51 kg/m   Wt Readings from Last 3 Encounters:  02/27/24 166 lb (75.3 kg)  01/02/24 172 lb (78 kg)  12/27/23 172 lb 11.2 oz (78.3 kg)     Exam: General: Pt appears well and is  in NAD  Lungs: Clear with good BS bilat   Heart: RRR   Extremities: No pretibial edema.   Neuro: MS is good with appropriate affect, pt is alert and Ox3    DM foot exam: 10/25/2023  The skin of the feet is intact without sores or ulcerations. The pedal pulses are 2+ on right and 2+ on left. The sensation is intact to a screening 5.07, 10 gram monofilament bilaterally        DATA REVIEWED:  Lab Results  Component Value Date   HGBA1C 6.2 (H) 12/27/2023   HGBA1C 7.2 (H) 09/22/2023   HGBA1C 6.9 (A) 04/19/2023    Latest Reference Range & Units 12/12/23 15:52  Sodium 135 - 145 mEq/L 135  Potassium 3.5 - 5.1 mEq/L 4.4  Chloride 96 - 112 mEq/L 98  CO2 19 - 32 mEq/L 26  Glucose 70 - 99 mg/dL 885 (H)  BUN 6 - 23 mg/dL 22  Creatinine 9.59 - 8.49 mg/dL 8.98  Calcium  8.4 - 10.5 mg/dL 9.1  Alkaline Phosphatase 39 - 117 U/L 40  Albumin  3.5 - 5.2 g/dL 4.4  AST 0 - 37 U/L 23  ALT 0 - 53 U/L 19  Total Protein 6.0 - 8.3 g/dL 6.7  Total Bilirubin 0.2 - 1.2 mg/dL 0.6  GFR >39.99 mL/min 70.81    Latest Reference Range & Units 12/12/23 15:52  Total CHOL/HDL Ratio  2  Cholesterol 0 - 200 mg/dL 896  HDL Cholesterol >60.99 mg/dL 51.59  LDL (calc) 0 - 99 mg/dL 28  NonHDL  45.15  Triglycerides 0.0 - 149.0 mg/dL 865.9  VLDL 0.0 - 59.9 mg/dL 73.1    Latest Reference Range & Units 09/22/23 14:46  Microalb, Ur mg/dL 0.2  MICROALB/CREAT RATIO <30 mg/g creat 4  Creatinine, Urine 20 - 320 mg/dL 54      Old records , labs and images have been reviewed.    ASSESSMENT / PLAN / RECOMMENDATIONS:   1) Type 2 Diabetes Mellitus, Optimally controlled, Without complications - Most recent A1c of 6.2 %. Goal A1c < 7.0 %.     -A1c has trended down to 6.2%, with a 17  LB weight loss over the past few months, I have recommended decreasing Mounjaro  as below - Intolerant to Ozempic  2 mg weekly  - We will continue current dose of metformin  at this time - Encouraged weightbearing  exercises  MEDICATIONS:  Continue metformin  750 mg XR, twice daily Decrease Mounjaro  5 mg weekly  EDUCATION / INSTRUCTIONS: BG monitoring instructions: Patient is instructed to check his blood sugars 1 times a day, fasting .    2) Diabetic complications:  Eye: Does not have known diabetic retinopathy.  Neuro/ Feet: Does not have known diabetic peripheral neuropathy .  Renal: Patient does not have known baseline CKD. He   is  on an ACEI/ARB at present.    F/U in 4 months     Signed electronically by: Stefano Redgie Butts, MD  [facility] End[facility]  [facility] [address] [place], [place] [address] Phone: [phone] FAX: [phone]   CC: Antonio Cyndee Jamee JONELLE ROSALEA [address] [place] [address] Phone: [phone]  Fax: [phone]  Return to Endocrinology clinic as below: Future Appointments  Date Time Provider Department Center  02/27/2024 11:30 AM Lanay Zinda, Donell Redgie, MD [id number] None     He questions "

## 2024-02-27 NOTE — Patient Instructions (Signed)
 Continue Metformin  750 XR twice daily Decrease  Mounjaro  5 mg weekly    HOW TO TREAT LOW BLOOD SUGARS (Blood sugar LESS THAN 70 MG/DL) Please follow the RULE OF 15 for the treatment of hypoglycemia treatment (when your (blood sugars are less than 70 mg/dL)   STEP 1: Take 15 grams of carbohydrates when your blood sugar is low, which includes:  3-4 GLUCOSE TABS  OR 3-4 OZ OF JUICE OR REGULAR SODA OR ONE TUBE OF GLUCOSE GEL    STEP 2: RECHECK blood sugar in 15 MINUTES STEP 3: If your blood sugar is still low at the 15 minute recheck --> then, go back to STEP 1 and treat AGAIN with another 15 grams of carbohydrates.

## 2024-03-03 ENCOUNTER — Telehealth: Admitting: Family

## 2024-03-03 DIAGNOSIS — J019 Acute sinusitis, unspecified: Secondary | ICD-10-CM

## 2024-03-03 MED ORDER — DOXYCYCLINE HYCLATE 100 MG PO TABS: 100.0000 mg | ORAL_TABLET | Freq: Two times a day (BID) | ORAL | 0 refills | Status: AC

## 2024-03-03 NOTE — Progress Notes (Signed)
 E-Visit for Sinus Problems  We are sorry that you are not feeling well.  Here is how we plan to help!  Based on what you have shared with me it looks like you have sinusitis.  Sinusitis is inflammation and infection in the sinus cavities of the head.  Based on your presentation I believe you most likely have Acute Bacterial Sinusitis.  This is an infection caused by bacteria and is treated with antibiotics. I have prescribed Doxycycline  100mg  by mouth twice a day for 7 days. You may use an oral decongestant such as Mucinex D or if you have glaucoma or high blood pressure use plain Mucinex. Saline nasal spray help and can safely be used as often as needed for congestion.  If you develop worsening sinus pain, fever or notice severe headache and vision changes, or if symptoms are not better after completion of antibiotic, please schedule an appointment with a health care provider.    Sinus infections are not as easily transmitted as other respiratory infection, however we still recommend that you avoid close contact with loved ones, especially the very young and elderly.  Remember to wash your hands thoroughly throughout the day as this is the number one way to prevent the spread of infection!  Home Care: Only take medications as instructed by your medical team. Complete the entire course of an antibiotic. Do not take these medications with alcohol. A steam or ultrasonic humidifier can help congestion.  You can place a towel over your head and breathe in the steam from hot water coming from a faucet. Avoid close contacts especially the very young and the elderly. Cover your mouth when you cough or sneeze. Always remember to wash your hands.  Get Help Right Away If: You develop worsening fever or sinus pain. You develop a severe head ache or visual changes. Your symptoms persist after you have completed your treatment plan.  Make sure you Understand these instructions. Will watch your  condition. Will get help right away if you are not doing well or get worse.  Your e-visit answers were reviewed by a board certified advanced clinical practitioner to complete your personal care plan.  Depending on the condition, your plan could have included both over the counter or prescription medications.  If there is a problem please reply  once you have received a response from your provider.  Your safety is important to us .  If you have drug allergies check your prescription carefully.    You can use MyChart to ask questions about today's visit, request a non-urgent call back, or ask for a work or school excuse for 24 hours related to this e-Visit. If it has been greater than 24 hours you will need to follow up with your provider, or enter a new e-Visit to address those concerns.  You will get an e-mail in the next two days asking about your experience.  I hope that your e-visit has been valuable and will speed your recovery. Thank you for using e-visits.  I have spent 5 minutes in review of e-visit questionnaire, review and updating patient chart, medical decision making and response to patient.   Bari Learn, FNP

## 2024-06-27 ENCOUNTER — Ambulatory Visit: Admitting: Internal Medicine
